# Patient Record
Sex: Male | Born: 1935 | Race: White | Hispanic: No | Marital: Married | Smoking: Former smoker
Health system: Southern US, Community
[De-identification: ages and names within clinical notes are randomized; demographics above are authoritative.]

## PROBLEM LIST (undated history)

## (undated) DIAGNOSIS — I499 Cardiac arrhythmia, unspecified: Secondary | ICD-10-CM

## (undated) DIAGNOSIS — N189 Chronic kidney disease, unspecified: Secondary | ICD-10-CM

## (undated) DIAGNOSIS — E119 Type 2 diabetes mellitus without complications: Secondary | ICD-10-CM

## (undated) DIAGNOSIS — Q2112 Patent foramen ovale: Secondary | ICD-10-CM

## (undated) DIAGNOSIS — J449 Chronic obstructive pulmonary disease, unspecified: Secondary | ICD-10-CM

## (undated) DIAGNOSIS — I517 Cardiomegaly: Secondary | ICD-10-CM

## (undated) DIAGNOSIS — E7800 Pure hypercholesterolemia, unspecified: Secondary | ICD-10-CM

## (undated) DIAGNOSIS — Q211 Atrial septal defect: Secondary | ICD-10-CM

## (undated) DIAGNOSIS — J439 Emphysema, unspecified: Secondary | ICD-10-CM

## (undated) DIAGNOSIS — I10 Essential (primary) hypertension: Secondary | ICD-10-CM

## (undated) DIAGNOSIS — C3490 Malignant neoplasm of unspecified part of unspecified bronchus or lung: Secondary | ICD-10-CM

## (undated) DIAGNOSIS — D649 Anemia, unspecified: Secondary | ICD-10-CM

## (undated) DIAGNOSIS — I2510 Atherosclerotic heart disease of native coronary artery without angina pectoris: Secondary | ICD-10-CM

## (undated) DIAGNOSIS — Z923 Personal history of irradiation: Secondary | ICD-10-CM

## (undated) DIAGNOSIS — K529 Noninfective gastroenteritis and colitis, unspecified: Secondary | ICD-10-CM

## (undated) DIAGNOSIS — N400 Enlarged prostate without lower urinary tract symptoms: Secondary | ICD-10-CM

## (undated) HISTORY — PX: CORONARY ANGIOPLASTY WITH STENT PLACEMENT: SHX49

## (undated) HISTORY — PX: CARDIAC CATHETERIZATION: SHX172

## (undated) HISTORY — DX: Pure hypercholesterolemia, unspecified: E78.00

## (undated) HISTORY — DX: Noninfective gastroenteritis and colitis, unspecified: K52.9

## (undated) HISTORY — DX: Personal history of irradiation: Z92.3

## (undated) HISTORY — PX: LUNG BIOPSY: SHX232

## (undated) HISTORY — DX: Chronic kidney disease, unspecified: N18.9

## (undated) HISTORY — DX: Emphysema, unspecified: J43.9

## (undated) HISTORY — DX: Chronic obstructive pulmonary disease, unspecified: J44.9

## (undated) HISTORY — PX: HEMORRHOID SURGERY: SHX153

## (undated) HISTORY — DX: Benign prostatic hyperplasia without lower urinary tract symptoms: N40.0

## (undated) HISTORY — DX: Essential (primary) hypertension: I10

---

## 1997-12-18 ENCOUNTER — Other Ambulatory Visit: Admission: RE | Admit: 1997-12-18 | Discharge: 1997-12-18 | Payer: Self-pay | Admitting: Family Medicine

## 2012-06-28 ENCOUNTER — Other Ambulatory Visit: Payer: Self-pay

## 2012-06-28 ENCOUNTER — Institutional Professional Consult (permissible substitution) (INDEPENDENT_AMBULATORY_CARE_PROVIDER_SITE_OTHER): Payer: PRIVATE HEALTH INSURANCE | Admitting: Cardiothoracic Surgery

## 2012-06-28 VITALS — BP 143/80 | HR 88 | Resp 20 | Ht 70.0 in | Wt 180.0 lb

## 2012-06-28 DIAGNOSIS — E1159 Type 2 diabetes mellitus with other circulatory complications: Secondary | ICD-10-CM | POA: Insufficient documentation

## 2012-06-28 DIAGNOSIS — C3490 Malignant neoplasm of unspecified part of unspecified bronchus or lung: Secondary | ICD-10-CM | POA: Insufficient documentation

## 2012-06-28 DIAGNOSIS — I119 Hypertensive heart disease without heart failure: Secondary | ICD-10-CM | POA: Insufficient documentation

## 2012-06-28 DIAGNOSIS — E7800 Pure hypercholesterolemia, unspecified: Secondary | ICD-10-CM | POA: Insufficient documentation

## 2012-06-28 DIAGNOSIS — J439 Emphysema, unspecified: Secondary | ICD-10-CM | POA: Insufficient documentation

## 2012-06-28 DIAGNOSIS — D381 Neoplasm of uncertain behavior of trachea, bronchus and lung: Secondary | ICD-10-CM

## 2012-06-28 DIAGNOSIS — N400 Enlarged prostate without lower urinary tract symptoms: Secondary | ICD-10-CM | POA: Insufficient documentation

## 2012-06-28 NOTE — Progress Notes (Signed)
[address]            [place] [address]          [phone]      [facility]andon [facility] Medical Record #[id number] Date of Birth: 1934-11-20  Referring: Weston Settle, MD Primary Care: [name], MD  Chief Complaint:    Chief Complaint  Patient presents with  . Lung Lesion    Referral from Dr Melvyn Neth for surgical eval on Left Upper Lobe lung nodule    History of Present Illness:    Patient is a 78 year old male referred by Dr. 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 On Christmas Day the patient while picking up sticks in his front yard had some onset of shortness of breath and fatigue and was taken to the [facility] emergency room. He was hospitalized for a week diagnosed with pneumonia, in addition a new left upper lobe lung mass was appreciated further workup was performed including a needle biopsy confirming squamous cell carcinoma. Ultimately the patient was discharged home on home oxygen and with respiratory treatments. He notes he is markedly improved at this point no longer using oxygen. He is referred for consideration of surgical resection.     Current Activity/ Functional Status: Patient is independent with mobility/ambulation, transfers, ADL's, IADL's. Zubron 0  Past Medical History  Diagnosis Date  . Hypertension   . Hypercholesterolemia   . Diabetes   . Benign prostatic hypertrophy   . Emphysema   . Lung nodule   . COPD (chronic obstructive pulmonary disease)   . CKD (chronic kidney disease)   . Colitis     Past Surgical History  Procedure Date  . Hemorrhoid surgery     Family History  Problem Relation Age of Onset  . Stroke Mother   . Heart disease Father     died of heart attack  . Lung cancer Brother   . Hypertension Brother   . Hypothyroidism Brother   . Hyperlipidemia Sister   . Hyperlipidemia Sister   . Breast  cancer Sister     History   Social History  . Marital Status: Married    Spouse Name: N/A    Number of Children: 1  . Years of Education: N/A   Occupational History  . retired [employer] for over 20 years  . retired     Personnel officer for 10 years   Social History Main Topics  . Smoking status: Former Smoker -- 0.5 packs/day for 50 years    Types: Cigarettes    Start date: 06/09/1958    Quit date: 06/10/2007  . Smokeless tobacco: Never Used  . Alcohol Use: No  . Drug Use: No  . Sexually Active: Not on file      History  Smoking status  . Former Smoker -- 0.5 packs/day for 50 years  . Types: Cigarettes  . Start date: 06/09/1958  . Quit date: 06/10/2007  Smokeless tobacco  . Never Used    History  Alcohol Use No     No Known Allergies  Current Outpatient Prescriptions  Medication Sig Dispense Refill  . carvedilol (COREG) 25 MG tablet Take 25 mg by mouth 2 (two) times daily with a meal.       . finasteride (PROSCAR) 5 MG tablet Take  5 mg by mouth daily.       Marland Kitchen lisinopril (PRINIVIL,ZESTRIL) 20 MG tablet Take 20 mg by mouth daily.       . metFORMIN (GLUCOPHAGE-XR) 500 MG 24 hr tablet Take 1,000 mg by mouth 2 (two) times daily.       . Tamsulosin HCl (FLOMAX) 0.4 MG CAPS 0.4 mg daily.            Review of Systems:     Cardiac Review of Systems: Y or N  Chest Pain [ vague not sure    ]  Resting SOB [ n  ] Exertional SOB  Cove.Etienne  ]  Orthopnea [ n ]   Pedal Edema [ y  ]    Palpitations Cove.Etienne  ] Syncope  [ n ]   Presyncope [ n  ]  General Review of Systems: [Y] = yes [  ]=no Constitional: recent weight change Milo.Brash  ]; anorexia [ n ]; fatigue Cove.Etienne  ]; nausea [n  ]; night sweats [n  ]; fever [ n ]; or chills [ n ];                                                                                                                                          Dental: poor dentition[ dentures ];   Eye : blurred vision [  ]; diplopia [   ]; vision changes [   ];  Amaurosis fugax[  ]; Resp: cough [ y ];  wheezing[y  ];  hemoptysis[n  ]; shortness of breath[y  ]; paroxysmal nocturnal dyspnea[  ]; dyspnea on exertion[ y ]; or orthopnea[  ];  GI:  gallstones[  ], vomiting[  ];  dysphagia[  ]; melena[  ];  hematochezia [  ]; heartburn[  ];   Hx of  Colonoscopy[y  ]; GU: kidney stones [  ]; hematuria[  ];   dysuria [n  ];  nocturia[  ];  history of     obstruction [  ];             Skin: rash, swelling[  ];, hair loss[  ];  peripheral edema[  ];  or itching[  ]; Musculosketetal: myalgias[  ];  joint swelling[  ];  joint erythema[  ];  joint pain[  ];  back pain[  ];  Heme/Lymph: bruising[  ];  bleeding[  ];  anemia[  ];  Neuro: TIA[  ];  headaches[  ];  stroke[  ];  vertigo[  ];  seizures[  ];   paresthesias[  ];  difficulty walking[  ];  Psych:depression[  ]; anxiety[  ];  Endocrine: diabetes[ y ];  thyroid dysfunction[  ];  Immunizations: Flu [ y ]; Pneumococcal[y  ];  Other:  Physical Exam: BP 143/80  Pulse 88  Resp 20  Ht 5\' 10"  (1.778 m)  Wt 180 lb (81.647 kg)  BMI  25.83 kg/m2  SpO2 91%  General appearance: alert, cooperative, appears stated age and no distress Neurologic: intact Heart: regular rate and rhythm, S1, S2 normal, no murmur, click, rub or gallop and normal apical impulse Lungs: diminished breath sounds bibasilar Abdomen: soft, non-tender; bowel sounds normal; no masses,  no organomegaly Extremities: edema 2+ pedal edema in both ankles, patient notes its worst and in the afternoons Do not appreciate any cervical or supraclavicular adenopathy he has no axillary adenopathy   Diagnostic Studies & Laboratory data:     Recent Radiology Findings:  Ct of chest , pet  , ct of head done at [facility] in [place] [facility] PET scan shows a 3.7 cm mass with an SUV of 9.7 in the left upper lobe abutting the pleura there is no evidence of hypermetabolic mediastinal nodes or other metastasis both on the PET scan and CT scan there is prominent  coronary calcifications also gallstones are present.  The scar area in the right upper lobe was not hypermetabolic .CT scan of the chest done 06/02/2012 was negative for pulmonary emboli a 4.1 cm mass in the anterior aspect of the left upper lobe with adjacent pneumonitis is noted there is a less well-defined spiculated mass in the right apex is enlarged bowel are hilar and mediastinal nodes as poorly defined asymmetric groundglass opacities throughout both lower lobes right greater than left there significant coronary artery calcifications.   A CT-guided needle biopsy of left upper lobe mass was done 06/07/2012 at [facility] number [id number] is interpreted by Dr. Ferne Reus has squamous cell carcinoma Recent Lab Findings: No results found for this basename: WBC, HGB, HCT, PLT, GLUCOSE, CHOL, TRIG, HDL, LDLDIRECT, LDLCALC, ALT, AST, NA, K, CL, CREATININE, BUN, CO2, TSH, INR, GLUF, HGBA1C      Assessment / Plan:      1 new diagnosis of stage clinical stage IB squamous cell carcinoma of the left upper lobe 2 Advanced emphysema full pulmonary function studies pending, has not been smoking for 4 years 3 History of diabetes 4 Recent admission for sudden onset of shortness of breath diagnosed as pneumonia and treated at [facility]  I have reviewed with the patient his wife and daughter the findings on CT scan and PET scan that are consistent with the tissue diagnosis of squamous cell carcinoma the lung involving the left. Ideally with stage IB carcinoma proceeding with lung resection will give the greatest chance of cure. Prior to proceeding with surgical resection will obtain a full set of pulmonary function studies, and because of the patient's high risk of coronary artery disease with positive family history long-standing diabetes and long-standing tobacco use we'll obtain cardiology consultation. With this information in hand I can give the patient a better perspective on surgical risk. History  this the above are completed I will see him back and we will likely plan to proceed with surgical resection.     Delight Ovens MD  Beeper [phone] Office [phone] 06/28/2012 3:26 PM

## 2012-06-28 NOTE — Patient Instructions (Signed)
Will need Pulmonary Lung Function Cardiology Clearance  Lung Cancer Lung cancer is a tumor which starts as a growth in your lungs. Cancer is a group of many related diseases that begin in cells, the building blocks of the body. Normally, cells grow and divide to produce more cells only when the body needs them. Sometimes cells keep dividing when new cells are not needed. These extra cells may form a mass of tissue called a growth or tumor. Tumors can be either benign (not cancerous) or malignant (cancerous). Cancer can begin in any organ or tissue of the body. The original tumor (where the tumor started out) is called the primary cancer and is usually named for where it begins.  Lung cancer is the most common cause of cancer death in men and women. There are several different types of lung cancers. Usually, lung cancer is described as either small-cell lung cancer or non-small-cell lung cancer. Other types of cancer occur in the lungs, including carcinoid and cancers spread from other organs. The types of cancer have different behavior and treatment. CAUSES  This cancer usually starts when the lungs are exposed to harmful chemicals. When you quit smoking, your risk of lung cancer falls each year (but is never the same as a person who has never smoked).  Other risks include:   Radon gas exposure.  Asbestos and other industrial substance exposure.  Second hand tobacco smoke.  Air pollution.  Family or personal history of lung cancer.  Age over 51. SYMPTOMS  Lung cancer can cause many symptoms. They depend on the type of cancer, its location and other factors. Symptoms of lung cancer can include:  Cough (either new, different or more severe).  Shortness of breath.  Coughing up blood (hemoptysis).  Chest pain.  Hoarseness.  Swelling of the face.  Drooping eyelid.  Changes in blood tests: low sodium (hyponatremia), high calcium (hypercalcemia) or low blood count (anemia).  Weight  loss. In its early stages, lung cancer may not have symptoms and can be discovered by accident. Many of the symptoms above can be caused by diseases other than lung cancer. DIAGNOSIS  In early lung cancer, the patient often does not notice problems. It usually has spread by the time problems are first noticed. Your caregiver may suspect lung cancer based on your symptoms, your exam or based on tests (such as x-rays) obtained for other reasons. Common tests that help your caregiver diagnose your condition include:  Chest x-ray.  CT scan of the lungs and chest.  Blood tests. If a tumor is found, a biopsy will be necessary to confirm that cancer is present and to determine the type of cancer. TREATMENT   Surgery offers a hope for a cure if the cancer has not spread and the cancer is not a small cell (oat cell) cancer of the lung. Surgery cannot cure the small cell type of cancer.  Radiation Therapy is a form of high energy X-ray that helps slow or kill the cancer. It is often used along with medications (chemotherapy) to help treat the cancer and control pain.  Chemotherapy is used in combination with surgery in advanced cancer. It is also used in all small cell cancers.  Many new treatments look promising.  Your caregiver can give you more information and discuss treatment options that are best for your type of cancer. HOME CARE INSTRUCTIONS   If you smoke, stop!  Take all medications as told.  Keep all appointments with your caregiver and other  specialists.  Ask your caregiver if you should see a cancer specialist, if that has not been arranged.  If you require oxygen or breathing equipment, be sure you know how to use it and who to call with questions.  Follow any special diet directions. If you have problems with appetite, ask your caregiver for help. SEEK MEDICAL CARE IF:   You have had a surgical procedure are you are having trouble recovering.  You have ongoing weight  loss.  You have decreased strength or energy past the point when your caregiver said you would feel better.  You develop nausea or lightheadedness.  You have pain that is not improving. SEEK IMMEDIATE MEDICAL CARE IF:   You cough up clotted blood or bright red blood.  Your pain is uncontrolled.  You develop new difficulty breathing or chest pain.  You develop swelling in one or both ankles or legs, or swelling in your face or neck.  You develop new headache or confusion. Document Released: 09/01/2000 Document Revised: 08/18/2011 Document Reviewed: 06/12/2008 [facility] Patient Information 2013 [employer], [place].  Lung Resection A lung resection is surgery to remove a lung. When an entire lung is removed, the procedure is called a pneumonectomy. When only part of a lung is removed, the procedure is called a lobectomy. A lung resection is typically done to get rid of a tumor or cancer. This surgery can help relieve some or all of your symptoms. The surgery can also help keep the problem from getting worse. It may provide the best chance for curing your disease. However, surgery may not necessarily cure lung cancer, if that is the problem. Most people need to stay in the hospital for several days after this procedure.  LET YOUR CAREGIVER KNOW ABOUT:  Allergies to food or medicine.  Medicines taken, including vitamins, herbs, eyedrops, over-the-counter medicines, and creams.  Use of steroids (by mouth or creams).  Previous problems with anesthetics or numbing medicines.  History of bleeding problems or blood clots.  Previous surgery.  Other health problems, including diabetes and kidney problems.  Possibility of pregnancy, if this applies. RISKS AND COMPLICATIONS  Lung resections have been done for many years with good results and few complications. However, all surgery is associated with possible risks. Some of these risks are:  Excessive bleeding.  Infection.  Inability to  breath without a ventilator.  Persistent shortness of breath.  Heart problems, including abnormal rhythms and a risk of heart attack or heart failure.  Blood clots.  Injury to a blood vessel.  Injury to a nerve.  Failure to heal properly.  Stroke.  Bronchopleural fistula. This is a small hole between one of the main breathing tubes and the lining of the lungs. BEFORE THE PROCEDURE  In order to prepare for surgery, your caregiver may ask for several tests to be done. These may include:  Blood tests.  Urine tests.  X-rays.  Imaging tests, such as CT scans, MRI scans, and PET scans. These tests are done to find the exact size and location of the tumor that will be removed.  Pulmonary function tests (PFTs). These are breathing tests to assess the function of your lungs before surgery and to decide how to best help your breathing after surgery.  Heart testing. This is done to make sure your heart is strong enough for the procedure.  Bronchoscopy. This is a technique that allows your caregiver to look at the inside of your airways. This is done using a soft, flexible tube (  bronchoscope). Along with imaging tests, this can help your caregiver know the exact location and size of the area that will be removed during surgery.  Lymph node sampling. This may need to be done to see if the tumor has spread. It may be done as a separate surgery or right before your lung resection procedure. PROCEDURE  An intravenous line (IV) will be placed in your arm. You will be given medicine that makes you sleep (general anesthetic).  Once you are asleep, a breathing tube is placed into your windpipe. You may also get pain medicine through a thin, flexible tube (catheter) in your back. The catheter is put through your skin and next to your spinal cord, where it releases anesthetic medicine.  Next, you will be turned onto your side. This makes it easier for your surgeon to reach the area of your ribcage  where the surgical cut (incision) will be made. This area is washed with a disinfectant solution and might also be shaved. A catheter will be put into your bladder to collect urine. Another tube will be carefully passed through your throat and into your stomach.  The surgeon will make an incision on your side, which will start between two of your ribs and go around to your back. Your ribs will be spread and held open. Part of one rib may be removed to make it easier for the surgeon to reach your lung.  Your surgeon will carefully cut the veins, arteries, and bronchus leading to the lung. After being cut, each of these pieces will be sewn or stapled closed. Then, the lung or part of the lung will be removed.  Your surgeon will check inside your chest to make sure there is no bleeding in or around the lungs. Lymph nodes near the lung may also be removed for later tests. This is done to check if your problems have spread to the lymph nodes.  Depending on your situation, your surgeon may put tubes into your chest to drain extra fluid and air from the chest cavity after surgery. After the tubes are in, your ribcage will be closed with stitches. The stitches help your ribcage heal and keep it from moving. After this, the layers of tissue under the skin are closed with more stitches, which will dissolve inside your body over time. Finally, your skin is closed with stitches or staples and covered with a bandage. AFTER THE PROCEDURE   After surgery, you will be taken to the recovery area where a nurse will monitor your progress. You may still have a breathing tube, spinal catheter, bladder catheter, stomach tube, and possibly chest tubes inside your body. These will be removed during your recovery. You may be put on a respirator following surgery if some assistance is needed to help your breathing. When you are awake, stable, and without complications, you will likely continue recovery in the intensive care unit  (ICU).  As you wake up, you might feel some aches and pains in your chest and throat. Sometimes during recovery, patients may shiver or feel nauseous. Both of these symptoms are temporary and may be caused by the anesthesia. Your caregivers can give you medicine to help these problems go away.  The breathing tube will be taken out as soon as your caregivers feel you can breathe on your own. For most people, this happens on the same day as the surgery.  If your surgery and time in the ICU go well, most of the tubes and  equipment will be taken out within the first 1 to 2 days after surgery. This is about how long most people stay in the ICU. You may need to stay longer, depending on how you are doing.  You should also start respiratory therapy in the ICU. This therapy uses breathing exercises to help your other lung stay healthy and get stronger.  As you improve, you will be moved to a regular hospital room for continued respiratory therapy, help with your bladder and bowels, and to continue medicines. Most people stay in the hospital for 5 to 7 days. However, your stay may be longer, depending on how your surgery went and how well you are doing.  After your lung or part of your lung is taken out, there will be a space inside your chest. This space will often fill up with fluid over time. The amount of time this takes is different for each person. Because your chest needs to fill with fluid, your surgeon may or may not put a drainage tube in your chest. If there is a chest tube, it will most likely be removed within 24 hours after the surgery.  You will receive care until you are doing well and your caregiver feels it is safe for you to go home or to transfer to an extended care facility. Document Released: 08/16/2002 Document Revised: 08/18/2011 Document Reviewed: 01/23/2011 [facility] Patient Information 2013 [place], [place]. Lung Resection Care After Refer to this sheet in the next few weeks. These  instructions provide you with information on caring for yourself after your procedure. Your caregiver may also give you more specific instructions. Your treatment has been planned according to current medical practices, but problems sometimes occur. Call your caregiver if you have any problems or questions after your procedure. HOME CARE INSTRUCTIONS  You may resume a normal diet and activities as directed.  Do not smoke or use tobacco products.  Change your bandages (dressings) as directed.  Only take over-the-counter or prescription medicines for pain, discomfort, or fever as directed by your caregiver.  Keep all follow-up appointments as directed.  Try to breathe deeply and cough as directed. Holding a pillow firmly over your ribs may help with discomfort.  If you were given an incentive spirometer in the hospital, continue to use it as directed.  Walk as directed by your caregiver.  You may take a shower and gently wash the area of your surgical cut (incision) with water and soap as directed. Do not use anything else to clean your incision except as directed by your caregiver. Do not take baths or sit in a hot tub. SEEK MEDICAL CARE IF:  You notice redness, swelling, or increasing pain in the incision.  You are bleeding from the incision.  You see pus coming from the incision.  You notice a bad smell coming from the incision or dressing.  Your incision breaks open.  You cough up blood or pus, or you develop a cough that produces bad smelling sputum.  You have pain or swelling in your legs.  You have increasing pain that is not controlled with medicine.  You have trouble managing any of the tubes that have been left in place after surgery. SEEK IMMEDIATE MEDICAL CARE IF:   You have a fever or chills.  You have any reaction or side effects to medicines given.  You have chest pain or an irregular or rapid heartbeat.  You have dizzy episodes or fainting.  You have  shortness of breath  or difficulty breathing.  You have persistent nausea or vomiting.  You have a rash. MAKE SURE YOU:  Understand these instructions.  Will watch your condition.  Will get help right away if you are not doing well or get worse. Document Released: 12/13/2004 Document Revised: 08/18/2011 Document Reviewed: 01/23/2011 [facility] Patient Information 2013 [place], [place].

## 2012-06-29 ENCOUNTER — Encounter: Payer: Self-pay | Admitting: Cardiovascular Disease

## 2012-06-29 ENCOUNTER — Ambulatory Visit (INDEPENDENT_AMBULATORY_CARE_PROVIDER_SITE_OTHER): Payer: PRIVATE HEALTH INSURANCE | Admitting: Cardiovascular Disease

## 2012-06-29 ENCOUNTER — Ambulatory Visit (HOSPITAL_COMMUNITY)
Admission: RE | Admit: 2012-06-29 | Discharge: 2012-06-29 | Disposition: A | Discharge status: Home / Self Care | Payer: Medicare Other | Source: Ambulatory Visit | Attending: Cardiothoracic Surgery | Admitting: Cardiothoracic Surgery

## 2012-06-29 VITALS — BP 124/80 | HR 82 | Ht 70.0 in | Wt 187.0 lb

## 2012-06-29 DIAGNOSIS — E7800 Pure hypercholesterolemia, unspecified: Secondary | ICD-10-CM

## 2012-06-29 DIAGNOSIS — J449 Chronic obstructive pulmonary disease, unspecified: Secondary | ICD-10-CM

## 2012-06-29 DIAGNOSIS — Z01810 Encounter for preprocedural cardiovascular examination: Secondary | ICD-10-CM

## 2012-06-29 DIAGNOSIS — I10 Essential (primary) hypertension: Secondary | ICD-10-CM

## 2012-06-29 DIAGNOSIS — Z01818 Encounter for other preprocedural examination: Secondary | ICD-10-CM

## 2012-06-29 DIAGNOSIS — D381 Neoplasm of uncertain behavior of trachea, bronchus and lung: Secondary | ICD-10-CM | POA: Insufficient documentation

## 2012-06-29 DIAGNOSIS — E119 Type 2 diabetes mellitus without complications: Secondary | ICD-10-CM

## 2012-06-29 LAB — PULMONARY FUNCTION TEST

## 2012-06-29 MED ORDER — ALBUTEROL SULFATE (5 MG/ML) 0.5% IN NEBU
2.5000 mg | INHALATION_SOLUTION | Freq: Once | RESPIRATORY_TRACT | Status: AC
  Administered 2012-06-29: 2.5 mg via RESPIRATORY_TRACT

## 2012-06-29 NOTE — Assessment & Plan Note (Signed)
Cholesterol is at goal.  Continue current dose of statin and diet Rx.  No myalgias or side effects.  F/U  LFT's in 6 months. No results found for this basename: LDLCALC             

## 2012-06-29 NOTE — Progress Notes (Signed)
Patient ID: Brandon Clay, male   DOB: 10/06/1934, 78 y.o.   MRN: [id number] 78 yo referred by Dr Gerhardt for clearance  Has multiple CRF;s and lung cancer needing lobectomy. Sees Dr Lewis in [place] Quit smoking in 2009 but has emphysema  Recent hospitalization in [place] with pneumonia and cancer found.  No documented CAD  HTN controlled with meds.  IDDM for over 10 years.  Mild exertional dyspnea No chest pain.  Activity level is reasonable  No bleeding diathesis or previous anesthetic issues  ROS: Denies fever, malais, weight loss, blurry vision, decreased visual acuity, cough, sputum, SOB, hemoptysis, pleuritic pain, palpitaitons, heartburn, abdominal pain, melena, lower extremity edema, claudication, or rash.  All other systems reviewed and negative   General: Affect appropriate Healthy:  appears stated age HEENT: normal Neck supple with no adenopathy JVP normal no bruits no thyromegaly Lungs clear with no wheezing and good diaphragmatic motion Heart:  S1/S2 no murmur,rub, gallop or click PMI normal Abdomen: benighn, BS positve, no tenderness, no AAA no bruit.  No HSM or HJR Distal pulses intact with no bruits No edema Neuro non-focal Skin warm and dry No muscular weakness  Medications Current Outpatient Prescriptions  Medication Sig Dispense Refill  . aspirin 81 MG tablet Take 81 mg by mouth every other day.      . carvedilol (COREG) 25 MG tablet Take 25 mg by mouth 2 (two) times daily with a meal.       . finasteride (PROSCAR) 5 MG tablet Take 5 mg by mouth daily.       . lisinopril (PRINIVIL,ZESTRIL) 20 MG tablet Take 20 mg by mouth daily.       . metFORMIN (GLUCOPHAGE-XR) 500 MG 24 hr tablet Take 1,000 mg by mouth 2 (two) times daily.       . Tamsulosin HCl (FLOMAX) 0.4 MG CAPS 0.4 mg daily.         Allergies Review of patient's allergies indicates no known allergies.  Family History: Family History  Problem Relation Age of Onset  . Stroke Mother   . Heart disease  Father     died of heart attack  . Lung cancer Brother   . Hypertension Brother   . Hypothyroidism Brother   . Hyperlipidemia Sister   . Hyperlipidemia Sister   . Breast cancer Sister     Social History: History   Social History  . Marital Status: Married    Spouse Name: N/A    Number of Children: 1  . Years of Education: N/A   Occupational History  . 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 retired     electrician for 10 years   Social History Main Topics  . Smoking status: Former Smoker -- 0.5 packs/day for 50 years    Types: Cigarettes    Start date: 06/09/1958    Quit date: 06/10/2007  . Smokeless tobacco: Never Used  . Alcohol Use: No  . Drug Use: No  . Sexually Active: Not on file   Other Topics Concern  . Not on file   Social History Narrative  . No narrative on file    Electrocardiogram:  SR rate 82  ? Old IMI lateral T wave changes  Assessment and Plan   

## 2012-06-29 NOTE — Assessment & Plan Note (Signed)
Discussed low carb diet.  Target hemoglobin A1c is 6.5 or less.  Continue current medications.  

## 2012-06-29 NOTE — Assessment & Plan Note (Signed)
No active wheezing  PFT;s latter today to see if lobectomy feasible

## 2012-06-29 NOTE — Patient Instructions (Signed)
Your physician recommends that you schedule a follow-up appointment in:  AS NEEDED  Your physician recommends that you continue on your current medications as directed. Please refer to the Current Medication list given to you today.  Your physician has requested that you have en exercise stress myoview. For further information please visit [email or web address]. Please follow instruction sheet, as given. DX PRE OP  

## 2012-06-29 NOTE — Assessment & Plan Note (Signed)
Well controlled.  Continue current medications and low sodium Dash type diet.    

## 2012-06-29 NOTE — Assessment & Plan Note (Signed)
IDDM with moderate risk surgery and abnormal ECG  F/U stress myovue

## 2012-06-30 ENCOUNTER — Ambulatory Visit (HOSPITAL_COMMUNITY): Payer: Medicare Other | Attending: Cardiovascular Disease | Admitting: Radiology

## 2012-06-30 VITALS — BP 105/85 | HR 68 | Ht 70.0 in | Wt 175.0 lb

## 2012-06-30 DIAGNOSIS — R0602 Shortness of breath: Secondary | ICD-10-CM

## 2012-06-30 DIAGNOSIS — R0989 Other specified symptoms and signs involving the circulatory and respiratory systems: Secondary | ICD-10-CM | POA: Insufficient documentation

## 2012-06-30 DIAGNOSIS — I10 Essential (primary) hypertension: Secondary | ICD-10-CM | POA: Insufficient documentation

## 2012-06-30 DIAGNOSIS — Z01818 Encounter for other preprocedural examination: Secondary | ICD-10-CM

## 2012-06-30 DIAGNOSIS — R0609 Other forms of dyspnea: Secondary | ICD-10-CM | POA: Insufficient documentation

## 2012-06-30 DIAGNOSIS — E119 Type 2 diabetes mellitus without complications: Secondary | ICD-10-CM | POA: Insufficient documentation

## 2012-06-30 DIAGNOSIS — R9431 Abnormal electrocardiogram [ECG] [EKG]: Secondary | ICD-10-CM

## 2012-06-30 MED ORDER — TECHNETIUM TC 99M SESTAMIBI GENERIC - CARDIOLITE
11.0000 | Freq: Once | INTRAVENOUS | Status: AC | PRN
  Administered 2012-06-30: 11 via INTRAVENOUS

## 2012-06-30 MED ORDER — TECHNETIUM TC 99M SESTAMIBI GENERIC - CARDIOLITE
33.0000 | Freq: Once | INTRAVENOUS | Status: AC | PRN
  Administered 2012-06-30: 33 via INTRAVENOUS

## 2012-06-30 MED ORDER — REGADENOSON 0.4 MG/5ML IV SOLN
0.4000 mg | Freq: Once | INTRAVENOUS | Status: AC
  Administered 2012-06-30: 0.4 mg via INTRAVENOUS

## 2012-06-30 NOTE — Progress Notes (Signed)
[facility] 3 NUCLEAR MED [address] [place], [place] [address] [phone]    Cardiology Nuclear Med Study  Brandon Clay is a 78 y.o. male     MRN : [id number]     DOB: Sep 18, 1934  Procedure Date: 06/30/2012  Nuclear Med Background Indication for Stress Test:  Evaluation for Ischemia, Abnormal EKG and Pending Surgical Clearance for Lobectomy with Dr. Fredda Hammed History:  ~15 GXT:OK per patient. Cardiac Risk Factors: Family History - CAD, History of Smoking, Hypertension, Lipids and NIDDM  Symptoms:  DOE and Fatigue   Nuclear Pre-Procedure Caffeine/Decaff Intake:  None NPO After: 7:00am   Lungs:  Clear. O2 Sat: 96% on room air. IV 0.9% NS with Angio Cath:  22g  IV Site: L Antecubital  IV Started by:  Milana Na, EMT-P  Chest Size (in):  40/42 Cup Size: n/a  Height: 5\' 10"  (1.778 m)  Weight:  175 lb (79.379 kg)  BMI:  Body mass index is 25.11 kg/(m^2). Tech Comments:  Took med's this am. CBG 98 mg/dl    Nuclear Med Study 1 or 2 day study: 1 day  Stress Test Type:  Lexiscan  Reading MD: Charlton Haws, MD  Order Authorizing Provider:  Charlton Haws, MD  Resting Radionuclide: Technetium 47m Sestamibi  Resting Radionuclide Dose: 11.0 mCi   Stress Radionuclide:  Technetium 80m Sestamibi  Stress Radionuclide Dose: 33.0 mCi           Stress Protocol Rest HR: 68 Stress HR: 93  Rest BP: 105/85 Stress BP: 106/89  Exercise Time (min): n/a METS: n/a   Predicted Max HR: 145 bpm % Max HR: 64.14 bpm Rate Pressure Product: 9858    Dose of Adenosine (mg):  n/a Dose of Lexiscan: 0.4 mg  Dose of Atropine (mg): n/a Dose of Dobutamine: n/a mcg/kg/min (at max HR)  Stress Test Technologist: Smiley Houseman, CMA-N  Nuclear Technologist:  Domenic Polite, CNMT     Rest Procedure:  Myocardial perfusion imaging was performed at rest 45 minutes following the intravenous administration of Technetium 22m Sestamibi.  Rest ECG: NSR old inferolateral wall  infarct  Stress Procedure:  The patient received IV Lexiscan 0.4 mg over 15-seconds.  Technetium 19m Sestamibi injected at 30-seconds.  Patient denied any chest pain with Lexiscan.  Quantitative spect images were obtained after a 45 minute delay.  Stress ECG: No significant change from baseline ECG  QPS Raw Data Images:  Normal; no motion artifact; normal heart/lung ratio. Stress Images:  Decreased lateral and inferolateral wall uptake Rest Images:  Decreased lateral and inferolateral wall uptake Subtraction (SDS):  SDS 6 abnormal in lateral wall Transient Ischemic Dilatation (Normal <1.22):  1.13 Lung/Heart Ratio (Normal <0.45):  0.38  Quantitative Gated Spect Images QGS EDV:  139 ml QGS ESV:  76 ml  Impression Exercise Capacity:  Lexiscan with no exercise. BP Response:  Normal blood pressure response. Clinical Symptoms:  No significant symptoms noted. ECG Impression:  No significant ST segment change suggestive of ischemia. Comparison with Prior Nuclear Study: No previous nuclear study performed  Overall Impression:  Intermediate stress nuclear study. Large lateral and inferolateral wall infarct with mild peri infarct ischemia   LV Ejection Fraction: 45%.  LV Wall Motion:  Inferior and lateral wall hypokinesis   Charlton Haws

## 2012-07-01 ENCOUNTER — Ambulatory Visit (INDEPENDENT_AMBULATORY_CARE_PROVIDER_SITE_OTHER): Payer: Medicare Other | Admitting: Cardiothoracic Surgery

## 2012-07-01 ENCOUNTER — Other Ambulatory Visit (INDEPENDENT_AMBULATORY_CARE_PROVIDER_SITE_OTHER): Payer: Medicare Other

## 2012-07-01 ENCOUNTER — Other Ambulatory Visit: Payer: Self-pay | Admitting: *Deleted

## 2012-07-01 ENCOUNTER — Encounter: Payer: Self-pay | Admitting: *Deleted

## 2012-07-01 ENCOUNTER — Encounter: Payer: Self-pay | Admitting: Cardiothoracic Surgery

## 2012-07-01 ENCOUNTER — Other Ambulatory Visit: Payer: Self-pay | Admitting: Cardiovascular Disease

## 2012-07-01 VITALS — BP 149/82 | HR 60 | Resp 18 | Ht 70.0 in | Wt 180.0 lb

## 2012-07-01 DIAGNOSIS — R9430 Abnormal result of cardiovascular function study, unspecified: Secondary | ICD-10-CM

## 2012-07-01 DIAGNOSIS — Z01810 Encounter for preprocedural cardiovascular examination: Secondary | ICD-10-CM

## 2012-07-01 DIAGNOSIS — I2510 Atherosclerotic heart disease of native coronary artery without angina pectoris: Secondary | ICD-10-CM

## 2012-07-01 DIAGNOSIS — J984 Other disorders of lung: Secondary | ICD-10-CM

## 2012-07-01 DIAGNOSIS — R911 Solitary pulmonary nodule: Secondary | ICD-10-CM

## 2012-07-01 DIAGNOSIS — C3490 Malignant neoplasm of unspecified part of unspecified bronchus or lung: Secondary | ICD-10-CM

## 2012-07-01 LAB — CBC WITH DIFFERENTIAL/PLATELET
Basophils Absolute: 0 10*3/uL (ref 0.0–0.1)
Basophils Relative: 0.3 % (ref 0.0–3.0)
Eosinophils Absolute: 0.1 10*3/uL (ref 0.0–0.7)
Lymphocytes Relative: 20.2 % (ref 12.0–46.0)
MCHC: 33 g/dL (ref 30.0–36.0)
MCV: 85.5 fl (ref 78.0–100.0)
Monocytes Absolute: 0.8 10*3/uL (ref 0.1–1.0)
Neutrophils Relative %: 60.6 % (ref 43.0–77.0)
Platelets: 162 10*3/uL (ref 150.0–400.0)
RDW: 16.4 % — ABNORMAL HIGH (ref 11.5–14.6)

## 2012-07-01 LAB — BASIC METABOLIC PANEL
BUN: 16 mg/dL (ref 6–23)
CO2: 25 mEq/L (ref 19–32)
Calcium: 8.8 mg/dL (ref 8.4–10.5)
Chloride: 104 mEq/L (ref 96–112)
Creatinine, Ser: 1.2 mg/dL (ref 0.4–1.5)
Glucose, Bld: 228 mg/dL — ABNORMAL HIGH (ref 70–99)

## 2012-07-01 LAB — PROTIME-INR: INR: 1.1 ratio — ABNORMAL HIGH (ref 0.8–1.0)

## 2012-07-02 ENCOUNTER — Inpatient Hospital Stay (HOSPITAL_BASED_OUTPATIENT_CLINIC_OR_DEPARTMENT_OTHER)
Admission: RE | Admit: 2012-07-02 | Discharge: 2012-07-02 | Disposition: A | Discharge status: Home / Self Care | Payer: Medicare Other | Source: Ambulatory Visit | Attending: Cardiovascular Disease | Admitting: Cardiovascular Disease

## 2012-07-02 ENCOUNTER — Encounter (HOSPITAL_COMMUNITY): Payer: Self-pay | Admitting: Pharmacy Technician

## 2012-07-02 ENCOUNTER — Encounter (HOSPITAL_BASED_OUTPATIENT_CLINIC_OR_DEPARTMENT_OTHER)
Admission: RE | Disposition: A | Discharge status: Home / Self Care | Payer: Self-pay | Source: Ambulatory Visit | Attending: Cardiovascular Disease

## 2012-07-02 DIAGNOSIS — I2510 Atherosclerotic heart disease of native coronary artery without angina pectoris: Secondary | ICD-10-CM

## 2012-07-02 DIAGNOSIS — Z79899 Other long term (current) drug therapy: Secondary | ICD-10-CM | POA: Insufficient documentation

## 2012-07-02 DIAGNOSIS — Z87891 Personal history of nicotine dependence: Secondary | ICD-10-CM | POA: Insufficient documentation

## 2012-07-02 DIAGNOSIS — I10 Essential (primary) hypertension: Secondary | ICD-10-CM | POA: Insufficient documentation

## 2012-07-02 DIAGNOSIS — E119 Type 2 diabetes mellitus without complications: Secondary | ICD-10-CM | POA: Insufficient documentation

## 2012-07-02 DIAGNOSIS — C3490 Malignant neoplasm of unspecified part of unspecified bronchus or lung: Secondary | ICD-10-CM | POA: Insufficient documentation

## 2012-07-02 DIAGNOSIS — Z7982 Long term (current) use of aspirin: Secondary | ICD-10-CM | POA: Insufficient documentation

## 2012-07-02 SURGERY — JV LEFT HEART CATHETERIZATION WITH CORONARY ANGIOGRAM

## 2012-07-02 MED ORDER — ONDANSETRON HCL 4 MG/2ML IJ SOLN: 4.0000 mg | Freq: Four times a day (QID) | INTRAMUSCULAR | Status: DC | PRN

## 2012-07-02 MED ORDER — ACETAMINOPHEN 325 MG PO TABS: 650.0000 mg | ORAL_TABLET | ORAL | Status: DC | PRN

## 2012-07-02 MED ORDER — ASPIRIN 81 MG PO CHEW
324.0000 mg | CHEWABLE_TABLET | ORAL | Status: AC
  Administered 2012-07-02: 324 mg via ORAL

## 2012-07-02 MED ORDER — SODIUM CHLORIDE 0.9 % IV SOLN
250.0000 mL | INTRAVENOUS | Status: DC | PRN
  Administered 2012-07-02: 250 mL via INTRAVENOUS

## 2012-07-02 MED ORDER — SODIUM CHLORIDE 0.9 % IJ SOLN: 3.0000 mL | Freq: Two times a day (BID) | INTRAMUSCULAR | Status: DC

## 2012-07-02 MED ORDER — SODIUM CHLORIDE 0.9 % IJ SOLN: 3.0000 mL | INTRAMUSCULAR | Status: DC | PRN

## 2012-07-02 MED ORDER — SODIUM CHLORIDE 0.9 % IV SOLN: INTRAVENOUS | Status: AC

## 2012-07-02 NOTE — Interval H&P Note (Signed)
History and Physical Interval Note:  07/02/2012 9:35 AM  Brandon Clay  has presented today for cardiac cath  with the diagnosis of abnormal myoview.  The various methods of treatment have been discussed with the patient and family. After consideration of risks, benefits and other options for treatment, the patient has consented to  Procedure(s) (LRB) with comments: JV LEFT HEART CATHETERIZATION WITH CORONARY ANGIOGRAM (N/A) as a surgical intervention .  The patient's history has been reviewed, patient examined, no change in status, stable for surgery.  I have reviewed the patient's chart and labs.  Questions were answered to the patient's satisfaction.     Deirdre Gryder

## 2012-07-02 NOTE — Progress Notes (Signed)
Allen's test performed on right hand with abnormal results.

## 2012-07-02 NOTE — Progress Notes (Signed)
Bedrest begins @ 1015, tegaderm and pressure dressing applied to right groin site by Venda Rodes, site level 0.

## 2012-07-02 NOTE — CV Procedure (Signed)
   Cardiac Catheterization Operative Report  Brandon Clay [id number] 1/24/201410:03 AM [name], MD  Procedure Performed:  1. Left Heart Catheterization 2. Selective Coronary Angiography 3. Left ventricular angiogram  Operator: Verne Carrow, MD  Indication:  78 yo male with history of tobacco abuse,  DM, HTN and recent diagnosis of lung cancer who is referred today after an abnormal myoview suggesting inferior and lateral scar with possible ischemia. There is planning in place for lobectomy per Dr. [name].                                    Procedure Details: The risks, benefits, complications, treatment options, and expected outcomes were discussed with the patient. The patient and/or family concurred with the proposed plan, giving informed consent. The patient was brought to the cath lab after IV hydration was begun and oral premedication was given. The patient was further sedated with Versed and Fentanyl. The right groin was prepped and draped in the usual manner. Using the modified Seldinger access technique, a 4 French sheath was placed in the right femoral artery. Standard diagnostic catheters were used to perform selective coronary angiography. The sheath was upsized to a 5 [place] system and a JL-4 catheter was used to engage the left main and perform selective angiogram of the left coronary system.  A pigtail catheter was used to perform a left ventricular angiogram.  There were no immediate complications. The patient was taken to the recovery area in stable condition.   Hemodynamic Findings: Central aortic pressure: 134/49 Left ventricular pressure: 134/15/23  Angiographic Findings:  Left main: Distal 20% stenosis.   Left Anterior Descending Artery: Large caliber vessel that courses to the apex. The proximal vessel has diffuse plaque disease. The mid vessel has a 60% stenosis just before the diagonal branch. The first diagonal branch is small in  caliber and has diffuse disease. The second diagonal is small in caliber and has 99% stenosis.   Circumflex Artery: Large caliber vessel with 99% proximal stenosis. The distal AV groove Circumflex has diffuse 40% stenosis.   Right Coronary Artery: Large dominant vessel with 80% diffuse stenosis proximal vessel. The mid vessel has a 100% stenosis. The distal vessel fills from left to right collaterals.   Left Ventricular Angiogram: LVEF=45%. Inferior wall hypokinesis.   Impression: 1. 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  2. Segmental LV systolic dysfunction  Recommendations: Complex situation in patient with lung cancer and new diagnosis of triple vessel CAD. His RCA is chronically occluded. The proximal Circumflex has a severe stenosis which could be treated percutaneously however this will need to be discussed with Dr. [name] and Dr. Eden Emms in regards to planning the timing with lobectomy. Will discuss possibility of CABG versus stenting of the Circumflex with a bare metal stent which would require one month of dual anti-platelet therapy. He will go home today and further planning will be done as an outpatient.        Complications:  None. The patient tolerated the procedure well.

## 2012-07-02 NOTE — H&P (View-Only) (Signed)
Patient ID: Brandon Clay, male   DOB: [birth year], 78 y.o.   MRN: [id number] 78 yo referred by Dr [name] for clearance  Has multiple CRF;s and lung cancer needing lobectomy. Sees Dr Melvyn Neth in [place] Quit smoking in 2009 but has emphysema  Recent hospitalization in [place] with pneumonia and cancer found.  No documented CAD  HTN controlled with meds.  IDDM for over 10 years.  Mild exertional dyspnea No chest pain.  Activity level is reasonable  No bleeding diathesis or previous anesthetic issues  ROS: Denies fever, malais, weight loss, blurry vision, decreased visual acuity, cough, sputum, SOB, hemoptysis, pleuritic pain, palpitaitons, heartburn, abdominal pain, melena, lower extremity edema, claudication, or rash.  All other systems reviewed and negative   General: Affect appropriate Healthy:  appears stated age HEENT: normal Neck supple with no adenopathy JVP normal no bruits no thyromegaly Lungs clear with no wheezing and good diaphragmatic motion Heart:  S1/S2 no murmur,rub, gallop or click PMI normal Abdomen: benighn, BS positve, no tenderness, no AAA no bruit.  No HSM or HJR Distal pulses intact with no bruits No edema Neuro non-focal Skin warm and dry No muscular weakness  Medications Current Outpatient Prescriptions  Medication Sig Dispense Refill  . aspirin 81 MG tablet Take 81 mg by mouth every other day.      . carvedilol (COREG) 25 MG tablet Take 25 mg by mouth 2 (two) times daily with a meal.       . finasteride (PROSCAR) 5 MG tablet Take 5 mg by mouth daily.       Marland Kitchen lisinopril (PRINIVIL,ZESTRIL) 20 MG tablet Take 20 mg by mouth daily.       . metFORMIN (GLUCOPHAGE-XR) 500 MG 24 hr tablet Take 1,000 mg by mouth 2 (two) times daily.       . Tamsulosin HCl (FLOMAX) 0.4 MG CAPS 0.4 mg daily.         Allergies Review of patient's allergies indicates no known allergies.  Family History: Family History  Problem Relation Age of Onset  . Stroke Mother   . Heart disease  Father     died of heart attack  . Lung cancer Brother   . Hypertension Brother   . Hypothyroidism Brother   . Hyperlipidemia Sister   . Hyperlipidemia Sister   . Breast cancer Sister     Social History: History   Social History  . Marital Status: Married    Spouse Name: N/A    Number of Children: 1  . Years of Education: N/A   Occupational History  . retired [employer] for over 20 years  . retired     Personnel officer for 10 years   Social History Main Topics  . Smoking status: Former Smoker -- 0.5 packs/day for 50 years    Types: Cigarettes    Start date: 06/09/1958    Quit date: 06/10/2007  . Smokeless tobacco: Never Used  . Alcohol Use: No  . Drug Use: No  . Sexually Active: Not on file   Other Topics Concern  . Not on file   Social History Narrative  . No narrative on file    Electrocardiogram:  SR rate 82  ? Old IMI lateral T wave changes  Assessment and Plan

## 2012-07-04 ENCOUNTER — Emergency Department (HOSPITAL_COMMUNITY): Payer: Medicare Other

## 2012-07-04 ENCOUNTER — Emergency Department (HOSPITAL_COMMUNITY)
Admission: EM | Admit: 2012-07-04 | Discharge: 2012-07-04 | Disposition: A | Discharge status: Home / Self Care | Payer: Medicare Other | Attending: Emergency Medicine | Admitting: Emergency Medicine

## 2012-07-04 ENCOUNTER — Encounter (HOSPITAL_COMMUNITY): Payer: Self-pay | Admitting: Emergency Medicine

## 2012-07-04 DIAGNOSIS — J4489 Other specified chronic obstructive pulmonary disease: Secondary | ICD-10-CM | POA: Insufficient documentation

## 2012-07-04 DIAGNOSIS — E119 Type 2 diabetes mellitus without complications: Secondary | ICD-10-CM | POA: Insufficient documentation

## 2012-07-04 DIAGNOSIS — Z87891 Personal history of nicotine dependence: Secondary | ICD-10-CM | POA: Insufficient documentation

## 2012-07-04 DIAGNOSIS — R197 Diarrhea, unspecified: Secondary | ICD-10-CM | POA: Insufficient documentation

## 2012-07-04 DIAGNOSIS — E7800 Pure hypercholesterolemia, unspecified: Secondary | ICD-10-CM | POA: Insufficient documentation

## 2012-07-04 DIAGNOSIS — Z79899 Other long term (current) drug therapy: Secondary | ICD-10-CM | POA: Insufficient documentation

## 2012-07-04 DIAGNOSIS — N189 Chronic kidney disease, unspecified: Secondary | ICD-10-CM | POA: Insufficient documentation

## 2012-07-04 DIAGNOSIS — I129 Hypertensive chronic kidney disease with stage 1 through stage 4 chronic kidney disease, or unspecified chronic kidney disease: Secondary | ICD-10-CM | POA: Insufficient documentation

## 2012-07-04 DIAGNOSIS — J438 Other emphysema: Secondary | ICD-10-CM | POA: Insufficient documentation

## 2012-07-04 DIAGNOSIS — J449 Chronic obstructive pulmonary disease, unspecified: Secondary | ICD-10-CM | POA: Insufficient documentation

## 2012-07-04 DIAGNOSIS — Z7982 Long term (current) use of aspirin: Secondary | ICD-10-CM | POA: Insufficient documentation

## 2012-07-04 DIAGNOSIS — Z792 Long term (current) use of antibiotics: Secondary | ICD-10-CM | POA: Insufficient documentation

## 2012-07-04 DIAGNOSIS — N400 Enlarged prostate without lower urinary tract symptoms: Secondary | ICD-10-CM | POA: Insufficient documentation

## 2012-07-04 DIAGNOSIS — K529 Noninfective gastroenteritis and colitis, unspecified: Secondary | ICD-10-CM

## 2012-07-04 DIAGNOSIS — K5289 Other specified noninfective gastroenteritis and colitis: Secondary | ICD-10-CM | POA: Insufficient documentation

## 2012-07-04 LAB — CBC WITH DIFFERENTIAL/PLATELET
Eosinophils Absolute: 0 10*3/uL (ref 0.0–0.7)
Eosinophils Relative: 0 % (ref 0–5)
HCT: 35.2 % — ABNORMAL LOW (ref 39.0–52.0)
Hemoglobin: 12.1 g/dL — ABNORMAL LOW (ref 13.0–17.0)
Lymphocytes Relative: 6 % — ABNORMAL LOW (ref 12–46)
Lymphs Abs: 0.9 10*3/uL (ref 0.7–4.0)
MCH: 28.3 pg (ref 26.0–34.0)
MCV: 82.4 fL (ref 78.0–100.0)
Monocytes Relative: 9 % (ref 3–12)
Platelets: 168 10*3/uL (ref 150–400)
RBC: 4.27 MIL/uL (ref 4.22–5.81)
WBC: 15 10*3/uL — ABNORMAL HIGH (ref 4.0–10.5)

## 2012-07-04 LAB — COMPREHENSIVE METABOLIC PANEL
ALT: 9 U/L (ref 0–53)
Alkaline Phosphatase: 75 U/L (ref 39–117)
BUN: 13 mg/dL (ref 6–23)
CO2: 21 mEq/L (ref 19–32)
Calcium: 9.1 mg/dL (ref 8.4–10.5)
GFR calc Af Amer: 74 mL/min — ABNORMAL LOW (ref >90)
GFR calc non Af Amer: 64 mL/min — ABNORMAL LOW (ref >90)
Glucose, Bld: 274 mg/dL — ABNORMAL HIGH (ref 70–99)
Sodium: 133 mEq/L — ABNORMAL LOW (ref 135–145)
Total Protein: 7.2 g/dL (ref 6.0–8.3)

## 2012-07-04 MED ORDER — CIPROFLOXACIN HCL 500 MG PO TABS: 500.0000 mg | ORAL_TABLET | Freq: Two times a day (BID) | ORAL | Status: DC

## 2012-07-04 MED ORDER — SODIUM CHLORIDE 0.9 % IV BOLUS (SEPSIS)
500.0000 mL | Freq: Once | INTRAVENOUS | Status: AC
  Administered 2012-07-04: 500 mL via INTRAVENOUS

## 2012-07-04 MED ORDER — ONDANSETRON HCL 4 MG/2ML IJ SOLN
4.0000 mg | Freq: Once | INTRAMUSCULAR | Status: AC
  Administered 2012-07-04: 4 mg via INTRAVENOUS
  Filled 2012-07-04: qty 2

## 2012-07-04 MED ORDER — METRONIDAZOLE 500 MG PO TABS: 500.0000 mg | ORAL_TABLET | Freq: Three times a day (TID) | ORAL | Status: DC

## 2012-07-04 NOTE — ED Notes (Signed)
BIB [facility].Patient had a Chemical Stress Test 4 days ago at [facility]. States that he has had N/V/D recurrent for the past 4 days since procedure. No blood noted. CBG 276. 2L home o2

## 2012-07-04 NOTE — ED Notes (Signed)
Patient transported to X-ray 

## 2012-07-04 NOTE — ED Provider Notes (Signed)
History     CSN: [id number]  Arrival date & time 07/04/12  1859   First MD Initiated Contact with Patient 07/04/12 1918      Chief Complaint  Patient presents with  . GI Problem    (Consider location/radiation/quality/duration/timing/severity/associated sxs/prior treatment) Patient is a 78 y.o. male presenting with GI illness. The history is provided by the patient (pt complains of diarhea and some vomiting for a week). No language interpreter was used.  GI Problem  This is a new problem. The current episode started more than 2 days ago. The problem occurs 2 to 4 times per day. The problem has not changed since onset.The stool consistency is described as mucous. There has been no fever. Pertinent negatives include no abdominal pain, no chills, no headaches and no cough. He has tried nothing for the symptoms. The treatment provided moderate relief. Risk factors: none. His past medical history does not include inflammatory bowel disease.    Past Medical History  Diagnosis Date  . Hypertension   . Hypercholesterolemia   . Diabetes   . Benign prostatic hypertrophy   . Emphysema   . Lung nodule   . COPD (chronic obstructive pulmonary disease)   . CKD (chronic kidney disease)   . Colitis     Past Surgical History  Procedure Date  . Hemorrhoid surgery     Family History  Problem Relation Age of Onset  . Stroke Mother   . Heart disease Father     died of heart attack  . Lung cancer Brother   . Hypertension Brother   . Hypothyroidism Brother   . Hyperlipidemia Sister   . Hyperlipidemia Sister   . Breast cancer Sister     History  Substance Use Topics  . Smoking status: Former Smoker -- 0.5 packs/day for 50 years    Types: Cigarettes    Start date: 06/09/1958    Quit date: 06/10/2007  . Smokeless tobacco: Never Used  . Alcohol Use: No      Review of Systems  Constitutional: Negative for chills and fatigue.  HENT: Negative for congestion, sinus pressure and ear  discharge.   Eyes: Negative for discharge.  Respiratory: Negative for cough.   Cardiovascular: Negative for chest pain.  Gastrointestinal: Positive for diarrhea. Negative for abdominal pain.  Genitourinary: Negative for frequency and hematuria.  Musculoskeletal: Negative for back pain.  Skin: Negative for rash.  Neurological: Negative for seizures and headaches.  Hematological: Negative.   Psychiatric/Behavioral: Negative for hallucinations.    Allergies  Review of patient's allergies indicates no known allergies.  Home Medications   Current Outpatient Rx  Name  Route  Sig  Dispense  Refill  . ASPIRIN EC 81 MG PO TBEC   Oral   Take 81 mg by mouth every other day.         . BUDESONIDE-FORMOTEROL FUMARATE 160-4.5 MCG/ACT IN AERO   Inhalation   Inhale 2 puffs into the lungs 2 (two) times daily.         Marland Kitchen CARVEDILOL 25 MG PO TABS   Oral   Take 25 mg by mouth 2 (two) times daily with a meal.          . FINASTERIDE 5 MG PO TABS   Oral   Take 5 mg by mouth daily.          Marland Kitchen LISINOPRIL 20 MG PO TABS   Oral   Take 20 mg by mouth daily.          Marland Kitchen  METFORMIN HCL ER 500 MG PO TB24   Oral   Take 1,000 mg by mouth 2 (two) times daily.         Marland Kitchen TAMSULOSIN HCL 0.4 MG PO CAPS   Oral   Take 0.4 mg by mouth daily.          Marland Kitchen CIPROFLOXACIN HCL 500 MG PO TABS   Oral   Take 1 tablet (500 mg total) by mouth 2 (two) times daily. One po bid x 7 days   20 tablet   0   . METRONIDAZOLE 500 MG PO TABS   Oral   Take 1 tablet (500 mg total) by mouth 3 (three) times daily.   30 tablet   0     BP 125/58  Pulse 95  Temp 98.5 F (36.9 C) (Oral)  Resp 18  SpO2 93%  Physical Exam  Constitutional: He is oriented to person, place, and time. He appears well-developed.  HENT:  Head: Normocephalic and atraumatic.  Eyes: Conjunctivae normal and EOM are normal. No scleral icterus.  Neck: Neck supple. No thyromegaly present.  Cardiovascular: Normal rate and regular  rhythm.  Exam reveals no gallop and no friction rub.   No murmur heard. Pulmonary/Chest: No stridor. He has no wheezes. He has no rales. He exhibits no tenderness.  Abdominal: He exhibits no distension. There is no tenderness. There is no rebound.  Musculoskeletal: Normal range of motion. He exhibits no edema.  Lymphadenopathy:    He has no cervical adenopathy.  Neurological: He is oriented to person, place, and time. Coordination normal.  Skin: No rash noted. No erythema.  Psychiatric: He has a normal mood and affect. His behavior is normal.    ED Course  Procedures (including critical care time)  Labs Reviewed  CBC WITH DIFFERENTIAL - Abnormal; Notable for the following:    WBC 15.0 (*)     Hemoglobin 12.1 (*)     HCT 35.2 (*)     Neutrophils Relative 85 (*)     Neutro Abs 12.7 (*)     Lymphocytes Relative 6 (*)     Monocytes Absolute 1.3 (*)     All other components within normal limits  COMPREHENSIVE METABOLIC PANEL - Abnormal; Notable for the following:    Sodium 133 (*)     Glucose, Bld 274 (*)     Albumin 3.3 (*)     GFR calc non Af Amer 64 (*)     GFR calc Af Amer 74 (*)     All other components within normal limits   Dg Abd Acute W/chest  07/04/2012  *RADIOLOGY REPORT*  Clinical Data: 78 year old male with vomiting and abdominal pain.  ACUTE ABDOMEN SERIES (ABDOMEN 2 VIEW & CHEST 1 VIEW)  Comparison: 06/22/2012 head CT.  06/07/2012 chest radiograph and CT  Findings: A left upper lobe mass is again identified. Mid and lower lung interstitial/possible airspace opacities are unchanged. There is no evidence of pleural effusion or pneumothorax.  Nondistended gas-filled small bowel loops are present.  No dilated bowel loops are noted. There is no evidence of pneumoperitoneum A calcified gallstone is again noted. No acute bony abnormalities are identified.  IMPRESSION: Nonspecific nonobstructive bowel gas pattern - no evidence of pneumoperitoneum.  Unchanged mid and lower lung  pulmonary opacities which could be related to chronic disease or multifocal infection.  Unchanged left upper lobe mass/neoplasm.  Cholelithiasis.   Original Report Authenticated By: Harmon Pier, M.D.      1. Colitis  MDM          Benny Lennert, MD 07/04/12 2255

## 2012-07-04 NOTE — Progress Notes (Signed)
[address]            [place] [address]          [phone]        SONYA PUCCI [facility] Medical Record #[id number] Date of Birth: 26-Jul-1934  Referring: Weston Settle, MD Primary Care: [name], MD  Chief Complaint:    Chief Complaint  Patient presents with  . Lung Lesion    f/u post cardiology consult for lung surgery    History of Present Illness:    Patient is a 78 year old male referred by Dr. 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 On Christmas Day the patient while picking up sticks in his front yard had some onset of shortness of breath and fatigue and was taken to the [facility] emergency room. He was hospitalized for a week diagnosed with pneumonia, in addition a new left upper lobe lung mass was appreciated further workup was performed including a needle biopsy confirming squamous cell carcinoma. Ultimately the patient was discharged home on home oxygen and with respiratory treatments. He notes he is markedly improved at this point no longer using oxygen. He was  referred for consideration of surgical resection. Since last  seen he has cardiac stress test, which was positive and he is to have cardiac cath 07/02/2012    Current Activity/ Functional Status: Patient is independent with mobility/ambulation, transfers, ADL's, IADL's. Zubron 0  Past Medical History  Diagnosis Date  . Hypertension   . Hypercholesterolemia   . Diabetes   . Benign prostatic hypertrophy   . Emphysema   . Lung nodule   . COPD (chronic obstructive pulmonary disease)   . CKD (chronic kidney disease)   . Colitis     Past Surgical History  Procedure Date  . Hemorrhoid surgery     Family History  Problem Relation Age of Onset  . Stroke Mother   . Heart disease Father     died of heart attack  . Lung cancer Brother   . Hypertension  Brother   . Hypothyroidism Brother   . Hyperlipidemia Sister   . Hyperlipidemia Sister   . Breast cancer Sister     History   Social History  . Marital Status: Married    Spouse Name: N/A    Number of Children: 1  . Years of Education: N/A   Occupational History  . retired [employer] for over 20 years  . retired     Personnel officer for 10 years   Social History Main Topics  . Smoking status: Former Smoker -- 0.5 packs/day for 50 years    Types: Cigarettes    Start date: 06/09/1958    Quit date: 06/10/2007  . Smokeless tobacco: Never Used  . Alcohol Use: No  . Drug Use: No  . Sexually Active: Not on file      History  Smoking status  . Former Smoker -- 0.5 packs/day for 50 years  . Types: Cigarettes  . Start date: 06/09/1958  . Quit date: 06/10/2007  Smokeless tobacco  . Never Used    History  Alcohol Use No     No Known Allergies  Current Outpatient Prescriptions  Medication Sig Dispense  Refill  . carvedilol (COREG) 25 MG tablet Take 25 mg by mouth 2 (two) times daily with a meal.       . finasteride (PROSCAR) 5 MG tablet Take 5 mg by mouth daily.       Marland Kitchen lisinopril (PRINIVIL,ZESTRIL) 20 MG tablet Take 20 mg by mouth daily.       . Tamsulosin HCl (FLOMAX) 0.4 MG CAPS Take 0.4 mg by mouth daily.       Marland Kitchen aspirin EC 81 MG tablet Take 81 mg by mouth every other day.      . budesonide-formoterol (SYMBICORT) 160-4.5 MCG/ACT inhaler Inhale 2 puffs into the lungs 2 (two) times daily.      Marland Kitchen ipratropium-albuterol (DUONEB) 0.5-2.5 (3) MG/3ML SOLN Take 3 mLs by nebulization every 6 (six) hours as needed. For shortness of breath      . metFORMIN (GLUCOPHAGE-XR) 500 MG 24 hr tablet Take 1,000 mg by mouth 2 (two) times daily.           Review of Systems:     Cardiac Review of Systems: Y or N  Chest Pain [ vague not sure    ]  Resting SOB [ n  ] Exertional SOB  Cove.Etienne  ]  Orthopnea [ n ]   Pedal Edema [ y  ]    Palpitations Cove.Etienne  ] Syncope  [  n ]   Presyncope [ n  ]  General Review of Systems: [Y] = yes [  ]=no Constitional: recent weight change Milo.Brash  ]; anorexia [ n ]; fatigue Cove.Etienne  ]; nausea [n  ]; night sweats [n  ]; fever [ n ]; or chills [ n ];                                                                                                                                          Dental: poor dentition[ dentures ];   Eye : blurred vision [  ]; diplopia [   ]; vision changes [  ];  Amaurosis fugax[  ]; Resp: cough [ y ];  wheezing[y  ];  hemoptysis[n  ]; shortness of breath[y  ]; paroxysmal nocturnal dyspnea[  ]; dyspnea on exertion[ y ]; or orthopnea[  ];  GI:  gallstones[  ], vomiting[  ];  dysphagia[  ]; melena[  ];  hematochezia [  ]; heartburn[  ];   Hx of  Colonoscopy[y  ]; GU: kidney stones [  ]; hematuria[  ];   dysuria [n  ];  nocturia[  ];  history of     obstruction [  ];             Skin: rash, swelling[  ];, hair loss[  ];  peripheral edema[  ];  or itching[  ]; Musculosketetal: myalgias[  ];  joint swelling[  ];  joint erythema[  ];  joint pain[  ];  back pain[  ];  Heme/Lymph: bruising[  ];  bleeding[  ];  anemia[  ];  Neuro: TIA[  ];  headaches[  ];  stroke[  ];  vertigo[  ];  seizures[  ];   paresthesias[  ];  difficulty walking[  ];  Psych:depression[  ]; anxiety[  ];  Endocrine: diabetes[ y ];  thyroid dysfunction[  ];  Immunizations: Flu [ y ]; Pneumococcal[y  ];  Other:  Physical Exam: BP 149/82  Pulse 60  Resp 18  Ht 5\' 10"  (1.778 m)  Wt 180 lb (81.647 kg)  BMI 25.83 kg/m2  SpO2 92%  General appearance: alert, cooperative, appears stated age and no distress Neurologic: intact Heart: regular rate and rhythm, S1, S2 normal, no murmur, click, rub or gallop and normal apical impulse Lungs: diminished breath sounds bibasilar Abdomen: soft, non-tender; bowel sounds normal; no masses,  no organomegaly Extremities: edema 2+ pedal edema in both ankles, patient notes its worst and in the afternoons Do not  appreciate any cervical or supraclavicular adenopathy he has no axillary adenopathy   Diagnostic Studies & Laboratory data:     Recent Radiology Findings:  Ct of chest , pet  , ct of head done at [facility] [place] system PET scan shows a 3.7 cm mass with an SUV of 9.7 in the left upper lobe abutting the pleura there is no evidence of hypermetabolic mediastinal nodes or other metastasis both on the PET scan and CT scan there is prominent coronary calcifications also gallstones are present.  The scar area in the right upper lobe was not hypermetabolic .CT scan of the chest done 06/02/2012 was negative for pulmonary emboli a 4.1 cm mass in the anterior aspect of the left upper lobe with adjacent pneumonitis is noted there is a less well-defined spiculated mass in the right apex is enlarged bowel are hilar and mediastinal nodes as poorly defined asymmetric groundglass opacities throughout both lower lobes right greater than left there significant coronary artery calcifications.   A CT-guided needle biopsy of left upper lobe mass was done 06/07/2012 at [facility] number [id number] is interpreted by Dr. Ferne Reus has squamous cell carcinoma Recent Lab Findings: Lab Results  Component Value Date   WBC 5.0 07/01/2012   Cardiac Stress Test: Impression  Exercise Capacity: Lexiscan with no exercise.  BP Response: Normal blood pressure response.  Clinical Symptoms: No significant symptoms noted.  ECG Impression: No significant ST segment change suggestive of ischemia.  Comparison with Prior Nuclear Study: No previous nuclear study performed  Overall Impression: Intermediate stress nuclear study. Large lateral and inferolateral wall infarct with mild peri infarct ischemia  LV Ejection Fraction: 45%. LV Wall Motion: Inferior and lateral wall hypokinesis  Charlton Haws   PFT's  FEV1 2.47 79 %, DLCO 9.69 29%    Assessment / Plan:      1 new diagnosis of stage clinical stage IB squamous cell carcinoma  of the left upper lobe 2 Advanced emphysema with severe diffusion defect, has not been smoking for 4 years 3 History of diabetes 4 Recent admission for sudden onset of shortness of breath diagnosed as pneumonia and treated at [facility]  Patient seen in the office 07/01/2012, need for cardiac cath before surgical resection explained to the patient wife and daughter   Delight Ovens MD  Beeper [phone] Office [phone] 07/04/2012 6:31 PM  Post visit note Cardiac cath done 07/02/2012. See report : Hemodynamic Findings:  Central aortic pressure: 134/49  Left ventricular pressure: 134/15/23  Angiographic Findings:  Left main: Distal 20% stenosis.  Left Anterior Descending Artery: Large caliber vessel that courses to the apex. The proximal vessel has diffuse plaque disease. The mid vessel has a 60% stenosis just before the diagonal branch. The first diagonal branch is small in caliber and has diffuse disease. The second diagonal is small in caliber and has 99% stenosis.  Circumflex Artery: Large caliber vessel with 99% proximal stenosis. The distal AV groove Circumflex has diffuse 40% stenosis.  Right Coronary Artery: Large dominant vessel with 80% diffuse stenosis proximal vessel. The mid vessel has a 100% stenosis. The distal vessel fills from left to right collaterals.  Left Ventricular Angiogram: LVEF=45%. Inferior wall hypokinesis.  Impression:  1. 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  2. Segmental LV systolic dysfunction  Recommendations: Complex situation in patient with lung cancer and new diagnosis of triple vessel CAD. His RCA is chronically occluded. The proximal Circumflex has a severe stenosis which could be treated percutaneously however this will need to be discussed with Dr. [name] and Dr. Eden Emms in regards to planning the timing with lobectomy. Will discuss possibility of CABG versus stenting of  the Circumflex with a bare metal stent which would require one month of dual anti-platelet therapy. He will go home today and further planning will be done as an outpatient.  Complications: None. The patient tolerated the procedure well.   Discussed with cardiology plan to precede with bare metal stent dilation of the high grade cx lesion . The consider lung resection in 30 days when can be off plavix.

## 2012-07-05 ENCOUNTER — Telehealth: Payer: Self-pay | Admitting: Cardiovascular Disease

## 2012-07-05 NOTE — Telephone Encounter (Signed)
SPOKE WITH PT'S DAUGHTER   PT CONTINUES TO HAVE DIARRHEA TODAY  IS VERY WEAK WAS  SEEN IN ER AND WAS DX WITH COLITIS  YESTERDAY . CATH CX FOR TOMORROW WITH DR [facility]  AND RESCHEDULED  FOR 07-12-12  PT NEEDS TO BE THERE AT 8:30 , DAUGHTER AWARE OF CHANGE .Zack Seal

## 2012-07-05 NOTE — Telephone Encounter (Signed)
New problem:   911 was called to home last night - transport to [place] er .  C/O uncontrolled diarrhea. No weak, not eating .   Please advise on procedure for tomorrow.

## 2012-07-12 ENCOUNTER — Encounter (HOSPITAL_COMMUNITY)
Admission: RE | Disposition: A | Discharge status: Home / Self Care | Payer: Self-pay | Source: Ambulatory Visit | Attending: Cardiovascular Disease

## 2012-07-12 ENCOUNTER — Ambulatory Visit (HOSPITAL_COMMUNITY)
Admission: RE | Admit: 2012-07-12 | Discharge: 2012-07-13 | Disposition: A | Discharge status: Home / Self Care | Payer: Medicare Other | Source: Ambulatory Visit | Attending: Cardiovascular Disease | Admitting: Cardiovascular Disease

## 2012-07-12 DIAGNOSIS — F17200 Nicotine dependence, unspecified, uncomplicated: Secondary | ICD-10-CM | POA: Insufficient documentation

## 2012-07-12 DIAGNOSIS — D649 Anemia, unspecified: Secondary | ICD-10-CM | POA: Insufficient documentation

## 2012-07-12 DIAGNOSIS — I119 Hypertensive heart disease without heart failure: Secondary | ICD-10-CM | POA: Diagnosis present

## 2012-07-12 DIAGNOSIS — R197 Diarrhea, unspecified: Secondary | ICD-10-CM | POA: Insufficient documentation

## 2012-07-12 DIAGNOSIS — J439 Emphysema, unspecified: Secondary | ICD-10-CM

## 2012-07-12 DIAGNOSIS — E7800 Pure hypercholesterolemia, unspecified: Secondary | ICD-10-CM

## 2012-07-12 DIAGNOSIS — E1159 Type 2 diabetes mellitus with other circulatory complications: Secondary | ICD-10-CM | POA: Diagnosis present

## 2012-07-12 DIAGNOSIS — I2510 Atherosclerotic heart disease of native coronary artery without angina pectoris: Secondary | ICD-10-CM

## 2012-07-12 DIAGNOSIS — I10 Essential (primary) hypertension: Secondary | ICD-10-CM

## 2012-07-12 DIAGNOSIS — N289 Disorder of kidney and ureter, unspecified: Secondary | ICD-10-CM | POA: Insufficient documentation

## 2012-07-12 DIAGNOSIS — Z955 Presence of coronary angioplasty implant and graft: Secondary | ICD-10-CM

## 2012-07-12 DIAGNOSIS — C3490 Malignant neoplasm of unspecified part of unspecified bronchus or lung: Secondary | ICD-10-CM

## 2012-07-12 DIAGNOSIS — I498 Other specified cardiac arrhythmias: Secondary | ICD-10-CM | POA: Insufficient documentation

## 2012-07-12 DIAGNOSIS — E119 Type 2 diabetes mellitus without complications: Secondary | ICD-10-CM

## 2012-07-12 HISTORY — DX: Anemia, unspecified: D64.9

## 2012-07-12 HISTORY — DX: Type 2 diabetes mellitus without complications: E11.9

## 2012-07-12 HISTORY — DX: Malignant neoplasm of unspecified part of unspecified bronchus or lung: C34.90

## 2012-07-12 HISTORY — PX: PERCUTANEOUS CORONARY STENT INTERVENTION (PCI-S): SHX5485

## 2012-07-12 HISTORY — DX: Atherosclerotic heart disease of native coronary artery without angina pectoris: I25.10

## 2012-07-12 LAB — BASIC METABOLIC PANEL
CO2: 17 mEq/L — ABNORMAL LOW (ref 19–32)
Calcium: 7.9 mg/dL — ABNORMAL LOW (ref 8.4–10.5)
Creatinine, Ser: 1.78 mg/dL — ABNORMAL HIGH (ref 0.50–1.35)
GFR calc non Af Amer: 36 mL/min — ABNORMAL LOW (ref >90)

## 2012-07-12 LAB — CBC
MCV: 81.3 fL (ref 78.0–100.0)
Platelets: 185 10*3/uL (ref 150–400)
RDW: 15.7 % — ABNORMAL HIGH (ref 11.5–15.5)
WBC: 10.1 10*3/uL (ref 4.0–10.5)

## 2012-07-12 LAB — POCT ACTIVATED CLOTTING TIME: Activated Clotting Time: 241 seconds

## 2012-07-12 LAB — GLUCOSE, CAPILLARY
Glucose-Capillary: 328 mg/dL — ABNORMAL HIGH (ref 70–99)
Glucose-Capillary: 254 mg/dL — ABNORMAL HIGH (ref 70–99)
Glucose-Capillary: 228 mg/dL — ABNORMAL HIGH (ref 70–99)

## 2012-07-12 SURGERY — PERCUTANEOUS CORONARY STENT INTERVENTION (PCI-S)
Anesthesia: LOCAL

## 2012-07-12 MED ORDER — SODIUM CHLORIDE 0.9 % IV SOLN
INTRAVENOUS | Status: AC
  Administered 2012-07-12: 12:00:00 via INTRAVENOUS

## 2012-07-12 MED ORDER — ASPIRIN 81 MG PO CHEW
324.0000 mg | CHEWABLE_TABLET | ORAL | Status: AC
  Administered 2012-07-12: 324 mg via ORAL
  Filled 2012-07-12: qty 4

## 2012-07-12 MED ORDER — TAMSULOSIN HCL 0.4 MG PO CAPS
0.4000 mg | ORAL_CAPSULE | Freq: Every day | ORAL | Status: DC
  Administered 2012-07-12 – 2012-07-13 (×2): 0.4 mg via ORAL
  Filled 2012-07-12 (×2): qty 1

## 2012-07-12 MED ORDER — ACETAMINOPHEN 325 MG PO TABS: 650.0000 mg | ORAL_TABLET | ORAL | Status: DC | PRN

## 2012-07-12 MED ORDER — SODIUM CHLORIDE 0.9 % IJ SOLN: 3.0000 mL | Freq: Two times a day (BID) | INTRAMUSCULAR | Status: DC

## 2012-07-12 MED ORDER — ASPIRIN EC 81 MG PO TBEC
81.0000 mg | DELAYED_RELEASE_TABLET | ORAL | Status: DC
  Filled 2012-07-12: qty 1

## 2012-07-12 MED ORDER — ONDANSETRON HCL 4 MG/2ML IJ SOLN: 4.0000 mg | Freq: Four times a day (QID) | INTRAMUSCULAR | Status: DC | PRN

## 2012-07-12 MED ORDER — CLOPIDOGREL BISULFATE 75 MG PO TABS: 75.0000 mg | ORAL_TABLET | Freq: Every day | ORAL | Status: DC

## 2012-07-12 MED ORDER — METRONIDAZOLE 500 MG PO TABS
500.0000 mg | ORAL_TABLET | Freq: Three times a day (TID) | ORAL | Status: DC
  Administered 2012-07-12 – 2012-07-13 (×2): 500 mg via ORAL
  Filled 2012-07-12 (×7): qty 1

## 2012-07-12 MED ORDER — FENTANYL CITRATE 0.05 MG/ML IJ SOLN
INTRAMUSCULAR | Status: AC
  Filled 2012-07-12: qty 2

## 2012-07-12 MED ORDER — SODIUM CHLORIDE 0.9 % IV SOLN: 250.0000 mL | INTRAVENOUS | Status: DC | PRN

## 2012-07-12 MED ORDER — CLOPIDOGREL BISULFATE 300 MG PO TABS
ORAL_TABLET | ORAL | Status: AC
  Filled 2012-07-12: qty 2

## 2012-07-12 MED ORDER — SODIUM CHLORIDE 0.9 % IV SOLN
INTRAVENOUS | Status: DC
  Administered 2012-07-12: 09:00:00 via INTRAVENOUS

## 2012-07-12 MED ORDER — BIVALIRUDIN 250 MG IV SOLR
INTRAVENOUS | Status: AC
  Filled 2012-07-12: qty 250

## 2012-07-12 MED ORDER — INSULIN ASPART 100 UNIT/ML ~~LOC~~ SOLN
0.0000 [IU] | Freq: Three times a day (TID) | SUBCUTANEOUS | Status: DC
  Administered 2012-07-12: 19:00:00 3 [IU] via SUBCUTANEOUS
  Administered 2012-07-12: 5 [IU] via SUBCUTANEOUS
  Administered 2012-07-13: 3 [IU] via SUBCUTANEOUS

## 2012-07-12 MED ORDER — INSULIN ASPART 100 UNIT/ML ~~LOC~~ SOLN
0.0000 [IU] | Freq: Every day | SUBCUTANEOUS | Status: DC
  Administered 2012-07-12: 22:00:00 2 [IU] via SUBCUTANEOUS

## 2012-07-12 MED ORDER — SODIUM CHLORIDE 0.9 % IJ SOLN: 3.0000 mL | INTRAMUSCULAR | Status: DC | PRN

## 2012-07-12 MED ORDER — BUDESONIDE-FORMOTEROL FUMARATE 160-4.5 MCG/ACT IN AERO
2.0000 | INHALATION_SPRAY | Freq: Two times a day (BID) | RESPIRATORY_TRACT | Status: DC
  Administered 2012-07-12 – 2012-07-13 (×2): 2 via RESPIRATORY_TRACT
  Filled 2012-07-12: qty 6

## 2012-07-12 MED ORDER — HEPARIN (PORCINE) IN NACL 2-0.9 UNIT/ML-% IJ SOLN
INTRAMUSCULAR | Status: AC
  Filled 2012-07-12: qty 1000

## 2012-07-12 MED ORDER — LIDOCAINE HCL (PF) 1 % IJ SOLN
INTRAMUSCULAR | Status: AC
  Filled 2012-07-12: qty 30

## 2012-07-12 MED ORDER — CARVEDILOL 25 MG PO TABS
25.0000 mg | ORAL_TABLET | Freq: Two times a day (BID) | ORAL | Status: DC
  Administered 2012-07-12 – 2012-07-13 (×2): 25 mg via ORAL
  Filled 2012-07-12 (×5): qty 1

## 2012-07-12 MED ORDER — MIDAZOLAM HCL 2 MG/2ML IJ SOLN
INTRAMUSCULAR | Status: AC
  Filled 2012-07-12: qty 2

## 2012-07-12 MED ORDER — FINASTERIDE 5 MG PO TABS
5.0000 mg | ORAL_TABLET | Freq: Every day | ORAL | Status: DC
  Administered 2012-07-12: 5 mg via ORAL
  Filled 2012-07-12 (×2): qty 1

## 2012-07-12 NOTE — Interval H&P Note (Signed)
History and Physical Interval Note:  07/12/2012 9:57 AM  Brandon Clay  has presented today for [facility] for severe stenosis Circumflex. He has lung cancer and needs lobectomy. Plan for PCI Circumflex with bare metal stent, one month ASA/Plavix and then lobectomy. Diagnostic cath two weeks ago occluded RCA and severe stenosis Circumflex.  The various methods of treatment have been discussed with the patient and family. After consideration of risks, benefits and other options for treatment, the patient has consented to  Procedure(s) (LRB) with comments: PERCUTANEOUS CORONARY STENT INTERVENTION (PCI-S) (N/A) as a surgical intervention .  The patient's history has been reviewed, patient examined, no change in status, stable for surgery.  I have reviewed the patient's chart and labs.  Questions were answered to the patient's satisfaction.     Brandon Clay

## 2012-07-12 NOTE — CV Procedure (Signed)
Cardiac Catheterization Operative Report  Brandon Clay [id number] 2/3/201411:10 AM [name], MD  Procedure Performed:  1. PTCA/bare metal stent x 1 proximal Circumflex artery    Operator: Verne Carrow, MD  Indication: 78 yo male with history of  tobacco abuse, DM, HTN, recent diagnosis of lung cancer and recent diagnosis of CAD by cath 07/02/12 who is here today for planned PCI of the Circumflex artery. He has a new diagnosis of lung cancer and in pre-op workup for lobectomy, found to have an abnormal stress myoview suggesting inferior and lateral scar with possible ischemia. There is planning in place for lobectomy per Dr. [name]. Diagnostic cath with totally occluded RCA, severe stenosis proximal Circumflex, occluded OM1, moderately severe disease mid LAD. There was a discussion regarding possible CABG but he is felt to be too high risk for combined lobectomy and CABG in the same setting. He is also felt to be too high risk for lobectomy without revascularization of the severe stenosis in the proximal Circumflex. His RCA fills from left to right collaterals. The plan will be for treatment of the Circumflex stenosis with a bare metal stent and one month of dual anti-platelet therapy with ASA and Plavix then lobectomy per Dr. [name].                                       Procedure Details: The risks, benefits, complications, treatment options, and expected outcomes were discussed with the patient. The patient and/or family concurred with the proposed plan, giving informed consent. The patient was brought to the cath lab after IV hydration was begun and oral premedication was given. The patient was further sedated with Versed and Fentanyl. The right groin was prepped and draped in the usual manner. Using the modified Seldinger access technique, a 6 French sheath was placed in the right femoral artery. He was given Plavix 600 mg po x 1. An Angiomax bolus was given and a  drip was started. I then engaged the left main with a XB 3.0 guiding catheter. When the ACT was greater than 200, I passed a Cougar IC wire down the Circumflex. The stenosis was severe. I then began dilating the stenosis with a 2.5 x 12 mm balloon. The stenosis did not dilate. I then tried to dilate with a 2.5 x 15 mm Tombstone balloon with residual waste. The stenosis was calcified and fibrotic. I then was able to nose a 2.5 x 10 mm cutting balloon into the stenosis and inflated this twice. This altered the stenosis. A 2.75 x 8 mm Jal balloon was then inflated 3 times with good dilation of the stenosis. There was no waste seen on this balloon inflation. I then deployed a 3.0 x 16 mm VeriFlex bare metal stent in the proximal Circumflex artery extending back to the ostium. This stent was post-dilated with a 3.25 x 12 mm Essex balloon. There was an excellent angiographic result. The stenosis was taken from 99% down to 0%. There was excellent flow into the distal vessel at the conclusion of the case. Right femoral angiogram with arteriotomy in a high location in the right femoral artery, not favorable for a closure device.   There were no immediate complications. The patient was taken to the recovery area in stable condition.   Hemodynamic Findings: Central aortic pressure: 108/49  Impression: 1. Triple vessel CAD with chronic occlusion of RCA (fills from left  collaterals), severe stenosis proximal Circumflex and moderately severe disease in the mid LAD.  2. Not a good candidate for combined CABG/lobectomy procedure. 3. Successful PTCA/bare metal stent x 1 proximal Circumflex  Recommendations: We will admit for aggressive hydration overnight. He is felt to be pre-renal from recent diarrhea which has mostly resolved. He will need one month of dual anti-platelet therapy with ASA and Plavix then lobectomy per Dr. [name]. Will send stool for C. Difficile toxin however he has been on Flagyl and diarrhea is mostly  resolved. No documented c. Diff colitis.         Complications:  None; patient tolerated the procedure well.

## 2012-07-12 NOTE — H&P (View-Only) (Signed)
Patient ID: Brandon Clay, male   DOB: 07/23/1934, 78 y.o.   MRN: [id number] 78 yo referred by Dr Gerhardt for clearance  Has multiple CRF;s and lung cancer needing lobectomy. Sees Dr Lewis in [place] Quit smoking in 2009 but has emphysema  Recent hospitalization in [place] with pneumonia and cancer found.  No documented CAD  HTN controlled with meds.  IDDM for over 10 years.  Mild exertional dyspnea No chest pain.  Activity level is reasonable  No bleeding diathesis or previous anesthetic issues  ROS: Denies fever, malais, weight loss, blurry vision, decreased visual acuity, cough, sputum, SOB, hemoptysis, pleuritic pain, palpitaitons, heartburn, abdominal pain, melena, lower extremity edema, claudication, or rash.  All other systems reviewed and negative   General: Affect appropriate Healthy:  appears stated age HEENT: normal Neck supple with no adenopathy JVP normal no bruits no thyromegaly Lungs clear with no wheezing and good diaphragmatic motion Heart:  S1/S2 no murmur,rub, gallop or click PMI normal Abdomen: benighn, BS positve, no tenderness, no AAA no bruit.  No HSM or HJR Distal pulses intact with no bruits No edema Neuro non-focal Skin warm and dry No muscular weakness  Medications Current Outpatient Prescriptions  Medication Sig Dispense Refill  . aspirin 81 MG tablet Take 81 mg by mouth every other day.      . carvedilol (COREG) 25 MG tablet Take 25 mg by mouth 2 (two) times daily with a meal.       . finasteride (PROSCAR) 5 MG tablet Take 5 mg by mouth daily.       . lisinopril (PRINIVIL,ZESTRIL) 20 MG tablet Take 20 mg by mouth daily.       . metFORMIN (GLUCOPHAGE-XR) 500 MG 24 hr tablet Take 1,000 mg by mouth 2 (two) times daily.       . Tamsulosin HCl (FLOMAX) 0.4 MG CAPS 0.4 mg daily.         Allergies Review of patient's allergies indicates no known allergies.  Family History: Family History  Problem Relation Age of Onset  . Stroke Mother   . Heart disease  Father     died of heart attack  . Lung cancer Brother   . Hypertension Brother   . Hypothyroidism Brother   . Hyperlipidemia Sister   . Hyperlipidemia Sister   . Breast cancer Sister     Social History: History   Social History  . Marital Status: Married    Spouse Name: N/A    Number of Children: 1  . Years of Education: N/A   Occupational History  . 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 retired     electrician for 10 years   Social History Main Topics  . Smoking status: Former Smoker -- 0.5 packs/day for 50 years    Types: Cigarettes    Start date: 06/09/1958    Quit date: 06/10/2007  . Smokeless tobacco: Never Used  . Alcohol Use: No  . Drug Use: No  . Sexually Active: Not on file   Other Topics Concern  . Not on file   Social History Narrative  . No narrative on file    Electrocardiogram:  SR rate 82  ? Old IMI lateral T wave changes  Assessment and Plan   

## 2012-07-12 NOTE — Progress Notes (Signed)
Site area: right groin  Site Prior to Removal:  Level 0  Pressure Applied For 20 MINUTES    Minutes Beginning at 1500  Manual:   yes  Patient Status During Pull:  Stable   Post Pull Groin Site:  Level 0  Post Pull Instructions Given:  yes  Post Pull Pulses Present:  yes  Dressing Applied:  yes  Comments:

## 2012-07-12 NOTE — Progress Notes (Signed)
Utilization Review Completed Sebrena Engh J. Peni Rupard, RN, BSN, NCM [phone]  

## 2012-07-13 ENCOUNTER — Encounter (HOSPITAL_COMMUNITY): Payer: Self-pay | Admitting: *Deleted

## 2012-07-13 DIAGNOSIS — J438 Other emphysema: Secondary | ICD-10-CM

## 2012-07-13 DIAGNOSIS — C3490 Malignant neoplasm of unspecified part of unspecified bronchus or lung: Secondary | ICD-10-CM

## 2012-07-13 DIAGNOSIS — I2510 Atherosclerotic heart disease of native coronary artery without angina pectoris: Secondary | ICD-10-CM | POA: Diagnosis present

## 2012-07-13 DIAGNOSIS — E119 Type 2 diabetes mellitus without complications: Secondary | ICD-10-CM

## 2012-07-13 LAB — CBC
HCT: 29.6 % — ABNORMAL LOW (ref 39.0–52.0)
MCH: 28.2 pg (ref 26.0–34.0)
MCHC: 34.8 g/dL (ref 30.0–36.0)
MCV: 81.1 fL (ref 78.0–100.0)
Platelets: 173 10*3/uL (ref 150–400)
RDW: 16 % — ABNORMAL HIGH (ref 11.5–15.5)
WBC: 8.2 10*3/uL (ref 4.0–10.5)

## 2012-07-13 LAB — PLATELET INHIBITION P2Y12: Platelet Function  P2Y12: 332 [PRU] (ref 194–418)

## 2012-07-13 LAB — BASIC METABOLIC PANEL
BUN: 35 mg/dL — ABNORMAL HIGH (ref 6–23)
Calcium: 7.7 mg/dL — ABNORMAL LOW (ref 8.4–10.5)
Chloride: 106 mEq/L (ref 96–112)
Creatinine, Ser: 1.59 mg/dL — ABNORMAL HIGH (ref 0.50–1.35)
GFR calc Af Amer: 47 mL/min — ABNORMAL LOW (ref >90)
GFR calc non Af Amer: 41 mL/min — ABNORMAL LOW (ref >90)

## 2012-07-13 LAB — CLOSTRIDIUM DIFFICILE BY PCR: Toxigenic C. Difficile by PCR: NEGATIVE

## 2012-07-13 MED ORDER — TICAGRELOR 90 MG PO TABS: 90.0000 mg | ORAL_TABLET | Freq: Two times a day (BID) | ORAL | Status: DC

## 2012-07-13 MED ORDER — METFORMIN HCL ER 500 MG PO TB24: 1000.0000 mg | ORAL_TABLET | Freq: Two times a day (BID) | ORAL | Status: DC

## 2012-07-13 MED ORDER — TICAGRELOR 90 MG PO TABS
180.0000 mg | ORAL_TABLET | Freq: Once | ORAL | Status: AC
  Administered 2012-07-13: 180 mg via ORAL
  Filled 2012-07-13: qty 2

## 2012-07-13 MED FILL — Dextrose Inj 5%: INTRAVENOUS | Qty: 50 | Status: AC

## 2012-07-13 NOTE — Discharge Summary (Signed)
Discharge Summary   Patient ID: Brandon Clay MRN: [id number], DOB/AGE: [birth year] 78 y.o.  Primary MD: [name], MD Primary Cardiologist: Dr. Eden Emms Admit date: 07/12/2012 D/C date:     07/13/2012      Primary Discharge Diagnoses:  1. Coronary Artery Disease  - 3v CAD w/ chronic occlusion of RCA (fills from left collaterals), severe stenosis prox LCx & moderately severe dz in mid LAD. Felt too high risk for combined lobectomy and CABG. S/p PTCA/BMS to prox LCx  - DAPT w/ ASA and Brilinta x1 month (Plavix nonresponder)  2. Lung CA  - Planned lobectomy with Dr. [name] after one month of DAPT as above  3. Hyperlipidemia  - Statin intolerant  - Outpatient f/u with PCP and/or Dr. Eden Emms to discuss statin alternatives and/or lower dose statin w/ CoQ10  4. Hypertension  - Lisinopril on hold due to elevated Crt; Follow BP and BMET as outpatient  5. Diabetes Mellitus, Type 2, Uncontrolled  - Metformin on hold for 48hrs post cath  - Follow up with PCP  6. Normocytic Anemia  - Hgb stable; Pt reported dark stools  - Will need f/u w/ PCP for stool guaiac and follow up CBC  7. Acute Renal Insufficiency  - In the setting of dehydration 2/2 diarrhea  - Crt 1.59 at dc; F/u BMET  8. NSVT  - One episode, asymptomatic; Coreg continued   Secondary Discharge Diagnoses:  . Benign prostatic hypertrophy  . Emphysema  . COPD (chronic obstructive pulmonary disease)  . Colitis    Allergies No Known Allergies  Diagnostic Studies/Procedures:   07/02/12 - Cardiac Cath  Hemodynamic Findings:  Central aortic pressure: 134/49  Left ventricular pressure: 134/15/23  Angiographic Findings:  Left main: Distal 20% stenosis.  Left Anterior Descending Artery: Large caliber vessel that courses to the apex. The proximal vessel has diffuse plaque disease. The mid vessel has a 60% stenosis just before the diagonal branch. The first diagonal branch is small in caliber and has diffuse  disease. The second diagonal is small in caliber and has 99% stenosis.  Circumflex Artery: Large caliber vessel with 99% proximal stenosis. The distal AV groove Circumflex has diffuse 40% stenosis.  Right Coronary Artery: Large dominant vessel with 80% diffuse stenosis proximal vessel. The mid vessel has a 100% stenosis. The distal vessel fills from left to right collaterals.  Left Ventricular Angiogram: LVEF=45%. Inferior wall hypokinesis.  Impression:  1. 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  2. Segmental LV systolic dysfunction  Recommendations: Complex situation in patient with lung cancer and new diagnosis of triple vessel CAD. His RCA is chronically occluded. The proximal Circumflex has a severe stenosis which could be treated percutaneously however this will need to be discussed with Dr. [name] and Dr. Eden Emms in regards to planning the timing with lobectomy. Will discuss possibility of CABG versus stenting of the Circumflex with a bare metal stent which would require one month of dual anti-platelet therapy. He will go home today and further planning will be done as an outpatient.   07/12/12 - Cardiac Cath  Hemodynamic Findings:  Central aortic pressure: 108/49  Impression:  1. Triple vessel CAD with chronic occlusion of RCA (fills from left collaterals), severe stenosis proximal Circumflex and moderately severe disease in the mid LAD.  2. Not a good candidate for combined CABG/lobectomy procedure.  3. Successful PTCA/bare metal stent x 1 proximal Circumflex  Recommendations: We will admit for aggressive hydration  overnight. He is felt to be pre-renal from recent diarrhea which has mostly resolved. He will need one month of dual anti-platelet therapy with ASA and Plavix then lobectomy per Dr. [name]. Will send stool for C. Difficile toxin however he has been on Flagyl and diarrhea is mostly resolved. No documented c.  Diff colitis.    History of Present Illness: 78 y.o. male w/ the above medical problems who presented to [facility] on 07/12/12 for planned PCI.  He was recently diagnosed with lung cancer and in pre-op workup for lobectomy, found to have an abnormal stress myoview suggesting inferior and lateral scar with possible ischemia. Diagnostic cath 07/02/12 with totally occluded RCA, severe stenosis proximal Circumflex, occluded OM1, moderately severe disease mid LAD. There was a discussion regarding possible CABG but he was felt to be too high risk for combined lobectomy and CABG in the same setting. He was also felt to be too high risk for lobectomy without revascularization of the severe stenosis in the proximal Circumflex. His RCA fills from left to right collaterals. The plan was for treatment of the Circumflex stenosis with a bare metal stent and one month of dual anti-platelet therapy then lobectomy per Dr. [name]. PCI was planned for 07/06/12, but due to diarrhea and colitis it was postponed until 07/12/12.  Hospital Course:  He presented to [facility] on 07/12/12 in stable condition. Cardiac cath was performed and he was treated with PTCA/BMS to the prox LCx. He tolerated the procedure well. Recommendations were for DAPT for one month. PRU was high on Plavix, consistent with nonresponder, so he was switched to Brilinta. He was observed overnight for IV hydration given elevated Crt which was improving on day of discharge. He will need follow up BMET in the next week. Lisinopril was held given acute renal insufficiency. Metformin was also held. He reported leg myalgias with statins so it is recommended he follow up with his PCP or Dr. Eden Emms to discuss statin alternatives and/or lower dose statin with CoQ10. Given DAPT and reports of dark stools it is recommended he follow up with his PCP for stool guaiac and follow up CBC. He was noted to have one episode of what appeared to be NSVT during which he  remained asymptomatic and hemodynamically stable. His Coreg was continued. Cath site remained stable. He was able to ambulate without chest pain. He was seen and evaluated by Dr. [place] who felt he was stable for discharge home with plans for follow up as scheduled below.  Discharge Vitals: Blood pressure 137/57, pulse 85, temperature 97.4 F (36.3 C), temperature source Oral, resp. rate 15, height 5\' 10"  (1.778 m), weight 179 lb 7.3 oz (81.4 kg), SpO2 95.00%.  Labs: Component Value Date   WBC 8.2 07/13/2012   HGB 10.3* 07/13/2012   HCT 29.6* 07/13/2012   MCV 81.1 07/13/2012   PLT 173 07/13/2012    Lab 07/13/12 0430  NA 136  K 3.7  CL 106  CO2 19  BUN 35*  CREATININE 1.59*  CALCIUM 7.7*  GLUCOSE 252*     Ref. Range 07/13/2012 04:30  Platelet Function  P2Y12 Latest Range: 194-418 PRU 332     07/13/2012 04:59  C difficile by pcr NEGATIVE    Discharge Medications     Medication List     As of 07/13/2012 11:26 AM    STOP taking these medications         ciprofloxacin 500 MG tablet   Commonly known as: CIPRO  lisinopril 20 MG tablet   Commonly known as: PRINIVIL,ZESTRIL      TAKE these medications         aspirin EC 81 MG tablet   Take 81 mg by mouth every other day.      budesonide-formoterol 160-4.5 MCG/ACT inhaler   Commonly known as: SYMBICORT   Inhale 2 puffs into the lungs 2 (two) times daily.      carvedilol 25 MG tablet   Commonly known as: COREG   Take 25 mg by mouth 2 (two) times daily with a meal.      finasteride 5 MG tablet   Commonly known as: PROSCAR   Take 5 mg by mouth daily.      metFORMIN 500 MG 24 hr tablet   Commonly known as: GLUCOPHAGE-XR   Take 2 tablets (1,000 mg total) by mouth 2 (two) times daily. Please hold for 48hrs after heart catheterization. Resume on 07/15/12      metroNIDAZOLE 500 MG tablet   Commonly known as: FLAGYL   Take 1 tablet (500 mg total) by mouth 3 (three) times daily.      Tamsulosin HCl 0.4 MG Caps   Commonly known  as: FLOMAX   Take 0.4 mg by mouth daily.      Ticagrelor 90 MG Tabs tablet   Commonly known as: BRILINTA   Take 1 tablet (90 mg total) by mouth 2 (two) times daily.         Disposition   Discharge Orders    Future Appointments: Provider: Department: Dept Phone: Center:   07/16/2012 10:25 AM [facility] Main Office [place]) [phone] [facility]   07/27/2012 2:40 PM Lorin Picket Moishe Spice, PA [facility] Main Office [place]) [phone] [facility]     Future Orders Please Complete By Expires   Amb Referral to Cardiac Rehabilitation      Comments:   Pt agrees to referral to  Outpt. CRP.   Diet - low sodium heart healthy      Increase activity slowly      Discharge instructions      Comments:   * Please take all medications as prescribed and bring them with you to your office visit * Please hold Metformin for 48hrs after heart catheterization. Resume on 07/15/12. * You blood pressure medication (Lisinopril) was stopped due to your kidney function. Please monitor your blood pressure at home. You will have blood work to check your kidney function on 07/16/12. * Please follow up with you primary care provider regarding anemia, diabetes, and cholesterol.   * KEEP GROIN SITE CLEAN AND DRY. Call the office for any signs of bleedings, pus, swelling, increased pain, or any other concerns. * NO HEAVY LIFTING (>10lbs) OR SEXUAL ACTIVITY X 7 DAYS. * NO DRIVING X 3-5 DAYS. * NO SOAKING BATHS, HOT TUBS, POOLS, ETC., X 7 DAYS.     Follow-up Information    Schedule an appointment as soon as possible for a visit with PERRY,LAWRENCE EDWARD, MD. (Please follow up regarding diabetes, cholesterol, and anemia)    Contact information:   [address] [place] [address] [phone]       Follow up with [facility]. On 07/16/2012. (Blood work anytime between 7:30-4:30)    Solicitor information:   [address] [place]  [address] [phone]       Follow up with Tereso Newcomer, PA. On 07/27/2012. (2:40)    Contact information:   [facility] [address]  [address] [place] [address] [phone]            Outstanding Labs/Studies:  BMET Guaiac stool CBC  Duration of Discharge Encounter: Greater than 30 minutes including physician and PA time.  Signed, Manha Amato PA-C 07/13/2012, 11:26 AM

## 2012-07-13 NOTE — Progress Notes (Addendum)
CARDIAC REHAB PHASE I   PRE:  Rate/Rhythm: 90 SR  BP:  Supine: 137/57  Sitting:   Standing:    SaO2: 93 RA  MODE:  Ambulation: 410 ft   POST:  Rate/Rhythem: 108 ST  BP:  Supine:   Sitting: 126/62  Standing:    SaO2: 88-89 RA during walk 88 RA after walk 0905-1025 Assisted X 1 and used walker to ambulate. Gait steady with walker. Pt able to walk 410 feet wthtout c/o of cp or SOB. Room air sat during walk 88-89%, after walk 88%. Pt has O2 at home and walker. Encouraged him to use O2 when walking and when sleeping. Completed stent education with pt. He has trouble recalling information with teach back. Reported to RN that we need to review when family arrives. Pt states that his memory is not good. He agrees to [employer]. CRP in [place], will send referral.  Beatrix Fetters

## 2012-07-13 NOTE — Progress Notes (Signed)
Cardiology Progress Note Patient Name: Brandon Clay Date of Encounter: 07/13/2012, 7:44 AM     Subjective  No chest pain or sob. Ambulated without difficulty. Reports dark stools over the last couple of weeks. Also noted not taking statin due to leg muscle aches.   Objective   Telemetry: sinus rhythm 80s, one episode of SVT vs PAF  Medications: . aspirin EC  81 mg Oral QODAY  . budesonide-formoterol  2 puff Inhalation BID  . carvedilol  25 mg Oral BID WC  . clopidogrel  75 mg Oral Q breakfast  . finasteride  5 mg Oral Daily  . insulin aspart  0-5 Units Subcutaneous QHS  . insulin aspart  0-9 Units Subcutaneous TID WC  . metroNIDAZOLE  500 mg Oral TID  . Tamsulosin HCl  0.4 mg Oral Daily      Physical Exam: Temp:  [97.2 F (36.2 C)-98.4 F (36.9 C)] 97.8 F (36.6 C) (02/04 0432) Pulse Rate:  [81-91] 81  (02/04 0434) Resp:  [13-19] 17  (02/04 0434) BP: (102-130)/(28-83) 130/55 mmHg (02/04 0434) SpO2:  [95 %-100 %] 98 % (02/04 0434) Weight:  [179 lb 7.3 oz (81.4 kg)-187 lb (84.823 kg)] 179 lb 7.3 oz (81.4 kg) (02/04 0432)  General: Elderly white male, in no acute distress. Head: Normocephalic, atraumatic, sclera non-icteric, nares are without discharge.  Neck: Supple. Negative for carotid bruits or JVD Lungs: Clear bilaterally to auscultation without wheezes, rales, or rhonchi. Breathing is unlabored. Heart: RRR S1 S2 without murmurs, rubs, or gallops.  Abdomen: Soft, non-tender, non-distended with normoactive bowel sounds. No rebound/guarding. No obvious abdominal masses. Msk:  Strength and tone appear normal for age. Extremities: Right groin site without bleeding or hematoma.Trace bilat ankle edema. No clubbing or cyanosis. Distal pedal pulses are intact and equal bilaterally. Neuro: Alert and oriented X 3. Moves all extremities spontaneously. Psych:  Responds to questions appropriately with a normal affect.   Intake/Output Summary (Last 24 hours) at 07/13/12  0744 Last data filed at 07/12/12 2000  Gross per 24 hour  Intake    150 ml  Output      0 ml  Net    150 ml    Labs:  [facility] 07/13/12 0430 07/12/12 0915  NA 136 133*  K 3.7 3.7  CL 106 103  CO2 19 17*  GLUCOSE 252* 345*  BUN 35* 42*  CREATININE 1.59* 1.78*  CALCIUM 7.7* 7.9*   Basename 07/13/12 0430 07/12/12 0915  WBC 8.2 10.1  HGB 10.3* 10.9*  HCT 29.6* 31.3*  MCV 81.1 81.3  PLT 173 185    Radiology/Studies:   07/02/12 - Cardiac Cath Hemodynamic Findings:  Central aortic pressure: 134/49  Left ventricular pressure: 134/15/23  Angiographic Findings:  Left main: Distal 20% stenosis.  Left Anterior Descending Artery: Large caliber vessel that courses to the apex. The proximal vessel has diffuse plaque disease. The mid vessel has a 60% stenosis just before the diagonal branch. The first diagonal branch is small in caliber and has diffuse disease. The second diagonal is small in caliber and has 99% stenosis.  Circumflex Artery: Large caliber vessel with 99% proximal stenosis. The distal AV groove Circumflex has diffuse 40% stenosis.  Right Coronary Artery: Large dominant vessel with 80% diffuse stenosis proximal vessel. The mid vessel has a 100% stenosis. The distal vessel fills from left to right collaterals.  Left Ventricular Angiogram: LVEF=45%. Inferior wall hypokinesis.  Impression:  1. Triple vessel CAD with chronic occlusion  RCA, severe stenosis proximal Circumflex, moderate stenosis mid LAD.  2. Segmental LV systolic dysfunction  Recommendations: Complex situation in patient with lung cancer and new diagnosis of triple vessel CAD. His RCA is chronically occluded. The proximal Circumflex has a severe stenosis which could be treated percutaneously however this will need to be discussed with Dr. [name] and Dr. Eden Emms in regards to planning the timing with lobectomy. Will discuss possibility of CABG versus stenting of the Circumflex with a bare metal stent which would  require one month of dual anti-platelet therapy. He will go home today and further planning will be done as an outpatient.   07/12/12 - Cardiac Cath Hemodynamic Findings:  Central aortic pressure: 108/49  Impression:  1. Triple vessel CAD with chronic occlusion of RCA (fills from left collaterals), severe stenosis proximal Circumflex and moderately severe disease in the mid LAD.  2. Not a good candidate for combined CABG/lobectomy procedure.  3. Successful PTCA/bare metal stent x 1 proximal Circumflex  Recommendations: We will admit for aggressive hydration overnight. He is felt to be pre-renal from recent diarrhea which has mostly resolved. He will need one month of dual anti-platelet therapy with ASA and Plavix then lobectomy per Dr. [name]. Will send stool for C. Difficile toxin however he has been on Flagyl and diarrhea is mostly resolved. No documented c. Diff colitis.      Assessment and Plan   1. Coronary Artery Disease: Triple vessel CAD with chronic occlusion of RCA (fills from left collaterals), severe stenosis proximal Circumflex and moderately severe disease in the mid LAD. Felt to be too high risk for combined lobectomy and CABG. S/p PTCA/BMS to prox LCX. DAPT w/ ASA and Brilinta x1 month. Cath site ok. Cont BB. Statin intolerant. PRU is high on Plavix consistent with nonresponder.  2. Lung CA: Planned lobectomy with Dr. [name] after one month of DAPT as above.  3. Hyperlipidemia: No lipid panel in EPIC. Not on statin. Patient reports leg myalgias with "all statins". Outpatient f/u with PCP and/or Dr. Eden Emms to discuss alternatives and/or lower dose with CoQ10.  4. Hypertension: BP stable on Coreg. Lisinopril on hold due to elevated Crt. MD advise on timing of resumption.   5. Diabetes Mellitus, Type 2, Uncontrolled: Metformin on hold for another 48hrs. No A1c in EPIC. CBGs high. Outpatient follow up with PCP.  6. Normocytic Anemia: Appears stable. Patient reports black stools  over the last couple of weeks. Guaiac stools.   7. Acute Renal Insufficiency: Crt improving with overnight hydration. Follow up BMET as an outpatient.  8. Rhythm abnormality: One episode of SVT vs PAF @ 0805 this am. Asymptomatic. MD please review.   Signed, HOPE, JESSICA PA-C Patient seen and examined and history reviewed. Agree with above findings and plan. Patient denies any blood in stool. Doubt GI bleed. PRU high consistent with Plavix nonresponder. Will switch to Brilinta 180 mg load then 90 mg bid. ASA 81 mg daily.OK for discharge today. Rhythm reviewed. Appears to me to be NSVT 3 then 12 beats. Asymptomatic. Continue Coreg. Would hold ACEi until renal function reassessed as outpatient.  Theron Arista [facility] 07/13/2012 9:59 AM

## 2012-07-13 NOTE — Discharge Summary (Signed)
Patient seen and examined and history reviewed. Agree with above findings and plan. See earlier rounding note.  Theron Arista [facility] 07/13/2012 1:15 PM

## 2012-07-15 ENCOUNTER — Telehealth: Payer: Self-pay | Admitting: *Deleted

## 2012-07-15 NOTE — Telephone Encounter (Signed)
TCM patient.  Called and has no complaints. Aware of lab appointment tomorrow and office with with Tereso Newcomer Pa on 2/18.

## 2012-07-16 ENCOUNTER — Ambulatory Visit (INDEPENDENT_AMBULATORY_CARE_PROVIDER_SITE_OTHER): Payer: Medicare Other | Admitting: *Deleted

## 2012-07-16 DIAGNOSIS — I10 Essential (primary) hypertension: Secondary | ICD-10-CM

## 2012-07-16 LAB — BASIC METABOLIC PANEL
BUN: 14 mg/dL (ref 6–23)
Chloride: 105 mEq/L (ref 96–112)
Glucose, Bld: 263 mg/dL — ABNORMAL HIGH (ref 70–99)
Potassium: 3.3 mEq/L — ABNORMAL LOW (ref 3.5–5.1)

## 2012-07-24 ENCOUNTER — Inpatient Hospital Stay (HOSPITAL_COMMUNITY)
Admission: EM | Admit: 2012-07-24 | Discharge: 2012-08-03 | DRG: 872 | Disposition: A | Discharge status: Home / Self Care | Payer: Medicare Other | Attending: Internal Medicine | Admitting: Internal Medicine

## 2012-07-24 ENCOUNTER — Inpatient Hospital Stay (HOSPITAL_COMMUNITY): Payer: Medicare Other

## 2012-07-24 ENCOUNTER — Other Ambulatory Visit: Payer: Self-pay

## 2012-07-24 DIAGNOSIS — D638 Anemia in other chronic diseases classified elsewhere: Secondary | ICD-10-CM | POA: Diagnosis present

## 2012-07-24 DIAGNOSIS — Z7982 Long term (current) use of aspirin: Secondary | ICD-10-CM

## 2012-07-24 DIAGNOSIS — N400 Enlarged prostate without lower urinary tract symptoms: Secondary | ICD-10-CM | POA: Diagnosis present

## 2012-07-24 DIAGNOSIS — I2510 Atherosclerotic heart disease of native coronary artery without angina pectoris: Secondary | ICD-10-CM | POA: Diagnosis present

## 2012-07-24 DIAGNOSIS — A419 Sepsis, unspecified organism: Principal | ICD-10-CM | POA: Diagnosis present

## 2012-07-24 DIAGNOSIS — I10 Essential (primary) hypertension: Secondary | ICD-10-CM | POA: Diagnosis present

## 2012-07-24 DIAGNOSIS — I119 Hypertensive heart disease without heart failure: Secondary | ICD-10-CM | POA: Diagnosis present

## 2012-07-24 DIAGNOSIS — E119 Type 2 diabetes mellitus without complications: Secondary | ICD-10-CM | POA: Diagnosis present

## 2012-07-24 DIAGNOSIS — E1159 Type 2 diabetes mellitus with other circulatory complications: Secondary | ICD-10-CM | POA: Diagnosis present

## 2012-07-24 DIAGNOSIS — J449 Chronic obstructive pulmonary disease, unspecified: Secondary | ICD-10-CM

## 2012-07-24 DIAGNOSIS — E7800 Pure hypercholesterolemia, unspecified: Secondary | ICD-10-CM | POA: Diagnosis present

## 2012-07-24 DIAGNOSIS — C3410 Malignant neoplasm of upper lobe, unspecified bronchus or lung: Secondary | ICD-10-CM | POA: Diagnosis present

## 2012-07-24 DIAGNOSIS — I4891 Unspecified atrial fibrillation: Secondary | ICD-10-CM | POA: Diagnosis present

## 2012-07-24 DIAGNOSIS — R531 Weakness: Secondary | ICD-10-CM

## 2012-07-24 DIAGNOSIS — J439 Emphysema, unspecified: Secondary | ICD-10-CM | POA: Diagnosis present

## 2012-07-24 DIAGNOSIS — Z87891 Personal history of nicotine dependence: Secondary | ICD-10-CM

## 2012-07-24 DIAGNOSIS — E785 Hyperlipidemia, unspecified: Secondary | ICD-10-CM | POA: Diagnosis present

## 2012-07-24 DIAGNOSIS — R5383 Other fatigue: Secondary | ICD-10-CM

## 2012-07-24 DIAGNOSIS — Z809 Family history of malignant neoplasm, unspecified: Secondary | ICD-10-CM

## 2012-07-24 DIAGNOSIS — R338 Other retention of urine: Secondary | ICD-10-CM | POA: Diagnosis present

## 2012-07-24 DIAGNOSIS — Z9861 Coronary angioplasty status: Secondary | ICD-10-CM

## 2012-07-24 DIAGNOSIS — A0472 Enterocolitis due to Clostridium difficile, not specified as recurrent: Secondary | ICD-10-CM | POA: Diagnosis present

## 2012-07-24 DIAGNOSIS — E860 Dehydration: Secondary | ICD-10-CM | POA: Diagnosis present

## 2012-07-24 DIAGNOSIS — C3490 Malignant neoplasm of unspecified part of unspecified bronchus or lung: Secondary | ICD-10-CM | POA: Insufficient documentation

## 2012-07-24 DIAGNOSIS — R197 Diarrhea, unspecified: Secondary | ICD-10-CM | POA: Diagnosis present

## 2012-07-24 DIAGNOSIS — J4489 Other specified chronic obstructive pulmonary disease: Secondary | ICD-10-CM | POA: Diagnosis present

## 2012-07-24 DIAGNOSIS — E876 Hypokalemia: Secondary | ICD-10-CM | POA: Diagnosis present

## 2012-07-24 DIAGNOSIS — D72829 Elevated white blood cell count, unspecified: Secondary | ICD-10-CM | POA: Diagnosis present

## 2012-07-24 HISTORY — DX: Cardiomegaly: I51.7

## 2012-07-24 LAB — CBC WITH DIFFERENTIAL/PLATELET
Basophils Absolute: 0 10*3/uL (ref 0.0–0.1)
Eosinophils Relative: 0 % (ref 0–5)
HCT: 32.1 % — ABNORMAL LOW (ref 39.0–52.0)
Hemoglobin: 10.6 g/dL — ABNORMAL LOW (ref 13.0–17.0)
Lymphocytes Relative: 7 % — ABNORMAL LOW (ref 12–46)
MCHC: 33 g/dL (ref 30.0–36.0)
MCV: 84.9 fL (ref 78.0–100.0)
Monocytes Absolute: 1.2 10*3/uL — ABNORMAL HIGH (ref 0.1–1.0)
Monocytes Relative: 7 % (ref 3–12)
RDW: 17.5 % — ABNORMAL HIGH (ref 11.5–15.5)
WBC: 16.5 10*3/uL — ABNORMAL HIGH (ref 4.0–10.5)

## 2012-07-24 LAB — COMPREHENSIVE METABOLIC PANEL
AST: 9 U/L (ref 0–37)
BUN: 16 mg/dL (ref 6–23)
CO2: 19 mEq/L (ref 19–32)
Calcium: 8 mg/dL — ABNORMAL LOW (ref 8.4–10.5)
Creatinine, Ser: 1.04 mg/dL (ref 0.50–1.35)
GFR calc Af Amer: 79 mL/min — ABNORMAL LOW (ref >90)
GFR calc non Af Amer: 68 mL/min — ABNORMAL LOW (ref >90)
Total Bilirubin: 0.4 mg/dL (ref 0.3–1.2)

## 2012-07-24 LAB — TROPONIN I
Troponin I: <0.3 ng/mL (ref <0.30)
Troponin I: <0.3 ng/mL (ref <0.30)

## 2012-07-24 LAB — PHOSPHORUS: Phosphorus: 2.8 mg/dL (ref 2.3–4.6)

## 2012-07-24 MED ORDER — FINASTERIDE 5 MG PO TABS
5.0000 mg | ORAL_TABLET | Freq: Every morning | ORAL | Status: DC
  Administered 2012-07-25 – 2012-08-03 (×9): 5 mg via ORAL
  Filled 2012-07-24 (×10): qty 1

## 2012-07-24 MED ORDER — TICAGRELOR 90 MG PO TABS
90.0000 mg | ORAL_TABLET | Freq: Two times a day (BID) | ORAL | Status: DC
  Administered 2012-07-24 – 2012-08-03 (×19): 90 mg via ORAL
  Filled 2012-07-24 (×22): qty 1

## 2012-07-24 MED ORDER — SODIUM CHLORIDE 0.9 % IV SOLN
INTRAVENOUS | Status: DC
  Administered 2012-07-24: 75 mL/h via INTRAVENOUS

## 2012-07-24 MED ORDER — ONDANSETRON HCL 4 MG/2ML IJ SOLN: 4.0000 mg | Freq: Once | INTRAMUSCULAR | Status: DC

## 2012-07-24 MED ORDER — IOHEXOL 300 MG/ML  SOLN
50.0000 mL | Freq: Once | INTRAMUSCULAR | Status: AC | PRN
  Administered 2012-07-24: 50 mL via ORAL

## 2012-07-24 MED ORDER — ASPIRIN EC 81 MG PO TBEC
81.0000 mg | DELAYED_RELEASE_TABLET | ORAL | Status: DC
  Administered 2012-07-25 – 2012-08-02 (×4): 81 mg via ORAL
  Filled 2012-07-24 (×5): qty 1

## 2012-07-24 MED ORDER — SODIUM CHLORIDE 0.9 % IJ SOLN
3.0000 mL | Freq: Two times a day (BID) | INTRAMUSCULAR | Status: DC
  Administered 2012-07-24 – 2012-08-03 (×14): 3 mL via INTRAVENOUS

## 2012-07-24 MED ORDER — BUDESONIDE-FORMOTEROL FUMARATE 160-4.5 MCG/ACT IN AERO
2.0000 | INHALATION_SPRAY | Freq: Two times a day (BID) | RESPIRATORY_TRACT | Status: DC
  Administered 2012-07-25 – 2012-08-03 (×17): 2 via RESPIRATORY_TRACT
  Filled 2012-07-24 (×5): qty 6

## 2012-07-24 MED ORDER — ONDANSETRON HCL 4 MG PO TABS: 4.0000 mg | ORAL_TABLET | Freq: Four times a day (QID) | ORAL | Status: DC | PRN

## 2012-07-24 MED ORDER — CARVEDILOL 25 MG PO TABS
25.0000 mg | ORAL_TABLET | Freq: Two times a day (BID) | ORAL | Status: DC
  Administered 2012-07-24 – 2012-07-25 (×2): 25 mg via ORAL
  Filled 2012-07-24 (×4): qty 1

## 2012-07-24 MED ORDER — ONDANSETRON HCL 4 MG/2ML IJ SOLN
4.0000 mg | Freq: Once | INTRAMUSCULAR | Status: AC
  Administered 2012-07-24: 4 mg via INTRAVENOUS
  Filled 2012-07-24: qty 2

## 2012-07-24 MED ORDER — METRONIDAZOLE IN NACL 5-0.79 MG/ML-% IV SOLN
500.0000 mg | Freq: Three times a day (TID) | INTRAVENOUS | Status: DC
  Filled 2012-07-24: qty 100

## 2012-07-24 MED ORDER — ONDANSETRON HCL 4 MG/2ML IJ SOLN: 4.0000 mg | Freq: Four times a day (QID) | INTRAMUSCULAR | Status: DC | PRN

## 2012-07-24 MED ORDER — CIPROFLOXACIN IN D5W 200 MG/100ML IV SOLN
200.0000 mg | Freq: Two times a day (BID) | INTRAVENOUS | Status: DC
  Administered 2012-07-24 – 2012-07-25 (×2): 200 mg via INTRAVENOUS
  Filled 2012-07-24 (×2): qty 100

## 2012-07-24 MED ORDER — SODIUM CHLORIDE 0.9 % IV BOLUS (SEPSIS)
250.0000 mL | Freq: Once | INTRAVENOUS | Status: AC
  Administered 2012-07-24: 250 mL via INTRAVENOUS

## 2012-07-24 MED ORDER — INSULIN ASPART 100 UNIT/ML ~~LOC~~ SOLN
0.0000 [IU] | Freq: Three times a day (TID) | SUBCUTANEOUS | Status: DC
Start: 2012-07-25 — End: 2012-08-03
  Administered 2012-07-25: 3 [IU] via SUBCUTANEOUS
  Administered 2012-07-25 – 2012-07-26 (×2): 2 [IU] via SUBCUTANEOUS
  Administered 2012-07-26 (×2): 3 [IU] via SUBCUTANEOUS
  Administered 2012-07-27: 2 [IU] via SUBCUTANEOUS
  Administered 2012-07-27 (×2): 3 [IU] via SUBCUTANEOUS
  Administered 2012-07-28: 2 [IU] via SUBCUTANEOUS
  Administered 2012-07-28: 3 [IU] via SUBCUTANEOUS
  Administered 2012-07-28: 2 [IU] via SUBCUTANEOUS
  Administered 2012-07-29: 3 [IU] via SUBCUTANEOUS
  Administered 2012-07-29: 2 [IU] via SUBCUTANEOUS
  Administered 2012-07-30 (×2): 3 [IU] via SUBCUTANEOUS
  Administered 2012-07-30: 2 [IU] via SUBCUTANEOUS
  Administered 2012-07-31: 3 [IU] via SUBCUTANEOUS
  Administered 2012-08-01: 1 [IU] via SUBCUTANEOUS
  Administered 2012-08-01 – 2012-08-02 (×3): 2 [IU] via SUBCUTANEOUS
  Administered 2012-08-02: 5 [IU] via SUBCUTANEOUS
  Administered 2012-08-03: 2 [IU] via SUBCUTANEOUS

## 2012-07-24 MED ORDER — SODIUM CHLORIDE 0.9 % IV BOLUS (SEPSIS): 1000.0000 mL | Freq: Once | INTRAVENOUS | Status: DC

## 2012-07-24 MED ORDER — DILTIAZEM HCL 100 MG IV SOLR
5.0000 mg/h | INTRAVENOUS | Status: DC
  Administered 2012-07-24: 5 mg/h via INTRAVENOUS
  Administered 2012-07-25: 10 mg/h via INTRAVENOUS

## 2012-07-24 MED ORDER — POTASSIUM CHLORIDE CRYS ER 20 MEQ PO TBCR
20.0000 meq | EXTENDED_RELEASE_TABLET | Freq: Once | ORAL | Status: AC
  Administered 2012-07-25: 20 meq via ORAL
  Filled 2012-07-24: qty 1

## 2012-07-24 MED ORDER — HYDROCODONE-ACETAMINOPHEN 5-325 MG PO TABS
1.0000 | ORAL_TABLET | ORAL | Status: DC | PRN
  Administered 2012-07-26: 2 via ORAL
  Filled 2012-07-24: qty 2

## 2012-07-24 MED ORDER — ENOXAPARIN SODIUM 40 MG/0.4ML ~~LOC~~ SOLN
40.0000 mg | SUBCUTANEOUS | Status: DC
  Administered 2012-07-24: 40 mg via SUBCUTANEOUS
  Filled 2012-07-24 (×2): qty 0.4

## 2012-07-24 MED ORDER — SODIUM CHLORIDE 0.9 % IV BOLUS (SEPSIS)
1000.0000 mL | Freq: Once | INTRAVENOUS | Status: DC
Start: 2012-07-24 — End: 2012-07-24

## 2012-07-24 MED ORDER — DILTIAZEM HCL 50 MG/10ML IV SOLN
5.0000 mg | Freq: Once | INTRAVENOUS | Status: AC
  Administered 2012-07-24: 5 mg via INTRAVENOUS

## 2012-07-24 MED ORDER — POTASSIUM CHLORIDE CRYS ER 20 MEQ PO TBCR: 20.0000 meq | EXTENDED_RELEASE_TABLET | Freq: Once | ORAL | Status: DC

## 2012-07-24 MED ORDER — METRONIDAZOLE IN NACL 5-0.79 MG/ML-% IV SOLN
500.0000 mg | Freq: Three times a day (TID) | INTRAVENOUS | Status: DC
  Administered 2012-07-25 (×2): 500 mg via INTRAVENOUS
  Filled 2012-07-24 (×3): qty 100

## 2012-07-24 MED ORDER — TAMSULOSIN HCL 0.4 MG PO CAPS
0.4000 mg | ORAL_CAPSULE | Freq: Every morning | ORAL | Status: DC
  Administered 2012-07-25 – 2012-08-03 (×9): 0.4 mg via ORAL
  Filled 2012-07-24 (×10): qty 1

## 2012-07-24 NOTE — ED Notes (Signed)
ZOX:[id number]<AV> Expected date:07/24/12<BR> Expected time: 4:42 PM<BR> Means of arrival:<BR> Comments:<BR> A fib

## 2012-07-24 NOTE — ED Provider Notes (Signed)
History     CSN: [id number]  Arrival date & time 07/24/12  1716   First MD Initiated Contact with Patient 07/24/12 1720      Chief Complaint  Patient presents with  . Atrial Fibrillation    weakness    (Consider location/radiation/quality/duration/timing/severity/associated sxs/prior treatment) Patient is a 78 y.o. male presenting with atrial fibrillation.  Atrial Fibrillation  .... weakness and diarrhea for the past 24 hours. Patient diagnosed with lung cancer in January 2014. Today he was too weak to stand. Nothing makes symptoms better or worse. It is also noted to be in rapid atrial fibrillation. Severity is moderate to severe. No chest pain, dyspnea, fever, chills, dysuria, meningeal signs .  Level V caveat for severe illness  Past Medical History  Diagnosis Date  . Hypertension   . Hypercholesterolemia     statin intolerant  . Diabetes mellitus, type 2   . Benign prostatic hypertrophy   . Emphysema   . Lung cancer     Followed by Dr. [name] (LUL squamous cell carcinoma)  . COPD (chronic obstructive pulmonary disease)   . CKD (chronic kidney disease)   . Colitis   . Coronary artery disease     Catj 07/02/12 3v CAD w/ chronic occlusion of RCA (fills from left collaterals), severe stenosis prox LCx & moderately severe dz in mid LAD; s/p PTCA/BMS to prox LCx 07/12/12   . Normocytic anemia     Past Surgical History  Procedure Laterality Date  . Hemorrhoid surgery      Family History  Problem Relation Age of Onset  . Stroke Mother   . Heart disease Father     died of heart attack  . Lung cancer Brother   . Hypertension Brother   . Hypothyroidism Brother   . Hyperlipidemia Sister   . Hyperlipidemia Sister   . Breast cancer Sister     History  Substance Use Topics  . Smoking status: Former Smoker -- 0.50 packs/day for 50 years    Types: Cigarettes    Start date: 06/09/1958    Quit date: 06/10/2007  . Smokeless tobacco: Never Used  . Alcohol Use: No       Review of Systems  Unable to perform ROS: Acuity of condition    Allergies  Review of patient's allergies indicates no known allergies.  Home Medications   Current Outpatient Rx  Name  Route  Sig  Dispense  Refill  . aspirin EC 81 MG tablet   Oral   Take 81 mg by mouth every other day.         . budesonide-formoterol (SYMBICORT) 160-4.5 MCG/ACT inhaler   Inhalation   Inhale 2 puffs into the lungs 2 (two) times daily.         . carvedilol (COREG) 25 MG tablet   Oral   Take 25 mg by mouth 2 (two) times daily with a meal.          . finasteride (PROSCAR) 5 MG tablet   Oral   Take 5 mg by mouth every morning.          . metFORMIN (GLUCOPHAGE-XR) 500 MG 24 hr tablet   Oral   Take 2 tablets (1,000 mg total) by mouth 2 (two) times daily. Please hold for 48hrs after heart catheterization. Resume on 07/15/12         . metroNIDAZOLE (FLAGYL) 500 MG tablet   Oral   Take 1 tablet (500 mg total) by mouth 3 (three) times daily.  30 tablet   0   . Tamsulosin HCl (FLOMAX) 0.4 MG CAPS   Oral   Take 0.4 mg by mouth every morning.          . Ticagrelor (BRILINTA) 90 MG TABS tablet   Oral   Take 1 tablet (90 mg total) by mouth 2 (two) times daily.   60 tablet   0     BP 99/74  Pulse 126  Temp(Src) 101.3 F (38.5 C) (Oral)  Resp 20  SpO2 99%  Physical Exam  Nursing note and vitals reviewed. Constitutional: He is oriented to person, place, and time. He appears well-developed and well-nourished.  Pale, weak, tachycardic  HENT:  Head: Normocephalic and atraumatic.  Eyes: Conjunctivae and EOM are normal. Pupils are equal, round, and reactive to light.  Neck: Normal range of motion. Neck supple.  Cardiovascular: Normal heart sounds.   Irregularly irregular  Pulmonary/Chest: Effort normal and breath sounds normal.  Abdominal: Soft. Bowel sounds are normal.  Musculoskeletal: Normal range of motion.  Neurological: He is alert and oriented to person, place,  and time.  Skin: Skin is warm and dry.  Psychiatric: He has a normal mood and affect.    ED Course  Procedures (including critical care time)  Labs Reviewed  CBC WITH DIFFERENTIAL - Abnormal; Notable for the following:    WBC 16.5 (*)    RBC 3.78 (*)    Hemoglobin 10.6 (*)    HCT 32.1 (*)    RDW 17.5 (*)    Neutrophils Relative 86 (*)    Neutro Abs 14.1 (*)    Lymphocytes Relative 7 (*)    Monocytes Absolute 1.2 (*)    All other components within normal limits  COMPREHENSIVE METABOLIC PANEL - Abnormal; Notable for the following:    Potassium 3.3 (*)    Glucose, Bld 187 (*)    Calcium 8.0 (*)    Total Protein 5.8 (*)    Albumin 2.4 (*)    GFR calc non Af Amer 68 (*)    GFR calc Af Amer 79 (*)    All other components within normal limits  TROPONIN I  URINALYSIS, ROUTINE W REFLEX MICROSCOPIC   No results found.   1. Weakness   2. Atrial fibrillation   3. Diarrhea   4. Lung cancer     CRITICAL CARE Performed by: Donnetta Hutching   Total critical care time: 30  Critical care time was exclusive of separately billable procedures and treating other patients.  Critical care was necessary to treat or prevent imminent or life-threatening deterioration.  Critical care was time spent personally by me on the following activities: development of treatment plan with patient and/or surrogate as well as nursing, discussions with consultants, evaluation of patient's response to treatment, examination of patient, obtaining history from patient or surrogate, ordering and performing treatments and interventions, ordering and review of laboratory studies, ordering and review of radiographic studies, pulse oximetry and re-evaluation of patient's condition.    MDM    Patient has a host of health problems including lung cancer, atrial fibrillation, diarrhea, weakness.  IV fluids and Cardizem initiated.  Discussed with daughter and wife.  Admit to hospitalist      Donnetta Hutching,  MD 07/24/12 2123

## 2012-07-24 NOTE — ED Notes (Signed)
Requested urine from pt

## 2012-07-24 NOTE — ED Notes (Signed)
Pt to go back Tuesday for follow up from stent

## 2012-07-24 NOTE — ED Notes (Signed)
Pt states he has a hX of AFIb

## 2012-07-24 NOTE — H&P (Addendum)
[facility] History and Physical  Brandon Clay GNF:[id number] DOB: June 07, 1935 DOA: 07/24/2012  Referring physician: ER physician PCP: [name], MD   Chief Complaint: Diarrhea  HPI:  Pt is 78 yo male with multiple and complex medical history including CAD, 3 vessels w/ chronic occlusion of RCA, severe stenosis prox LCx & moderately severe dz in mid LAD, S/P PTCA/BMS to prox LCx, DAPT with ASA and Brilinta x1 month (Plavix nonresponder), LUL squamous lung cancer and with planned lobectomy by Dr. [name] after one month of DAPT, now presenting to [facility] ED with main concern of persistent and ongoing non watery diarrhea of 2 days in durations, associated with crampy abdominal pain in the epigastric area, one episode of non bloody vomiting, subjective fevers, chills, poor oral intake. He denies any specific chest pain, no new cough, no urinary concerns, no other systemic symptoms.   In ED, pt found to be in atrial fibrillation and HR 100-180, started on diltiazem drip and [facility] asked to admit for further evaluation.  Principal Problem:   Diarrhea, vomiting, abdominal pain  unclear etiology at this time and possibly related to an infectious etiology  will check C. Diff by PCR, influenza panel, stool for O&P  will check CT abd/pelvis, rule out diverticulitis  start empiric Cipro and Flagyl IV and discontinue in AM if not indicated  supportive care with IVF, analgesia and antiemetics as needed  follow up on urine analysis and urine culture Active Problems:   Atrial fibrillation with RVR  pt apparently has history of a-fib  will admit to telemetry floor  obtain CXR, CE's x 3 sets, 12 lead EKG, TSH  continue Cardizem drip for now and titrate to keep HR 60 -100  plan on transitioning to PO Cardizem in AM  consider cardio consult in AM if further assistance needed with management   Leukocytosis  likely secondary to principal problem and ? Infectious  etiology  management as above  CBC in AM   Hypokalemia  secondary to diarrhea  will supplement via oral route  BMP in AM   Hypertension  pt was on Lisinopril in the past but has been recently diagnosed with acute renal failure which was thought to be secondary to dehydration  lisinopril was discontinued  pt currently on Coreg   Hypercholesterolemia  statin intolerant   Diabetes, type II and with complications  will check A1C and will place temporarily on SSI  hold metformin in the setting of diarrhea   Squamous cell lung cancer left upper lobe  scheduled for planned lobectomy by Dr. [name] in 1 month after aspirin and DAPT   COPD (chronic obstructive pulmonary disease)  this appears to be clinically stable  pt maintaining oxygen saturations at target range  oxygen will be provided if needed  continue Symbicort    CAD (coronary artery disease), native coronary artery  pt currently hemodynamically stable   continue aspirin and Brilinta as previously recommended by pt's cardiologist  will ask primary team to consult cardiology team in AM if needed   Anemia of chronic disease  stable Hgb and Hct and at pt's baseline   Benign prostatic hypertrophy  continue Finasteride and Flomax   Code Status: Full Family Communication: Pt at bedside Disposition Plan: Admit for further evaluation, telemetry bed  [name], MD  [facility] Pager [phone]  If 7PM-7AM, please contact night-coverage [email or web address] Password [email or web address] 07/24/2012, 9:16 PM   Review of Systems:  Constitutional: Positive for fever, chills and malaise/fatigue. Negative for diaphoresis.  HENT: Negative for hearing loss, ear pain, nosebleeds, congestion, sore throat, neck pain, tinnitus and ear discharge.   Eyes: Negative for blurred vision, double vision, photophobia, pain, discharge and redness.  Respiratory: Negative for cough, hemoptysis, sputum production, shortness of breath, wheezing and stridor.    Cardiovascular: Negative for chest pain, palpitations, orthopnea, claudication and leg swelling.  Gastrointestinal: Positive for nausea, vomiting and abdominal pain. Negative for heartburn, constipation, blood in stool. Genitourinary: Negative for dysuria, urgency, frequency, hematuria and flank pain.  Musculoskeletal: Negative for myalgias, back pain, joint pain and falls.  Skin: Negative for itching and rash.  Neurological: Negative for dizziness and weakness. Negative for tingling, tremors, sensory change, speech change, focal weakness, loss of consciousness and headaches.  Endo/Heme/Allergies: Negative for environmental allergies and polydipsia. Does not bruise/bleed easily.  Psychiatric/Behavioral: Negative for suicidal ideas. The patient is not nervous/anxious.      Past Medical History  Diagnosis Date  . Hypertension   . Hypercholesterolemia     statin intolerant  . Diabetes mellitus, type 2   . Benign prostatic hypertrophy   . Emphysema   . Lung cancer     Followed by Dr. [name] (LUL squamous cell carcinoma)  . COPD (chronic obstructive pulmonary disease)   . CKD (chronic kidney disease)   . Colitis   . Coronary artery disease     Catj 07/02/12 3v CAD w/ chronic occlusion of RCA (fills from left collaterals), severe stenosis prox LCx & moderately severe dz in mid LAD; s/p PTCA/BMS to prox LCx 07/12/12   . Normocytic anemia    Past Surgical History  Procedure Laterality Date  . Hemorrhoid surgery     Social History:  reports that he quit smoking about 5 years ago. His smoking use included Cigarettes. He started smoking about 54 years ago. He has a 25 pack-year smoking history. He has never used smokeless tobacco. He reports that he does not drink alcohol or use illicit drugs.  No Known Allergies  Family History:  Family History  Problem Relation Age of Onset  . Stroke Mother   . Heart disease Father     died of heart attack  . Lung cancer Brother   . Hypertension  Brother   . Hypothyroidism Brother   . Hyperlipidemia Sister   . Hyperlipidemia Sister   . Breast cancer Sister      Prior to Admission medications   Medication Sig Start Date End Date Taking? Authorizing Provider  aspirin EC 81 MG tablet Take 81 mg by mouth every other day.   Yes Historical Provider, MD  budesonide-formoterol (SYMBICORT) 160-4.5 MCG/ACT inhaler Inhale 2 puffs into the lungs 2 (two) times daily.   Yes Historical Provider, MD  carvedilol (COREG) 25 MG tablet Take 25 mg by mouth 2 (two) times daily with a meal.  06/13/12  Yes Historical Provider, MD  finasteride (PROSCAR) 5 MG tablet Take 5 mg by mouth every morning.  05/31/12  Yes Historical Provider, MD  metFORMIN (GLUCOPHAGE-XR) 500 MG 24 hr tablet Take 2 tablets (1,000 mg total) by mouth 2 (two) times daily. Please hold for 48hrs after heart catheterization. Resume on 07/15/12 07/13/12  Yes Jessica A Hope, PA-C  metroNIDAZOLE (FLAGYL) 500 MG tablet Take 1 tablet (500 mg total) by mouth 3 (three) times daily. 07/04/12  Yes Benny Lennert, MD  Tamsulosin HCl (FLOMAX) 0.4 MG CAPS Take 0.4 mg by mouth every morning.  05/31/12  Yes Historical Provider, MD  Ticagrelor (BRILINTA) 90 MG TABS tablet Take 1  tablet (90 mg total) by mouth 2 (two) times daily. 07/13/12  Yes Jory Sims, PA-C   Physical Exam: Filed Vitals:   07/24/12 2017 07/24/12 2023 07/24/12 2030 07/24/12 2045  BP: 123/70 119/91 118/80 99/74  Pulse: 132 134 131 126  Temp: 101.3 F (38.5 C)     TempSrc: Oral     Resp: 19 20 20    SpO2: 100% 100%  99%    Physical Exam  Constitutional: Appears well-developed and well-nourished. No distress.  HENT: Normocephalic. External right and left ear normal. Oropharynx is clear and moist.  Eyes: Conjunctivae and EOM are normal. PERRLA, no scleral icterus.  Neck: Normal ROM. Neck supple. No JVD. No tracheal deviation. No thyromegaly.  CVS: IRRR, S1/S2 +, no gallops, no carotid bruit.  Pulmonary: Effort and breath sounds  normal, mild bibasilar crackles and decreased breath sounds at bases  Abdominal: Soft. BS +,  no distension, tenderness in epigastric area with voluntary guarding.  Musculoskeletal: Normal range of motion. No edema and no tenderness.  Lymphadenopathy: No lymphadenopathy noted, cervical, inguinal. Neuro: Alert. Normal reflexes, muscle tone coordination. No cranial nerve deficit. Skin: Skin is warm and dry. No rash noted. Not diaphoretic. No erythema. No pallor.  Psychiatric: Normal mood and affect. Behavior, judgment, thought content normal.   Labs on Admission:  Basic Metabolic Panel:  Recent Labs Lab 07/24/12 1800  NA 135  K 3.3*  CL 101  CO2 19  GLUCOSE 187*  BUN 16  CREATININE 1.04  CALCIUM 8.0*   Liver Function Tests:  Recent Labs Lab 07/24/12 1800  AST 9  ALT 10  ALKPHOS 46  BILITOT 0.4  PROT 5.8*  ALBUMIN 2.4*   CBC:  Recent Labs Lab 07/24/12 1800  WBC 16.5*  NEUTROABS 14.1*  HGB 10.6*  HCT 32.1*  MCV 84.9  PLT 151   Cardiac Enzymes:  Recent Labs Lab 07/24/12 1800  TROPONINI <0.30   Radiological Exams on Admission: No results found.  Atrial fibrillation with RVR rate 135  Time spent: 75 minutes

## 2012-07-24 NOTE — ED Notes (Signed)
Pt called EMS for weakness and diarrhea starting today. Pt vomited 1 time earlier. Pt placwed on heart monitor and show AFIB 10 mg Cardizem given in route. Not change noted. Pt rhythm ranging from 100-180. CBG =180

## 2012-07-24 NOTE — ED Notes (Signed)
Pt wife states pt was dx with lung CA in December 2013. Pt had stent placed Jul 12 2012

## 2012-07-25 ENCOUNTER — Encounter (HOSPITAL_COMMUNITY): Payer: Self-pay | Admitting: *Deleted

## 2012-07-25 DIAGNOSIS — A0472 Enterocolitis due to Clostridium difficile, not specified as recurrent: Secondary | ICD-10-CM | POA: Diagnosis present

## 2012-07-25 DIAGNOSIS — C3490 Malignant neoplasm of unspecified part of unspecified bronchus or lung: Secondary | ICD-10-CM

## 2012-07-25 DIAGNOSIS — E876 Hypokalemia: Secondary | ICD-10-CM

## 2012-07-25 DIAGNOSIS — I2510 Atherosclerotic heart disease of native coronary artery without angina pectoris: Secondary | ICD-10-CM | POA: Diagnosis present

## 2012-07-25 DIAGNOSIS — I4891 Unspecified atrial fibrillation: Secondary | ICD-10-CM | POA: Diagnosis present

## 2012-07-25 DIAGNOSIS — I959 Hypotension, unspecified: Secondary | ICD-10-CM

## 2012-07-25 DIAGNOSIS — J449 Chronic obstructive pulmonary disease, unspecified: Secondary | ICD-10-CM

## 2012-07-25 DIAGNOSIS — A419 Sepsis, unspecified organism: Secondary | ICD-10-CM | POA: Diagnosis present

## 2012-07-25 LAB — TROPONIN I
Troponin I: <0.3 ng/mL (ref <0.30)
Troponin I: <0.3 ng/mL (ref <0.30)

## 2012-07-25 LAB — URINE MICROSCOPIC-ADD ON

## 2012-07-25 LAB — HEPARIN LEVEL (UNFRACTIONATED): Heparin Unfractionated: <0.1 IU/mL — ABNORMAL LOW (ref 0.30–0.70)

## 2012-07-25 LAB — URINALYSIS, ROUTINE W REFLEX MICROSCOPIC
Hgb urine dipstick: NEGATIVE
Leukocytes, UA: NEGATIVE
Nitrite: NEGATIVE
Protein, ur: 100 mg/dL — AB
Specific Gravity, Urine: >1.046 — ABNORMAL HIGH (ref 1.005–1.030)
Urobilinogen, UA: 0.2 mg/dL (ref 0.0–1.0)

## 2012-07-25 LAB — BASIC METABOLIC PANEL
CO2: 21 mEq/L (ref 19–32)
Calcium: 8.1 mg/dL — ABNORMAL LOW (ref 8.4–10.5)
Creatinine, Ser: 1.15 mg/dL (ref 0.50–1.35)

## 2012-07-25 LAB — HEMOGLOBIN A1C
Hgb A1c MFr Bld: 7.4 % — ABNORMAL HIGH (ref <5.7)
Mean Plasma Glucose: 166 mg/dL — ABNORMAL HIGH (ref <117)

## 2012-07-25 LAB — GLUCOSE, CAPILLARY: Glucose-Capillary: 185 mg/dL — ABNORMAL HIGH (ref 70–99)

## 2012-07-25 LAB — CBC
MCV: 84.6 fL (ref 78.0–100.0)
Platelets: 167 10*3/uL (ref 150–400)
RDW: 17.7 % — ABNORMAL HIGH (ref 11.5–15.5)
WBC: 16.6 10*3/uL — ABNORMAL HIGH (ref 4.0–10.5)

## 2012-07-25 LAB — APTT: aPTT: 34 seconds (ref 24–37)

## 2012-07-25 LAB — INFLUENZA PANEL BY PCR (TYPE A & B): Influenza B By PCR: NEGATIVE

## 2012-07-25 LAB — MRSA PCR SCREENING: MRSA by PCR: NEGATIVE

## 2012-07-25 MED ORDER — HEPARIN BOLUS VIA INFUSION
4000.0000 [IU] | Freq: Once | INTRAVENOUS | Status: AC
Start: 2012-07-25 — End: 2012-07-25
  Administered 2012-07-25: 4000 [IU] via INTRAVENOUS
  Filled 2012-07-25: qty 4000

## 2012-07-25 MED ORDER — SODIUM CHLORIDE 0.9 % IV BOLUS (SEPSIS)
1000.0000 mL | Freq: Once | INTRAVENOUS | Status: AC
  Administered 2012-07-25: 1000 mL via INTRAVENOUS

## 2012-07-25 MED ORDER — SODIUM CHLORIDE 0.9 % IV SOLN
INTRAVENOUS | Status: DC
  Administered 2012-07-25 (×2): via INTRAVENOUS
  Administered 2012-07-26: 150 mL via INTRAVENOUS

## 2012-07-25 MED ORDER — METRONIDAZOLE IN NACL 5-0.79 MG/ML-% IV SOLN
500.0000 mg | Freq: Three times a day (TID) | INTRAVENOUS | Status: DC
Start: 2012-07-25 — End: 2012-07-30
  Administered 2012-07-25 – 2012-07-30 (×15): 500 mg via INTRAVENOUS
  Filled 2012-07-25 (×18): qty 100

## 2012-07-25 MED ORDER — SODIUM CHLORIDE 0.9 % IV BOLUS (SEPSIS)
500.0000 mL | Freq: Once | INTRAVENOUS | Status: AC
  Administered 2012-07-25: 500 mL via INTRAVENOUS

## 2012-07-25 MED ORDER — DIGOXIN 0.25 MG/ML IJ SOLN
0.2500 mg | Freq: Four times a day (QID) | INTRAMUSCULAR | Status: AC
  Administered 2012-07-25 (×3): 0.25 mg via INTRAVENOUS
  Filled 2012-07-25 (×3): qty 1

## 2012-07-25 MED ORDER — HEPARIN (PORCINE) IN NACL 100-0.45 UNIT/ML-% IJ SOLN
1150.0000 [IU]/h | INTRAMUSCULAR | Status: DC
  Administered 2012-07-25: 1000 [IU]/h via INTRAVENOUS
  Filled 2012-07-25: qty 250

## 2012-07-25 MED ORDER — VANCOMYCIN 50 MG/ML ORAL SOLUTION
125.0000 mg | Freq: Four times a day (QID) | ORAL | Status: DC
  Administered 2012-07-25 – 2012-08-03 (×37): 125 mg via ORAL
  Filled 2012-07-25 (×45): qty 2.5

## 2012-07-25 MED ORDER — METOPROLOL TARTRATE 1 MG/ML IV SOLN: 5.0000 mg | Freq: Four times a day (QID) | INTRAVENOUS | Status: DC | PRN

## 2012-07-25 MED ORDER — HEPARIN (PORCINE) IN NACL 100-0.45 UNIT/ML-% IJ SOLN
1300.0000 [IU]/h | INTRAMUSCULAR | Status: DC
  Administered 2012-07-26: 1300 [IU]/h via INTRAVENOUS
  Filled 2012-07-25 (×2): qty 250

## 2012-07-25 MED ORDER — IOHEXOL 300 MG/ML  SOLN
100.0000 mL | Freq: Once | INTRAMUSCULAR | Status: AC | PRN
  Administered 2012-07-25: 100 mL via INTRAVENOUS

## 2012-07-25 MED ORDER — HEPARIN BOLUS VIA INFUSION
2500.0000 [IU] | Freq: Once | INTRAVENOUS | Status: DC
  Filled 2012-07-25: qty 2500

## 2012-07-25 MED ORDER — MAGNESIUM SULFATE 40 MG/ML IJ SOLN
2.0000 g | Freq: Once | INTRAMUSCULAR | Status: AC
  Administered 2012-07-25: 2 g via INTRAVENOUS
  Filled 2012-07-25: qty 50

## 2012-07-25 NOTE — Consult Note (Signed)
Cardiology Consult Note  Admit date: 07/24/2012 Name: Brandon Clay 78 y.o.  male DOB:  [birth year] MRN:  [id number]  Today's date:  07/25/2012  Referring Physician:    [facility]  Reason for Consultation:   Atrial fibrillation with rapid response  IMPRESSIONS: 1. Rapid atrial fibrillation 2. Coronary artery disease with recent bare-metal stent placement on 23/14 3. Significant diarrhea and colitis 4. Hypotension due to diltiazem intravenously 5. Type 2 diabetes mellitus 6. BPH 7. Hyperlipidemia with statin intolerance 8. History of squamous cell cancer with lobectomy upcoming  RECOMMENDATION: The patient currently has rapid atrial fibrillation that may have been due to malabsorption of his medications do to diarrhea. He had hypotension when on intravenous diltiazem. The duration of atrial fibrillation is uncertain but was not present when he left the hospital previously. Would suggest intravenous heparin since the duration of atrial fibrillation is unknown but would have a low tolerance for stopping he develops any GI bleeding due to the diarrhea. We will try some intravenous beta blockers to see if we can slow the rate with this his dosage and his digoxin as he had hypotension with diltiazem. Once he has had some fluid loading, may try intravenous diltiazem again. We will follow along with you.  HISTORY: This 78 year old male has a prior history of hypertension diabetes and hyperlipidemia. He was diagnosed with squamous cell lung cancer was due to have a lobectomy. As part of preoperative evaluation for this he had a nuclear perfusion scan that showed inferolateral ischemia and scar. Catheterization showed 3 vessel disease and he was felt to be too high risk for combined CABG/lobectomy. He was treated on 2/314 with a bare-metal stent to the circumflex by Dr. Sanjuana Kava. He was discharged on aspirin and brought in to.  He presented to the emergency room with several days of  diarrhea and was found to be in rapid atrial fibrillation. He has not had any dyspnea or anginal pains. He was started on treatment with intravenous diltiazem but developed hypotension and this was discontinued. He currently is lying in bed with no complaints of chest pain or shortness of breath but is in atrial fibrillation with a rate of around 125. He has also had significant peripheral edema.  Past Medical History  Diagnosis Date  . Hypertension   . Hypercholesterolemia     statin intolerant  . Diabetes mellitus, type 2   . Benign prostatic hypertrophy   . Emphysema   . Lung cancer     Followed by Dr. [name] (LUL squamous cell carcinoma)  . COPD (chronic obstructive pulmonary disease)   . CKD (chronic kidney disease)   . Colitis   . Coronary artery disease     Cath 07/02/12 3v CAD w/ chronic occlusion of RCA (fills from left collaterals), severe stenosis prox LCx & moderately severe dz in mid LAD; s/p PTCA/BMS to prox LCx 07/12/12   . Normocytic anemia       Past Surgical History  Procedure Laterality Date  . Hemorrhoid surgery       Allergies:  has No Known Allergies.   Medications: Prior to Admission medications   Medication Sig Start Date End Date Taking? Authorizing Provider  aspirin EC 81 MG tablet Take 81 mg by mouth every other day.   Yes Historical Provider, MD  budesonide-formoterol (SYMBICORT) 160-4.5 MCG/ACT inhaler Inhale 2 puffs into the lungs 2 (two) times daily.   Yes Historical Provider, MD  carvedilol (COREG) 25 MG tablet Take 25 mg by mouth 2 (  two) times daily with a meal.  06/13/12  Yes Historical Provider, MD  finasteride (PROSCAR) 5 MG tablet Take 5 mg by mouth every morning.  05/31/12  Yes Historical Provider, MD  metFORMIN (GLUCOPHAGE-XR) 500 MG 24 hr tablet Take 2 tablets (1,000 mg total) by mouth 2 (two) times daily. Please hold for 48hrs after heart catheterization. Resume on 07/15/12 07/13/12  Yes Jessica A Hope, PA-C  metroNIDAZOLE (FLAGYL) 500 MG tablet  Take 1 tablet (500 mg total) by mouth 3 (three) times daily. 07/04/12  Yes Benny Lennert, MD  Tamsulosin HCl (FLOMAX) 0.4 MG CAPS Take 0.4 mg by mouth every morning.  05/31/12  Yes Historical Provider, MD  Ticagrelor (BRILINTA) 90 MG TABS tablet Take 1 tablet (90 mg total) by mouth 2 (two) times daily. 07/13/12  Yes Jessica A Hope, PA-C    Family History: No family status information on file.    Social History:   reports that he quit smoking about 5 years ago. His smoking use included Cigarettes. He started smoking about 54 years ago. He has a 25 pack-year smoking history. He has never used smokeless tobacco. He reports that he does not drink alcohol or use illicit drugs.   History   Social History Narrative  . No narrative on file    Review of Systems: He has had significant urinary hesitancy as well as nocturia, he has had significant diarrhea without bloody diarrhea and has had multiple episodes of this. He also has some moderate leukocytosis. He denies PND orthopnea but has had significant edema. He has mild dyspnea with exertion.  Other than as noted above the remainder of the review of systems is unremarkable.  Physical Exam: BP 120/82  Pulse 107  Temp(Src) 98.3 F (36.8 C) (Oral)  Resp 19  Ht 5' 10.08" (1.78 m)  Wt 81.4 kg (179 lb 7.3 oz)  BMI 25.69 kg/m2  SpO2 99%  General appearance: alert, cooperative, appears stated age, no distress and mildly obese Neck: no adenopathy, no carotid bruit, no JVD and supple, symmetrical, trachea midline Lungs: clear to auscultation bilaterally Heart: Rapid regular rhythm, normal S1-S2, no S3 Abdomen: soft, non-tender; bowel sounds normal; no masses,  no organomegaly Rectal: deferred Extremities: 2+ edema noted Skin: Skin color, texture, turgor normal. No rashes or lesions Neurologic: Grossly normal  Labs: CBC  Recent Labs  07/24/12 1800 07/25/12 0308  WBC 16.5* 16.6*  RBC 3.78* 3.76*  HGB 10.6* 10.6*  HCT 32.1* 31.8*   PLT 151 167  MCV 84.9 84.6  MCH 28.0 28.2  MCHC 33.0 33.3  RDW 17.5* 17.7*  LYMPHSABS 1.1  --   MONOABS 1.2*  --   EOSABS 0.0  --   BASOSABS 0.0  --    CMP   Recent Labs  07/24/12 1800 07/25/12 0308  NA 135 132*  K 3.3* 3.4*  CL 101 97  CO2 19 21  GLUCOSE 187* 193*  BUN 16 16  CREATININE 1.04 1.15  CALCIUM 8.0* 8.1*  PROT 5.8*  --   ALBUMIN 2.4*  --   AST 9  --   ALT 10  --   ALKPHOS 46  --   BILITOT 0.4  --   GFRNONAA 68* 60*  GFRAA 79* 70*   BNP (last 3 results)  Recent Labs  07/24/12 2140  PROBNP 15551.0*   Cardiac Panel (last 3 results)  Recent Labs  07/24/12 1800 07/24/12 2140 07/25/12 0308  TROPONINI <0.30 <0.30 <0.30     Radiology: Left upper  lobe lung mass, no congestive heart failure, densities noted in bases.  EKG: Atrial fibrillation with rapid ventricular response.  Signed:  Darden Palmer MD [facility]   Cardiology Consultant  07/25/2012, 9:24 AM

## 2012-07-25 NOTE — Plan of Care (Signed)
Problem: Phase I Progression Outcomes Goal: Voiding-avoid urinary catheter unless indicated Outcome: Completed/Met Date Met:  07/25/12 Pt has a condom cath due to urinary incontinence

## 2012-07-25 NOTE — Progress Notes (Signed)
Stool for C. difficile positive. Ciprofloxacin discontinued and added oral vancomycin. Continue IV Flagyl given severity of illness. Patient hypotensive this morning and received almost 2 L normal saline bolus. He is again hypotensive with systolic BP at 79 and given another with normal saline bolus. He is lethargic as well. Heart rate between 92-100 at present. Discussed with [facility] and Dr. Cathie Hoops recommended continuing IV bolus and start Levophed from peripheral line if needed. As per nurse patient has not had urine output since this morning. Order for Foley catheter placement. Patient started on IV heparin by cardiology for his A. fib. Of note his recent stress test showed an EF of 45%.

## 2012-07-25 NOTE — Progress Notes (Addendum)
[facility] PROGRESS NOTE  Brandon Clay ZOX:[id number] DOB: 1934-11-03 DOA: 07/24/2012 PCP: [name], MD   Brief narrative 78 year old male with extensive cardiac history with three-vessel CAD status post recent cardiac cath, left upper lobe, some cancer with planned lobectomy in 1 month presenting withwatery diarrhea almost 2 weeks of diarrhea with crampy abdominal pain with fever and leukocytosis secondary to sepsis with C. difficile colitis  Assessment/Plan: Sepsis secondary to C. difficile colitis Patient presented with fever and significant leukocytosis and a fever with RVR. Overnight he was hypotensive as well requiring fluid bolus. Stool for C. difficile positive. CT scan of the abdomen and pelvis done on admission showing diffuse colitis. -H. and started on IV ciprofloxacin and Flagyl. Added oral vancomycin this morning and will discontinue ciprofloxacin and Flagyl. -Patient again hypotensive this morning and order one more unit of IV normal saline bolus. Continue with IV hydration. -Continue to step down monitoring. Replenish low potassium and magnesium.  A. fib with RVR. Likely triggered by sepsis. Patient placed on Cardizem drip on admission however became hypotensive. Started on IV heparin by cardiology. discontinued Cardizem drip and placed on IV Lopressor as needed and as blood pressure tolerated. 2-D echo ordered Appreciate cardiology recommendations.  Hypotension Hold blood pressure medications. When necessary Lopressor if tolerated for rapid A. fib  COPD The O2 via nasal cannula. Currently stable. Continue Symbicort  CAD Has three-vessel disease on recent cardiac cath and a high risk for CABG. Had a BMS placed in on recent cardiac cath. Being treated with aspirin and Ticagrelor for dual antiplatelet therapy. intolerant to statins.  LUL, cell cancer Plan for lobectomy in 1 month  Anemia of chronic disease Hemoglobin stable at this  time  Diabetes mellitus Holding metformin. Continue sliding scale insulin. Follow A1c  BPH Continue finasteride and Flomax   Code Status: Full code Family Communication: Wife at bedside  Disposition Plan: Continue step down on monitoring   Consultants:  [place] [facility]  Procedures:  None  Antibiotics:  Ciprofloxacin and Flagyl (2/15)  Oral vancomycin (2/16)  HPI/Subjective: Patient seen and examined this morning she had 3 episodes of loose bowel movement overnight. Denies any abdominal pain at this time. Denies nausea or vomiting.  Objective: Filed Vitals:   07/25/12 0630 07/25/12 0645 07/25/12 0800 07/25/12 1000  BP: 101/69 111/81 120/82 65/46  Pulse:  87 107 100  Temp:   98.3 F (36.8 C)   TempSrc:   Oral   Resp: 20 21 19 19   Height:      Weight:      SpO2:  89% 99% 97%    Intake/Output Summary (Last 24 hours) at 07/25/12 1047 Last data filed at 07/25/12 1034  Gross per 24 hour  Intake   2807 ml  Output      0 ml  Net   2807 ml   Filed Weights   07/24/12 2125  Weight: 81.4 kg (179 lb 7.3 oz)    Exam:   General:  Elderly male lying in bed in no acute distress  HEENT: No pallor, moist oral mucosa  Cardiovascular: S1 and S2 irregular, no murmurs rub or gallop  Respiratory: Clear to auscultation bilaterally, no added sounds  Abdomen: Soft, nontender, nondistended, bowel sounds present  Extremities: Warm, no edema  CNS: AAO x3  Data Reviewed: Basic Metabolic Panel:  Recent Labs Lab 07/24/12 1800 07/24/12 2140 07/25/12 0308  NA 135  --  132*  K 3.3*  --  3.4*  CL 101  --  97  CO2 19  --  21  GLUCOSE 187*  --  193*  BUN 16  --  16  CREATININE 1.04  --  1.15  CALCIUM 8.0*  --  8.1*  MG  --  1.2*  --   PHOS  --  2.8  --    Liver Function Tests:  Recent Labs Lab 07/24/12 1800  AST 9  ALT 10  ALKPHOS 46  BILITOT 0.4  PROT 5.8*  ALBUMIN 2.4*   No results found for this basename: LIPASE, AMYLASE,  in the last 168  hours No results found for this basename: AMMONIA,  in the last 168 hours CBC:  Recent Labs Lab 07/24/12 1800 07/25/12 0308  WBC 16.5* 16.6*  NEUTROABS 14.1*  --   HGB 10.6* 10.6*  HCT 32.1* 31.8*  MCV 84.9 84.6  PLT 151 167   Cardiac Enzymes:  Recent Labs Lab 07/24/12 1800 07/24/12 2140 07/25/12 0308 07/25/12 0930  TROPONINI <0.30 <0.30 <0.30 <0.30   BNP (last 3 results)  Recent Labs  07/24/12 2140  PROBNP 15551.0*   CBG:  Recent Labs Lab 07/24/12 2313  GLUCAP 203*    Recent Results (from the past 240 hour(s))  MRSA PCR SCREENING     Status: None   Collection Time    07/25/12  2:10 AM      Result Value Range Status   MRSA by PCR NEGATIVE  NEGATIVE Final   Comment:            The GeneXpert MRSA Assay (FDA     approved for NASAL specimens     only), is one component of a     comprehensive MRSA colonization     surveillance program. It is not     intended to diagnose MRSA     infection nor to guide or     monitor treatment for     MRSA infections.     Studies: Dg Chest 2 View  07/24/2012  *RADIOLOGY REPORT*  Clinical Data: Atrial fibrillation.  Fever.  CHEST - 2 VIEW  Comparison: 07/04/2012 next few  Findings: There is mild cardiac enlargement.  Again noted is the left upper lobe lung mass measuring approximately 4.2 cm.  Advanced interstitial lung disease is identified bilaterally.  Unchanged mid and lower lung pulmonary opacities which could be related to chronic lung disease or multifocal infection.  IMPRESSION:  1.  Unchanged left upper lobe lung mass. 2.  Similar appearance of bilateral mid and lower lung zone opacities   Original Report Authenticated By: Signa Kell, M.D.    Ct Abdomen Pelvis W Contrast  07/25/2012  *RADIOLOGY REPORT*  Clinical Data: Lower abdominal pain, diarrhea, fever and leukocytosis.  CT ABDOMEN AND PELVIS WITH CONTRAST  Technique:  Multidetector CT imaging of the abdomen and pelvis was performed following the standard  protocol during bolus administration of intravenous contrast.  Contrast: OMNIPAQUE IOHEXOL 300 MG/ML  SOLN  Comparison: Images from the PET / CT performed 06/22/2012, and abdominal ultrasound performed 08/01/2010  Findings: Small right and trace left pleural effusions are noted, with underlying bibasilar airspace opacities.  There is suggestion of mild associated honeycombing.  A 7 mm nodule is noted at the right middle lobe (image 3 of 26); a larger 9 mm nodule is suggested at the right lung base (image 8 of 26).  These are stable from 2012 and likely benign.  Diffuse coronary artery calcifications are seen.  There is a single small calcified granuloma near the  inferior tip of the liver.  The liver and spleen are otherwise unremarkable in appearance.  Prominent stones are noted layering dependently within the gallbladder; the gallbladder is otherwise unremarkable.  The pancreas and adrenal glands are within normal limits; mild focal fatty infiltration within the pancreatic head is nonspecific in appearance.  Nonspecific perinephric stranding is noted bilaterally.  A few vague areas of decreased parenchymal attenuation at the interpole region of the left kidney appear stable from 2012 and may reflect scarring.  Tiny scattered bilateral renal cysts are seen.  There is no evidence of hydronephrosis.  No renal or ureteral stones are seen.  The small bowel is unremarkable in appearance.  The stomach is within normal limits.  No acute vascular abnormalities are seen. Scattered calcification is noted along the abdominal aorta and its branches, including at the origins of both renal arteries.  Mural thrombus is noted along the distal abdominal aorta, with mild luminal narrowing.  The appendix is not definitely seen; there is no evidence of appendicitis.  There is relatively diffuse wall thickening along the entirety of the colon, with mild sparing along the proximal descending colon. This is compatible with diffuse  colitis.  Surrounding soft tissue inflammation is seen, most prominent at the sigmoid colon. Underlying diverticula are noted along the sigmoid colon.  There is no evidence of perforation or abscess formation.  Mild associated presacral stranding and trace free fluid are seen.  The bladder is mildly distended and grossly unremarkable in appearance.  The prostate remains normal in size, with minimal calcification.  No inguinal lymphadenopathy is seen.  No acute osseous abnormalities are identified.  A sclerotic focus at the right acetabulum is stable from 2012 and may reflect a large bone island.  IMPRESSION:  1.  Diffuse colitis noted involving the entirety of the colon, most prominent along the sigmoid colon, with surrounding soft tissue inflammation and trace associated free fluid.  No evidence of perforation or abscess formation. 2.  Small right and trace left pleural effusions, with underlying bibasilar airspace opacification.  Suggestion of mild associated honeycombing.  Small nodules at the right lung base are stable from 2012 and likely benign. 3.  Scattered calcification along the abdominal aorta and its branches, including at the origins of both renal arteries.  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 4.  Diffuse coronary artery calcifications seen. 5.  Cholelithiasis; gallbladder otherwise unremarkable in appearance. 6.  Left renal scarring and tiny bilateral renal cysts.   Original Report Authenticated By: Tonia Ghent, M.D.     Scheduled Meds: . aspirin EC  81 mg Oral QODAY  . budesonide-formoterol  2 puff Inhalation BID  . carvedilol  25 mg Oral BID WC  . ciprofloxacin  200 mg Intravenous Q12H  . digoxin  0.25 mg Intravenous Q6H  . enoxaparin (LOVENOX) injection  40 mg Subcutaneous Q24H  . finasteride  5 mg Oral q morning - 10a  . insulin aspart  0-9 Units Subcutaneous TID WC  . magnesium sulfate 1 - 4 g bolus IVPB  2 g Intravenous Once  .  metronidazole  500 mg Intravenous Q8H  . sodium chloride  1,000 mL Intravenous Once  . sodium chloride  3 mL Intravenous Q12H  . Tamsulosin HCl  0.4 mg Oral q morning - 10a  . Ticagrelor  90 mg Oral BID  . vancomycin  125 mg Oral Q6H   Continuous Infusions: . sodium chloride 75 mL/hr at 07/25/12 1034  . diltiazem (  CARDIZEM) infusion Stopped (07/25/12 0439)     Time spent:35 minutes    Luzelena Heeg  [facility] Pager [phone] If 8PM-8AM, please contact night-coverage at [email or web address], password [email or web address] 07/25/2012, 10:47 AM  LOS: 1 day

## 2012-07-25 NOTE — Progress Notes (Signed)
ANTICOAGULATION CONSULT NOTE - Follow Up Consult  Pharmacy Consult for IV heparin Indication: atrial fibrillation  No Known Allergies  Patient Measurements: Height: 5' 10.08" (178 cm) Weight: 179 lb 7.3 oz (81.4 kg) IBW/kg (Calculated) : 73.18 Heparin Dosing Weight: 81.4kg  Vital Signs: Temp: 99.6 F (37.6 C) (02/16 1600) Temp src: Oral (02/16 1600) BP: 87/49 mmHg (02/16 1800) Pulse Rate: 97 (02/16 1800)  Labs:  Recent Labs  07/24/12 1800 07/24/12 2140 07/25/12 0308 07/25/12 0930 07/25/12 1014 07/25/12 1753  HGB 10.6*  --  10.6*  --   --   --   HCT 32.1*  --  31.8*  --   --   --   PLT 151  --  167  --   --   --   APTT  --   --   --   --  34  --   LABPROT  --   --   --   --  14.3  --   INR  --   --   --   --  1.13  --   HEPARINUNFRC  --   --   --   --   --  <0.10*  CREATININE 1.04  --  1.15  --   --   --   TROPONINI <0.30 <0.30 <0.30 <0.30  --   --     Estimated Creatinine Clearance: 57.5 ml/min (by C-G formula based on Cr of 1.15).   Assessment: 9 yom with complex cardiac h/o (including afib) and recent dx lung cancer presented 2/15 with afib with RBR. IV heparin ordered.   Patient received IV heparin 4000 units/hr and infusion at 1150 units/hr and first heparin level returns undetectable (<0.1).  Contacted RN and confirmed IV heparin was running at 1000 units/hr (not 1150 units/hr as ordered)  Goal of Therapy:  Heparin level 0.3-0.7 units/ml Monitor platelets by anticoagulation protocol: Yes   Plan:   Re-bolus with 2500 units x1 then increase IV heparin to 1300 units/hr.   Recheck Heparin level at with AM labs   Daily CBC and heparin level   Geoffry Paradise, PharmD, BCPS Pager: [phone] 6:29 PM Pharmacy #: [id number]

## 2012-07-25 NOTE — Progress Notes (Signed)
ANTICOAGULATION CONSULT NOTE - Initial Consult  Pharmacy Consult for Heparin Indication: atrial fibrillation  No Known Allergies  Patient Measurements: Height: 5' 10.08" (178 cm) Weight: 179 lb 7.3 oz (81.4 kg) IBW/kg (Calculated) : 73.18 Heparin Dosing Weight: 81.4kg  Vital Signs: Temp: 98.3 F (36.8 C) (02/16 0800) Temp src: Oral (02/16 0800) BP: 65/46 mmHg (02/16 1000) Pulse Rate: 100 (02/16 1000)  Labs:  Recent Labs  07/24/12 1800 07/24/12 2140 07/25/12 0308 07/25/12 0930 07/25/12 1014  HGB 10.6*  --  10.6*  --   --   HCT 32.1*  --  31.8*  --   --   PLT 151  --  167  --   --   APTT  --   --   --   --  34  LABPROT  --   --   --   --  14.3  INR  --   --   --   --  1.13  CREATININE 1.04  --  1.15  --   --   TROPONINI <0.30 <0.30 <0.30 <0.30  --     Estimated Creatinine Clearance: 57.5 ml/min (by C-G formula based on Cr of 1.15).   Medical History: Past Medical History  Diagnosis Date  . Hypertension   . Hypercholesterolemia     statin intolerant  . Diabetes mellitus, type 2   . Benign prostatic hypertrophy   . Emphysema   . Lung cancer     Followed by Dr. [name] (LUL squamous cell carcinoma)  . COPD (chronic obstructive pulmonary disease)   . CKD (chronic kidney disease)   . Colitis   . Coronary artery disease     Cath 07/02/12 3v CAD w/ chronic occlusion of RCA (fills from left collaterals), severe stenosis prox LCx & moderately severe dz in mid LAD; s/p PTCA/BMS to prox LCx 07/12/12   . Normocytic anemia     Medications:  Scheduled:  . aspirin EC  81 mg Oral QODAY  . budesonide-formoterol  2 puff Inhalation BID  . carvedilol  25 mg Oral BID WC  . ciprofloxacin  200 mg Intravenous Q12H  . digoxin  0.25 mg Intravenous Q6H  . [COMPLETED] diltiazem  5 mg Intravenous Once  . enoxaparin (LOVENOX) injection  40 mg Subcutaneous Q24H  . finasteride  5 mg Oral q morning - 10a  . insulin aspart  0-9 Units Subcutaneous TID WC  . magnesium sulfate 1 - 4 g  bolus IVPB  2 g Intravenous Once  . metronidazole  500 mg Intravenous Q8H  . [COMPLETED] ondansetron (ZOFRAN) IV  4 mg Intravenous Once  . [COMPLETED] potassium chloride  20 mEq Oral Once  . sodium chloride  1,000 mL Intravenous Once  . [COMPLETED] sodium chloride  250 mL Intravenous Once  . [COMPLETED] sodium chloride  500 mL Intravenous Once  . sodium chloride  3 mL Intravenous Q12H  . Tamsulosin HCl  0.4 mg Oral q morning - 10a  . Ticagrelor  90 mg Oral BID  . vancomycin  125 mg Oral Q6H  . [DISCONTINUED] metronidazole  500 mg Intravenous Q8H  . [DISCONTINUED] ondansetron  4 mg Intravenous Once  . [DISCONTINUED] potassium chloride  20 mEq Oral Once  . [DISCONTINUED] sodium chloride  1,000 mL Intravenous Once  . [DISCONTINUED] sodium chloride  1,000 mL Intravenous Once   Infusions:  . sodium chloride 75 mL/hr at 07/25/12 1034  . diltiazem (CARDIZEM) infusion Stopped (07/25/12 0439)  . [DISCONTINUED] sodium chloride 75 mL/hr (07/24/12 2354)  PRN: HYDROcodone-acetaminophen, [COMPLETED] iohexol, [COMPLETED] iohexol, metoprolol, ondansetron (ZOFRAN) IV, ondansetron  Assessment: 78 yo M with atrial fibrillation w/ rapid response Goal of Therapy:  Heparin level 0.3-0.7 units/ml Monitor platelets by anticoagulation protocol: Yes   Plan:  Give 4000 units bolus x 1 Start heparin infusion at 1150 units/hr Continue to monitor H&H and platelets  Loletta Specter 07/25/2012,10:47 AM

## 2012-07-25 NOTE — Progress Notes (Signed)
Pt transferred to step down ICU.  Roosvelt Maser, RN

## 2012-07-25 NOTE — Progress Notes (Signed)
  Echocardiogram 2D Echocardiogram has been performed.  Brazen Domangue 07/25/2012, 1:33 PM

## 2012-07-25 NOTE — Progress Notes (Signed)
UR completed 

## 2012-07-25 NOTE — Progress Notes (Signed)
CRITICAL VALUE ALERT  Critical value received: positive C diff  Date of notification:  07/25/2012  Time of notification: 11:25  Critical value read back:yes  Nurse who received alert:  Lorrin Jackson RN  MD notified (1st page): Dr. Gonzella Lex  Time of first page:  11:26  MD notified (2nd page): Dr Gonzella Lex  Time of second page:1135  Responding MD: Dr. Gonzella Lex  Time MD responded:  1140

## 2012-07-26 DIAGNOSIS — J449 Chronic obstructive pulmonary disease, unspecified: Secondary | ICD-10-CM

## 2012-07-26 LAB — BASIC METABOLIC PANEL WITH GFR
BUN: 14 mg/dL (ref 6–23)
CO2: 17 meq/L — ABNORMAL LOW (ref 19–32)
Calcium: 7.2 mg/dL — ABNORMAL LOW (ref 8.4–10.5)
Chloride: 105 meq/L (ref 96–112)
Creatinine, Ser: 1.08 mg/dL (ref 0.50–1.35)
GFR calc Af Amer: 76 mL/min — ABNORMAL LOW
GFR calc non Af Amer: 65 mL/min — ABNORMAL LOW
Glucose, Bld: 161 mg/dL — ABNORMAL HIGH (ref 70–99)
Potassium: 3.1 meq/L — ABNORMAL LOW (ref 3.5–5.1)
Sodium: 132 meq/L — ABNORMAL LOW (ref 135–145)

## 2012-07-26 LAB — CBC
MCHC: 33.1 g/dL (ref 30.0–36.0)
Platelets: 134 10*3/uL — ABNORMAL LOW (ref 150–400)
RDW: 17.8 % — ABNORMAL HIGH (ref 11.5–15.5)
WBC: 12 10*3/uL — ABNORMAL HIGH (ref 4.0–10.5)

## 2012-07-26 LAB — URINE CULTURE

## 2012-07-26 LAB — MAGNESIUM: Magnesium: 1.5 mg/dL (ref 1.5–2.5)

## 2012-07-26 LAB — HEPARIN LEVEL (UNFRACTIONATED)
Heparin Unfractionated: <0.1 [IU]/mL — ABNORMAL LOW (ref 0.30–0.70)
Heparin Unfractionated: 0.1 IU/mL — ABNORMAL LOW (ref 0.30–0.70)

## 2012-07-26 LAB — GLUCOSE, CAPILLARY: Glucose-Capillary: 218 mg/dL — ABNORMAL HIGH (ref 70–99)

## 2012-07-26 MED ORDER — HEPARIN BOLUS VIA INFUSION
2000.0000 [IU] | Freq: Once | INTRAVENOUS | Status: AC
  Administered 2012-07-26: 2000 [IU] via INTRAVENOUS
  Filled 2012-07-26: qty 2000

## 2012-07-26 MED ORDER — MAGNESIUM SULFATE 40 MG/ML IJ SOLN
2.0000 g | Freq: Once | INTRAMUSCULAR | Status: AC
  Administered 2012-07-26: 2 g via INTRAVENOUS
  Filled 2012-07-26: qty 50

## 2012-07-26 MED ORDER — HEPARIN (PORCINE) IN NACL 100-0.45 UNIT/ML-% IJ SOLN
2350.0000 [IU]/h | INTRAMUSCULAR | Status: DC
  Administered 2012-07-26 (×2): 1800 [IU]/h via INTRAVENOUS
  Administered 2012-07-27 – 2012-07-29 (×6): 2100 [IU]/h via INTRAVENOUS
  Administered 2012-07-29 – 2012-07-30 (×3): 2350 [IU]/h via INTRAVENOUS
  Filled 2012-07-26 (×16): qty 250

## 2012-07-26 MED ORDER — METOPROLOL TARTRATE 12.5 MG HALF TABLET
12.5000 mg | ORAL_TABLET | Freq: Two times a day (BID) | ORAL | Status: DC
  Administered 2012-07-26 (×2): 12.5 mg via ORAL
  Filled 2012-07-26 (×4): qty 1

## 2012-07-26 MED ORDER — POTASSIUM CHLORIDE CRYS ER 20 MEQ PO TBCR
40.0000 meq | EXTENDED_RELEASE_TABLET | Freq: Once | ORAL | Status: AC
  Administered 2012-07-26: 40 meq via ORAL

## 2012-07-26 MED ORDER — POTASSIUM CHLORIDE CRYS ER 20 MEQ PO TBCR
40.0000 meq | EXTENDED_RELEASE_TABLET | Freq: Once | ORAL | Status: DC
  Filled 2012-07-26: qty 2

## 2012-07-26 NOTE — Progress Notes (Signed)
Hypokalemia and hypomagnesemia   K and Mg repalced

## 2012-07-26 NOTE — Progress Notes (Signed)
HYpokalemia   K replaced

## 2012-07-26 NOTE — Progress Notes (Signed)
[facility] PROGRESS NOTE  Brandon REDDY WUJ:[id number] DOB: March 21, 1935 DOA: 07/24/2012 PCP: [name], MD  rief narrative  78 year old male with extensive cardiac history with three-vessel CAD status post recent cardiac cath, left upper lobe, some cancer with planned lobectomy in 1 month presenting withwatery diarrhea almost 2 weeks of diarrhea with crampy abdominal pain with fever and leukocytosis secondary to sepsis with C. difficile colitis.  Assessment/Plan:  Sepsis secondary to C. difficile colitis  Patient presented with fever and significant leukocytosis and A. fib with RVR. Marland Kitchen Stool for C. difficile positive. CT scan of the abdomen and pelvis done on admission showing diffuse colitis.  -Patient hypotensive on 2/16 requiring almost 4 L of IV normal saline boluses followed by continues normal saline fusion. Blood pressure now stable. Patient initially placed on IV ciprofloxacin and Flagyl given diffuse C. difficile colitis switch to oral vancomycin and Flagyl continued. leukocytosis improving. Has a low-grade temperature today.  -Heart rate controlled between 90-110s.  -Holding blood pressure medications.   A. fib with RVR.  Likely triggered by sepsis. Patient placed on Cardizem drip on admission however became hypotensive. Started on IV heparin by cardiology. discontinued Cardizem drip and placed on IV Lopressor as needed and as blood pressure tolerated.  -Also started on digoxin 2-D echo shows improved EF of 60-65%. On recent Myoview, EF was 45% Appreciate cardiology recommendations.   Hypotension  Blood pressure now stable and improved with IV fluids. Will resume Coreg at low dose if continues to be stable during the day -Fluid positive by almost 8 L since admission. Has had good urine output after Foley placed in. Monitor I/O. closely  COPD  - O2 via nasal cannula. Currently stable. Continue Symbicort   CAD  Has extensive three-vessel disease on recent  cardiac cath and not a good candidate for CABG. Had a BMS placed in on recent cardiac cath. Being treated with aspirin and Ticagrelor for dual antiplatelet therapy.  intolerant to statins.   Hypokalemia/hypomagnesemia Replenished  LUL, squamous cell cancer  Plan for lobectomy in 1 month   Anemia of chronic disease  Hemoglobin stable at this time   Diabetes mellitus  Holding metformin. Continue sliding scale insulin. Follow A1c   BPH  Continue finasteride and Flomax   Code Status: Full code  Family Communication: Wife at bedside  Disposition Plan: Continue step down on monitoring   Consultants:  [facility] Procedures:  None Antibiotics:  Ciprofloxacin and Flagyl (2/15)  Oral vancomycin (2/16)   Code Status: Full code Family Communication: None at bedside. Wife and daughter involved in care Disposition Plan: Home once stable   Consultants:  PC CM  [facility]  Procedures:  None  Antibiotics:  Oral vancomycin (2/16)  IV Flagyl (2/15)  HPI/Subjective: Patient seen and examined this morning. Blood pressure has been much stable. Denies any specific complaints. Has had 3 loose bowel movement since yesterday.  Objective: Filed Vitals:   07/26/12 0300 07/26/12 0400 07/26/12 0500 07/26/12 0600  BP: 132/72 144/114 132/72 128/81  Pulse:  100 106 97  Temp:  99.8 F (37.7 C)    TempSrc:  Oral    Resp: 20 21 16 19   Height:      Weight:  88.8 kg (195 lb 12.3 oz)    SpO2:  96% 96% 97%    Intake/Output Summary (Last 24 hours) at 07/26/12 0824 Last data filed at 07/26/12 0700  Gross per 24 hour  Intake 7562.5 ml  Output   1141 ml  Net  6421.5 ml   Filed Weights   07/24/12 2125 07/26/12 0400  Weight: 81.4 kg (179 lb 7.3 oz) 88.8 kg (195 lb 12.3 oz)    Exam:   General: Elderly male lying in bed in no acute distress  HEENT: No pallor, moist oral mucosa  Cardiovascular: S1 And S2 irregular, no murmurs rub or gallop  Respiratory: Clear  To auscultation bilaterally no added sounds  Abdomen: Soft, nontender, nondistended, bowel sounds present  Extremities: Warm, no edema  CNS: AAO x3  Data Reviewed: Basic Metabolic Panel:  Recent Labs Lab 07/24/12 1800 07/24/12 2140 07/25/12 0308 07/26/12 0330  NA 135  --  132* 132*  K 3.3*  --  3.4* 3.1*  CL 101  --  97 105  CO2 19  --  21 17*  GLUCOSE 187*  --  193* 161*  BUN 16  --  16 14  CREATININE 1.04  --  1.15 1.08  CALCIUM 8.0*  --  8.1* 7.2*  MG  --  1.2*  --  1.5  PHOS  --  2.8  --   --    Liver Function Tests:  Recent Labs Lab 07/24/12 1800  AST 9  ALT 10  ALKPHOS 46  BILITOT 0.4  PROT 5.8*  ALBUMIN 2.4*   No results found for this basename: LIPASE, AMYLASE,  in the last 168 hours No results found for this basename: AMMONIA,  in the last 168 hours CBC:  Recent Labs Lab 07/24/12 1800 07/25/12 0308 07/26/12 0330  WBC 16.5* 16.6* 12.0*  NEUTROABS 14.1*  --   --   HGB 10.6* 10.6* 9.7*  HCT 32.1* 31.8* 29.3*  MCV 84.9 84.6 84.7  PLT 151 167 134*   Cardiac Enzymes:  Recent Labs Lab 07/24/12 1800 07/24/12 2140 07/25/12 0308 07/25/12 0930  TROPONINI <0.30 <0.30 <0.30 <0.30   BNP (last 3 results)  Recent Labs  07/24/12 2140  PROBNP 15551.0*   CBG:  Recent Labs Lab 07/24/12 2313 07/25/12 0735 07/25/12 1129 07/25/12 1602 07/26/12 0745  GLUCAP 203* 185* 250* 116* 218*    Recent Results (from the past 240 hour(s))  CLOSTRIDIUM DIFFICILE BY PCR     Status: Abnormal   Collection Time    07/24/12 11:53 PM      Result Value Range Status   C difficile by pcr POSITIVE (*) NEGATIVE Final   Comment: CRITICAL RESULT CALLED TO, READ BACK BY AND VERIFIED WITH:     ARNOLD,A RN 07/25/12 1124 WOOTEN,K  MRSA PCR SCREENING     Status: None   Collection Time    07/25/12  2:10 AM      Result Value Range Status   MRSA by PCR NEGATIVE  NEGATIVE Final   Comment:            The GeneXpert MRSA Assay (FDA     approved for NASAL specimens      only), is one component of a     comprehensive MRSA colonization     surveillance program. It is not     intended to diagnose MRSA     infection nor to guide or     monitor treatment for     MRSA infections.     Studies: Dg Chest 2 View  07/24/2012  *RADIOLOGY REPORT*  Clinical Data: Atrial fibrillation.  Fever.  CHEST - 2 VIEW  Comparison: 07/04/2012 next few  Findings: There is mild cardiac enlargement.  Again noted is the left upper lobe lung mass measuring approximately  4.2 cm.  Advanced interstitial lung disease is identified bilaterally.  Unchanged mid and lower lung pulmonary opacities which could be related to chronic lung disease or multifocal infection.  IMPRESSION:  1.  Unchanged left upper lobe lung mass. 2.  Similar appearance of bilateral mid and lower lung zone opacities   Original Report Authenticated By: Signa Kell, M.D.    Ct Abdomen Pelvis W Contrast  07/25/2012  *RADIOLOGY REPORT*  Clinical Data: Lower abdominal pain, diarrhea, fever and leukocytosis.  CT ABDOMEN AND PELVIS WITH CONTRAST  Technique:  Multidetector CT imaging of the abdomen and pelvis was performed following the standard protocol during bolus administration of intravenous contrast.  Contrast: OMNIPAQUE IOHEXOL 300 MG/ML  SOLN  Comparison: Images from the PET / CT performed 06/22/2012, and abdominal ultrasound performed 08/01/2010  Findings: Small right and trace left pleural effusions are noted, with underlying bibasilar airspace opacities.  There is suggestion of mild associated honeycombing.  A 7 mm nodule is noted at the right middle lobe (image 3 of 26); a larger 9 mm nodule is suggested at the right lung base (image 8 of 26).  These are stable from 2012 and likely benign.  Diffuse coronary artery calcifications are seen.  There is a single small calcified granuloma near the inferior tip of the liver.  The liver and spleen are otherwise unremarkable in appearance.  Prominent stones are noted layering  dependently within the gallbladder; the gallbladder is otherwise unremarkable.  The pancreas and adrenal glands are within normal limits; mild focal fatty infiltration within the pancreatic head is nonspecific in appearance.  Nonspecific perinephric stranding is noted bilaterally.  A few vague areas of decreased parenchymal attenuation at the interpole region of the left kidney appear stable from 2012 and may reflect scarring.  Tiny scattered bilateral renal cysts are seen.  There is no evidence of hydronephrosis.  No renal or ureteral stones are seen.  The small bowel is unremarkable in appearance.  The stomach is within normal limits.  No acute vascular abnormalities are seen. Scattered calcification is noted along the abdominal aorta and its branches, including at the origins of both renal arteries.  Mural thrombus is noted along the distal abdominal aorta, with mild luminal narrowing.  The appendix is not definitely seen; there is no evidence of appendicitis.  There is relatively diffuse wall thickening along the entirety of the colon, with mild sparing along the proximal descending colon. This is compatible with diffuse colitis.  Surrounding soft tissue inflammation is seen, most prominent at the sigmoid colon. Underlying diverticula are noted along the sigmoid colon.  There is no evidence of perforation or abscess formation.  Mild associated presacral stranding and trace free fluid are seen.  The bladder is mildly distended and grossly unremarkable in appearance.  The prostate remains normal in size, with minimal calcification.  No inguinal lymphadenopathy is seen.  No acute osseous abnormalities are identified.  A sclerotic focus at the right acetabulum is stable from 2012 and may reflect a large bone island.  IMPRESSION:  1.  Diffuse colitis noted involving the entirety of the colon, most prominent along the sigmoid colon, with surrounding soft tissue inflammation and trace associated free fluid.  No  evidence of perforation or abscess formation. 2.  Small right and trace left pleural effusions, with underlying bibasilar airspace opacification.  Suggestion of mild associated honeycombing.  Small nodules at the right lung base are stable from 2012 and likely benign. 3.  Scattered calcification along the abdominal aorta  and its branches, including at the origins of both renal arteries.  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 4.  Diffuse coronary artery calcifications seen. 5.  Cholelithiasis; gallbladder otherwise unremarkable in appearance. 6.  Left renal scarring and tiny bilateral renal cysts.   Original Report Authenticated By: Tonia Ghent, M.D.     Scheduled Meds: . aspirin EC  81 mg Oral QODAY  . budesonide-formoterol  2 puff Inhalation BID  . finasteride  5 mg Oral q morning - 10a  . heparin  2,500 Units Intravenous Once  . insulin aspart  0-9 Units Subcutaneous TID WC  . metronidazole  500 mg Intravenous Q8H  . sodium chloride  3 mL Intravenous Q12H  . Tamsulosin HCl  0.4 mg Oral q morning - 10a  . Ticagrelor  90 mg Oral BID  . vancomycin  125 mg Oral Q6H   Continuous Infusions: . sodium chloride 150 mL (07/26/12 0135)  . heparin 1,550 Units/hr (07/26/12 0500)      Time spent: 35 minutes    Amber Guthridge  [facility] Pager [phone]. If 8PM-8AM, please contact night-coverage at [email or web address], password [facility] 07/26/2012, 8:24 AM  LOS: 2 days

## 2012-07-26 NOTE — Consult Note (Signed)
Name: Brandon Clay MRN: [id number] DOB: 1935/05/05    LOS: 2  PULMONARY / CRITICAL CARE MEDICINE Brief 78 years old male with PMH relevant for DM2, HTN, COPD, CKD, CAD, LUL mass with planned surgical resection in the near future. Presents with 2 days history of severe diarrhea. Tested C. Diff positive. Since admission with borderline hypotension, A fib with RVR and lethargy. CC consult called to assist with management. At the time of my exam the patient is awake, alert, oriented X 3, with BP 130/75, sat 98%, HR: 90's. The patient has received 4 L of IVF's.  The patient denies abdominal pain or nausea. Does not have CP, SOB. Has mild abdominal distention. Last episode of diarrhea was 3 hours ago.   Microbiology Results for orders placed during the hospital encounter of 07/24/12  CLOSTRIDIUM DIFFICILE BY PCR     Status: Abnormal   Collection Time    07/24/12 11:53 PM      Result Value Range Status   C difficile by pcr POSITIVE (*) NEGATIVE Final   Comment: CRITICAL RESULT CALLED TO, READ BACK BY AND VERIFIED WITH:     ARNOLD,A RN 07/25/12 1124 WOOTEN,K  MRSA PCR SCREENING     Status: None   Collection Time    07/25/12  2:10 AM      Result Value Range Status   MRSA by PCR NEGATIVE  NEGATIVE Final   Comment:            The GeneXpert MRSA Assay (FDA     approved for NASAL specimens     only), is one component of a     comprehensive MRSA colonization     surveillance program. It is not     intended to diagnose MRSA     infection nor to guide or     monitor treatment for     MRSA infections.       Antibiotics Anti-infectives   Start     Dose/Rate Route Frequency Ordered Stop   07/25/12 1430  metroNIDAZOLE (FLAGYL) IVPB 500 mg     500 mg 100 mL/hr over 60 Minutes Intravenous Every 8 hours 07/25/12 1358     07/25/12 0830  vancomycin (VANCOCIN) 50 mg/mL oral solution 125 mg     125 mg Oral 4 times per day 07/25/12 0812     07/25/12 0000  metroNIDAZOLE (FLAGYL) IVPB 500 mg   Status:  Discontinued     500 mg 100 mL/hr over 60 Minutes Intravenous Every 8 hours 07/24/12 2350 07/25/12 1144   07/24/12 2200  ciprofloxacin (CIPRO) IVPB 200 mg  Status:  Discontinued     200 mg 100 mL/hr over 60 Minutes Intravenous Every 12 hours 07/24/12 2148 07/25/12 1144   07/24/12 2200  metroNIDAZOLE (FLAGYL) IVPB 500 mg  Status:  Discontinued     500 mg 100 mL/hr over 60 Minutes Intravenous Every 8 hours 07/24/12 2148 07/24/12 2349        EVENTS 07/24/2012 > admit    SUBJECTIVE/OVERNIGHT/INTERVAL HX   07/26/12: no diarrhea this am. Overnight only 2 x bm. Diarrhea slowing down. Not on pressors. Noted he has copd and sees someone in [place]; cannot identify who on records  Vital Signs: Temp:  [98.4 F (36.9 C)-99.9 F (37.7 C)] 98.4 F (36.9 C) (02/17 0800) Pulse Rate:  [84-106] 97 (02/17 0600) Resp:  [16-21] 19 (02/17 0600) BP: (65-144)/(46-114) 128/81 mmHg (02/17 0600) SpO2:  [94 %-100 %] 97 % (02/17 0600) Weight:  [88.8 kg (195  lb 12.3 oz)] 88.8 kg (195 lb 12.3 oz) (02/17 0400)  Physical Examination: General:  Awake, no acute distress Neuro:  Oriented x 3,  nonfocal HEENT:  PERRL, pink conjunctivae, dry mucous membranes Neck:  Supple, no JVD   Cardiovascular:  Irregular rhythm, no M/R/G Lungs:  CTA, no W/R/R Abdomen:  Soft, nontender, mildly distended, bowel sounds present Musculoskeletal:  Moves all extremities, 1+ pedal edema Skin:  No rash  I have reviewed the labs and personally reviewed the radiology imaging since admission.  Results for orders placed during the hospital encounter of 07/24/12 (from the past 24 hour(s))  APTT     Status: None   Collection Time    07/25/12 10:14 AM      Result Value Range   aPTT 34  24 - 37 seconds  PROTIME-INR     Status: None   Collection Time    07/25/12 10:14 AM      Result Value Range   Prothrombin Time 14.3  11.6 - 15.2 seconds   INR 1.13  0.00 - 1.49  GLUCOSE, CAPILLARY     Status: Abnormal   Collection  Time    07/25/12 11:29 AM      Result Value Range   Glucose-Capillary 250 (*) 70 - 99 mg/dL   Comment 1 Documented in Chart     Comment 2 Notify RN    URINALYSIS, ROUTINE W REFLEX MICROSCOPIC     Status: Abnormal   Collection Time    07/25/12  2:08 PM      Result Value Range   Color, Urine YELLOW  YELLOW   APPearance CLEAR  CLEAR   Specific Gravity, Urine >1.046 (*) 1.005 - 1.030   pH 5.0  5.0 - 8.0   Glucose, UA 500 (*) NEGATIVE mg/dL   Hgb urine dipstick NEGATIVE  NEGATIVE   Bilirubin Urine NEGATIVE  NEGATIVE   Ketones, ur 15 (*) NEGATIVE mg/dL   Protein, ur 161 (*) NEGATIVE mg/dL   Urobilinogen, UA 0.2  0.0 - 1.0 mg/dL   Nitrite NEGATIVE  NEGATIVE   Leukocytes, UA NEGATIVE  NEGATIVE  URINE MICROSCOPIC-ADD ON     Status: Abnormal   Collection Time    07/25/12  2:08 PM      Result Value Range   Squamous Epithelial / LPF RARE  RARE   WBC, UA 0-2  <3 WBC/hpf   RBC / HPF 0-2  <3 RBC/hpf   Bacteria, UA MANY (*) RARE   Urine-Other MUCOUS PRESENT    LACTIC ACID, PLASMA     Status: None   Collection Time    07/25/12  2:33 PM      Result Value Range   Lactic Acid, Venous 1.4  0.5 - 2.2 mmol/L  GLUCOSE, CAPILLARY     Status: Abnormal   Collection Time    07/25/12  4:02 PM      Result Value Range   Glucose-Capillary 116 (*) 70 - 99 mg/dL   Comment 1 Documented in Chart     Comment 2 Notify RN    HEPARIN LEVEL (UNFRACTIONATED)     Status: Abnormal   Collection Time    07/25/12  5:53 PM      Result Value Range   Heparin Unfractionated <0.10 (*) 0.30 - 0.70 IU/mL  HEPARIN LEVEL (UNFRACTIONATED)     Status: Abnormal   Collection Time    07/26/12  3:30 AM      Result Value Range   Heparin Unfractionated <0.10 (*) 0.30 -  0.70 IU/mL  BASIC METABOLIC PANEL     Status: Abnormal   Collection Time    07/26/12  3:30 AM      Result Value Range   Sodium 132 (*) 135 - 145 mEq/L   Potassium 3.1 (*) 3.5 - 5.1 mEq/L   Chloride 105  96 - 112 mEq/L   CO2 17 (*) 19 - 32 mEq/L    Glucose, Bld 161 (*) 70 - 99 mg/dL   BUN 14  6 - 23 mg/dL   Creatinine, Ser 1.30  0.50 - 1.35 mg/dL   Calcium 7.2 (*) 8.4 - 10.5 mg/dL   GFR calc non Af Amer 65 (*) >90 mL/min   GFR calc Af Amer 76 (*) >90 mL/min  MAGNESIUM     Status: None   Collection Time    07/26/12  3:30 AM      Result Value Range   Magnesium 1.5  1.5 - 2.5 mg/dL  CBC     Status: Abnormal   Collection Time    07/26/12  3:30 AM      Result Value Range   WBC 12.0 (*) 4.0 - 10.5 K/uL   RBC 3.46 (*) 4.22 - 5.81 MIL/uL   Hemoglobin 9.7 (*) 13.0 - 17.0 g/dL   HCT 86.5 (*) 78.4 - 69.6 %   MCV 84.7  78.0 - 100.0 fL   MCH 28.0  26.0 - 34.0 pg   MCHC 33.1  30.0 - 36.0 g/dL   RDW 29.5 (*) 28.4 - 13.2 %   Platelets 134 (*) 150 - 400 K/uL  GLUCOSE, CAPILLARY     Status: Abnormal   Collection Time    07/26/12  7:45 AM      Result Value Range   Glucose-Capillary 218 (*) 70 - 99 mg/dL   Comment 1 Documented in Chart     Comment 2 Notify RN       Principal Problem:   Diarrhea Active Problems:   Hypertensive heart disease   Hypercholesterolemia   Type 2 diabetes mellitus with vascular disease   Benign prostatic hypertrophy   Emphysema   Squamous cell lung cancer left upper lobe   COPD (chronic obstructive pulmonary disease)   CAD (coronary artery disease), native coronary artery   Atrial fibrillation   Anemia of chronic disease   Leukocytosis   Hypokalemia   Coronary artery disease   C. difficile colitis   Sepsis(995.91)   Rapid atrial fibrillation   Recent Labs Lab 07/25/12 1433  LATICACIDVEN 1.4    Recent Labs Lab 07/24/12 1800 07/25/12 0308 07/26/12 0330  WBC 16.5* 16.6* 12.0*      ASSESSMENT: 54) 78 year old male with Clostridium difficile colitis and hypotension with adequate response after IV fluid resuscitation. 2) COPD stable 3) Lung cancer 4) DM2 5) CAD stable 6) Atrial fibrillation with RVR, improved after IVF resuscitation   - 07/26/2012: Blood pressure continues to be  normal and diarrhea is improving. In addition his abdomen is soft and nontender. Lactate is normal and white count is trending down. All point with a favorable course  RECOMMENDATIONS: - Continue antibiotics - Will follow cultures - Adequate response to IVF resuscitation, MAP goal of 60 to 65. If becomes hypotensive will likely need central line and pressors. - Replenish electrolytes as necessary; recommend checking magnesium and phosphorus daily and keeping potassium greater than 4 - Rest of medical management per primary team.  - Critical care medicine will sign off.  - Please give him outpatient followup  with pulmonary medicine for his COPD   Dr. Kalman Shan, M.D., [facility].C.P Pulmonary and Critical Care Medicine Staff Physician [place] [facility]  Pulmonary and Critical Care Pager: [phone], If no answer or between  15:00h - 7:00h: call [phone]  07/26/2012 10:43 AM

## 2012-07-26 NOTE — Evaluation (Addendum)
Physical Therapy Evaluation Patient Details Name: Brandon Clay MRN: [id number] DOB: Apr 17, 1935 Today's Date: 07/26/2012 Time: 6644-0347 PT Time Calculation (min): 22 min  PT Assessment / Plan / Recommendation Clinical Impression  Pt, is 78 yo male admitted with diarrhea, Afib/RVR. Pt. tolerated OOB to recliner. HR 104-122 briefly. Pt. will benefit from PT while in acute care. Pt. may benefit from SNF. No family present to discuss caregiver availability.    PT Assessment  Patient needs continued PT services    Follow Up Recommendations  Home health PT;SNF    Does the patient have the potential to tolerate intense rehabilitation      Barriers to Discharge Decreased caregiver support      Equipment Recommendations  None recommended by PT    Recommendations for Other Services OT consult   Frequency Min 3X/week    Precautions / Restrictions Precautions Precautions: Fall Precaution Comments: monitor sats.HR  c diff  Pertinent Vitals/Pain HR up to 122 briefly. Generally low 100's. sats 96 % 2 l.      Mobility  Bed Mobility Bed Mobility: Supine to Sit;Sit to Supine Supine to Sit: 4: Min assist;HOB elevated;With rails Transfers Transfers: Sit to Stand;Stand to Sit;Stand Pivot Transfers Sit to Stand: 4: Min assist;With upper extremity assist;From elevated surface;From bed Stand to Sit: With upper extremity assist;4: Min assist;To chair/3-in-1;With armrests Stand Pivot Transfers: 4: Min assist;With armrests Details for Transfer Assistance: cues for safety and [place] of UE's Ambulation/Gait Ambulation/Gait Assistance: Not tested (comment)    Exercises     PT Diagnosis: Difficulty walking;Generalized weakness  PT Problem List: Decreased strength;Decreased activity tolerance;Decreased knowledge of precautions;Decreased safety awareness PT Treatment Interventions: DME instruction;Gait training;Functional mobility training;Therapeutic activities;Patient/family education   PT  Goals Acute Rehab PT Goals PT Goal Formulation: With patient Time For Goal Achievement: 08/09/12 Potential to Achieve Goals: Good Pt will go Supine/Side to Sit: Independently PT Goal: Supine/Side to Sit - Progress: Goal set today Pt will go Sit to Supine/Side: Independently PT Goal: Sit to Supine/Side - Progress: Goal set today Pt will go Sit to Stand: with supervision PT Goal: Sit to Stand - Progress: Goal set today Pt will go Stand to Sit: with supervision PT Goal: Stand to Sit - Progress: Goal set today Pt will Ambulate: 16 - 50 feet;with supervision;with rolling walker PT Goal: Ambulate - Progress: Goal set today  Visit Information  Last PT Received On: 07/26/12 Assistance Needed: +1    Subjective Data  Subjective: . I have to have surgery. Patient Stated Goal: to go home   Prior Functioning  Home Living Lives With: Spouse Available Help at Discharge: Family Type of Home: House Home Access: Ramped entrance Home Layout: One level Home Adaptive Equipment: Walker - rolling;Wheelchair - manual Prior Function Level of Independence: Independent with assistive device(s) Communication Communication: No difficulties    Cognition  Cognition Overall Cognitive Status: Appears within functional limits for tasks assessed/performed Arousal/Alertness: Awake/alert Orientation Level: Appears intact for tasks assessed Behavior During Session: [facility] for tasks performed    Extremity/Trunk Assessment Right Upper Extremity Assessment RUE ROM/Strength/Tone: [facility] for tasks assessed Left Upper Extremity Assessment LUE ROM/Strength/Tone: [facility] for tasks assessed Right Lower Extremity Assessment RLE ROM/Strength/Tone: [facility] for tasks assessed Left Lower Extremity Assessment LLE ROM/Strength/Tone: [facility] for tasks assessed   Balance Balance Balance Assessed: Yes Static Sitting Balance Static Sitting - Balance Support: Bilateral upper extremity supported Static Sitting - Level of Assistance: 5:  Stand by assistance  End of Session PT - End of Session Activity Tolerance:  Patient tolerated treatment well Patient left: in chair;with call bell/phone within reach Nurse Communication: Mobility status  GP     Rada Hay 07/26/2012, 3:47 PM [phone]

## 2012-07-26 NOTE — Progress Notes (Signed)
Inpatient Diabetes Program Recommendations  AACE/ADA: New Consensus Statement on Inpatient Glycemic Control (2013)  Target Ranges:  Prepandial:   less than 140 mg/dL      Peak postprandial:   less than 180 mg/dL (1-2 hours)      Critically ill patients:  140 - 180 mg/dL   Reason for Visit: Hyperglycemia  Results for BENJERMIN, KORBER (MRN [id number]) as of 07/26/2012 14:40  Ref. Range 07/24/2012 23:13 07/25/2012 07:35 07/25/2012 11:29 07/25/2012 16:02 07/26/2012 07:45 07/26/2012 11:23  Glucose-Capillary Latest Range: 70-99 mg/dL 409 (H) 811 (H) 914 (H) 116 (H) 218 (H) 204 (H)    Inpatient Diabetes Program Recommendations Insulin - Basal: Add Lantus 10 units QHS Diet: Change to CHO mod medium  Note: Will continue to follow.  Thank you. Ailene Ards, RD, LDN, CDE Inpatient Diabetes Coordinator [phone]

## 2012-07-26 NOTE — Progress Notes (Signed)
ANTICOAGULATION CONSULT NOTE - Follow Up Consult  Pharmacy Consult for IV heparin Indication: atrial fibrillation  No Known Allergies  Patient Measurements: Height: 5' 10.08" (178 cm) Weight: 179 lb 7.3 oz (81.4 kg) IBW/kg (Calculated) : 73.18 Heparin Dosing Weight: 81.4kg  Vital Signs: Temp: 99.9 F (37.7 C) (02/17 0000) Temp src: Oral (02/17 0000) BP: 135/69 mmHg (02/17 0000) Pulse Rate: 89 (02/17 0000)  Labs:  Recent Labs  07/24/12 1800 07/24/12 2140 07/25/12 0308 07/25/12 0930 07/25/12 1014 07/25/12 1753 07/26/12 0330  HGB 10.6*  --  10.6*  --   --   --  9.7*  HCT 32.1*  --  31.8*  --   --   --  29.3*  PLT 151  --  167  --   --   --  134*  APTT  --   --   --   --  34  --   --   LABPROT  --   --   --   --  14.3  --   --   INR  --   --   --   --  1.13  --   --   HEPARINUNFRC  --   --   --   --   --  <0.10* <0.10*  CREATININE 1.04  --  1.15  --   --   --  1.08  TROPONINI <0.30 <0.30 <0.30 <0.30  --   --   --     Estimated Creatinine Clearance: 61.2 ml/min (by C-G formula based on Cr of 1.08).   Assessment: 30 yom with complex cardiac h/o (including afib) and recent dx lung cancer presented 2/15 with afib with RBR. IV heparin ordered.   Heparin level remains < 0.1 with heparin running at 1300 units/hr.  Previously ordered rebolus of 2500 units not charted as given on MAR but spoke with current RN who stated previous shift RN stated she gave the bolus when increasing the infusion rate.   Verified with RN that heparin running at 1300 units/hr and no interruption in infusion  Goal of Therapy:  Heparin level 0.3-0.7 units/ml Monitor platelets by anticoagulation protocol: Yes   Plan:   Re-bolus with 2000 units x1 then increase IV heparin to 1550 units/hr.   Recheck Heparin level in 8 hrs  Daily CBC and heparin level   Terrilee Files, PharmD 07/26/12 4:47 AM

## 2012-07-26 NOTE — Consult Note (Signed)
Name: Brandon Clay MRN: [id number] DOB: 02/08/1935    LOS: 2  PULMONARY / CRITICAL CARE MEDICINE  HPI:   78 years old male with PMH relevant for DM2, HTN, COPD, CKD, CAD, LUL mass with planned surgical resection in the near future. Presents with 2 days history of severe diarrhea. Tested C. Diff positive. Since admission with borderline hypotension, A fib with RVR and lethargy. CC consult called to assist with management. At the time of my exam the patient is awake, alert, oriented X 3, with BP 130/75, sat 98%, HR: 90's. The patient has received 4 L of IVF's.  The patient denies abdominal pain or nausea. Does not have CP, SOB. Has mild abdominal distention. Last episode of diarrhea was 3 hours ago.   Past Medical History  Diagnosis Date  . Hypertension   . Hypercholesterolemia     statin intolerant  . Diabetes mellitus, type 2   . Benign prostatic hypertrophy   . Emphysema   . Lung cancer     Followed by Dr. [name] (LUL squamous cell carcinoma)  . COPD (chronic obstructive pulmonary disease)   . CKD (chronic kidney disease)   . Colitis   . Coronary artery disease     Cath 07/02/12 3v CAD w/ chronic occlusion of RCA (fills from left collaterals), severe stenosis prox LCx & moderately severe dz in mid LAD; s/p PTCA/BMS to prox LCx 07/12/12   . Normocytic anemia    Past Surgical History  Procedure Laterality Date  . Hemorrhoid surgery     Prior to Admission medications   Medication Sig Start Date End Date Taking? Authorizing Provider  aspirin EC 81 MG tablet Take 81 mg by mouth every other day.   Yes Historical Provider, MD  budesonide-formoterol (SYMBICORT) 160-4.5 MCG/ACT inhaler Inhale 2 puffs into the lungs 2 (two) times daily.   Yes Historical Provider, MD  carvedilol (COREG) 25 MG tablet Take 25 mg by mouth 2 (two) times daily with a meal.  06/13/12  Yes Historical Provider, MD  finasteride (PROSCAR) 5 MG tablet Take 5 mg by mouth every morning.  05/31/12  Yes Historical  Provider, MD  metFORMIN (GLUCOPHAGE-XR) 500 MG 24 hr tablet Take 2 tablets (1,000 mg total) by mouth 2 (two) times daily. Please hold for 48hrs after heart catheterization. Resume on 07/15/12 07/13/12  Yes Jessica A Hope, PA-C  metroNIDAZOLE (FLAGYL) 500 MG tablet Take 1 tablet (500 mg total) by mouth 3 (three) times daily. 07/04/12  Yes Benny Lennert, MD  Tamsulosin HCl (FLOMAX) 0.4 MG CAPS Take 0.4 mg by mouth every morning.  05/31/12  Yes Historical Provider, MD  Ticagrelor (BRILINTA) 90 MG TABS tablet Take 1 tablet (90 mg total) by mouth 2 (two) times daily. 07/13/12  Yes Jessica A Hope, PA-C   Allergies No Known Allergies  Family History Family History  Problem Relation Age of Onset  . Stroke Mother   . Heart disease Father     died of heart attack  . Lung cancer Brother   . Hypertension Brother   . Hypothyroidism Brother   . Hyperlipidemia Sister   . Hyperlipidemia Sister   . Breast cancer Sister    Social History  reports that he quit smoking about 5 years ago. His smoking use included Cigarettes. He started smoking about 54 years ago. He has a 25 pack-year smoking history. He has never used smokeless tobacco. He reports that he does not drink alcohol or use illicit drugs.  Review Of Systems:  All systems reviewed and negative except for what I mentioned in the HPI..   Current Status:  Vital Signs: Temp:  [98.3 F (36.8 C)-99.9 F (37.7 C)] 99.9 F (37.7 C) (02/17 0000) Pulse Rate:  [77-113] 89 (02/17 0000) Resp:  [17-22] 18 (02/17 0000) BP: (65-135)/(43-82) 135/69 mmHg (02/17 0000) SpO2:  [89 %-100 %] 96 % (02/17 0000)  Physical Examination: General:  Awake, no acute distress Neuro:  Oriented x 3,  nonfocal HEENT:  PERRL, pink conjunctivae, dry mucous membranes Neck:  Supple, no JVD   Cardiovascular:  Irregular rhythm, no M/R/G Lungs:  CTA, no W/R/R Abdomen:  Soft, nontender, mildly distended, bowel sounds present Musculoskeletal:  Moves all extremities, 1+ pedal  edema Skin:  No rash  I have reviewed the labs and personally reviewed the radiology imaging since admission.  Results for orders placed during the hospital encounter of 07/24/12 (from the past 24 hour(s))  MRSA PCR SCREENING     Status: None   Collection Time    07/25/12  2:10 AM      Result Value Range   MRSA by PCR NEGATIVE  NEGATIVE  TROPONIN I     Status: None   Collection Time    07/25/12  3:08 AM      Result Value Range   Troponin I <0.30  <0.30 ng/mL  BASIC METABOLIC PANEL     Status: Abnormal   Collection Time    07/25/12  3:08 AM      Result Value Range   Sodium 132 (*) 135 - 145 mEq/L   Potassium 3.4 (*) 3.5 - 5.1 mEq/L   Chloride 97  96 - 112 mEq/L   CO2 21  19 - 32 mEq/L   Glucose, Bld 193 (*) 70 - 99 mg/dL   BUN 16  6 - 23 mg/dL   Creatinine, Ser 1.47  0.50 - 1.35 mg/dL   Calcium 8.1 (*) 8.4 - 10.5 mg/dL   GFR calc non Af Amer 60 (*) >90 mL/min   GFR calc Af Amer 70 (*) >90 mL/min  CBC     Status: Abnormal   Collection Time    07/25/12  3:08 AM      Result Value Range   WBC 16.6 (*) 4.0 - 10.5 K/uL   RBC 3.76 (*) 4.22 - 5.81 MIL/uL   Hemoglobin 10.6 (*) 13.0 - 17.0 g/dL   HCT 82.9 (*) 56.2 - 13.0 %   MCV 84.6  78.0 - 100.0 fL   MCH 28.2  26.0 - 34.0 pg   MCHC 33.3  30.0 - 36.0 g/dL   RDW 86.5 (*) 78.4 - 69.6 %   Platelets 167  150 - 400 K/uL  GLUCOSE, CAPILLARY     Status: Abnormal   Collection Time    07/25/12  7:35 AM      Result Value Range   Glucose-Capillary 185 (*) 70 - 99 mg/dL   Comment 1 Documented in Chart     Comment 2 Notify RN    TROPONIN I     Status: None   Collection Time    07/25/12  9:30 AM      Result Value Range   Troponin I <0.30  <0.30 ng/mL  APTT     Status: None   Collection Time    07/25/12 10:14 AM      Result Value Range   aPTT 34  24 - 37 seconds  PROTIME-INR     Status: None   Collection Time  07/25/12 10:14 AM      Result Value Range   Prothrombin Time 14.3  11.6 - 15.2 seconds   INR 1.13  0.00 - 1.49   GLUCOSE, CAPILLARY     Status: Abnormal   Collection Time    07/25/12 11:29 AM      Result Value Range   Glucose-Capillary 250 (*) 70 - 99 mg/dL   Comment 1 Documented in Chart     Comment 2 Notify RN    URINALYSIS, ROUTINE W REFLEX MICROSCOPIC     Status: Abnormal   Collection Time    07/25/12  2:08 PM      Result Value Range   Color, Urine YELLOW  YELLOW   APPearance CLEAR  CLEAR   Specific Gravity, Urine >1.046 (*) 1.005 - 1.030   pH 5.0  5.0 - 8.0   Glucose, UA 500 (*) NEGATIVE mg/dL   Hgb urine dipstick NEGATIVE  NEGATIVE   Bilirubin Urine NEGATIVE  NEGATIVE   Ketones, ur 15 (*) NEGATIVE mg/dL   Protein, ur 045 (*) NEGATIVE mg/dL   Urobilinogen, UA 0.2  0.0 - 1.0 mg/dL   Nitrite NEGATIVE  NEGATIVE   Leukocytes, UA NEGATIVE  NEGATIVE  URINE MICROSCOPIC-ADD ON     Status: Abnormal   Collection Time    07/25/12  2:08 PM      Result Value Range   Squamous Epithelial / LPF RARE  RARE   WBC, UA 0-2  <3 WBC/hpf   RBC / HPF 0-2  <3 RBC/hpf   Bacteria, UA MANY (*) RARE   Urine-Other MUCOUS PRESENT    LACTIC ACID, PLASMA     Status: None   Collection Time    07/25/12  2:33 PM      Result Value Range   Lactic Acid, Venous 1.4  0.5 - 2.2 mmol/L  GLUCOSE, CAPILLARY     Status: Abnormal   Collection Time    07/25/12  4:02 PM      Result Value Range   Glucose-Capillary 116 (*) 70 - 99 mg/dL   Comment 1 Documented in Chart     Comment 2 Notify RN    HEPARIN LEVEL (UNFRACTIONATED)     Status: Abnormal   Collection Time    07/25/12  5:53 PM      Result Value Range   Heparin Unfractionated <0.10 (*) 0.30 - 0.70 IU/mL    Laboratory data reviewed.   CXR:  Known LUL mass, cardiomegaly.  ECG:  A. Fib with RVR Lines: Peripheral lines Cultures: Pending  Antibiotics: - PO vancomycin - IV flagyl   Principal Problem:   Diarrhea Active Problems:   Hypertensive heart disease   Hypercholesterolemia   Type 2 diabetes mellitus with vascular disease   Benign prostatic  hypertrophy   Emphysema   Squamous cell lung cancer left upper lobe   COPD (chronic obstructive pulmonary disease)   CAD (coronary artery disease), native coronary artery   Atrial fibrillation   Anemia of chronic disease   Leukocytosis   Hypokalemia   Coronary artery disease   C. difficile colitis   Sepsis(995.91)   Rapid atrial fibrillation   ASSESSMENT: 47) 78 year old male with Clostridium difficile colitis and hypotension with adequate response after IV fluid resuscitation. 2) COPD stable 3) Lung cancer 4) DM2 5) CAD stable 6) Atrial fibrillation with RVR, improved after IVF resuscitation   RECOMMENDATIONS: - Continue antibiotics - Will follow cultures - Adequate response to IVF resuscitation, MAP goal of 60 to 65.  If becomes hypotensive will likely need central line and pressors. We will monitor closely. - Replenish electrolytes as necessary - Rest of medical management per primary team.    Critical Care Time devoted to patient care services described in this note is: 43 Minutes  Overton Mam, M.D. Pulmonary and Critical Care Medicine [facility] Pager: [phone]  07/26/2012, 12:52 AM

## 2012-07-26 NOTE — Progress Notes (Signed)
    SUBJECTIVE: Feeling better after hydration. No SOB or chest pain.   BP 128/81  Pulse 97  Temp(Src) 98.4 F (36.9 C) (Oral)  Resp 19  Ht 5' 10.08" (1.78 m)  Wt 195 lb 12.3 oz (88.8 kg)  BMI 28.03 kg/m2  SpO2 97%  Intake/Output Summary (Last 24 hours) at 07/26/12 1004 Last data filed at 07/26/12 0700  Gross per 24 hour  Intake 7387.5 ml  Output   1141 ml  Net 6246.5 ml    PHYSICAL EXAM General: Well developed, well nourished, in no acute distress. Alert and oriented x 3.  Psych:  Good affect, responds appropriately Neck: No JVD. No masses noted.  Lungs: Clear bilaterally with no wheezes or rhonci noted.  Heart: RRR with no murmurs noted. Abdomen: Bowel sounds are present. Soft, non-tender.  Extremities: No lower extremity edema.   LABS: Basic Metabolic Panel:  Recent Labs  81/19/14 1800 07/24/12 2140 07/25/12 0308 07/26/12 0330  NA 135  --  132* 132*  K 3.3*  --  3.4* 3.1*  CL 101  --  97 105  CO2 19  --  21 17*  GLUCOSE 187*  --  193* 161*  BUN 16  --  16 14  CREATININE 1.04  --  1.15 1.08  CALCIUM 8.0*  --  8.1* 7.2*  MG  --  1.2*  --  1.5  PHOS  --  2.8  --   --    CBC:  Recent Labs  07/24/12 1800 07/25/12 0308 07/26/12 0330  WBC 16.5* 16.6* 12.0*  NEUTROABS 14.1*  --   --   HGB 10.6* 10.6* 9.7*  HCT 32.1* 31.8* 29.3*  MCV 84.9 84.6 84.7  PLT 151 167 134*   Cardiac Enzymes:  Recent Labs  07/24/12 2140 07/25/12 0308 07/25/12 0930  TROPONINI <0.30 <0.30 <0.30   Current Meds: . aspirin EC  81 mg Oral QODAY  . budesonide-formoterol  2 puff Inhalation BID  . finasteride  5 mg Oral q morning - 10a  . heparin  2,500 Units Intravenous Once  . insulin aspart  0-9 Units Subcutaneous TID WC  . metronidazole  500 mg Intravenous Q8H  . sodium chloride  3 mL Intravenous Q12H  . Tamsulosin HCl  0.4 mg Oral q morning - 10a  . Ticagrelor  90 mg Oral BID  . vancomycin  125 mg Oral Q6H     ASSESSMENT AND PLAN:  1. New onset atrial  fibrillation: in setting of dehydration/sepsis with C. Diff colitis. Rate better controlled after hydration. Hopefully, he can tolerate beta blockade now that his volume status is corrected. Will start Lopressor 12.5 mg po BID.  Continue heparin drip for now. Long term anti-coagulation with be tricky since he needs to have lobectomy for lung cancer. Will forward this to Dr. [name] who is planning his lobectomy. May need coumadin until the week that surgery is planned. The surgery will be delayed with his ongoing C. Diff colitis.   2. Coronary artery disease: recent bare-metal stent placement Circumflex artery 07/12/12. Continue ASA and Brilinta.   3. C. Diff colitis with significant diarrhea: Treatment per primary team.   4. Hyperlipidemia with statin intolerance   5. History of squamous cell lung cancer: Will need lobectomy in March when colitis cleared.    Brandon Clay,Brandon Clay  2/17/201410:04 AM

## 2012-07-26 NOTE — Progress Notes (Addendum)
ANTICOAGULATION CONSULT NOTE - Follow Up Consult  Pharmacy Consult for IV Heparin Indication: atrial fibrillation  No Known Allergies  Patient Measurements: Height: 5' 10.08" (178 cm) Weight: 195 lb 12.3 oz (88.8 kg) IBW/kg (Calculated) : 73.18 Heparin Dosing Weight: 81.4 kg  Vital Signs: Temp: 98.3 F (36.8 C) (02/17 1200) Temp src: Oral (02/17 1200) BP: 164/72 mmHg (02/17 1200) Pulse Rate: 103 (02/17 1200)  Labs:  Recent Labs  07/24/12 1800 07/24/12 2140 07/25/12 0308 07/25/12 0930 07/25/12 1014 07/25/12 1753 07/26/12 0330 07/26/12 1310  HGB 10.6*  --  10.6*  --   --   --  9.7*  --   HCT 32.1*  --  31.8*  --   --   --  29.3*  --   PLT 151  --  167  --   --   --  134*  --   APTT  --   --   --   --  34  --   --   --   LABPROT  --   --   --   --  14.3  --   --   --   INR  --   --   --   --  1.13  --   --   --   HEPARINUNFRC  --   --   --   --   --  <0.10* <0.10* 0.10*  CREATININE 1.04  --  1.15  --   --   --  1.08  --   TROPONINI <0.30 <0.30 <0.30 <0.30  --   --   --   --     Estimated Creatinine Clearance: 66.4 ml/min (by C-G formula based on Cr of 1.08).   Medications:  Infusions:  . sodium chloride 150 mL (07/26/12 0135)  . heparin 1,550 Units/hr (07/26/12 0500)  . [DISCONTINUED] diltiazem (CARDIZEM) infusion Stopped (07/25/12 0439)  . [DISCONTINUED] heparin 1,000 Units/hr (07/25/12 1135)  . [DISCONTINUED] heparin 1,300 Units/hr (07/26/12 1610)    Assessment:  Brandon Clay with complex cardiac h/o (including afib) and recent dx lung cancer presented 2/15 with afib with RBR. IV heparin ordered.   Heparin infusing at 1550 units/hr; no interruptions or infusion problems noted per RN  CBC: Hgb decreased to 9.7 (from 10.6) and Plt decreased to 134.  RN reports no complications or bleeding.  Heparin level remains low (0.1) despite bolus and rate increase this morning.   Goal of Therapy:  Heparin level 0.3-0.7 units/ml Monitor platelets by anticoagulation  protocol: Yes   Plan:   Repeat heparin 2000 units bolus IV x 1  Increase to heparin IV infusion at 1800 units/hr  Heparin level 8 hours after starting  Daily heparin level and CBC  Continue to monitor H&H and platelets  Follow up long-term plan for anticoagulation.     Lynann Beaver PharmD, BCPS Pager [phone] 07/26/2012 2:22 PM

## 2012-07-27 ENCOUNTER — Encounter: Payer: Medicare Other | Admitting: Physician Assistant

## 2012-07-27 DIAGNOSIS — I2510 Atherosclerotic heart disease of native coronary artery without angina pectoris: Secondary | ICD-10-CM

## 2012-07-27 LAB — BASIC METABOLIC PANEL
BUN: 11 mg/dL (ref 6–23)
CO2: 18 mEq/L — ABNORMAL LOW (ref 19–32)
Calcium: 7.1 mg/dL — ABNORMAL LOW (ref 8.4–10.5)
Chloride: 102 mEq/L (ref 96–112)
Creatinine, Ser: 0.98 mg/dL (ref 0.50–1.35)
Glucose, Bld: 172 mg/dL — ABNORMAL HIGH (ref 70–99)

## 2012-07-27 LAB — HEPARIN LEVEL (UNFRACTIONATED)
Heparin Unfractionated: 0.32 IU/mL (ref 0.30–0.70)
Heparin Unfractionated: 0.35 IU/mL (ref 0.30–0.70)

## 2012-07-27 LAB — GLUCOSE, CAPILLARY
Glucose-Capillary: 178 mg/dL — ABNORMAL HIGH (ref 70–99)
Glucose-Capillary: 220 mg/dL — ABNORMAL HIGH (ref 70–99)

## 2012-07-27 LAB — CBC
HCT: 29.6 % — ABNORMAL LOW (ref 39.0–52.0)
Hemoglobin: 10.1 g/dL — ABNORMAL LOW (ref 13.0–17.0)
MCH: 28.6 pg (ref 26.0–34.0)
MCV: 83.9 fL (ref 78.0–100.0)
RBC: 3.53 MIL/uL — ABNORMAL LOW (ref 4.22–5.81)
WBC: 9.5 10*3/uL (ref 4.0–10.5)

## 2012-07-27 MED ORDER — POTASSIUM PHOSPHATE DIBASIC 3 MMOLE/ML IV SOLN
30.0000 mmol | Freq: Once | INTRAVENOUS | Status: AC
  Administered 2012-07-27: 30 mmol via INTRAVENOUS
  Filled 2012-07-27: qty 10

## 2012-07-27 MED ORDER — METOPROLOL TARTRATE 50 MG PO TABS
50.0000 mg | ORAL_TABLET | Freq: Two times a day (BID) | ORAL | Status: DC
  Administered 2012-07-27 – 2012-07-29 (×5): 50 mg via ORAL
  Filled 2012-07-27 (×7): qty 1

## 2012-07-27 MED ORDER — WARFARIN - PHARMACIST DOSING INPATIENT
Freq: Every day | Status: DC
  Administered 2012-07-29 – 2012-07-30 (×2)

## 2012-07-27 MED ORDER — METOPROLOL TARTRATE 25 MG PO TABS
25.0000 mg | ORAL_TABLET | Freq: Two times a day (BID) | ORAL | Status: DC
  Filled 2012-07-27 (×2): qty 1

## 2012-07-27 MED ORDER — WARFARIN SODIUM 5 MG PO TABS
5.0000 mg | ORAL_TABLET | Freq: Once | ORAL | Status: AC
  Administered 2012-07-27: 5 mg via ORAL
  Filled 2012-07-27: qty 1

## 2012-07-27 NOTE — Progress Notes (Signed)
Pt c/o" the feeling to void but nothing to come out". Bladder scan completed and yielded . Notified Dr. Gonzella Lex. I&O cath orders given, if pt continues to have complications may require re-insertion of cath. Pt aware of the situation and verbalized understanding. Otherwise, pt had an uneventful day. Tolerated sitting up in the chair and ambulating in the room with walker. VSS. With HR managed. Will continue to monitor and carry out specified orders.

## 2012-07-27 NOTE — Progress Notes (Signed)
ANTICOAGULATION CONSULT NOTE - Follow Up Consult  Pharmacy Consult for IV Heparin Indication: atrial fibrillation  No Known Allergies  Patient Measurements: Height: 5' 10.08" (178 cm) Weight: 195 lb 12.3 oz (88.8 kg) IBW/kg (Calculated) : 73.18 Heparin Dosing Weight: 81.4 kg  Vital Signs: Temp: 98.6 F (37 C) (02/18 0000) Temp src: Oral (02/18 0000) BP: 120/64 mmHg (02/18 0000) Pulse Rate: 92 (02/17 2200)  Labs:  Recent Labs  07/24/12 1800 07/24/12 2140 07/25/12 0308 07/25/12 0930 07/25/12 1014  07/26/12 0330 07/26/12 1310 07/26/12 2305  HGB 10.6*  --  10.6*  --   --   --  9.7*  --   --   HCT 32.1*  --  31.8*  --   --   --  29.3*  --   --   PLT 151  --  167  --   --   --  134*  --   --   APTT  --   --   --   --  34  --   --   --   --   LABPROT  --   --   --   --  14.3  --   --   --   --   INR  --   --   --   --  1.13  --   --   --   --   HEPARINUNFRC  --   --   --   --   --   < > <0.10* 0.10* 0.23*  CREATININE 1.04  --  1.15  --   --   --  1.08  --   --   TROPONINI <0.30 <0.30 <0.30 <0.30  --   --   --   --   --   < > = values in this interval not displayed.  Estimated Creatinine Clearance: 66.4 ml/min (by C-G formula based on Cr of 1.08).   Medications:  Infusions:  . sodium chloride 150 mL (07/26/12 0135)  . heparin 1,800 Units/hr (07/26/12 2200)  . [DISCONTINUED] heparin 1,300 Units/hr (07/26/12 1610)    Assessment:  Brandon Clay with complex cardiac h/o (including afib) and recent dx lung cancer presented 2/15 with afib with RBR. IV heparin ordered.   Heparin infusing at 1550 units/hr; no interruptions or infusion problems noted per RN  CBC: Hgb decreased to 9.7 (from 10.6) and Plt decreased to 134. Heparin level remains low (0.23) after 2000 unit and drip @ 1800 units/hr.  No IV interuptions or bleeding noted per RN.   Goal of Therapy:  Heparin level 0.3-0.7 units/ml Monitor platelets by anticoagulation protocol: Yes   Plan:   Increase to heparin  IV infusion at 2100 units/hr  Heparin level 8 hours after increasing  Daily heparin level and CBC  Continue to monitor H&H and platelets  Follow up long-term plan for anticoagulation.     Brandon Clay 07/27/2012 12:38 AM  .  07/27/2012 12:35 AM

## 2012-07-27 NOTE — Progress Notes (Signed)
ANTICOAGULATION CONSULT NOTE - Follow Up Consult  Pharmacy Consult for IV Heparin, Warfarin Indication: atrial fibrillation  No Known Allergies  Patient Measurements: Height: 5' 10.08" (178 cm) Weight: 197 lb 12 oz (89.7 kg) IBW/kg (Calculated) : 73.18 Heparin Dosing Weight: 81.4 kg  Vital Signs: Temp: 99.3 F (37.4 C) (02/18 0400) Temp src: Oral (02/18 0400) BP: 154/82 mmHg (02/18 0600) Pulse Rate: 111 (02/18 0600)  Labs:  Recent Labs  07/24/12 2140 07/25/12 0308 07/25/12 0930 07/25/12 1014  07/26/12 0330 07/26/12 1310 07/26/12 2305 07/27/12 0342  HGB  --  10.6*  --   --   --  9.7*  --   --  10.1*  HCT  --  31.8*  --   --   --  29.3*  --   --  29.6*  PLT  --  167  --   --   --  134*  --   --  131*  APTT  --   --   --  34  --   --   --   --   --   LABPROT  --   --   --  14.3  --   --   --   --   --   INR  --   --   --  1.13  --   --   --   --   --   HEPARINUNFRC  --   --   --   --   < > <0.10* 0.10* 0.23*  --   CREATININE  --  1.15  --   --   --  1.08  --   --  0.98  TROPONINI <0.30 <0.30 <0.30  --   --   --   --   --   --   < > = values in this interval not displayed.  Estimated Creatinine Clearance: 73.5 ml/min (by C-G formula based on Cr of 0.98).   Medications:  Infusions:  . sodium chloride 150 mL (07/26/12 0135)  . heparin 2,100 Units/hr (07/27/12 1610)    Assessment:  77 yom with complex cardiac h/o (including afib) and recent dx lung cancer presented 2/15 with afib with RBR. IV heparin started 2/16, Warfarin started 2/18.   Heparin infusing at 2100 units/hr; no interruptions or infusion problems noted per RN  CBC: Hgb and Plt are low/stable.  RN reports no complications or bleeding.  Heparin level (0.32) therapeutic. Day #1 Warfarin/Heparin overlap for minimum of 5 days and until INR > 2 for 24 hours.    Goal of Therapy:  Heparin level 0.3-0.7 units/ml Monitor platelets by anticoagulation protocol: Yes   Plan:   Warfarin 5mg  PO tonight x1  dose  Continue heparin IV infusion at 2100 units/hr  Heparin level 8 hours to confirm therapeutic level  Daily heparin level, INR, and CBC  Continue to monitor H&H and platelets     Lynann Beaver PharmD, BCPS Pager [phone] 07/27/2012 10:33 AM

## 2012-07-27 NOTE — Progress Notes (Signed)
ANTICOAGULATION CONSULT NOTE - Follow Up Consult  Pharmacy Consult for IV Heparin, Warfarin Indication: atrial fibrillation  No Known Allergies  Patient Measurements: Height: 5' 10.08" (178 cm) Weight: 197 lb 12 oz (89.7 kg) IBW/kg (Calculated) : 73.18 Heparin Dosing Weight: 81.4 kg  Vital Signs: Temp: 98.4 F (36.9 C) (02/18 1600) Temp src: Oral (02/18 1600) BP: 137/81 mmHg (02/18 1800) Pulse Rate: 95 (02/18 1800)  Labs:  Recent Labs  07/24/12 2140  07/25/12 0308 07/25/12 0930 07/25/12 1014  07/26/12 0330  07/26/12 2305 07/27/12 0342 07/27/12 0930 07/27/12 1825  HGB  --   < > 10.6*  --   --   --  9.7*  --   --  10.1*  --   --   HCT  --   --  31.8*  --   --   --  29.3*  --   --  29.6*  --   --   PLT  --   --  167  --   --   --  134*  --   --  131*  --   --   APTT  --   --   --   --  34  --   --   --   --   --   --   --   LABPROT  --   --   --   --  14.3  --   --   --   --   --   --   --   INR  --   --   --   --  1.13  --   --   --   --   --   --   --   HEPARINUNFRC  --   --   --   --   --   < > <0.10*  < > 0.23*  --  0.32 0.35  CREATININE  --   --  1.15  --   --   --  1.08  --   --  0.98  --   --   TROPONINI <0.30  --  <0.30 <0.30  --   --   --   --   --   --   --   --   < > = values in this interval not displayed.  Estimated Creatinine Clearance: 73.5 ml/min (by C-G formula based on Cr of 0.98).   Medications:  Infusions:  . heparin 2,100 Units/hr (07/27/12 1726)  . [DISCONTINUED] sodium chloride 20 mL/hr at 07/27/12 1610    Assessment:  77 yom with complex cardiac h/o (including afib) and recent dx lung cancer presented 2/15 with afib with RBR. IV heparin started 2/16, Warfarin started 2/18.   Heparin infusing at 2100 units/hr  No complications or bleeding noted  Heparin level (0.35) therapeutic. (Heparin level therapeutic x 2 now) Day #1 Warfarin/Heparin overlap for minimum of 5 days and until INR > 2 for 24 hours.    Goal of Therapy:  Heparin  level 0.3-0.7 units/ml Monitor platelets by anticoagulation protocol: Yes   Plan:   Continue heparin IV infusion at 2100 units/hr  Daily heparin level, INR, and CBC  Continue to monitor H&H and platelets    Terrilee Files, PharmD 07/27/2012 7:46 PM

## 2012-07-27 NOTE — Progress Notes (Signed)
SUBJECTIVE: Feeling better. 2 episodes of diarrhea yesterday and one this am which is improved. No chest pain or SOB.   Tele: a. Fib, rates 100-110.   BP 154/82  Pulse 111  Temp(Src) 99.3 F (37.4 C) (Oral)  Resp 21  Ht 5' 10.08" (1.78 m)  Wt 197 lb 12 oz (89.7 kg)  BMI 28.31 kg/m2  SpO2 96%  Intake/Output Summary (Last 24 hours) at 07/27/12 0730 Last data filed at 07/27/12 9604  Gross per 24 hour  Intake 3139.65 ml  Output   1095 ml  Net 2044.65 ml    PHYSICAL EXAM General: Well developed, well nourished, in no acute distress. Alert and oriented x 3.  Psych:  Good affect, responds appropriately Neck: No JVD. No masses noted.  Lungs: Clear bilaterally with no wheezes or rhonci noted.  Heart: Irreg irreg with no murmurs noted. Abdomen: Bowel sounds are present. Soft, non-tender.  Extremities: Trace to 1+ bilateral lower extremity edema.   LABS: Basic Metabolic Panel:  Recent Labs  54/09/81 2140  07/26/12 0330 07/27/12 0342  NA  --   < > 132* 133*  K  --   < > 3.1* 3.1*  CL  --   < > 105 102  CO2  --   < > 17* 18*  GLUCOSE  --   < > 161* 172*  BUN  --   < > 14 11  CREATININE  --   < > 1.08 0.98  CALCIUM  --   < > 7.2* 7.1*  MG 1.2*  --  1.5 1.8  PHOS 2.8  --   --  1.9*  < > = values in this interval not displayed. CBC:  Recent Labs  07/24/12 1800  07/26/12 0330 07/27/12 0342  WBC 16.5*  < > 12.0* 9.5  NEUTROABS 14.1*  --   --   --   HGB 10.6*  < > 9.7* 10.1*  HCT 32.1*  < > 29.3* 29.6*  MCV 84.9  < > 84.7 83.9  PLT 151  < > 134* 131*  < > = values in this interval not displayed.  Current Meds: . aspirin EC  81 mg Oral QODAY  . budesonide-formoterol  2 puff Inhalation BID  . finasteride  5 mg Oral q morning - 10a  . insulin aspart  0-9 Units Subcutaneous TID WC  . metoprolol tartrate  25 mg Oral BID  . metronidazole  500 mg Intravenous Q8H  . potassium phosphate IVPB (mmol)  30 mmol Intravenous Once  . sodium chloride  3 mL Intravenous Q12H    . Tamsulosin HCl  0.4 mg Oral q morning - 10a  . Ticagrelor  90 mg Oral BID  . vancomycin  125 mg Oral Q6H     ASSESSMENT AND PLAN: 78 yo male with history of CAD with bare metal stent placement Circumflex artery 07/12/12 on ASA and Brilinta (plan for one month ending 08/09/12), newly diagnosed squamous cell lung cancer (awaiting lobectomy in March 2014 per Dr. [name]) admitted with C. Diff colitis, dehydration, atrial fibrillation with RVR.   1. New onset atrial fibrillation: In setting of dehydration/sepsis with C. Diff colitis. Rate better controlled after hydration and addition of metoprolol but still 100-110. Will increase Lopressor to 25 mg po BID. Continue heparin drip for now. I am anticipating he will need TEE guided cardioversion later this week if he remains in atrial fibrillation. Long term anti-coagulation with be tricky since he needs to have lobectomy for  lung cancer. The best plan will likely be a short course of coumadin following cardioversion which can be held the week of lobectomy (presumably the lobectomy can wait for another four weeks). Will ask pharmacy to start coumadin today. (Will not use newer anticoagulant such as Xarelto or Pradaxa since he is on Brilinta for anti-platelet therapy with recent coronary stent). Will forward this to Dr. [name] who is planning his lobectomy and his primary cardiologist Dr. Eden Emms. The surgery will be delayed with his ongoing C. Diff colitis.   2. Coronary artery disease: recent bare-metal stent placement Circumflex artery 07/12/12. Continue ASA and Brilinta.   3. C. Diff colitis with significant diarrhea: Improving. Treatment per primary team.   4. Hyperlipidemia with statin intolerance   5. History of squamous cell lung cancer: Will need lobectomy in March when colitis cleared. He has seen Dr. [name].     Kaelie Henigan  2/18/20147:30 AM

## 2012-07-27 NOTE — Progress Notes (Signed)
Physical Therapy Treatment Patient Details Name: Brandon Clay MRN: [id number] DOB: [birth year] Today's Date: 07/27/2012 Time: 6295-2841 PT Time Calculation (min): 28 min  PT Assessment / Plan / Recommendation Comments on Treatment Session  Pt. is very participatory. RN stated MD wanted pt. to mobilize in spite of Afib/Heparin. Pt only alowed to move from bed to recliner near bed with HR up to 142. continue PT. Pt. may benefit from SNF. Recommend OT.    Follow Up Recommendations  Home health PT;SNF     Does the patient have the potential to tolerate intense rehabilitation     Barriers to Discharge        Equipment Recommendations  None recommended by PT    Recommendations for Other Services OT consult  Frequency Min 3X/week   Plan Discharge plan remains appropriate;Frequency remains appropriate    Precautions / Restrictions Precautions Precautions: Fall Precaution Comments: d diff, monitor sats/HR due to afib., on home O2 PRN   Pertinent Vitals/Pain HR up to 142 while mobilizing but not sustained.  sats >96 % 2 l.   Mobility  Bed Mobility Supine to Sit: 4: Min assist;With rails;HOB elevated Transfers Sit to Stand: 4: Min assist;From bed;From chair/3-in-1;With upper extremity assist;With armrests Stand to Sit: To chair/3-in-1;With upper extremity assist;With armrests;4: Min assist Details for Transfer Assistance: cues for UE use to push from surface. Ambulation/Gait Ambulation/Gait Assistance: 4: Min assist Ambulation Distance (Feet): 5 Feet Assistive device: Rolling walker Ambulation/Gait Assistance Details: pt stepped sideways from [facility] to recliner. Gait Pattern: Step-to pattern    Exercises     PT Diagnosis:    PT Problem List:   PT Treatment Interventions:     PT Goals Acute Rehab PT Goals Pt will go Supine/Side to Sit: Independently PT Goal: Supine/Side to Sit - Progress: Progressing toward goal Pt will go Sit to Stand: with supervision PT Goal: Sit to  Stand - Progress: Progressing toward goal Pt will go Stand to Sit: with supervision PT Goal: Stand to Sit - Progress: Progressing toward goal Pt will Ambulate: 16 - 50 feet;with supervision;with rolling walker PT Goal: Ambulate - Progress: Progressing toward goal Pt will Perform Home Exercise Program: with supervision, verbal cues required/provided PT Goal: Perform Home Exercise Program - Progress: Goal set today  Visit Information  Last PT Received On: 07/27/12 Assistance Needed: +1 (+2 t walk when HR stable915-)    Subjective Data  Subjective: I will be glad when this is over(diarrhea)   Cognition  Cognition Overall Cognitive Status: Appears within functional limits for tasks assessed/performed    Balance  Static Sitting Balance Static Sitting - Balance Support: Bilateral upper extremity supported Static Sitting - Level of Assistance: 5: Stand by assistance  End of Session PT - End of Session Equipment Utilized During Treatment: Oxygen Activity Tolerance: Patient tolerated treatment well;Treatment limited secondary to medical complications (Comment) (HR up to 142 and limiting.) Patient left: in chair;with call bell/phone within reach Nurse Communication: Mobility status, HR incr.   GP     Rada Hay 07/27/2012, 10:33 [phone]

## 2012-07-27 NOTE — Progress Notes (Addendum)
[facility] PROGRESS NOTE  Brandon Clay ZOX:[id number] DOB: Oct 05, 1934 DOA: 07/24/2012 PCP: [name], MD  Brief narrative  78 year old male with extensive cardiac history with three-vessel CAD status post recent cardiac cath, left upper lobe, some cancer with planned lobectomy in 1 month presenting withwatery diarrhea almost 2 weeks of diarrhea with crampy abdominal pain with fever and leukocytosis secondary to sepsis with C. difficile colitis.    Assessment/Plan:  Sepsis secondary to C. difficile colitis  Patient presented with fever and significant leukocytosis and A. fib with RVR. Marland Kitchen Stool for C. difficile positive. CT scan of the abdomen and pelvis done on admission showing diffuse colitis.  -Patient hypotensive on 2/16 requiring almost 4 L of IV normal saline boluses followed by continues normal saline fusion. Blood pressure now stable. Patient initially placed on IV ciprofloxacin and Flagyl.  given diffuse and severe C. difficile colitis switched to oral vancomycin and Flagyl continued. leukocytosis now resolved. -Heart rate now elevated to 120s. -Blood pressure well maintained . discontinue IV fluids today.   A. fib with RVR.  -Likely triggered by sepsis. Patient placed on Cardizem drip on admission however became hypotensive. Started on IV heparin by cardiology and given [place] S2 of 4.  -2-D echo shows improved EF of 60-65%. On recent Myoview, EF was 45%  -Appreciate cardiology recommendations. Have initiated metoprolol and increased dose today to 50 mg twice a day. Cardiology plan to start patient on Coumadin. Since patient will be needing lobectomy for his -lung cancer any of future, cardiology recommended to place patient on a short course of Coumadin cardioversion and hold it the week of lobectomy. -Once he is maintained on Coumadin, we may discontinue his aspirin and continue Brilinta only. -Replenish low K and magnesium  Hypotension  Blood pressure now stable  and improved with IV fluids. Metoprolol resumed and does increased  COPD  - O2 via nasal cannula. Currently stable. Continue Symbicort  -PCCM and recommend pulmonary follow up as out patient. They will schedule an appointment  CAD  Has extensive three-vessel disease on recent cardiac cath and not a good candidate for CABG. Had a BMS placed in on recent cardiac cath. Being treated with aspirin and Ticagrelor for dual antiplatelet therapy.  intolerant to statins.   Hypokalemia/hypomagnesemia  Replenished   LUL, squamous cell cancer  Plan for lobectomy in 1 month   Anemia of chronic disease  Hemoglobin stable at this time   Diabetes mellitus  Holding metformin. Continue sliding scale insulin. Follow A1c   BPH  Continue finasteride and Flomax   Diet: Cardiac DVT prophylaxis: On IV heparin. Starting Coumadin from today.  Code Status: Full code  Family Communication: Wife and daughter involved in care Disposition Plan: Transfer to telemetry heart rate stable this afternoon  Consultants:  [facility]   Procedures:  Plan for cardioversion sometime later  this week   Antibiotics:   Flagyl (2/15)  Oral vancomycin (2/16)        HPI/Subjective: Had 5 bowel movements yesterday. Blood pressure has remained stable. Denies any symptoms otherwise. Heart rate elevated  Objective: Filed Vitals:   07/27/12 0400 07/27/12 0500 07/27/12 0600 07/27/12 0700  BP: 142/81 145/96 154/82 139/98  Pulse: 111 111 111 110  Temp: 99.3 F (37.4 C)     TempSrc: Oral     Resp: 21 22 21 22   Height:      Weight:      SpO2: 97% 97% 96% 97%    Intake/Output Summary (Last 24 hours)  at 07/27/12 0830 Last data filed at 07/27/12 0700  Gross per 24 hour  Intake 3384.65 ml  Output   1095 ml  Net 2289.65 ml   Filed Weights   07/24/12 2125 07/26/12 0400 07/27/12 0100  Weight: 81.4 kg (179 lb 7.3 oz) 88.8 kg (195 lb 12.3 oz) 89.7 kg (197 lb 12 oz)    Exam: General: Elderly male  lying in bed in no acute distress  HEENT: No pallor, moist oral mucosa  Cardiovascular: S1 And S2 irregular, no murmurs rub or gallop  Respiratory: Clear To auscultation bilaterally no added sounds  Abdomen: Soft, nontender, nondistended, bowel sounds present  Extremities: Warm, no edema  CNS: AAO x3   Data Reviewed: Basic Metabolic Panel:  Recent Labs Lab 07/24/12 1800 07/24/12 2140 07/25/12 0308 07/26/12 0330 07/27/12 0342  NA 135  --  132* 132* 133*  K 3.3*  --  3.4* 3.1* 3.1*  CL 101  --  97 105 102  CO2 19  --  21 17* 18*  GLUCOSE 187*  --  193* 161* 172*  BUN 16  --  16 14 11   CREATININE 1.04  --  1.15 1.08 0.98  CALCIUM 8.0*  --  8.1* 7.2* 7.1*  MG  --  1.2*  --  1.5 1.8  PHOS  --  2.8  --   --  1.9*   Liver Function Tests:  Recent Labs Lab 07/24/12 1800  AST 9  ALT 10  ALKPHOS 46  BILITOT 0.4  PROT 5.8*  ALBUMIN 2.4*   No results found for this basename: LIPASE, AMYLASE,  in the last 168 hours No results found for this basename: AMMONIA,  in the last 168 hours CBC:  Recent Labs Lab 07/24/12 1800 07/25/12 0308 07/26/12 0330 07/27/12 0342  WBC 16.5* 16.6* 12.0* 9.5  NEUTROABS 14.1*  --   --   --   HGB 10.6* 10.6* 9.7* 10.1*  HCT 32.1* 31.8* 29.3* 29.6*  MCV 84.9 84.6 84.7 83.9  PLT 151 167 134* 131*   Cardiac Enzymes:  Recent Labs Lab 07/24/12 1800 07/24/12 2140 07/25/12 0308 07/25/12 0930  TROPONINI <0.30 <0.30 <0.30 <0.30   BNP (last 3 results)  Recent Labs  07/24/12 2140  PROBNP 15551.0*   CBG:  Recent Labs Lab 07/25/12 1602 07/26/12 0745 07/26/12 1123 07/26/12 1722 07/27/12 0751  GLUCAP 116* 218* 204* 184* 178*    Recent Results (from the past 240 hour(s))  CLOSTRIDIUM DIFFICILE BY PCR     Status: Abnormal   Collection Time    07/24/12 11:53 PM      Result Value Range Status   C difficile by pcr POSITIVE (*) NEGATIVE Final   Comment: CRITICAL RESULT CALLED TO, READ BACK BY AND VERIFIED WITH:     ARNOLD,A RN  07/25/12 1124 WOOTEN,K  MRSA PCR SCREENING     Status: None   Collection Time    07/25/12  2:10 AM      Result Value Range Status   MRSA by PCR NEGATIVE  NEGATIVE Final   Comment:            The GeneXpert MRSA Assay (FDA     approved for NASAL specimens     only), is one component of a     comprehensive MRSA colonization     surveillance program. It is not     intended to diagnose MRSA     infection nor to guide or     monitor treatment for  MRSA infections.  URINE CULTURE     Status: None   Collection Time    07/25/12  2:08 PM      Result Value Range Status   Specimen Description URINE, CATHETERIZED   Final   Special Requests NONE   Final   Culture  Setup Time 07/25/2012 20:19   Final   Colony Count NO GROWTH   Final   Culture NO GROWTH   Final   Report Status 07/26/2012 FINAL   Final     Studies: No results found.  Scheduled Meds: . aspirin EC  81 mg Oral QODAY  . budesonide-formoterol  2 puff Inhalation BID  . finasteride  5 mg Oral q morning - 10a  . insulin aspart  0-9 Units Subcutaneous TID WC  . metoprolol tartrate  50 mg Oral BID  . metronidazole  500 mg Intravenous Q8H  . potassium phosphate IVPB (mmol)  30 mmol Intravenous Once  . sodium chloride  3 mL Intravenous Q12H  . Tamsulosin HCl  0.4 mg Oral q morning - 10a  . Ticagrelor  90 mg Oral BID  . vancomycin  125 mg Oral Q6H   Continuous Infusions: . heparin 2,100 Units/hr (07/27/12 1610)       Time spent: 35 minutes    Markeria Goetsch  [facility] Pager [phone]. If 8PM-8AM, please contact night-coverage at [email or web address], password [facility] 07/27/2012, 8:30 AM  LOS: 3 days

## 2012-07-27 NOTE — Progress Notes (Signed)
eLink Physician-Brief Progress Note Patient Name: Brandon Clay DOB: [birth year] MRN: [id number]  Date of Service  07/27/2012   HPI/Events of Note  Hypokalemia and hypophosphatemia   eICU Interventions  Potassium and phos replaced   Intervention Category Intermediate Interventions: Electrolyte abnormality - evaluation and management  DETERDING,ELIZABETH 07/27/2012, 5:12 AM

## 2012-07-28 LAB — BASIC METABOLIC PANEL
BUN: 9 mg/dL (ref 6–23)
CO2: 19 mEq/L (ref 19–32)
Calcium: 7.4 mg/dL — ABNORMAL LOW (ref 8.4–10.5)
Creatinine, Ser: 0.93 mg/dL (ref 0.50–1.35)

## 2012-07-28 LAB — GLUCOSE, CAPILLARY: Glucose-Capillary: 204 mg/dL — ABNORMAL HIGH (ref 70–99)

## 2012-07-28 LAB — CBC
MCHC: 33.8 g/dL (ref 30.0–36.0)
MCV: 82.2 fL (ref 78.0–100.0)
Platelets: 129 10*3/uL — ABNORMAL LOW (ref 150–400)
RDW: 17.1 % — ABNORMAL HIGH (ref 11.5–15.5)
WBC: 7.5 10*3/uL (ref 4.0–10.5)

## 2012-07-28 LAB — HEPARIN LEVEL (UNFRACTIONATED): Heparin Unfractionated: 0.35 IU/mL (ref 0.30–0.70)

## 2012-07-28 LAB — PROTIME-INR: Prothrombin Time: 16.1 seconds — ABNORMAL HIGH (ref 11.6–15.2)

## 2012-07-28 LAB — OVA AND PARASITE EXAMINATION: Ova and parasites: NONE SEEN

## 2012-07-28 LAB — PHOSPHORUS: Phosphorus: 2.1 mg/dL — ABNORMAL LOW (ref 2.3–4.6)

## 2012-07-28 MED ORDER — POTASSIUM CHLORIDE 20 MEQ/15ML (10%) PO LIQD
40.0000 meq | Freq: Every day | ORAL | Status: DC
  Administered 2012-07-30: 40 meq via ORAL
  Filled 2012-07-28 (×4): qty 30

## 2012-07-28 MED ORDER — WARFARIN SODIUM 5 MG PO TABS
5.0000 mg | ORAL_TABLET | Freq: Once | ORAL | Status: AC
  Administered 2012-07-28: 5 mg via ORAL
  Filled 2012-07-28: qty 1

## 2012-07-28 MED ORDER — DILTIAZEM HCL 100 MG IV SOLR
5.0000 mg/h | INTRAVENOUS | Status: DC
  Administered 2012-07-28 (×2): 5 mg/h via INTRAVENOUS
  Filled 2012-07-28: qty 100

## 2012-07-28 MED ORDER — PATIENT'S GUIDE TO USING COUMADIN BOOK
Freq: Once | Status: AC
  Administered 2012-07-28: 15:00:00
  Filled 2012-07-28: qty 1

## 2012-07-28 MED ORDER — WARFARIN VIDEO
Freq: Once | Status: AC
  Administered 2012-07-28: 15:00:00

## 2012-07-28 MED ORDER — POTASSIUM CHLORIDE CRYS ER 20 MEQ PO TBCR
30.0000 meq | EXTENDED_RELEASE_TABLET | Freq: Once | ORAL | Status: AC
  Administered 2012-07-28: 30 meq via ORAL
  Filled 2012-07-28 (×2): qty 1

## 2012-07-28 NOTE — Progress Notes (Signed)
[id number] Brandon Plater, RN, BSN, CCM:  CHART REVIEWED AND UPDATED.  Next chart review due on [id number]. NO DISCHARGE NEEDS PRESENT AT THIS TIME. CASE MANAGEMENT [phone]

## 2012-07-28 NOTE — Progress Notes (Signed)
SUBJECTIVE: Feeling better this am. No chest pain or SOB.   BP 146/94  Pulse 107  Temp(Src) 97.5 F (36.4 C) (Oral)  Resp 17  Ht 5' 10.08" (1.78 m)  Wt 206 lb 9.1 oz (93.7 kg)  BMI 29.57 kg/m2  SpO2 95%  Intake/Output Summary (Last 24 hours) at 07/28/12 1478 Last data filed at 07/28/12 0700  Gross per 24 hour  Intake   1880 ml  Output   1650 ml  Net    230 ml    PHYSICAL EXAM General: Well developed, well nourished, in no acute distress. Alert and oriented x 3.  Psych:  Good affect, responds appropriately Neck: No JVD. No masses noted.  Lungs: Clear bilaterally with no wheezes or rhonci noted.  Heart: irreg irreg   Abdomen: Bowel sounds are present. Soft, non-tender.  Extremities: Trace bilateral lower extremity edema.   LABS: Basic Metabolic Panel:  Recent Labs  29/56/21 0342 07/28/12 0255  NA 133* 134*  K 3.1* 3.0*  CL 102 103  CO2 18* 19  GLUCOSE 172* 175*  BUN 11 9  CREATININE 0.98 0.93  CALCIUM 7.1* 7.4*  MG 1.8 1.7  PHOS 1.9* 2.1*   CBC:  Recent Labs  07/27/12 0342 07/28/12 0255  WBC 9.5 7.5  HGB 10.1* 9.7*  HCT 29.6* 28.7*  MCV 83.9 82.2  PLT 131* 129*   Cardiac Enzymes:  Recent Labs  07/25/12 0930  TROPONINI <0.30    Current Meds: . aspirin EC  81 mg Oral QODAY  . budesonide-formoterol  2 puff Inhalation BID  . finasteride  5 mg Oral q morning - 10a  . insulin aspart  0-9 Units Subcutaneous TID WC  . metoprolol tartrate  50 mg Oral BID  . metronidazole  500 mg Intravenous Q8H  . potassium chloride  30 mEq Oral Once  . potassium chloride  40 mEq Oral Daily  . sodium chloride  3 mL Intravenous Q12H  . Tamsulosin HCl  0.4 mg Oral q morning - 10a  . Ticagrelor  90 mg Oral BID  . vancomycin  125 mg Oral Q6H  . warfarin  5 mg Oral ONCE-1800  . Warfarin - Pharmacist Dosing Inpatient   Does not apply q1800     ASSESSMENT AND PLAN: 78 yo male with history of CAD with bare metal stent placement Circumflex artery 07/12/12 on ASA  and Brilinta (plan for one month ending 08/09/12), newly diagnosed squamous cell lung cancer (awaiting lobectomy in March 2014 per Dr. [name]) admitted with C. Diff colitis, dehydration, atrial fibrillation with RVR.   1. New onset atrial fibrillation: In setting of dehydration/sepsis with C. Diff colitis. Rates not well controlled this am. Will start Cardizem drip.  Continue metoprolol 50 mg po BID today. Continue heparin drip for now. Will arrange TEE guided cardioversion for tomorrow at [facility]. Long term anti-coagulation with be tricky since he needs to have lobectomy for lung cancer. The best plan will likely be a one month course of coumadin following cardioversion which can be held the week of lobectomy (presumably the lobectomy can wait for another four weeks). Coumadin started yesterday.  (Will not use newer anticoagulant such as Xarelto or Pradaxa since he is on Brilinta for anti-platelet therapy with recent coronary stent). Will d/c ASA when INR is therapeutic and he will be treated with Brilinta and coumadin. NPO at midnight tonight.   2. Coronary artery disease: recent bare-metal stent placement Circumflex artery 07/12/12. Continue ASA and Brilinta.  3. C. Diff colitis with significant diarrhea: Improving. Treatment per primary team.   4. Hyperlipidemia with statin intolerance   5. History of squamous cell lung cancer: Will need lobectomy in March when colitis cleared. He has seen Dr. [name].   6. Hypokalemia: Replace this am.     MCALHANY,CHRISTOPHER  2/19/20147:28 AM

## 2012-07-28 NOTE — Progress Notes (Signed)
ANTICOAGULATION CONSULT NOTE - Follow Up Consult  Pharmacy Consult for IV Heparin, Warfarin Indication: atrial fibrillation  No Known Allergies  Patient Measurements: Height: 5' 10.08" (178 cm) Weight: 206 lb 9.1 oz (93.7 kg) IBW/kg (Calculated) : 73.18 Heparin Dosing Weight: 81.4 kg  Vital Signs: Temp: 97.5 F (36.4 C) (02/19 0400) Temp src: Oral (02/19 0400) BP: 146/94 mmHg (02/19 0700) Pulse Rate: 107 (02/19 0700)  Labs:  Recent Labs  07/25/12 0930 07/25/12 1014  07/26/12 0330  07/27/12 0342 07/27/12 0930 07/27/12 1825 07/28/12 0255  HGB  --   --   < > 9.7*  --  10.1*  --   --  9.7*  HCT  --   --   --  29.3*  --  29.6*  --   --  28.7*  PLT  --   --   --  134*  --  131*  --   --  129*  APTT  --  34  --   --   --   --   --   --   --   LABPROT  --  14.3  --   --   --   --   --   --  16.1*  INR  --  1.13  --   --   --   --   --   --  1.32  HEPARINUNFRC  --   --   < > <0.10*  < >  --  0.32 0.35 0.35  CREATININE  --   --   --  1.08  --  0.98  --   --  0.93  TROPONINI <0.30  --   --   --   --   --   --   --   --   < > = values in this interval not displayed.  Estimated Creatinine Clearance: 79 ml/min (by C-G formula based on Cr of 0.93).  Medications:  Infusions:  . heparin 2,100 Units/hr (07/28/12 1610)  . [DISCONTINUED] sodium chloride 20 mL/hr at 07/27/12 9604   Assessment:  77 yom with complex cardiac h/o (including afib) and recent dx lung cancer presented 2/15 with afib with RVR. IV heparin started 2/16, Warfarin started 2/18.   Heparin level in therapeutic range again today.  INR up slightly after 5mg  x 1. Continuing Brilinta and Aspirin as PTA for recent bare metal stent.  CBC ok. No bleeding reported/documented.  Tolerating diet.  Goal of Therapy:  INR 2-3 Heparin level 0.3-0.7 units/ml Monitor platelets by anticoagulation protocol: Yes   Plan:   Continue heparin IV infusion at 2100 units/hr.  Repeat warfarin 5mg  today.  Daily heparin  level, INR, and CBC.  Continue to monitor H&H and platelets.  Charolotte Eke, PharmD, pager [phone]. 07/28/2012,7:14 AM.

## 2012-07-28 NOTE — Progress Notes (Addendum)
[facility] PROGRESS NOTE  Brandon Clay:[id number] DOB: 26-Dec-1934 DOA: 07/24/2012 PCP: [name], MD  Brief narrative: Pt is 78 yo male with multiple and complex medical history including CAD, 3 vessels w/ chronic occlusion of RCA, severe stenosis prox LCx & moderately severe dz in mid LAD, S/P PTCA/BMS to prox LCx, DAPT with ASA and Brilinta x1 month (Plavix nonresponder), LUL squamous lung cancer and with planned lobectomy by Dr. [name] after one month of DAPT, presented to to [facility] ED 07/24/2012 with main concern of persistent and ongoing  diarrhea of 2 days in durations, associated with crampy abdominal pain in the epigastric area, one episode of non bloody vomiting, subjective fevers, chills, poor oral intake. Patient was subsequently found to have C. difficile colitis. In ED, pt found to be in atrial fibrillation. Per cardiology patient still requires Cardizem drip and the plan is for TEE cardioversion tomorrow 07/29/2012.  Assessment and Plan:   Principal Problem:  Diarrhea, vomiting, abdominal pain  Secondary to sepsis secondary to C. difficile colitis Continue Flagyl and vancomycin Active Problems:  Atrial fibrillation with RVR  On Cardizem drip per cardiology Continue metoprolol 50 mg twice a day Coumadin per pharmacy; heparin infusion per pharmacy Replete electrolytes as needed, potassium 40 mEq by mouth daily Continue Brilinta Appreciate cardiology following Plan for TEE cardioversion 07/29/2012 Leukocytosis  Likely secondary to C. difficile colitis White blood cell count now within normal limits Hypokalemia  Secondary to GI losses, diarrhea Potassium chloride 40 mEq daily Hypertension  Continue Cardizem drip and metoprolol 50 mg by mouth twice a day Hypercholesterolemia  statin intolerant  Diabetes, type II and with complications  Hemoglobin A1c is 7.4 on this admission Metformin held secondary to C. difficile colitis Continue sliding scale  insulin Squamous cell lung cancer left upper lobe  scheduled for planned lobectomy by Dr. [name] in 1 month after aspirin and DAPT COPD (chronic obstructive pulmonary disease)  this appears to be clinically stable  continue Symbicort  CAD (coronary artery disease), native coronary artery  pt currently hemodynamically stable  continue aspirin and Brilinta  Anemia of chronic disease  stable Hgb and Hct and at pt's baseline Hemoglobin of 9.7 today Thrombocytopenia  Likely secondary to heparin  Platelet count 129 today. Continue to monitor. Benign prostatic hypertrophy  continue Finasteride and Flomax   Code Status: Full code Family Communication: No family at bedside Disposition Plan: Pending  Consultants:   cardiology Procedures:  Plan for cardioversion 07/29/2012 Antibiotics:  Flagyl (2/15) --> Oral vancomycin (2/16) -->   [name], MD  [facility] Pager [phone]  If 7PM-7AM, please contact night-coverage [email or web address] Password [facility] 07/28/2012, 12:16 PM   LOS: 4 days   HPI/Subjective: No acute overnight events.  Objective: Filed Vitals:   07/28/12 1000 07/28/12 1020 07/28/12 1100 07/28/12 1200  BP:  131/71    Pulse: 124 113 84   Temp:    97.7 F (36.5 C)  TempSrc:    Oral  Resp: 21 22 19    Height:      Weight:      SpO2: 93% 95% 96%     Intake/Output Summary (Last 24 hours) at 07/28/12 1216 Last data filed at 07/28/12 1200  Gross per 24 hour  Intake   1274 ml  Output   1325 ml  Net    -51 ml    Exam:   General:  Pt is alert, follows commands appropriately, not in acute distress  Cardiovascular: Irregular rhythm, tachycardic, S1, S2 appreciated  Respiratory: Clear  to auscultation bilaterally, no wheezing, no crackles, no rhonchi  Abdomen: Soft, non tender, non distended, bowel sounds present, no guarding  Extremities: Trace lower extremity pitting edema, pulses DP and PT palpable bilaterally  Neuro: Grossly nonfocal  Data  Reviewed: Basic Metabolic Panel:  Recent Labs Lab 07/24/12 1800 07/24/12 2140 07/25/12 0308 07/26/12 0330 07/27/12 0342 07/28/12 0255  NA 135  --  132* 132* 133* 134*  K 3.3*  --  3.4* 3.1* 3.1* 3.0*  CL 101  --  97 105 102 103  CO2 19  --  21 17* 18* 19  GLUCOSE 187*  --  193* 161* 172* 175*  BUN 16  --  16 14 11 9   CREATININE 1.04  --  1.15 1.08 0.98 0.93  CALCIUM 8.0*  --  8.1* 7.2* 7.1* 7.4*  MG  --  1.2*  --  1.5 1.8 1.7  PHOS  --  2.8  --   --  1.9* 2.1*   Liver Function Tests:  Recent Labs Lab 07/24/12 1800  AST 9  ALT 10  ALKPHOS 46  BILITOT 0.4  PROT 5.8*  ALBUMIN 2.4*   CBC:  Recent Labs Lab 07/24/12 1800 07/25/12 0308 07/26/12 0330 07/27/12 0342 07/28/12 0255  WBC 16.5* 16.6* 12.0* 9.5 7.5  NEUTROABS 14.1*  --   --   --   --   HGB 10.6* 10.6* 9.7* 10.1* 9.7*  HCT 32.1* 31.8* 29.3* 29.6* 28.7*  MCV 84.9 84.6 84.7 83.9 82.2  PLT 151 167 134* 131* 129*   Cardiac Enzymes:  Recent Labs Lab 07/24/12 1800 07/24/12 2140 07/25/12 0308 07/25/12 0930  TROPONINI <0.30 <0.30 <0.30 <0.30   CBG:  Recent Labs Lab 07/27/12 0751 07/27/12 1159 07/27/12 1644 07/28/12 0735 07/28/12 1151  GLUCAP 178* 217* 220* 167* 204*    CLOSTRIDIUM DIFFICILE BY PCR     Status: Abnormal   Collection Time    07/24/12 11:53 PM      Result Value Range Status   C difficile by pcr POSITIVE (*) NEGATIVE Final   Comment: CRITICAL RESULT CALLED TO, READ BACK BY AND VERIFIED WITH:     ARNOLD,A RN 07/25/12 1124 WOOTEN,K  MRSA PCR SCREENING     Status: None   Collection Time    07/25/12  2:10 AM      Result Value Range Status   MRSA by PCR NEGATIVE  NEGATIVE Final  URINE CULTURE     Status: None   Collection Time    07/25/12  2:08 PM      Result Value Range Status   Specimen Description URINE, CATHETERIZED   Final   Special Requests NONE   Final   Culture  Setup Time 07/25/2012 20:19   Final   Colony Count NO GROWTH   Final   Culture NO GROWTH   Final   Report  Status 07/26/2012 FINAL   Final     Studies: No results found.  Scheduled Meds: . aspirin EC  81 mg Oral QODAY  . budesonide-formoterol  2 puff Inhalation BID  . finasteride  5 mg Oral q morning - 10a  . insulin aspart  0-9 Units Subcutaneous TID WC  . metoprolol tartrate  50 mg Oral BID  . metronidazole  500 mg Intravenous Q8H  . potassium chloride  40 mEq Oral Daily  . sodium chloride  3 mL Intravenous Q12H  . Tamsulosin HCl  0.4 mg Oral q morning - 10a  . Ticagrelor  90 mg Oral BID  .  vancomycin  125 mg Oral Q6H  . warfarin  5 mg Oral ONCE-1800   Continuous Infusions: . diltiazem (CARDIZEM) infusion 5 mg/hr (07/28/12 0834)  . heparin 2,100 Units/hr (07/28/12 1610)

## 2012-07-28 NOTE — Progress Notes (Signed)
PT Cancellation Note  Patient Details Name: Brandon Clay MRN: [id number] DOB: 1934/07/07   Cancelled Treatment:    Reason Eval/Treat Not Completed: Medical issues which prohibited therapy ( in afib,rate not controlled, for possible cardioversion 2/20)   Rada Hay 07/28/2012, 7:52 AM [phone]

## 2012-07-29 ENCOUNTER — Encounter (HOSPITAL_COMMUNITY): Payer: Self-pay | Admitting: Anesthesiology

## 2012-07-29 ENCOUNTER — Encounter (HOSPITAL_COMMUNITY): Payer: Self-pay

## 2012-07-29 ENCOUNTER — Encounter (HOSPITAL_COMMUNITY)
Admission: EM | Disposition: A | Discharge status: Home / Self Care | Payer: Self-pay | Source: Home / Self Care | Attending: Internal Medicine

## 2012-07-29 ENCOUNTER — Inpatient Hospital Stay (HOSPITAL_COMMUNITY): Payer: Medicare Other | Admitting: Anesthesiology

## 2012-07-29 DIAGNOSIS — I4891 Unspecified atrial fibrillation: Secondary | ICD-10-CM

## 2012-07-29 HISTORY — PX: TEE WITHOUT CARDIOVERSION: SHX5443

## 2012-07-29 HISTORY — PX: CARDIOVERSION: SHX1299

## 2012-07-29 LAB — GLUCOSE, CAPILLARY
Glucose-Capillary: 176 mg/dL — ABNORMAL HIGH (ref 70–99)
Glucose-Capillary: 212 mg/dL — ABNORMAL HIGH (ref 70–99)

## 2012-07-29 LAB — CBC
HCT: 27.1 % — ABNORMAL LOW (ref 39.0–52.0)
Hemoglobin: 9.2 g/dL — ABNORMAL LOW (ref 13.0–17.0)
MCH: 28.3 pg (ref 26.0–34.0)
MCHC: 33.9 g/dL (ref 30.0–36.0)

## 2012-07-29 LAB — PROTIME-INR: INR: 1.44 (ref 0.00–1.49)

## 2012-07-29 LAB — HEPARIN LEVEL (UNFRACTIONATED): Heparin Unfractionated: 0.49 IU/mL (ref 0.30–0.70)

## 2012-07-29 LAB — BASIC METABOLIC PANEL
CO2: 18 mEq/L — ABNORMAL LOW (ref 19–32)
Chloride: 107 mEq/L (ref 96–112)
Sodium: 136 mEq/L (ref 135–145)

## 2012-07-29 SURGERY — ECHOCARDIOGRAM, TRANSESOPHAGEAL
Anesthesia: Moderate Sedation

## 2012-07-29 MED ORDER — MIDAZOLAM HCL 10 MG/2ML IJ SOLN
INTRAMUSCULAR | Status: DC | PRN
  Administered 2012-07-29: 1 mg via INTRAVENOUS

## 2012-07-29 MED ORDER — SODIUM CHLORIDE 0.9 % IV SOLN
INTRAVENOUS | Status: DC | PRN
  Administered 2012-07-29: 10:00:00 via INTRAVENOUS

## 2012-07-29 MED ORDER — WARFARIN SODIUM 5 MG PO TABS
5.0000 mg | ORAL_TABLET | Freq: Once | ORAL | Status: AC
  Administered 2012-07-29: 5 mg via ORAL
  Filled 2012-07-29 (×2): qty 1

## 2012-07-29 MED ORDER — DILTIAZEM HCL 100 MG IV SOLR
5.0000 mg/h | INTRAVENOUS | Status: DC
  Administered 2012-07-29: 10 mg/h via INTRAVENOUS
  Administered 2012-07-30: 15 mg/h via INTRAVENOUS
  Administered 2012-07-30 (×2): 10 mg/h via INTRAVENOUS
  Filled 2012-07-29 (×6): qty 100

## 2012-07-29 MED ORDER — METOPROLOL TARTRATE 50 MG PO TABS
50.0000 mg | ORAL_TABLET | Freq: Once | ORAL | Status: AC
  Administered 2012-07-29: 50 mg via ORAL
  Filled 2012-07-29: qty 1

## 2012-07-29 MED ORDER — METOPROLOL TARTRATE 100 MG PO TABS
100.0000 mg | ORAL_TABLET | Freq: Two times a day (BID) | ORAL | Status: DC
  Administered 2012-07-30 – 2012-08-03 (×9): 100 mg via ORAL
  Filled 2012-07-29 (×10): qty 1

## 2012-07-29 MED ORDER — FENTANYL CITRATE 0.05 MG/ML IJ SOLN
INTRAMUSCULAR | Status: DC | PRN
  Administered 2012-07-29: 25 ug via INTRAVENOUS

## 2012-07-29 MED ORDER — DIPHENHYDRAMINE HCL 50 MG/ML IJ SOLN
INTRAMUSCULAR | Status: AC
  Filled 2012-07-29: qty 1

## 2012-07-29 MED ORDER — FENTANYL CITRATE 0.05 MG/ML IJ SOLN
INTRAMUSCULAR | Status: AC
  Filled 2012-07-29: qty 4

## 2012-07-29 MED ORDER — DILTIAZEM HCL ER COATED BEADS 180 MG PO CP24: 180.0000 mg | ORAL_CAPSULE | Freq: Every day | ORAL | Status: DC

## 2012-07-29 MED ORDER — BUTAMBEN-TETRACAINE-BENZOCAINE 2-2-14 % EX AERO
INHALATION_SPRAY | CUTANEOUS | Status: DC | PRN
  Administered 2012-07-29: 2 via TOPICAL

## 2012-07-29 MED ORDER — MIDAZOLAM HCL 5 MG/ML IJ SOLN
INTRAMUSCULAR | Status: AC
  Filled 2012-07-29: qty 2

## 2012-07-29 MED ORDER — LIDOCAINE HCL (CARDIAC) 20 MG/ML IV SOLN
INTRAVENOUS | Status: DC | PRN
  Administered 2012-07-29: 30 mg via INTRAVENOUS

## 2012-07-29 MED ORDER — PROPOFOL 10 MG/ML IV BOLUS
INTRAVENOUS | Status: DC | PRN
  Administered 2012-07-29: 30 mg via INTRAVENOUS

## 2012-07-29 MED ORDER — SODIUM CHLORIDE 0.9 % IV SOLN
INTRAVENOUS | Status: DC
  Administered 2012-07-29: 11:00:00 via INTRAVENOUS

## 2012-07-29 MED ORDER — SODIUM CHLORIDE 0.9 % IV SOLN: INTRAVENOUS | Status: DC | PRN

## 2012-07-29 NOTE — Progress Notes (Signed)
Called to see pt re: afib. Pt had TEE/DCCV today and was in SR for a short time but went back into atrial fib, RVR most of the time. He was restarted on Cardizem IV at 5mg /hr but his heart rate is still > 100 most of the time. He currently denies chest pain, increased SOB, dizziness, weakness or presyncope. He is maintaining a normal/high BP.  Called Dr Clifton James, he feels the best option is to have EP see him and advise further rhythm management. With his lung CA and CAD, options are limited. With the need for LUL lobectomy after completing DAPT, he will benefit from maintaining SR, if possible.   Contacted Dr Verlon Setting, she will transfer to stepdown at [facility] and coordinate with [facility] team to manage his care. Will restart IV Cardizem for rate control and continue heparin/coumadin. Will contact EP and have them see him.  Otherwise, per primary MD.  Theodore Demark, PA 07/29/2012 12:07 PM

## 2012-07-29 NOTE — Anesthesia Postprocedure Evaluation (Signed)
  Anesthesia Post-op Note  Patient: Brandon Clay  Procedure(s) Performed: Procedure(s) with comments: TRANSESOPHAGEAL ECHOCARDIOGRAM (TEE) (N/A) - patient coming from [place] room 1236 talk to jennifer at [place]  talk to [facility] from care link will pick up pat. at 8:15 from [place] CARDIOVERSION (N/A)  Patient Location: PACU and Endoscopy Unit  Anesthesia Type:General  Level of Consciousness: awake, oriented, sedated and patient cooperative  Airway and Oxygen Therapy: Patient Spontanous Breathing  Post-op Pain: none  Post-op Assessment: Post-op Vital signs reviewed, Patient's Cardiovascular Status Stable, Respiratory Function Stable, Patent Airway, No signs of Nausea or vomiting and Pain level controlled  Post-op Vital Signs: stable  Complications: No apparent anesthesia complications

## 2012-07-29 NOTE — Anesthesia Postprocedure Evaluation (Signed)
  Anesthesia Post-op Note  Patient: Brandon Clay  Procedure(s) Performed: Procedure(s) with comments: TRANSESOPHAGEAL ECHOCARDIOGRAM (TEE) (N/A) - patient coming from [place] room 1236 talk to jennifer at [place]  talk to [facility] from care link will pick up pat. at 8:15 from [place] CARDIOVERSION (N/A)  Patient Location: PACU and Endoscopy Unit  Anesthesia Type:General  Level of Consciousness: awake, oriented and patient cooperative  Airway and Oxygen Therapy: Patient Spontanous Breathing  Post-op Pain: none  Post-op Assessment: Post-op Vital signs reviewed, Patient's Cardiovascular Status Stable, Respiratory Function Stable, Patent Airway, No signs of Nausea or vomiting and Pain level controlled  Post-op Vital Signs: stable  Complications: No apparent anesthesia complications

## 2012-07-29 NOTE — Preoperative (Signed)
Beta Blockers   Reason not to administer Beta Blockers:Coreg 2136 07/28/2012

## 2012-07-29 NOTE — Progress Notes (Signed)
  Echocardiogram 2D Echocardiogram has been performed.  Brandon Clay 07/29/2012, 11:00 AM

## 2012-07-29 NOTE — OR Nursing (Addendum)
1120 Patient reverted back to atrial fib.  Dr. Shirlee Latch notified.  Patient is to return to [facility] since PO liquids were taken by patient.  Patient is to remain on IV cardizam. Patient's nurse Nicholes Calamity was notified of the Atrial fib and IV diltiazem infusion.

## 2012-07-29 NOTE — Transfer of Care (Signed)
Immediate Anesthesia Transfer of Care Note  Patient: Brandon Clay  Procedure(s) Performed: Procedure(s) with comments: TRANSESOPHAGEAL ECHOCARDIOGRAM (TEE) (N/A) - patient coming from [place] long room 1236 talk to jennifer at [place]  talk to [facility] from care link will pick up pat. at 8:15 from  CARDIOVERSION (N/A)  Patient Location: PACU and Endoscopy Unit  Anesthesia Type:MAC  Level of Consciousness: awake, oriented and patient cooperative  Airway & Oxygen Therapy: Patient Spontanous Breathing and Patient connected to nasal cannula oxygen  Post-op Assessment: Report given to PACU RN, Post -op Vital signs reviewed and stable and Patient moving all extremities  Post vital signs: Reviewed and stable  Complications: No apparent anesthesia complications

## 2012-07-29 NOTE — Anesthesia Preprocedure Evaluation (Addendum)
Anesthesia Evaluation  Patient identified by MRN, date of birth, ID band Patient awake    Reviewed: Allergy & Precautions, H&P , NPO status , Patient's Chart, lab work & pertinent test results, reviewed documented beta blocker date and time   Airway Mallampati: I TM Distance: >3 FB Neck ROM: Full    Dental  (+) Edentulous Upper and Edentulous Lower   Pulmonary          Cardiovascular hypertension, Pt. on medications and Pt. on home beta blockers + CAD and + Peripheral Vascular Disease + dysrhythmias Atrial Fibrillation Rhythm:irregular Rate:Tachycardia     Neuro/Psych    GI/Hepatic   Endo/Other  diabetes, Well Controlled, Type 2, Oral Hypoglycemic Agents  Renal/GU      Musculoskeletal   Abdominal   Peds  Hematology   Anesthesia Other Findings   Reproductive/Obstetrics                         Anesthesia Physical Anesthesia Plan  ASA: III  Anesthesia Plan: General   Post-op Pain Management:    Induction: Intravenous  Airway Management Planned: Mask  Additional Equipment:   Intra-op Plan:   Post-operative Plan:   Informed Consent:   Plan Discussed with: CRNA, Anesthesiologist and Surgeon  Anesthesia Plan Comments:         Anesthesia Quick Evaluation

## 2012-07-29 NOTE — CV Procedure (Signed)
Procedure: TEE  Indication: atrial fibrillation with RVR  Sedation: Versed 1 mg IV, Fentanyl 25 mcg IV  Findings: Please see report in echo section for full details.  Normal LV size and systolic function, EF 55-60%.  No LA appendage thrombus.  Normal RV size and systolic function.  Trivial MR.  There is a PFO with bidirectional shunting.    Impression: May proceed to DCCV.   Marca Ancona 07/29/2012 10:33 AM

## 2012-07-29 NOTE — Progress Notes (Signed)
ANTICOAGULATION CONSULT NOTE - Follow Up Consult  Pharmacy Consult for IV Heparin, Warfarin Indication: atrial fibrillation  No Known Allergies  Patient Measurements: Height: 5' 10.08" (178 cm) Weight: 199 lb 4.7 oz (90.4 kg) IBW/kg (Calculated) : 73.18 Heparin Dosing Weight: 81.4 kg  Vital Signs: Temp: 98.1 F (36.7 C) (02/20 0400) Temp src: Oral (02/20 0400) BP: 130/86 mmHg (02/20 0600) Pulse Rate: 94 (02/20 0600)  Labs:  Recent Labs  07/27/12 0342  07/27/12 1825 07/28/12 0255 07/29/12 0340  HGB 10.1*  --   --  9.7* 9.2*  HCT 29.6*  --   --  28.7* 27.1*  PLT 131*  --   --  129* 134*  LABPROT  --   --   --  16.1* 17.2*  INR  --   --   --  1.32 1.44  HEPARINUNFRC  --   < > 0.35 0.35 0.27*  CREATININE 0.98  --   --  0.93 0.92  < > = values in this interval not displayed.  Estimated Creatinine Clearance: 78.6 ml/min (by C-G formula based on Cr of 0.92).  Medications:  Infusions:  . diltiazem (CARDIZEM) infusion 5 mg/hr (07/28/12 1728)  . heparin 2,100 Units/hr (07/29/12 1610)   Assessment:  77 yom with complex cardiac h/o (including afib) and recent dx lung cancer presented 2/15 with afib with RVR. IV heparin started 2/16, Warfarin started 2/18.   Heparin level slightly below therapeutic range this am.  INR rising after 5mg  x 2. Continuing Brilinta and aspirin as PTA for recent bare metal stent with plans to stop aspirin once INR is 2 or greater. Planning cardioversion today at [facility].  CBC ok. No bleeding reported/documented.  Tolerating diet.  Goal of Therapy:  INR 2-3 Heparin level 0.3-0.7 units/ml Monitor platelets by anticoagulation protocol: Yes   Plan:   Increase heparin IV infusion to 2350 units/hr.  Recheck heparin level in 6hrs.  Repeat warfarin 5mg  today.  Daily heparin level, INR, and CBC.  Continue to monitor H&H and platelets.  Charolotte Eke, PharmD, pager [phone]. 07/29/2012,7:35 AM.

## 2012-07-29 NOTE — Progress Notes (Signed)
ANTICOAGULATION CONSULT NOTE - Follow Up Consult  Pharmacy Consult for IV Heparin, Warfarin Indication: atrial fibrillation  No Known Allergies  Patient Measurements: Height: 5\' 10"  (177.8 cm) Weight: 200 lb 9.9 oz (91 kg) IBW/kg (Calculated) : 73 Heparin Dosing Weight: 81.4 kg  Vital Signs: Temp: 97.9 F (36.6 C) (02/20 1301) Temp src: Oral (02/20 1301) BP: 144/110 mmHg (02/20 1301) Pulse Rate: 105 (02/20 1301)  Labs:  Recent Labs  07/27/12 0342  07/28/12 0255 07/29/12 0340 07/29/12 1426  HGB 10.1*  --  9.7* 9.2*  --   HCT 29.6*  --  28.7* 27.1*  --   PLT 131*  --  129* 134*  --   LABPROT  --   --  16.1* 17.2*  --   INR  --   --  1.32 1.44  --   HEPARINUNFRC  --   < > 0.35 0.27* 0.49  CREATININE 0.98  --  0.93 0.92  --   < > = values in this interval not displayed.  Estimated Creatinine Clearance: 78.7 ml/min (by C-G formula based on Cr of 0.92).  Medications:  Infusions:  . diltiazem (CARDIZEM) infusion 10 mg/hr (07/29/12 1425)  . heparin 2,350 Units/hr (07/29/12 1300)  . [DISCONTINUED] sodium chloride 10 mL/hr at 07/29/12 1041  . [DISCONTINUED] diltiazem (CARDIZEM) infusion 5 mg/hr (07/28/12 1728)   Assessment:  77 yom with complex cardiac h/o (including afib) and recent dx lung cancer presented 2/15 with afib with RVR. IV heparin started 2/16, Warfarin started 2/18.   Heparin level therapeutic at 0.49  Goal of Therapy:  INR 2-3 Heparin level 0.3-0.7 units/ml Monitor platelets by anticoagulation protocol: Yes   Plan:   Continue heparin IV infusion to 2350 units/hr.  Recheck heparin level in AM  Repeat warfarin 5mg  today.  Daily heparin level, INR, and CBC.  Continue to monitor H&H and platelets.  Celedonio Miyamoto, PharmD, BCPS Clinical Pharmacist Pager [phone]  07/29/2012,4:07 PM.

## 2012-07-29 NOTE — Progress Notes (Signed)
SUBJECTIVE: Feeling much better this am. NO chest pain or SOB. Only 2 loose stools yesterday.   BP 130/86  Pulse 94  Temp(Src) 98.1 F (36.7 C) (Oral)  Resp 22  Ht 5' 10.08" (1.78 m)  Wt 199 lb 4.7 oz (90.4 kg)  BMI 28.53 kg/m2  SpO2 96%  Intake/Output Summary (Last 24 hours) at 07/29/12 9562 Last data filed at 07/29/12 1308  Gross per 24 hour  Intake    898 ml  Output   1026 ml  Net   -128 ml    PHYSICAL EXAM General: Well developed, well nourished, in no acute distress. Alert and oriented x 3.  Psych:  Good affect, responds appropriately Neck: No JVD. No masses noted.  Lungs: Clear bilaterally with no wheezes or rhonci noted.  Heart: Irreg irreg Abdomen: Bowel sounds are present. Soft, non-tender.  Extremities: No lower extremity edema.   LABS: Basic Metabolic Panel:  Recent Labs  65/78/46 0255 07/29/12 0340  NA 134* 136  K 3.0* 3.6  CL 103 107  CO2 19 18*  GLUCOSE 175* 208*  BUN 9 8  CREATININE 0.93 0.92  CALCIUM 7.4* 7.6*  MG 1.7 1.6  PHOS 2.1* 1.9*   CBC:  Recent Labs  07/28/12 0255 07/29/12 0340  WBC 7.5 5.2  HGB 9.7* 9.2*  HCT 28.7* 27.1*  MCV 82.2 83.4  PLT 129* 134*   Current Meds: . aspirin EC  81 mg Oral QODAY  . budesonide-formoterol  2 puff Inhalation BID  . finasteride  5 mg Oral q morning - 10a  . insulin aspart  0-9 Units Subcutaneous TID WC  . metoprolol tartrate  50 mg Oral BID  . metronidazole  500 mg Intravenous Q8H  . potassium chloride  40 mEq Oral Daily  . sodium chloride  3 mL Intravenous Q12H  . Tamsulosin HCl  0.4 mg Oral q morning - 10a  . Ticagrelor  90 mg Oral BID  . vancomycin  125 mg Oral Q6H  . Warfarin - Pharmacist Dosing Inpatient   Does not apply q1800     ASSESSMENT AND PLAN: 78 yo male with history of CAD with bare metal stent placement Circumflex artery 07/12/12 on ASA and Brilinta (plan for one month ending 08/09/12), newly diagnosed squamous cell lung cancer (awaiting lobectomy in March 2014 per Dr.  [name]) admitted with C. Diff colitis, dehydration, atrial fibrillation with RVR.   1. New onset atrial fibrillation: In setting of dehydration/sepsis with C. Diff colitis. Rates better controlled this am on Cardizem drip. Continue metoprolol 50 mg po BID today. Continue heparin drip for now. Will arrange TEE guided cardioversion this am at [facility]. Long term anti-coagulation with be tricky since he needs to have lobectomy for lung cancer. The best plan will likely be a one month course of coumadin following cardioversion which can be held the week of lobectomy (presumably the lobectomy can wait for another four weeks). Coumadin started 07/27/12. (Will not use newer anticoagulant such as Xarelto or Pradaxa since he is on Brilinta for anti-platelet therapy with recent coronary stent). Will d/c ASA when INR is therapeutic and he will be treated with Brilinta and coumadin. NPO this am. Anticipate he will be ready for d/c home in 2 days from heart standpoint. Will need therapeutic INR before d/c home.   2. Coronary artery disease: recent bare-metal stent placement Circumflex artery 07/12/12. Continue ASA and Brilinta.   3. C. Diff colitis with significant diarrhea: Improving. Treatment per primary team.  4. Hyperlipidemia with statin intolerance   5. History of squamous cell lung cancer: Will need lobectomy in March when colitis cleared. He has seen Dr. [name].   6. Hypokalemia: Replace this am.   7. Urinary retention: He has had issues last two days with retention and has required in/out cath and now foley. May need to get urology consult to address.       Yohannes Waibel  2/20/20146:42 AM

## 2012-07-29 NOTE — Consult Note (Signed)
ELECTROPHYSIOLOGY CONSULT NOTE     Primary Care Physician: [name], MD Referring Physician:  Dr Clifton Detrell Umscheid  Admit Date: 07/24/2012  Reason for consultation:  afib  Brandon Clay is a 78 y.o. male with a h/o persistent atrial fibrillation for which EP is consulted.  The patient has had a prolonged difficulty with medical illness over the past two months.  He has recently been diagnosed with lung CA and requires lobectomy soon.  He has recently been diagnosed with CAD and is s/p PCI.  He most recently has developed CDiff.  His recent hospital coarse has been complicated by afib with RVR. Presently, he is comfortable.  Today, he denies symptoms of palpitations, chest pain, shortness of breath, orthopnea, PND, dizziness, presyncope, syncope, or neurologic sequela. The patient is tolerating medications without difficulties and is otherwise without complaint today.   Past Medical History  Diagnosis Date  . Hypertension   . Hypercholesterolemia     statin intolerant  . Diabetes mellitus, type 2   . Benign prostatic hypertrophy   . Emphysema   . Lung cancer     Followed by Dr. [name] (LUL squamous cell carcinoma)  . COPD (chronic obstructive pulmonary disease)   . CKD (chronic kidney disease)   . Colitis   . Coronary artery disease     Cath 07/02/12 3v CAD w/ chronic occlusion of RCA (fills from left collaterals), severe stenosis prox LCx & moderately severe dz in mid LAD; s/p PTCA/BMS to prox LCx 07/12/12   . Normocytic anemia   . Persistent atrial fibrillation   . Atrial enlargement, left    Past Surgical History  Procedure Laterality Date  . Hemorrhoid surgery      . aspirin EC  81 mg Oral QODAY  . budesonide-formoterol  2 puff Inhalation BID  . finasteride  5 mg Oral q morning - 10a  . insulin aspart  0-9 Units Subcutaneous TID WC  . metoprolol tartrate  50 mg Oral BID  . metronidazole  500 mg Intravenous Q8H  . potassium chloride  40 mEq Oral Daily  . sodium  chloride  3 mL Intravenous Q12H  . Tamsulosin HCl  0.4 mg Oral q morning - 10a  . Ticagrelor  90 mg Oral BID  . vancomycin  125 mg Oral Q6H  . Warfarin - Pharmacist Dosing Inpatient   Does not apply q1800   . diltiazem (CARDIZEM) infusion 10 mg/hr (07/29/12 1900)  . heparin 2,350 Units/hr (07/29/12 1800)    No Known Allergies  History   Social History  . Marital Status: Married    Spouse Name: N/A    Number of Children: 1  . Years of Education: N/A   Occupational History  . retired [employer] for over 20 years  . retired     Personnel officer for 10 years   Social History Main Topics  . Smoking status: Former Smoker -- 0.50 packs/day for 50 years    Types: Cigarettes    Start date: 06/09/1958    Quit date: 06/10/2007  . Smokeless tobacco: Never Used  . Alcohol Use: No  . Drug Use: No  . Sexually Active: Not on file   Other Topics Concern  . Not on file   Social History Narrative  . No narrative on file    Family History  Problem Relation Age of Onset  . Stroke Mother   . Heart disease Father     died of heart attack  .  Lung cancer Brother   . Hypertension Brother   . Hypothyroidism Brother   . Hyperlipidemia Sister   . Hyperlipidemia Sister   . Breast cancer Sister     ROS- All systems are reviewed and negative except as per the HPI above  Physical Exam: Telemetry: Filed Vitals:   07/29/12 1230 07/29/12 1301 07/29/12 1654 07/29/12 2000  BP: 148/96 144/110 120/85 128/91  Pulse:  105 100 101  Temp:  97.9 F (36.6 C) 98 F (36.7 C) 97.6 F (36.4 C)  TempSrc:  Oral Oral Oral  Resp: 28 20 22 23   Height:  5\' 10"  (1.778 m)    Weight:  200 lb 9.9 oz (91 kg)    SpO2: 94% 95% 94% 93%    GEN- The patient is ill appearing, alert and oriented x 3 today.   Head- normocephalic, atraumatic Eyes-  Sclera clear, conjunctiva pink Ears- hearing intact Oropharynx- clear Neck- supple  Lungs- Clear to ausculation bilaterally, normal  work of breathing Heart- irregular rate and rhythm,  GI- soft, NT, ND, + BS Extremities- no clubbing, cyanosis, + dependant edema MS- no significant deformity or atrophy Skin- no rash or lesion  EKG- afib with V rate 138 bpm Echo- EF 60-65%, LA size 49 mm  Labs:   Lab Results  Component Value Date   WBC 5.2 07/29/2012   HGB 9.2* 07/29/2012   HCT 27.1* 07/29/2012   MCV 83.4 07/29/2012   PLT 134* 07/29/2012    Recent Labs Lab 07/24/12 1800  07/29/12 0340  NA 135  < > 136  K 3.3*  < > 3.6  CL 101  < > 107  CO2 19  < > 18*  BUN 16  < > 8  CREATININE 1.04  < > 0.92  CALCIUM 8.0*  < > 7.6*  PROT 5.8*  --   --   BILITOT 0.4  --   --   ALKPHOS 46  --   --   ALT 10  --   --   AST 9  --   --   GLUCOSE 187*  < > 208*  < > = values in this interval not displayed. Lab Results  Component Value Date   TROPONINI <0.30 07/25/2012    No results found for this basename: CHOL   No results found for this basename: HDL   No results found for this basename: LDLCALC   No results found for this basename: TRIG   No results found for this basename: CHOLHDL   No results found for this basename: LDLDIRECT      ASSESSMENT AND PLAN:   1. afib The patient has afib, likely as a consequence of medical illness.  His LA size of 49mm however suggests that he may have had prior atrial fibrillation undiagnosed leading to atropathy.  He has failed cardioversion.  He has lung CA for which resection is being considered.  I have spoken with Dr [name] who would like to consider surgical resection in 2-3 wks if clinically improved at that time.  I think that the patients risks of perioperative afib is very high.  I therefore would anticipate that the best option would be rate control going forward with coumadin for anticoagulation as outlined by Dr Clifton Puneet Masoner.  (If we were to try to pursue sinus rhythm at this time, then we would have to defer lung surgery for 4 weeks and then he would likely return to afib  post operatively.) After lung surgery, then we could again  consider our options.  His antiarrhythmic options at that point would be tikosyn (once taking POs post op) vs amiodarone. At this time, titrate PO metoprolol and wean IV diltiazem.  He will continue IV heparin until INR is therapeutic.  I will see him as needed while here.    Hillis Range, MD 07/29/2012  10:31 PM

## 2012-07-29 NOTE — Progress Notes (Signed)
[facility] PROGRESS NOTE  Brandon Clay AVW:[id number] DOB: Jul 05, 1934 DOA: 07/24/2012 PCP: Brandon Miyamoto, MD  Brief narrative: Pt is 78 yo male with multiple and complex medical history including CAD, 3 vessels w/ chronic occlusion of RCA, severe stenosis prox LCx & moderately severe dz in mid LAD, S/P PTCA/BMS to prox LCx, DAPT with ASA and Brilinta x1 month (Plavix nonresponder), LUL squamous lung cancer and with planned lobectomy by Dr. Tyrone Clay after one month of DAPT, presented to to [facility] ED 07/24/2012 with main concern of persistent and ongoing diarrhea of 2 days in durations, associated with crampy abdominal pain in the epigastric area, one episode of non bloody vomiting, subjective fevers, chills, poor oral intake. Patient was subsequently found to have C. difficile colitis.  In ED, pt found to be in atrial fibrillation. Per cardiology patient still requires Cardizem drip and the plan is for TEE this morning.  Assessment and Plan:   Principal Problem:  Diarrhea, vomiting, abdominal pain  Secondary to sepsis secondary to C. difficile colitis  Continue Flagyl and Vancomycin Active Problems:  Atrial fibrillation with RVR  On Cardizem drip per cardiology  Continue metoprolol 50 mg twice a day  Coumadin per pharmacy; heparin infusion per pharmacy  Replete electrolytes as needed, potassium 40 mEq by mouth daily  Continue Brilinta  Appreciate cardiology following  Plan for TEE cardioversion  Today 07/29/2012 Leukocytosis  Likely secondary to C. difficile colitis  White blood cell count now within normal limits Hypokalemia  Secondary to GI losses, diarrhea  Potassium chloride 40 mEq daily Hypertension  Continue Cardizem drip and metoprolol 50 mg by mouth twice a day Hypercholesterolemia  statin intolerant  Diabetes, type II and with complications  Hemoglobin A1c is 7.4 on this admission  Metformin held secondary to C. difficile colitis  Continue sliding scale  insulin Squamous cell lung cancer left upper lobe  scheduled for planned lobectomy by Dr. Tyrone Clay in 1 month after aspirin and DAPT COPD (chronic obstructive pulmonary disease)  This appears to be clinically stable  continue Symbicort  CAD (coronary artery disease), native coronary artery  Pt currently hemodynamically stable  continue aspirin and Brilinta  Anemia of chronic disease  stable Hgb and Hct and at pt's baseline  Hemoglobin of 9.2 today Thrombocytopenia  Likely secondary to heparin  Platelet count 134 today. Continue to monitor. Benign prostatic hypertrophy  continue Finasteride and Flomax   Code Status: Full code  Family Communication: No family at bedside  Disposition Plan: Pending   Consultants:  [place] cardiology Procedures:  Plan for cardioversion 07/29/2012 Antibiotics:  Flagyl (2/15) -->  Oral vancomycin (2/16) -->  Brandon Passey, MD  [facility]  Pager [phone]   If 7PM-7AM, please contact night-coverage [email or web address] Password [facility] 07/29/2012, 7:16 AM   LOS: 5 days    HPI/Subjective: No acute overnight events.  Objective: Filed Vitals:   07/29/12 0200 07/29/12 0400 07/29/12 0500 07/29/12 0600  BP: 147/80 150/70  130/86  Pulse: 90 93  94  Temp:  98.1 F (36.7 C)    TempSrc:  Oral    Resp: 19 21  22   Height:      Weight:   90.4 kg (199 lb 4.7 oz)   SpO2: 96% 95%  96%    Intake/Output Summary (Last 24 hours) at 07/29/12 0716 Last data filed at 07/29/12 0612  Gross per 24 hour  Intake    877 ml  Output   1026 ml  Net   -149 ml    Exam:  General:  Pt is alert, follows commands appropriately, not in acute distress  Cardiovascular: Regular rate and rhythm, S1/S2, no murmurs, no rubs, no gallops  Respiratory: Clear to auscultation bilaterally, no wheezing, no crackles, no rhonchi  Abdomen: Soft, non tender, non distended, bowel sounds present, no guarding  Extremities: No edema, pulses DP and PT palpable bilaterally  Neuro: Grossly  nonfocal  Data Reviewed: Basic Metabolic Panel:  Recent Labs Lab 07/24/12 1800 07/24/12 2140 07/25/12 0308 07/26/12 0330 07/27/12 0342 07/28/12 0255 07/29/12 0340  NA 135  --  132* 132* 133* 134* 136  K 3.3*  --  3.4* 3.1* 3.1* 3.0* 3.6  CL 101  --  97 105 102 103 107  CO2 19  --  21 17* 18* 19 18*  GLUCOSE 187*  --  193* 161* 172* 175* 208*  BUN 16  --  16 14 11 9 8   CREATININE 1.04  --  1.15 1.08 0.98 0.93 0.92  CALCIUM 8.0*  --  8.1* 7.2* 7.1* 7.4* 7.6*  MG  --  1.2*  --  1.5 1.8 1.7 1.6  PHOS  --  2.8  --   --  1.9* 2.1* 1.9*   Liver Function Tests:  Recent Labs Lab 07/24/12 1800  AST 9  ALT 10  ALKPHOS 46  BILITOT 0.4  PROT 5.8*  ALBUMIN 2.4*   No results found for this basename: LIPASE, AMYLASE,  in the last 168 hours No results found for this basename: AMMONIA,  in the last 168 hours CBC:  Recent Labs Lab 07/24/12 1800 07/25/12 0308 07/26/12 0330 07/27/12 0342 07/28/12 0255 07/29/12 0340  WBC 16.5* 16.6* 12.0* 9.5 7.5 5.2  NEUTROABS 14.1*  --   --   --   --   --   HGB 10.6* 10.6* 9.7* 10.1* 9.7* 9.2*  HCT 32.1* 31.8* 29.3* 29.6* 28.7* 27.1*  MCV 84.9 84.6 84.7 83.9 82.2 83.4  PLT 151 167 134* 131* 129* 134*   Cardiac Enzymes:  Recent Labs Lab 07/24/12 1800 07/24/12 2140 07/25/12 0308 07/25/12 0930  TROPONINI <0.30 <0.30 <0.30 <0.30   BNP: No components found with this basename: POCBNP,  CBG:  Recent Labs Lab 07/27/12 1159 07/27/12 1644 07/28/12 0735 07/28/12 1151 07/28/12 1612  GLUCAP 217* 220* 167* 204* 156*    Recent Results (from the past 240 hour(s))  CLOSTRIDIUM DIFFICILE BY PCR     Status: Abnormal   Collection Time    07/24/12 11:53 PM      Result Value Range Status   C difficile by pcr POSITIVE (*) NEGATIVE Final   Comment: CRITICAL RESULT CALLED TO, READ BACK BY AND VERIFIED WITH:     Brandon Clay,A RN 07/25/12 1124 Brandon Clay,K  MRSA PCR SCREENING     Status: None   Collection Time    07/25/12  2:10 AM      Result Value  Range Status   MRSA by PCR NEGATIVE  NEGATIVE Final   Comment:            The GeneXpert MRSA Assay (FDA     approved for NASAL specimens     only), is one component of a     comprehensive MRSA colonization     surveillance program. It is not     intended to diagnose MRSA     infection nor to guide or     monitor treatment for     MRSA infections.  OVA AND PARASITE EXAMINATION     Status: None  Collection Time    07/25/12  6:52 AM      Result Value Range Status   Specimen Description PERIRECTAL   Final   Special Requests NONE   Final   Ova and parasites NO OVA OR PARASITES SEEN   Final   Report Status 07/28/2012 FINAL   Final  URINE CULTURE     Status: None   Collection Time    07/25/12  2:08 PM      Result Value Range Status   Specimen Description URINE, CATHETERIZED   Final   Special Requests NONE   Final   Culture  Setup Time 07/25/2012 20:19   Final   Colony Count NO GROWTH   Final   Culture NO GROWTH   Final   Report Status 07/26/2012 FINAL   Final     Studies: No results found.  Scheduled Meds: . aspirin EC  81 mg Oral QODAY  . budesonide-formoterol  2 puff Inhalation BID  . finasteride  5 mg Oral q morning - 10a  . insulin aspart  0-9 Units Subcutaneous TID WC  . metoprolol tartrate  50 mg Oral BID  . metronidazole  500 mg Intravenous Q8H  . potassium chloride  40 mEq Oral Daily  . sodium chloride  3 mL Intravenous Q12H  . Tamsulosin HCl  0.4 mg Oral q morning - 10a  . Ticagrelor  90 mg Oral BID  . vancomycin  125 mg Oral Q6H  . Warfarin - Pharmacist Dosing Inpatient   Does not apply q1800   Continuous Infusions: . diltiazem (CARDIZEM) infusion 5 mg/hr (07/28/12 1728)  . heparin 2,100 Units/hr (07/29/12 0272)

## 2012-07-29 NOTE — Interval H&P Note (Signed)
History and Physical Interval Note:  07/29/2012 10:13 AM  Brandon Clay  has presented today for surgery, with the diagnosis of a-fib  The various methods of treatment have been discussed with the patient and family. After consideration of risks, benefits and other options for treatment, the patient has consented to  Procedure(s) with comments: TRANSESOPHAGEAL ECHOCARDIOGRAM (TEE) (N/A) - patient coming from [facility] room 1236 talk to jennifer at [facility]  talk to [facility] from care link will pick up pat. at 8:15 from  CARDIOVERSION (N/A) as a surgical intervention .  The patient's history has been reviewed, patient examined, no change in status, stable for surgery.  I have reviewed the patient's chart and labs.  Questions were answered to the patient's satisfaction.     Reniyah Gootee [employer]

## 2012-07-29 NOTE — Progress Notes (Signed)
PT Cancellation Note  Patient Details Name: Brandon Clay MRN: [id number] DOB: October 05, 1934   Cancelled Treatment:    Reason Eval/Treat Not Completed: Medical issues which prohibited therapy--for cardioversion.   Rada Hay 07/29/2012, 7:03 AM

## 2012-07-29 NOTE — H&P (View-Only) (Signed)
  SUBJECTIVE: Feeling much better this am. NO chest pain or SOB. Only 2 loose stools yesterday.   BP 130/86  Pulse 94  Temp(Src) 98.1 F (36.7 C) (Oral)  Resp 22  Ht 5' 10.08" (1.78 m)  Wt 199 lb 4.7 oz (90.4 kg)  BMI 28.53 kg/m2  SpO2 96%  Intake/Output Summary (Last 24 hours) at 07/29/12 0642 Last data filed at 07/29/12 0612  Gross per 24 hour  Intake    898 ml  Output   1026 ml  Net   -128 ml    PHYSICAL EXAM General: Well developed, well nourished, in no acute distress. Alert and oriented x 3.  Psych:  Good affect, responds appropriately Neck: No JVD. No masses noted.  Lungs: Clear bilaterally with no wheezes or rhonci noted.  Heart: Irreg irreg Abdomen: Bowel sounds are present. Soft, non-tender.  Extremities: No lower extremity edema.   LABS: Basic Metabolic Panel:  Recent Labs  07/28/12 0255 07/29/12 0340  NA 134* 136  K 3.0* 3.6  CL 103 107  CO2 19 18*  GLUCOSE 175* 208*  BUN 9 8  CREATININE 0.93 0.92  CALCIUM 7.4* 7.6*  MG 1.7 1.6  PHOS 2.1* 1.9*   CBC:  Recent Labs  07/28/12 0255 07/29/12 0340  WBC 7.5 5.2  HGB 9.7* 9.2*  HCT 28.7* 27.1*  MCV 82.2 83.4  PLT 129* 134*   Current Meds: . aspirin EC  81 mg Oral QODAY  . budesonide-formoterol  2 puff Inhalation BID  . finasteride  5 mg Oral q morning - 10a  . insulin aspart  0-9 Units Subcutaneous TID WC  . metoprolol tartrate  50 mg Oral BID  . metronidazole  500 mg Intravenous Q8H  . potassium chloride  40 mEq Oral Daily  . sodium chloride  3 mL Intravenous Q12H  . Tamsulosin HCl  0.4 mg Oral q morning - 10a  . Ticagrelor  90 mg Oral BID  . vancomycin  125 mg Oral Q6H  . Warfarin - Pharmacist Dosing Inpatient   Does not apply q1800     ASSESSMENT AND PLAN: 77 yo male with history of CAD with bare metal stent placement Circumflex artery 07/12/12 on ASA and Brilinta (plan for one month ending 08/09/12), newly diagnosed squamous cell lung cancer (awaiting lobectomy in March 2014 per Dr.  Gerhardt) admitted with C. Diff colitis, dehydration, atrial fibrillation with RVR.   1. New onset atrial fibrillation: In setting of dehydration/sepsis with C. Diff colitis. Rates better controlled this am on Cardizem drip. Continue metoprolol 50 mg po BID today. Continue heparin drip for now. Will arrange TEE guided cardioversion this am at . Long term anti-coagulation with be tricky since he needs to have lobectomy for lung cancer. The best plan will likely be a one month course of coumadin following cardioversion which can be held the week of lobectomy (presumably the lobectomy can wait for another four weeks). Coumadin started 07/27/12. (Will not use newer anticoagulant such as Xarelto or Pradaxa since he is on Brilinta for anti-platelet therapy with recent coronary stent). Will d/c ASA when INR is therapeutic and he will be treated with Brilinta and coumadin. NPO this am. Anticipate he will be ready for d/c home in 2 days from heart standpoint. Will need therapeutic INR before d/c home.   2. Coronary artery disease: recent bare-metal stent placement Circumflex artery 07/12/12. Continue ASA and Brilinta.   3. C. Diff colitis with significant diarrhea: Improving. Treatment per primary team.     4. Hyperlipidemia with statin intolerance   5. History of squamous cell lung cancer: Will need lobectomy in March when colitis cleared. He has seen Dr. Gerhardt.   6. Hypokalemia: Replace this am.   7. Urinary retention: He has had issues last two days with retention and has required in/out cath and now foley. May need to get urology consult to address.       Janashia Parco  2/20/20146:42 AM  

## 2012-07-29 NOTE — Procedures (Signed)
Electrical Cardioversion Procedure Note Brandon Clay [id number] 1934/11/30  Procedure: Electrical Cardioversion Indications:  Atrial Fibrillation  Procedure Details Consent: Risks of procedure as well as the alternatives and risks of each were explained to the (patient/caregiver).  Consent for procedure obtained. Time Out: Verified patient identification, verified procedure, site/side was marked, verified correct patient position, special equipment/implants available, medications/allergies/relevent history reviewed, required imaging and test results available.  Performed  Patient placed on cardiac monitor, pulse oximetry, supplemental oxygen as necessary.  Sedation given: Propofol IV (anesthesiology) Pacer pads placed anterior and posterior chest.  Cardioverted 1 time(s).  Cardioverted at 200J.  Evaluation Findings: Post procedure EKG shows: NSR Complications: None Patient did tolerate procedure well.   Brandon Clay 07/29/2012, 10:38 AM

## 2012-07-30 ENCOUNTER — Encounter (HOSPITAL_COMMUNITY): Payer: Self-pay | Admitting: Cardiology

## 2012-07-30 LAB — BASIC METABOLIC PANEL
BUN: 9 mg/dL (ref 6–23)
CO2: 18 mEq/L — ABNORMAL LOW (ref 19–32)
Calcium: 8.2 mg/dL — ABNORMAL LOW (ref 8.4–10.5)
Chloride: 104 mEq/L (ref 96–112)
Creatinine, Ser: 0.86 mg/dL (ref 0.50–1.35)
GFR calc Af Amer: >90 mL/min (ref >90)

## 2012-07-30 LAB — CBC
Platelets: 152 10*3/uL (ref 150–400)
RBC: 3.43 MIL/uL — ABNORMAL LOW (ref 4.22–5.81)
RDW: 17.3 % — ABNORMAL HIGH (ref 11.5–15.5)
WBC: 6.3 10*3/uL (ref 4.0–10.5)

## 2012-07-30 LAB — GLUCOSE, CAPILLARY
Glucose-Capillary: 199 mg/dL — ABNORMAL HIGH (ref 70–99)
Glucose-Capillary: 238 mg/dL — ABNORMAL HIGH (ref 70–99)
Glucose-Capillary: 229 mg/dL — ABNORMAL HIGH (ref 70–99)

## 2012-07-30 LAB — PROTIME-INR: Prothrombin Time: 23.4 seconds — ABNORMAL HIGH (ref 11.6–15.2)

## 2012-07-30 LAB — HEPARIN LEVEL (UNFRACTIONATED): Heparin Unfractionated: 0.47 IU/mL (ref 0.30–0.70)

## 2012-07-30 LAB — MAGNESIUM: Magnesium: 1.5 mg/dL (ref 1.5–2.5)

## 2012-07-30 MED ORDER — WARFARIN SODIUM 3 MG PO TABS
3.0000 mg | ORAL_TABLET | Freq: Once | ORAL | Status: AC
  Administered 2012-07-30: 3 mg via ORAL
  Filled 2012-07-30: qty 1

## 2012-07-30 MED ORDER — INSULIN DETEMIR 100 UNIT/ML ~~LOC~~ SOLN
14.0000 [IU] | Freq: Every day | SUBCUTANEOUS | Status: DC
  Administered 2012-07-30: 14 [IU] via SUBCUTANEOUS
  Filled 2012-07-30 (×2): qty 10

## 2012-07-30 MED ORDER — METRONIDAZOLE 500 MG PO TABS
500.0000 mg | ORAL_TABLET | Freq: Three times a day (TID) | ORAL | Status: DC
  Administered 2012-07-30: 500 mg via ORAL
  Filled 2012-07-30 (×3): qty 1

## 2012-07-30 NOTE — Progress Notes (Signed)
Physical Therapy Treatment Patient Details Name: Brandon Clay MRN: [id number] DOB: 1935-05-20 Today's Date: 07/30/2012 Time: 7829-5621 PT Time Calculation (min): 34 min  PT Assessment / Plan / Recommendation Comments on Treatment Session  Pt admitted with Cdiff and Afib s/p cardioversion with period of NSR then returned to Afib. Pt very pleasant and eager to mobilize. Initially walked 20' in room with HR to 144 at times and RN notified and adjusted meds while seated. Attempted ambulation second time with HR 136 or under for 40 ft then spike to 151 with standing rest decreased to 140 and maintained 144-135 with return to room. At rest HR back to 115 and sats 95% on 1.5L With ambulation on RA pt with sats 88-93% with cueing for breathing technique. Pt encouraged to continue mobility with staff and HEP.    Follow Up Recommendations        Does the patient have the potential to tolerate intense rehabilitation     Barriers to Discharge        Equipment Recommendations       Recommendations for Other Services    Frequency     Plan Discharge plan remains appropriate;Frequency remains appropriate    Precautions / Restrictions Precautions Precautions: Fall Precaution Comments: watch HR and O2   Pertinent Vitals/Pain No pain    Mobility  Bed Mobility Supine to Sit: 6: Modified independent (Device/Increase time);With rails;HOB flat Sit to Supine: 6: Modified independent (Device/Increase time) Transfers Sit to Stand: 5: Supervision;From bed;From chair/3-in-1 Stand to Sit: 5: Supervision;To chair/3-in-1;With armrests Details for Transfer Assistance: cueing for hand placement and safety  Ambulation/Gait Ambulation/Gait Assistance: 4: Min guard Ambulation Distance (Feet): 80 Feet (20' then 42') Assistive device: Rolling walker Ambulation/Gait Assistance Details: cueing to step into RW pt maintains self posterior to RW Gait Pattern: Step-through pattern;Decreased stride length Gait  velocity: decreased    Exercises General Exercises - Lower Extremity Long Arc Quad: AROM;Both;20 reps;Seated Hip Flexion/Marching: AROM;Both;20 reps;Seated Toe Raises: AROM;Both;20 reps;Seated Heel Raises: AROM;Both;20 reps;Seated   PT Diagnosis:    PT Problem List:   PT Treatment Interventions:     PT Goals Acute Rehab PT Goals PT Goal: Supine/Side to Sit - Progress: Progressing toward goal Pt will go Sit to Stand: with modified independence PT Goal: Sit to Stand - Progress: Updated due to goal met Pt will go Stand to Sit: with modified independence PT Goal: Stand to Sit - Progress: Updated due to goals met Pt will Ambulate: >150 feet;with least restrictive assistive device;with modified independence PT Goal: Ambulate - Progress: Updated due to goal met PT Goal: Perform Home Exercise Program - Progress: Progressing toward goal  Visit Information  Last PT Received On: 07/30/12 Assistance Needed: +1    Subjective Data  Subjective: I'm supposed to have surgery sometime   Cognition  Cognition Overall Cognitive Status: Appears within functional limits for tasks assessed/performed Arousal/Alertness: Awake/alert Orientation Level: Appears intact for tasks assessed Behavior During Session: [facility] for tasks performed    Balance     End of Session PT - End of Session Equipment Utilized During Treatment: Gait belt Activity Tolerance: Patient tolerated treatment well Patient left: in chair;with call bell/phone within reach Nurse Communication: Mobility status   GP     Delorse Lek 07/30/2012, 9:03 AM Delaney Meigs, PT [phone]

## 2012-07-30 NOTE — Progress Notes (Signed)
ANTICOAGULATION CONSULT NOTE - Follow Up Consult  Pharmacy Consult for IV Heparin, Warfarin Indication: atrial fibrillation  No Known Allergies  Patient Measurements: Height: 5\' 10"  (177.8 cm) Weight: 202 lb 13.2 oz (92 kg) IBW/kg (Calculated) : 73 Heparin Dosing Weight: 81.4 kg  Vital Signs: Temp: 97.4 F (36.3 C) (02/21 0436) Temp src: Oral (02/21 0436) BP: 126/79 mmHg (02/21 0436) Pulse Rate: 80 (02/21 0436)  Labs:  Recent Labs  07/28/12 0255 07/29/12 0340 07/29/12 1426 07/30/12 0513  HGB 9.7* 9.2*  --  9.5*  HCT 28.7* 27.1*  --  27.8*  PLT 129* 134*  --  152  LABPROT 16.1* 17.2*  --  23.4*  INR 1.32 1.44  --  2.19*  HEPARINUNFRC 0.35 0.27* 0.49 0.47  CREATININE 0.93 0.92  --  0.86    Estimated Creatinine Clearance: 84.6 ml/min (by C-G formula based on Cr of 0.86).  Medications:  Infusions:  . diltiazem (CARDIZEM) infusion 10 mg/hr (07/30/12 0313)  . heparin 2,350 Units/hr (07/30/12 0821)  . [DISCONTINUED] sodium chloride 10 mL/hr at 07/29/12 1041  . [DISCONTINUED] diltiazem (CARDIZEM) infusion 5 mg/hr (07/28/12 1728)   Assessment:  77 yom with complex cardiac h/o (including afib) and recent dx lung cancer presented 2/15 with afib with RVR. IV heparin started 2/16, Warfarin started 2/18.   Heparin level therapeutic. INR 2.19 (big jump from yesterday)  Goal of Therapy:  INR 2-3 Heparin level 0.3-0.7 units/ml Monitor platelets by anticoagulation protocol: Yes   Plan:   Continue heparin IV infusion to 2350 units/hr.  Recheck heparin level in AM  Coumadin 3mg  PO x1  Change flagyl to PO, will leave note to see if we can dc flagyl since on coumadin.

## 2012-07-30 NOTE — Progress Notes (Signed)
Patient ID: Brandon Clay, male   DOB: [birth year], 78 y.o.   MRN: [id number]    SUBJECTIVE: Feeling much better this am. NO chest pain or SOB. Ambulating in hall  BP 126/79  Pulse 80  Temp(Src) 97.4 F (36.3 C) (Oral)  Resp 21  Ht 5\' 10"  (1.778 m)  Wt 202 lb 13.2 oz (92 kg)  BMI 29.1 kg/m2  SpO2 94%  Intake/Output Summary (Last 24 hours) at 07/30/12 0849 Last data filed at 07/30/12 0600  Gross per 24 hour  Intake 778.21 ml  Output   1325 ml  Net -546.79 ml    PHYSICAL EXAM General: Well developed, well nourished, in no acute distress. Alert and oriented x 3.  Psych:  Good affect, responds appropriately Neck: No JVD. No masses noted.  Lungs: Clear bilaterally with no wheezes or rhonci noted.  Heart: Irreg irreg Abdomen: Bowel sounds are present. Soft, non-tender.  Extremities: No lower extremity edema.   LABS: Basic Metabolic Panel:  Recent Labs  08/65/78 0340 07/30/12 0513  NA 136 137  K 3.6 3.6  CL 107 104  CO2 18* 18*  GLUCOSE 208* 200*  BUN 8 9  CREATININE 0.92 0.86  CALCIUM 7.6* 8.2*  MG 1.6 1.5  PHOS 1.9* 1.9*   CBC:  Recent Labs  07/29/12 0340 07/30/12 0513  WBC 5.2 6.3  HGB 9.2* 9.5*  HCT 27.1* 27.8*  MCV 83.4 81.0  PLT 134* 152   Current Meds: . aspirin EC  81 mg Oral QODAY  . budesonide-formoterol  2 puff Inhalation BID  . finasteride  5 mg Oral q morning - 10a  . insulin aspart  0-9 Units Subcutaneous TID WC  . metoprolol tartrate  100 mg Oral BID  . metroNIDAZOLE  500 mg Oral Q8H  . potassium chloride  40 mEq Oral Daily  . sodium chloride  3 mL Intravenous Q12H  . Tamsulosin HCl  0.4 mg Oral q morning - 10a  . Ticagrelor  90 mg Oral BID  . vancomycin  125 mg Oral Q6H  . warfarin  3 mg Oral ONCE-1800  . Warfarin - Pharmacist Dosing Inpatient   Does not apply q1800     ASSESSMENT AND PLAN: 78 yo male with history of CAD with bare metal stent placement Circumflex artery 07/12/12 on ASA and Brilinta (plan for one month ending  08/09/12), newly diagnosed squamous cell lung cancer (awaiting lobectomy in March 2014 per Dr. [name]) admitted with C. Diff colitis, dehydration, atrial fibrillation with RVR.   1. New onset atrial fibrillation: In setting of dehydration/sepsis with C. Diff colitis. Rates better controlled this am on Cardizem drip. Continue metoprolol 100 mg po BID today. Continue heparin drip until INR Rx  Will arrange Failed TEE/DCC  Transition to PO cardizem in am  Can stop coumadin and Brilinta 5 days before lobectomy  2. Coronary artery disease: recent bare-metal stent placement Circumflex artery 07/12/12. Continue ASA and Brilinta.   3. C. Diff colitis with significant diarrhea: Improving. Treatment per primary team.   4. Hyperlipidemia with statin intolerance   5. History of squamous cell lung cancer: Will need lobectomy in March when colitis cleared. He has seen Dr. [name].   6. Hypokalemia: Replace this am.   7. Urinary retention: He has had issues last two days with retention and has required in/out cath and now foley. May need to get urology consult to address.       Charlton Haws  2/21/20148:49 AM

## 2012-07-30 NOTE — Progress Notes (Addendum)
[facility] Progress Note [place] TEAM 1 - Stepdown/ICU TEAM   KLEIN WILLCOX BMW:[id number] DOB: [birth year] DOA: 07/24/2012 PCP: [name], MD  Brief narrative: 78 yo male with multiple and complex medical history including CAD, 3 vessels w/ chronic occlusion of RCA, severe stenosis prox LCx & moderately severe dz in mid LAD, S/P PTCA/BMS to prox LCx, DAPT with ASA and Brilinta x1 month (Plavix nonresponder), LUL squamous lung cancer with planned lobectomy by Dr. [name] after one month of DAPT, presented to to [facility] ED 07/24/2012 with main concern of persistent and ongoing diarrhea of 2 days in durations, associated with crampy abdominal pain in the epigastric area, one episode of non bloody vomiting, subjective fevers, chills, poor oral intake. Patient was subsequently found to have C. difficile colitis.  In ED, pt found to be in atrial fibrillation.   Assessment/Plan:  Diarrhea, vomiting, abdominal pain - C diff colitis Continue Flagyl and Vancomycin  Atrial fibrillation with RVR  Management as per cardiology  Hypokalemia  Secondary to GI losses, diarrhea  Potassium chloride 40 mEq daily  Hypertension  Well-controlled at the present time  Hypercholesterolemia  statin intolerant   Diabetes, type II and with complications  Hemoglobin A1c is 7.4 on this admission  Metformin held secondary to C. difficile colitis  Continue sliding scale insulin and follow without change today  Squamous cell lung cancer left upper lobe  scheduled for planned lobectomy by Dr. [name] in 1 month after aspirin and DAPT  COPD (chronic obstructive pulmonary disease)  clinically stable  continue Symbicort   CAD (coronary artery disease), native coronary artery  Care as per cardiology  Anemia of chronic disease  stable Hgb and Hct and at pt's baseline   Thrombocytopenia  Likely secondary to heparin  Stable at this time  Benign prostatic hypertrophy  continue Finasteride  and Flomax   Code Status: FULL Family Communication: No family present Disposition Plan: Step down unit  Consultants: Cardiology Electrophysiology Thoracic surgery  Procedures: 07/29/2012 - TEE with [facility]  Antibiotics: Vancomycin 2/16>> Flagyl 2/16>>2/21  DVT prophylaxis: Fully anticoagulated  HPI/Subjective: The patient is awake alert and conversant.  He reports he's only had one loose stool today.  He denies chest pain fevers chills nausea or vomiting.   Objective: Blood pressure 125/63, pulse 77, temperature 97.4 F (36.3 C), temperature source Oral, resp. rate 20, height 5\' 10"  (1.778 m), weight 92 kg (202 lb 13.2 oz), SpO2 98.00%.  Intake/Output Summary (Last 24 hours) at 07/30/12 1800 Last data filed at 07/30/12 1700  Gross per 24 hour  Intake 1786.71 ml  Output    625 ml  Net 1161.71 ml    Exam: General: No acute respiratory distress Lungs: Clear to auscultation bilaterally without wheezes or crackles Cardiovascular: Rate is approximately 100 beats per minute without appreciable gallop or rub Abdomen: Nontender, nondistended, soft, bowel sounds positive, no rebound, no ascites, no appreciable mass Extremities: No significant cyanosis, clubbing, or edema bilateral lower extremities  Data Reviewed: Basic Metabolic Panel:  Recent Labs Lab 07/24/12 2140  07/26/12 0330 07/27/12 0342 07/28/12 0255 07/29/12 0340 07/30/12 0513  NA  --   < > 132* 133* 134* 136 137  K  --   < > 3.1* 3.1* 3.0* 3.6 3.6  CL  --   < > 105 102 103 107 104  CO2  --   < > 17* 18* 19 18* 18*  GLUCOSE  --   < > 161* 172* 175* 208* 200*  BUN  --   < >  14 11 9 8 9   CREATININE  --   < > 1.08 0.98 0.93 0.92 0.86  CALCIUM  --   < > 7.2* 7.1* 7.4* 7.6* 8.2*  MG 1.2*  --  1.5 1.8 1.7 1.6 1.5  PHOS 2.8  --   --  1.9* 2.1* 1.9* 1.9*  < > = values in this interval not displayed. Liver Function Tests:  Recent Labs Lab 07/24/12 1800  AST 9  ALT 10  ALKPHOS 46  BILITOT 0.4  PROT 5.8*   ALBUMIN 2.4*   CBC:  Recent Labs Lab 07/24/12 1800  07/26/12 0330 07/27/12 0342 07/28/12 0255 07/29/12 0340 07/30/12 0513  WBC 16.5*  < > 12.0* 9.5 7.5 5.2 6.3  NEUTROABS 14.1*  --   --   --   --   --   --   HGB 10.6*  < > 9.7* 10.1* 9.7* 9.2* 9.5*  HCT 32.1*  < > 29.3* 29.6* 28.7* 27.1* 27.8*  MCV 84.9  < > 84.7 83.9 82.2 83.4 81.0  PLT 151  < > 134* 131* 129* 134* 152  < > = values in this interval not displayed. Cardiac Enzymes:  Recent Labs Lab 07/24/12 1800 07/24/12 2140 07/25/12 0308 07/25/12 0930  TROPONINI <0.30 <0.30 <0.30 <0.30   BNP (last 3 results)  Recent Labs  07/24/12 2140  PROBNP 15551.0*   CBG:  Recent Labs Lab 07/29/12 1305 07/29/12 1706 07/30/12 0758 07/30/12 1203 07/30/12 1703  GLUCAP 196* 212* 199* 238* 229*    Recent Results (from the past 240 hour(s))  CLOSTRIDIUM DIFFICILE BY PCR     Status: Abnormal   Collection Time    07/24/12 11:53 PM      Result Value Range Status   C difficile by pcr POSITIVE (*) NEGATIVE Final   Comment: CRITICAL RESULT CALLED TO, READ BACK BY AND VERIFIED WITH:     ARNOLD,A RN 07/25/12 1124 WOOTEN,K  MRSA PCR SCREENING     Status: None   Collection Time    07/25/12  2:10 AM      Result Value Range Status   MRSA by PCR NEGATIVE  NEGATIVE Final   Comment:            The GeneXpert MRSA Assay (FDA     approved for NASAL specimens     only), is one component of a     comprehensive MRSA colonization     surveillance program. It is not     intended to diagnose MRSA     infection nor to guide or     monitor treatment for     MRSA infections.  OVA AND PARASITE EXAMINATION     Status: None   Collection Time    07/25/12  6:52 AM      Result Value Range Status   Specimen Description PERIRECTAL   Final   Special Requests NONE   Final   Ova and parasites NO OVA OR PARASITES SEEN   Final   Report Status 07/28/2012 FINAL   Final  URINE CULTURE     Status: None   Collection Time    07/25/12  2:08 PM       Result Value Range Status   Specimen Description URINE, CATHETERIZED   Final   Special Requests NONE   Final   Culture  Setup Time 07/25/2012 20:19   Final   Colony Count NO GROWTH   Final   Culture NO GROWTH   Final   Report Status  07/26/2012 FINAL   Final     Studies:  Recent x-ray studies have been reviewed in detail by the Attending Physician  Scheduled Meds:  Reviewed in detail by the Attending Physician   [facility] T  [facility] Office  [phone] Pager - Text Page per Amion as per below:  On-Call/Text Page:      [email or web address]      password [email or web address]  If 7PM-7AM, please contact night-coverage [email or web address] Password [email or web address] 07/30/2012, 6:00 PM   LOS: 6 days

## 2012-07-30 NOTE — Progress Notes (Signed)
                   [address]            [place] [address]          [phone]      1 Day Post-Op Procedure(s) (LRB): TRANSESOPHAGEAL ECHOCARDIOGRAM (TEE) (N/A) CARDIOVERSION (N/A)  Subjective: He is feeling better this morning. He has already walked with PT. He denies shortness of breath or chest pain. Has not had a loose stool this am yet  Objective: Vital signs in last 24 hours: Temp:  [97.4 F (36.3 C)-98 F (36.7 C)] 97.4 F (36.3 C) (02/21 0436) Pulse Rate:  [76-135] 135 (02/21 0800) Cardiac Rhythm:  [-] Normal sinus rhythm (02/21 0436) Resp:  [10-54] 21 (02/21 0436) BP: (120-209)/(50-111) 126/79 mmHg (02/21 0436) SpO2:  [88 %-100 %] 94 % (02/21 0847) Weight:  [90.266 kg (199 lb)-92 kg (202 lb 13.2 oz)] 92 kg (202 lb 13.2 oz) (02/20 2346)   Current Weight  07/29/12 92 kg (202 lb 13.2 oz)      Intake/Output from previous day: 02/20 0701 - 02/21 0700 In: 778.2 [I.V.:778.2] Out: 1325 [Urine:1325]   Physical Exam:  Cardiovascular: IRRR IRRR, no murmurs, gallops, or rubs. Pulmonary: Clear to auscultation bilaterally; no rales, wheezes, or rhonchi. Abdomen: Soft, non tender, bowel sounds present. Extremities: Trace bilateral lower extremity edema.   Lab Results: CBC: Recent Labs  07/29/12 0340 07/30/12 0513  WBC 5.2 6.3  HGB 9.2* 9.5*  HCT 27.1* 27.8*  PLT 134* 152   BMET:  Recent Labs  07/29/12 0340 07/30/12 0513  NA 136 137  K 3.6 3.6  CL 107 104  CO2 18* 18*  GLUCOSE 208* 200*  BUN 8 9  CREATININE 0.92 0.86  CALCIUM 7.6* 8.2*    PT/INR:  Lab Results  Component Value Date   INR 2.19* 07/30/2012   INR 1.44 07/29/2012   INR 1.32 07/28/2012   ABG:  INR: Will add last result for INR, ABG once components are confirmed Will add last 4 CBG results once components are confirmed  Assessment/Plan:  1. CV - New onset atrial fibrillation wit RVR. On Cardizem drip, Lopressor 100 bid, Coumadin, and Heparin drip. Per  cardiology, change to po Cardizem in the am. History of CAD (s/p PTCA with BMS to Circumflex 2/14.) 2.  Pulmonary - Recently diagnosed with SCC. Will need eventual lobectomy once recovers 3.C Dif-symptoms improving. On Flagyl and Vancomycin 4.Urinary retention-has foley. On Proscar and Flomax.  ZIMMERMAN,DONIELLE MPA-C 07/30/2012,9:02 AM

## 2012-07-30 NOTE — Progress Notes (Signed)
Inpatient Diabetes Program Recommendations  AACE/ADA: New Consensus Statement on Inpatient Glycemic Control (2013)  Target Ranges:  Prepandial:   less than 140 mg/dL      Peak postprandial:   less than 180 mg/dL (1-2 hours)      Critically ill patients:  140 - 180 mg/dL  Results for KESTON, SEEVER (MRN [id number]) as of 07/30/2012 08:14  Ref. Range 07/29/2012 08:05 07/29/2012 13:05 07/29/2012 17:06 07/30/2012 07:58  Glucose-Capillary Latest Range: 70-99 mg/dL 782 (H) 956 (H) 213 (H) 199 (H)   Inpatient Diabetes Program Recommendations Insulin - Basal: add Lantus 10 units at HS Diet: . Thank you  Piedad Climes BSN, RN,CDE Inpatient Diabetes Coordinator [phone] (team pager)

## 2012-07-31 DIAGNOSIS — R197 Diarrhea, unspecified: Secondary | ICD-10-CM

## 2012-07-31 DIAGNOSIS — I2510 Atherosclerotic heart disease of native coronary artery without angina pectoris: Secondary | ICD-10-CM

## 2012-07-31 DIAGNOSIS — C3490 Malignant neoplasm of unspecified part of unspecified bronchus or lung: Secondary | ICD-10-CM

## 2012-07-31 LAB — CBC
HCT: 26 % — ABNORMAL LOW (ref 39.0–52.0)
MCH: 28.1 pg (ref 26.0–34.0)
MCV: 81.3 fL (ref 78.0–100.0)
Platelets: 177 10*3/uL (ref 150–400)
RDW: 17.4 % — ABNORMAL HIGH (ref 11.5–15.5)
WBC: 7.5 10*3/uL (ref 4.0–10.5)

## 2012-07-31 LAB — BASIC METABOLIC PANEL
BUN: 11 mg/dL (ref 6–23)
Chloride: 105 mEq/L (ref 96–112)
GFR calc Af Amer: >90 mL/min (ref >90)
GFR calc non Af Amer: 83 mL/min — ABNORMAL LOW (ref >90)
Potassium: 3.1 mEq/L — ABNORMAL LOW (ref 3.5–5.1)
Sodium: 137 mEq/L (ref 135–145)

## 2012-07-31 LAB — HEPARIN LEVEL (UNFRACTIONATED): Heparin Unfractionated: 0.86 IU/mL — ABNORMAL HIGH (ref 0.30–0.70)

## 2012-07-31 LAB — GLUCOSE, CAPILLARY
Glucose-Capillary: 155 mg/dL — ABNORMAL HIGH (ref 70–99)
Glucose-Capillary: 247 mg/dL — ABNORMAL HIGH (ref 70–99)
Glucose-Capillary: 249 mg/dL — ABNORMAL HIGH (ref 70–99)

## 2012-07-31 MED ORDER — INSULIN DETEMIR 100 UNIT/ML ~~LOC~~ SOLN
18.0000 [IU] | Freq: Every day | SUBCUTANEOUS | Status: DC
  Administered 2012-07-31 – 2012-08-02 (×3): 18 [IU] via SUBCUTANEOUS
  Filled 2012-07-31 (×2): qty 10

## 2012-07-31 MED ORDER — DILTIAZEM HCL 60 MG PO TABS
60.0000 mg | ORAL_TABLET | Freq: Three times a day (TID) | ORAL | Status: DC
  Administered 2012-07-31 – 2012-08-03 (×9): 60 mg via ORAL
  Filled 2012-07-31 (×12): qty 1

## 2012-07-31 MED ORDER — POTASSIUM CHLORIDE CRYS ER 20 MEQ PO TBCR
40.0000 meq | EXTENDED_RELEASE_TABLET | Freq: Every day | ORAL | Status: DC
  Administered 2012-07-31: 40 meq via ORAL
  Filled 2012-07-31: qty 2

## 2012-07-31 MED ORDER — K PHOS MONO-SOD PHOS DI & MONO 155-852-130 MG PO TABS
250.0000 mg | ORAL_TABLET | Freq: Two times a day (BID) | ORAL | Status: DC
  Administered 2012-07-31 – 2012-08-01 (×3): 250 mg via ORAL
  Filled 2012-07-31 (×4): qty 1

## 2012-07-31 MED ORDER — POTASSIUM CHLORIDE CRYS ER 20 MEQ PO TBCR
40.0000 meq | EXTENDED_RELEASE_TABLET | Freq: Two times a day (BID) | ORAL | Status: AC
  Administered 2012-07-31 – 2012-08-02 (×5): 40 meq via ORAL
  Filled 2012-07-31 (×6): qty 2

## 2012-07-31 NOTE — Progress Notes (Signed)
Patient ID: Brandon Clay, male   DOB: March 24, 1935, 78 y.o.   MRN: [id number]    SUBJECTIVE: Feeling much better this am. NO chest pain or SOB. Ambulating in hall no diarhea Eating a banana   BP 138/80  Pulse 103  Temp(Src) 96.6 F (35.9 C) (Oral)  Resp 20  Ht 5\' 10"  (1.778 m)  Wt 198 lb 3.1 oz (89.9 kg)  BMI 28.44 kg/m2  SpO2 97%  Intake/Output Summary (Last 24 hours) at 07/31/12 1145 Last data filed at 07/31/12 0900  Gross per 24 hour  Intake 1295.33 ml  Output    525 ml  Net 770.33 ml    PHYSICAL EXAM General: Well developed, well nourished, in no acute distress. Alert and oriented x 3.  Psych:  Good affect, responds appropriately Neck: No JVD. No masses noted.  Lungs: Clear bilaterally with no wheezes or rhonci noted.  Heart: Irreg irreg Abdomen: Bowel sounds are present. Soft, non-tender.  Extremities: No lower extremity edema.   LABS: Basic Metabolic Panel:  Recent Labs  21/30/86 0513 07/31/12 0540  NA 137 137  K 3.6 3.1*  CL 104 105  CO2 18* 21  GLUCOSE 200* 174*  BUN 9 11  CREATININE 0.86 0.86  CALCIUM 8.2* 8.3*  MG 1.5 1.4*  PHOS 1.9* 1.4*   CBC:  Recent Labs  07/30/12 0513 07/31/12 0540  WBC 6.3 7.5  HGB 9.5* 9.0*  HCT 27.8* 26.0*  MCV 81.0 81.3  PLT 152 177   Current Meds: . aspirin EC  81 mg Oral QODAY  . budesonide-formoterol  2 puff Inhalation BID  . finasteride  5 mg Oral q morning - 10a  . insulin aspart  0-9 Units Subcutaneous TID WC  . insulin detemir  14 Units Subcutaneous QHS  . metoprolol tartrate  100 mg Oral BID  . potassium chloride  40 mEq Oral Daily  . sodium chloride  3 mL Intravenous Q12H  . Tamsulosin HCl  0.4 mg Oral q morning - 10a  . Ticagrelor  90 mg Oral BID  . vancomycin  125 mg Oral Q6H  . Warfarin - Pharmacist Dosing Inpatient   Does not apply q1800     ASSESSMENT AND PLAN: 78 yo male with history of CAD with bare metal stent placement Circumflex artery 07/12/12 on ASA and Brilinta (plan for one month  ending 08/09/12), newly diagnosed squamous cell lung cancer (awaiting lobectomy in March 2014 per Dr. [name]) admitted with C. Diff colitis, dehydration, atrial fibrillation with RVR.   1. New onset atrial fibrillation: In setting of dehydration/sepsis with C. Diff colitis.  Continue metoprolol 100 mg po BID today. INR supraRx none today D/C iv cardizem and change to PO 60 q 8 hours   2. Coronary artery disease: recent bare-metal stent placement Circumflex artery 07/12/12. Continue ASA and Brilinta.   3. C. Diff colitis with significant diarrhea: Improving. Treatment per primary team.   4. Hyperlipidemia with statin intolerance   5. History of squamous cell lung cancer: Will need lobectomy in March when colitis cleared. He has seen Dr. [name].   6. Hypokalemia: Replace this am.   7. Urinary retention: He has had issues last two days with retention and has required in/out cath and now foley. May need to get urology consult to address.       Charlton Haws  2/22/201411:45 AM

## 2012-07-31 NOTE — Progress Notes (Addendum)
                   [address]            [place] [address]          [phone]      2 Days Post-Op Procedure(s) (LRB): TRANSESOPHAGEAL ECHOCARDIOGRAM (TEE) (N/A) CARDIOVERSION (N/A)  Subjective: He is sleeping, not awakened  Objective: Vital signs in last 24 hours: Temp:  [96.6 F (35.9 C)-98.3 F (36.8 C)] 96.6 F (35.9 C) (02/22 0829) Pulse Rate:  [67-116] 103 (02/22 0900) Cardiac Rhythm:  [-] Atrial fibrillation (02/22 0800) Resp:  [16-22] 20 (02/22 0900) BP: (108-138)/(50-112) 138/80 mmHg (02/22 0829) SpO2:  [92 %-100 %] 97 % (02/22 0905) Weight:  [89.9 kg (198 lb 3.1 oz)-90 kg (198 lb 6.6 oz)] 89.9 kg (198 lb 3.1 oz) (02/22 0500)   Current Weight  07/31/12 89.9 kg (198 lb 3.1 oz)      Intake/Output from previous day: 02/21 0701 - 02/22 0700 In: 1909.3 [P.O.:1080; I.V.:829.3] Out: 400 [Urine:400]   Physical Exam:  Cardiovascular: IRRR IRRR, no murmurs, gallops, or rubs. Pulmonary: Clear to auscultation bilaterally; no rales, wheezes, or rhonchi. Abdomen: Soft, non tender, bowel sounds present. Extremities: Trace bilateral lower extremity edema.   Lab Results: CBC:  Recent Labs  07/30/12 0513 07/31/12 0540  WBC 6.3 7.5  HGB 9.5* 9.0*  HCT 27.8* 26.0*  PLT 152 177   BMET:   Recent Labs  07/30/12 0513 07/31/12 0540  NA 137 137  K 3.6 3.1*  CL 104 105  CO2 18* 21  GLUCOSE 200* 174*  BUN 9 11  CREATININE 0.86 0.86  CALCIUM 8.2* 8.3*    PT/INR:  Lab Results  Component Value Date   INR 3.48* 07/31/2012   INR 2.19* 07/30/2012   INR 1.44 07/29/2012   ABG:  INR: Will add last result for INR, ABG once components are confirmed Will add last 4 CBG results once components are confirmed  Assessment/Plan:  1. CV - New onset atrial fibrillation wit RVR. On Cardizem drip, Lopressor 100 bid, Coumadin, and  Brilinta. Per cardiology, change to po Cardizem in the am. History of CAD (s/p PTCA with BMS to Circumflex 2/14.) 2.   Pulmonary - Recently diagnosed with SCC. Will need eventual lobectomy once recovers 3.C Dif-symptoms improving. On Flagyl and Vancomycin 4.Urinary retention-has foley. On Proscar and Flomax. 5.Supplement potassium per medicine  ZIMMERMAN,DONIELLE MPA-C 07/31/2012,10:22 AM  patient examined and medical record reviewed,agree with above note. VAN TRIGT III,PETER 07/31/2012

## 2012-07-31 NOTE — Progress Notes (Signed)
ANTICOAGULATION CONSULT NOTE - Follow Up Consult  Pharmacy Consult for IV Heparin, Warfarin Indication: atrial fibrillation  No Known Allergies  Patient Measurements: Height: 5\' 10"  (177.8 cm) Weight: 198 lb 3.1 oz (89.9 kg) IBW/kg (Calculated) : 73 Heparin Dosing Weight: 81.4 kg  Vital Signs: Temp: 98.3 F (36.8 C) (02/22 0545) Temp src: Oral (02/22 0545) BP: 118/66 mmHg (02/22 0545) Pulse Rate: 94 (02/22 0545)  Labs:  Recent Labs  07/29/12 0340 07/29/12 1426 07/30/12 0513 07/31/12 0540  HGB 9.2*  --  9.5* 9.0*  HCT 27.1*  --  27.8* 26.0*  PLT 134*  --  152 177  LABPROT 17.2*  --  23.4* 33.0*  INR 1.44  --  2.19* 3.48*  HEPARINUNFRC 0.27* 0.49 0.47 0.86*  CREATININE 0.92  --  0.86 0.86    Estimated Creatinine Clearance: 83.8 ml/min (by C-G formula based on Cr of 0.86).  Medications:  Infusions:  . diltiazem (CARDIZEM) infusion 10 mg/hr (07/30/12 2106)  . heparin 2,350 Units/hr (07/30/12 2105)   Assessment: 77 yom with complex cardiac h/o (including afib) and recent dx lung cancer presented 2/15 with afib with RVR. IV heparin started 2/16, Warfarin started 2/18.   INR (3.48) and heparin level (0.86) are both above-goal.   Goal of Therapy:  INR 2-3 Heparin level 0.3-0.7 units/ml Monitor platelets by anticoagulation protocol: Yes   Plan: 1. Discontinue IV heparin as INR > 3.  2. No Coumadin for today.  3. Daily PT / INR.    Lorre Munroe, PharmD 07/31/12, 06:45 AM

## 2012-07-31 NOTE — Progress Notes (Signed)
[facility] Progress Note [place] TEAM 1 - Stepdown/ICU TEAM   EH SESAY ZOX:[id number] DOB: [birth year] DOA: 07/24/2012 PCP: [name], MD  Brief narrative: 78 yo male with multiple and complex medical history including CAD 3 vessels w/ chronic occlusion of RCA severe stenosis prox LCx & moderately severe dz in mid LAD, S/P PTCA/BMS to prox LCx, DAPT with ASA and Brilinta x1 month (Plavix nonresponder), LUL squamous lung cancer with planned lobectomy by Dr. [name] after one month of DAPT, presented to to [facility] ED 07/24/2012 with main concern of persistent and ongoing diarrhea of 2 days in durations, associated with crampy abdominal pain in the epigastric area, one episode of non bloody vomiting, subjective fevers, chills, poor oral intake. Patient was subsequently found to have C. difficile colitis.   In ED, pt found to be in atrial fibrillation.   Cardiology evaluated the patient [facility] and ultimately he was transferred to [facility] on 07/29/2012 to undergo TEE [facility].  Unfortunately this procedure failed as the patient converted back to atrial fibrillation within a few hours of the procedure.  He was therefore kept at [facility] and transferred to the step down unit for ongoing titration of Cardizem drip.  At this time the patient has been liberated from his Cardizem drip.  Treatment for his C. difficile colitis continues.  Clinically he appears to be stabilizing.  The plan at this time is to transfer him to a telemetry bed and to observe him over the next 48 hours with potential discharge on Monday or Tuesday as clinical stability allows.    Assessment/Plan:  Diarrhea, vomiting, abdominal pain - C diff colitis oral Vancomycin continues  - Flagyl was discontinued due to its tendency to interfere with Coumadin dosing   Atrial fibrillation with RVR  Management as per cardiology - failed TEE [facility] 07/29/2012 - rate now well controlled with oral  medications   Hypokalemia  Secondary to GI losses, diarrhea - increase replacement and follow trend   Hypertension  Well-controlled at the present time  Hypercholesterolemia  statin intolerant   Diabetes, type II and with complications  Hemoglobin A1c 7.4 this admission - Metformin held secondary to C. difficile colitis - continue sliding scale insulin - adjust treatment today as CBG is drifting upward   Squamous cell lung cancer left upper lobe  scheduled for planned lobectomy by Dr. [name] in 1 month after aspirin and DAPT  COPD (chronic obstructive pulmonary disease)  clinically stable - continue Symbicort   CAD (coronary artery disease), native coronary artery  Care as per Cardiology  Anemia of chronic disease  stable Hgb and Hct and at pt's baseline   Thrombocytopenia  Stable at this time  Benign prostatic hypertrophy  continue Finasteride and Flomax -  begin voiding trials and again - may need to consider Urecholine if retention recurs   Code Status: FULL Family Communication: discussed plan with the patient and wife at bedside  Disposition Plan: transfer to telemetry unit   Consultants: Cardiology Electrophysiology Thoracic surgery  Procedures: 07/29/2012 - TEE with [facility]  Antibiotics: Vancomycin 2/16>> Flagyl 2/16>>2/21  DVT prophylaxis: Fully anticoagulated  HPI/Subjective: The patient is awake alert and conversant.  He reports he has not had a stool today.  He denies chest pain fevers chills nausea or vomiting.  Objective: Blood pressure 122/67, pulse 91, temperature 97.6 F (36.4 C), temperature source Oral, resp. rate 20, height 5\' 10"  (1.778 m), weight 89.9 kg (198 lb 3.1 oz), SpO2 95.00%.  Intake/Output Summary (Last 24 hours) at 07/31/12 1553 Last data filed at 07/31/12 1200  Gross per 24 hour  Intake 736.33 ml  Output    525 ml  Net 211.33 ml    Exam: General: No acute respiratory distress Lungs: Clear to auscultation bilaterally  without wheezes or crackles Cardiovascular: Rate is approximately 90 beats per minute without appreciable gallop or rub Abdomen: Nontender, nondistended, soft, bowel sounds positive, no rebound, no ascites, no appreciable mass Extremities: No significant cyanosis, clubbing, or edema bilateral lower extremities  Data Reviewed: Basic Metabolic Panel:  Recent Labs Lab 07/27/12 0342 07/28/12 0255 07/29/12 0340 07/30/12 0513 07/31/12 0540  NA 133* 134* 136 137 137  K 3.1* 3.0* 3.6 3.6 3.1*  CL 102 103 107 104 105  CO2 18* 19 18* 18* 21  GLUCOSE 172* 175* 208* 200* 174*  BUN 11 9 8 9 11   CREATININE 0.98 0.93 0.92 0.86 0.86  CALCIUM 7.1* 7.4* 7.6* 8.2* 8.3*  MG 1.8 1.7 1.6 1.5 1.4*  PHOS 1.9* 2.1* 1.9* 1.9* 1.4*   Liver Function Tests:  Recent Labs Lab 07/24/12 1800  AST 9  ALT 10  ALKPHOS 46  BILITOT 0.4  PROT 5.8*  ALBUMIN 2.4*   CBC:  Recent Labs Lab 07/24/12 1800  07/27/12 0342 07/28/12 0255 07/29/12 0340 07/30/12 0513 07/31/12 0540  WBC 16.5*  < > 9.5 7.5 5.2 6.3 7.5  NEUTROABS 14.1*  --   --   --   --   --   --   HGB 10.6*  < > 10.1* 9.7* 9.2* 9.5* 9.0*  HCT 32.1*  < > 29.6* 28.7* 27.1* 27.8* 26.0*  MCV 84.9  < > 83.9 82.2 83.4 81.0 81.3  PLT 151  < > 131* 129* 134* 152 177  < > = values in this interval not displayed. Cardiac Enzymes:  Recent Labs Lab 07/24/12 1800 07/24/12 2140 07/25/12 0308 07/25/12 0930  TROPONINI <0.30 <0.30 <0.30 <0.30   BNP (last 3 results)  Recent Labs  07/24/12 2140  PROBNP 15551.0*   CBG:  Recent Labs Lab 07/30/12 1203 07/30/12 1703 07/30/12 2103 07/31/12 0830 07/31/12 1216  GLUCAP 238* 229* 216* 155* 247*    Recent Results (from the past 240 hour(s))  CLOSTRIDIUM DIFFICILE BY PCR     Status: Abnormal   Collection Time    07/24/12 11:53 PM      Result Value Range Status   C difficile by pcr POSITIVE (*) NEGATIVE Final   Comment: CRITICAL RESULT CALLED TO, READ BACK BY AND VERIFIED WITH:     ARNOLD,A  RN 07/25/12 1124 WOOTEN,K  MRSA PCR SCREENING     Status: None   Collection Time    07/25/12  2:10 AM      Result Value Range Status   MRSA by PCR NEGATIVE  NEGATIVE Final   Comment:            The GeneXpert MRSA Assay (FDA     approved for NASAL specimens     only), is one component of a     comprehensive MRSA colonization     surveillance program. It is not     intended to diagnose MRSA     infection nor to guide or     monitor treatment for     MRSA infections.  OVA AND PARASITE EXAMINATION     Status: None   Collection Time    07/25/12  6:52 AM      Result Value Range Status  Specimen Description PERIRECTAL   Final   Special Requests NONE   Final   Ova and parasites NO OVA OR PARASITES SEEN   Final   Report Status 07/28/2012 FINAL   Final  URINE CULTURE     Status: None   Collection Time    07/25/12  2:08 PM      Result Value Range Status   Specimen Description URINE, CATHETERIZED   Final   Special Requests NONE   Final   Culture  Setup Time 07/25/2012 20:19   Final   Colony Count NO GROWTH   Final   Culture NO GROWTH   Final   Report Status 07/26/2012 FINAL   Final     Studies:  Recent x-ray studies have been reviewed in detail by the Attending Physician  Scheduled Meds:  Reviewed in detail by the Attending Physician   [facility]  [facility] Office  [phone] Pager - Text Page per Amion as per below:  On-Call/Text Page:      [email or web address]      password [email or web address]  If 7PM-7AM, please contact night-coverage [email or web address] Password [facility] 07/31/2012, 3:53 PM   LOS: 7 days

## 2012-08-01 DIAGNOSIS — N400 Enlarged prostate without lower urinary tract symptoms: Secondary | ICD-10-CM

## 2012-08-01 LAB — BASIC METABOLIC PANEL
BUN: 12 mg/dL (ref 6–23)
CO2: 25 mEq/L (ref 19–32)
Calcium: 8.3 mg/dL — ABNORMAL LOW (ref 8.4–10.5)
Creatinine, Ser: 0.91 mg/dL (ref 0.50–1.35)
GFR calc non Af Amer: 81 mL/min — ABNORMAL LOW (ref >90)
Glucose, Bld: 155 mg/dL — ABNORMAL HIGH (ref 70–99)

## 2012-08-01 LAB — GLUCOSE, CAPILLARY
Glucose-Capillary: 192 mg/dL — ABNORMAL HIGH (ref 70–99)
Glucose-Capillary: 212 mg/dL — ABNORMAL HIGH (ref 70–99)

## 2012-08-01 LAB — PROTIME-INR: Prothrombin Time: 34.5 seconds — ABNORMAL HIGH (ref 11.6–15.2)

## 2012-08-01 MED ORDER — MAGNESIUM SULFATE 40 MG/ML IJ SOLN
2.0000 g | Freq: Once | INTRAMUSCULAR | Status: AC
  Administered 2012-08-01: 2 g via INTRAVENOUS
  Filled 2012-08-01: qty 50

## 2012-08-01 MED ORDER — BETHANECHOL CHLORIDE 5 MG PO TABS
5.0000 mg | ORAL_TABLET | Freq: Three times a day (TID) | ORAL | Status: DC
  Administered 2012-08-01 – 2012-08-03 (×6): 5 mg via ORAL
  Filled 2012-08-01 (×8): qty 1

## 2012-08-01 NOTE — Progress Notes (Signed)
ANTICOAGULATION CONSULT NOTE - Follow Up Consult  Pharmacy Consult for Coumadin Indication: atrial fibrillation  No Known Allergies  Patient Measurements: Height: 5\' 10"  (177.8 cm) Weight: 196 lb 6.9 oz (89.1 kg) IBW/kg (Calculated) : 73 Heparin Dosing Weight:   Vital Signs: Temp: 97.6 F (36.4 C) (02/23 0500) Temp src: Oral (02/23 0500) BP: 123/63 mmHg (02/23 0500) Pulse Rate: 97 (02/23 0833)  Labs:  Recent Labs  07/29/12 1426 07/30/12 0513 07/31/12 0540 08/01/12 0610  HGB  --  9.5* 9.0*  --   HCT  --  27.8* 26.0*  --   PLT  --  152 177  --   LABPROT  --  23.4* 33.0* 34.5*  INR  --  2.19* 3.48* 3.70*  HEPARINUNFRC 0.49 0.47 0.86*  --   CREATININE  --  0.86 0.86 0.91    Estimated Creatinine Clearance: 78.8 ml/min (by C-G formula based on Cr of 0.91).  Assessment: 77yom on Coumadin for new onset Afib. Heparin was discontinued on 2/22 due to elevated INR (>3). INR (3.7) remains supratherapeutic and continues to trend up despite holding Coumadin.  - No CBC this AM - No significant bleeding reported per RN  Goal of Therapy:  INR 2-3   Plan:  1. No Coumadin tonight 2. Follow-up AM INR 3. Monitor for s/sx of bleeding (patient also on ASA and Brilinta for stent)  Brandon Clay [phone] 08/01/2012,9:10 AM

## 2012-08-01 NOTE — Progress Notes (Signed)
Patient ID: Brandon Clay, male   DOB: 06-30-1934, 78 y.o.   MRN: [id number]    SUBJECTIVE: Improving a bit every day    BP 123/63  Pulse 97  Temp(Src) 97.6 F (36.4 C) (Oral)  Resp 18  Ht 5\' 10"  (1.778 m)  Wt 196 lb 6.9 oz (89.1 kg)  BMI 28.18 kg/m2  SpO2 91%  Intake/Output Summary (Last 24 hours) at 08/01/12 1111 Last data filed at 08/01/12 0700  Gross per 24 hour  Intake    250 ml  Output    925 ml  Net   -675 ml    PHYSICAL EXAM General: Well developed, well nourished, in no acute distress. Alert and oriented x 3.  Psych:  Good affect, responds appropriately Neck: No JVD. No masses noted.  Lungs: Clear bilaterally with no wheezes or rhonci noted.  Heart: Irreg irreg Abdomen: Bowel sounds are present. Soft, non-tender.  Extremities: No lower extremity edema.   LABS: Basic Metabolic Panel:  Recent Labs  38/75/64 0513 07/31/12 0540 07/31/12 1738 08/01/12 0610  NA 137 137  --  134*  K 3.6 3.1*  --  3.7  CL 104 105  --  102  CO2 18* 21  --  25  GLUCOSE 200* 174*  --  155*  BUN 9 11  --  12  CREATININE 0.86 0.86  --  0.91  CALCIUM 8.2* 8.3*  --  8.3*  MG 1.5 1.4*  --   --   PHOS 1.9* 1.4* 1.5*  --    CBC:  Recent Labs  07/30/12 0513 07/31/12 0540  WBC 6.3 7.5  HGB 9.5* 9.0*  HCT 27.8* 26.0*  MCV 81.0 81.3  PLT 152 177   Current Meds: . aspirin EC  81 mg Oral QODAY  . budesonide-formoterol  2 puff Inhalation BID  . diltiazem  60 mg Oral Q8H  . finasteride  5 mg Oral q morning - 10a  . insulin aspart  0-9 Units Subcutaneous TID WC  . insulin detemir  18 Units Subcutaneous QHS  . metoprolol tartrate  100 mg Oral BID  . phosphorus  250 mg Oral BID  . potassium chloride  40 mEq Oral BID  . sodium chloride  3 mL Intravenous Q12H  . Tamsulosin HCl  0.4 mg Oral q morning - 10a  . Ticagrelor  90 mg Oral BID  . vancomycin  125 mg Oral Q6H  . Warfarin - Pharmacist Dosing Inpatient   Does not apply q1800     ASSESSMENT AND PLAN: 78 yo male with  history of CAD with bare metal stent placement Circumflex artery 07/12/12 on ASA and Brilinta (plan for one month ending 08/09/12), newly diagnosed squamous cell lung cancer (awaiting lobectomy in March 2014 per Dr. [name]) admitted with C. Diff colitis, dehydration, atrial fibrillation with RVR.   1. New onset atrial fibrillation: In setting of dehydration/sepsis with C. Diff colitis.  Continue metoprolol 100 mg po BID today. INR supraRx none today On PO cardizem now  2. Coronary artery disease: recent bare-metal stent placement Circumflex artery 07/12/12. Continue ASA and Brilinta.   3. C. Diff colitis with significant diarrhea: Improving. Treatment per primary team.   4. Hyperlipidemia with statin intolerance   5. History of squamous cell lung cancer: Will need lobectomy in March when colitis cleared. He has seen Dr. [name].   6. Hypokalemia: Replace this am.   7. Urinary retention: He has had issues last two days with retention and has required in/out  cath and now foley. May need to get urology consult to address.   Will sign off       Charlton Haws  2/23/201411:11 AM

## 2012-08-01 NOTE — Progress Notes (Signed)
[facility] Progress Note    Brandon Clay UJW:[id number] DOB: 07-Nov-1934 DOA: 07/24/2012 PCP: [name], MD  Brief narrative: 78 yo male with multiple and complex medical history including CAD 3 vessels w/ chronic occlusion of RCA severe stenosis prox LCx & moderately severe dz in mid LAD, S/P PTCA/BMS to prox LCx, DAPT with ASA and Brilinta x1 month (Plavix nonresponder), LUL squamous lung cancer with planned lobectomy by Dr. [name] after one month of DAPT, presented to to [facility] ED 07/24/2012 with main concern of persistent and ongoing diarrhea of 2 days in durations, associated with crampy abdominal pain in the epigastric area, one episode of non bloody vomiting, subjective fevers, chills, poor oral intake. Patient was subsequently found to have C. difficile colitis.   In ED, pt found to be in atrial fibrillation.   Cardiology evaluated the patient [facility] and ultimately he was transferred to [facility] on 07/29/2012 to undergo TEE [facility].  Unfortunately this procedure failed as the patient converted back to atrial fibrillation within a few hours of the procedure.  He was therefore kept at [facility] and transferred to the step down unit for ongoing titration of Cardizem drip.  At this time the patient has been liberated from his Cardizem drip.  Treatment for his C. difficile colitis continues.  Clinically he appears to be stabilizing.  The plan at this time is to transfer him to a telemetry bed and to observe him over the next 48 hours with potential discharge on Monday or Tuesday as clinical stability allows.    Assessment/Plan:  Diarrhea, vomiting, abdominal pain - C diff colitis oral Vancomycin continues  - Flagyl was discontinued due to its tendency to interfere with Coumadin dosing   Atrial fibrillation with RVR  Management as per cardiology - failed TEE [facility] 07/29/2012 - rate now well controlled with oral medications   Hypokalemia /  hypomagnesemia/ hypophosphatemia: Secondary to GI losses, diarrhea - increase replacement and follow trend   Hypertension  Well-controlled at the present time  Hypercholesterolemia  statin intolerant   Diabetes, type II and with complications  Hemoglobin A1c 7.4 this admission - Metformin held secondary to C. difficile colitis - continue sliding scale insulin - adjust treatment today as CBG is drifting upward . CBG (last 3)   Recent Labs  07/31/12 2123 08/01/12 0741 08/01/12 1132  GLUCAP 249* 131* 173*      Squamous cell lung cancer left upper lobe  scheduled for planned lobectomy by Dr. [name] in 1 month after aspirin and DAPT  COPD (chronic obstructive pulmonary disease)  clinically stable - continue Symbicort   CAD (coronary artery disease), native coronary artery  Care as per Cardiology  Anemia of chronic disease  stable Hgb and Hct and at pt's baseline   Thrombocytopenia  Stable at this time  Benign prostatic hypertrophy  continue Finasteride and Flomax -  begin voiding trials and again - WILL START URECHOLINE today.    Code Status: FULL Family Communication: discussed plan with the patient .none at bedside.  Disposition Plan:possibly home when diarrhea improves.  Consultants: Cardiology Electrophysiology Thoracic surgery  Procedures: 07/29/2012 - TEE with [facility]  Antibiotics: Vancomycin 2/16>> Flagyl 2/16>>2/21  DVT prophylaxis: Fully anticoagulated  HPI/Subjective: The patient is awake alert and conversant.  He denies chest pain fevers chills nausea or vomiting.  Objective: Blood pressure 123/63, pulse 97, temperature 97.6 F (36.4 C), temperature source Oral, resp. rate 18, height 5\' 10"  (1.778 m), weight 89.1 kg (196 lb  6.9 oz), SpO2 91.00%.  Intake/Output Summary (Last 24 hours) at 08/01/12 0849 Last data filed at 08/01/12 0700  Gross per 24 hour  Intake    280 ml  Output    925 ml  Net   -645 ml    Exam: General: No acute  respiratory distress Lungs: Clear to auscultation bilaterally without wheezes or crackles Cardiovascular: Rate is approximately 90 beats per minute without appreciable gallop or rub Abdomen: Nontender, nondistended, soft, bowel sounds positive, no rebound, no ascites, no appreciable mass Extremities: No significant cyanosis, clubbing, or edema bilateral lower extremities  Data Reviewed: Basic Metabolic Panel:  Recent Labs Lab 07/27/12 0342 07/28/12 0255 07/29/12 0340 07/30/12 0513 07/31/12 0540 07/31/12 1738 08/01/12 0610  NA 133* 134* 136 137 137  --  134*  K 3.1* 3.0* 3.6 3.6 3.1*  --  3.7  CL 102 103 107 104 105  --  102  CO2 18* 19 18* 18* 21  --  25  GLUCOSE 172* 175* 208* 200* 174*  --  155*  BUN 11 9 8 9 11   --  12  CREATININE 0.98 0.93 0.92 0.86 0.86  --  0.91  CALCIUM 7.1* 7.4* 7.6* 8.2* 8.3*  --  8.3*  MG 1.8 1.7 1.6 1.5 1.4*  --   --   PHOS 1.9* 2.1* 1.9* 1.9* 1.4* 1.5*  --    Liver Function Tests: No results found for this basename: AST, ALT, ALKPHOS, BILITOT, PROT, ALBUMIN,  in the last 168 hours CBC:  Recent Labs Lab 07/27/12 0342 07/28/12 0255 07/29/12 0340 07/30/12 0513 07/31/12 0540  WBC 9.5 7.5 5.2 6.3 7.5  HGB 10.1* 9.7* 9.2* 9.5* 9.0*  HCT 29.6* 28.7* 27.1* 27.8* 26.0*  MCV 83.9 82.2 83.4 81.0 81.3  PLT 131* 129* 134* 152 177   Cardiac Enzymes:  Recent Labs Lab 07/25/12 0930  TROPONINI <0.30   BNP (last 3 results)  Recent Labs  07/24/12 2140  PROBNP 15551.0*   CBG:  Recent Labs Lab 07/31/12 0830 07/31/12 1216 07/31/12 1759 07/31/12 2123 08/01/12 0741  GLUCAP 155* 247* 224* 249* 131*    Recent Results (from the past 240 hour(s))  CLOSTRIDIUM DIFFICILE BY PCR     Status: Abnormal   Collection Time    07/24/12 11:53 PM      Result Value Range Status   C difficile by pcr POSITIVE (*) NEGATIVE Final   Comment: CRITICAL RESULT CALLED TO, READ BACK BY AND VERIFIED WITH:     ARNOLD,A RN 07/25/12 1124 WOOTEN,K  MRSA PCR  SCREENING     Status: None   Collection Time    07/25/12  2:10 AM      Result Value Range Status   MRSA by PCR NEGATIVE  NEGATIVE Final   Comment:            The GeneXpert MRSA Assay (FDA     approved for NASAL specimens     only), is one component of a     comprehensive MRSA colonization     surveillance program. It is not     intended to diagnose MRSA     infection nor to guide or     monitor treatment for     MRSA infections.  OVA AND PARASITE EXAMINATION     Status: None   Collection Time    07/25/12  6:52 AM      Result Value Range Status   Specimen Description PERIRECTAL   Final   Special Requests NONE  Final   Ova and parasites NO OVA OR PARASITES SEEN   Final   Report Status 07/28/2012 FINAL   Final  URINE CULTURE     Status: None   Collection Time    07/25/12  2:08 PM      Result Value Range Status   Specimen Description URINE, CATHETERIZED   Final   Special Requests NONE   Final   Culture  Setup Time 07/25/2012 20:19   Final   Colony Count NO GROWTH   Final   Culture NO GROWTH   Final   Report Status 07/26/2012 FINAL   Final       [facility]  [facility] Office  [phone] Pager - Text Page per Loretha Stapler as per below:  On-Call/Text Page:      [email or web address]      password [email or web address]  If 7PM-7AM, please contact night-coverage [email or web address] Password [facility] 08/01/2012, 8:49 AM   LOS: 8 days

## 2012-08-01 NOTE — Progress Notes (Signed)
Pt voiding small amounts (100cc per occurrence), bladder scan done at 1855, 200 cc remaining post void, will continue to monitor

## 2012-08-01 NOTE — Progress Notes (Signed)
Pt c/o of inability to void since 2300. Pt had foley d/ced yesterday and voided 300cc at 2300 and none since. Pt bladder scanned for >200cc in bladder. Benedetto Coons NP made aware and orders received. Pt I and O cathed for of dark yellow urine. Pt tolerated well. Will cont to monitor and let day shift know to monitor.

## 2012-08-02 ENCOUNTER — Encounter: Payer: Self-pay | Admitting: Radiation Oncology

## 2012-08-02 ENCOUNTER — Ambulatory Visit
Admit: 2012-08-02 | Discharge: 2012-08-02 | Disposition: A | Discharge status: Home / Self Care | Payer: Medicare Other | Attending: Radiation Oncology | Admitting: Radiation Oncology

## 2012-08-02 DIAGNOSIS — C3490 Malignant neoplasm of unspecified part of unspecified bronchus or lung: Secondary | ICD-10-CM

## 2012-08-02 DIAGNOSIS — I2510 Atherosclerotic heart disease of native coronary artery without angina pectoris: Secondary | ICD-10-CM

## 2012-08-02 DIAGNOSIS — C3492 Malignant neoplasm of unspecified part of left bronchus or lung: Secondary | ICD-10-CM

## 2012-08-02 LAB — BASIC METABOLIC PANEL
BUN: 10 mg/dL (ref 6–23)
Calcium: 8.6 mg/dL (ref 8.4–10.5)
Creatinine, Ser: 0.97 mg/dL (ref 0.50–1.35)
GFR calc non Af Amer: 79 mL/min — ABNORMAL LOW (ref >90)
Glucose, Bld: 96 mg/dL (ref 70–99)

## 2012-08-02 LAB — GLUCOSE, CAPILLARY
Glucose-Capillary: 104 mg/dL — ABNORMAL HIGH (ref 70–99)
Glucose-Capillary: 233 mg/dL — ABNORMAL HIGH (ref 70–99)

## 2012-08-02 LAB — CBC
HCT: 25 % — ABNORMAL LOW (ref 39.0–52.0)
Hemoglobin: 8.4 g/dL — ABNORMAL LOW (ref 13.0–17.0)
RBC: 3.06 MIL/uL — ABNORMAL LOW (ref 4.22–5.81)
WBC: 9.1 10*3/uL (ref 4.0–10.5)

## 2012-08-02 LAB — MAGNESIUM: Magnesium: 1.7 mg/dL (ref 1.5–2.5)

## 2012-08-02 LAB — PROTIME-INR
INR: 2.63 — ABNORMAL HIGH (ref 0.00–1.49)
Prothrombin Time: 26.8 seconds — ABNORMAL HIGH (ref 11.6–15.2)

## 2012-08-02 MED ORDER — SODIUM GLYCEROPHOSPHATE 1 MMOLE/ML IV SOLN
20.0000 mmol | Freq: Once | INTRAVENOUS | Status: AC
  Administered 2012-08-02: 20 mmol via INTRAVENOUS
  Filled 2012-08-02 (×4): qty 20

## 2012-08-02 MED ORDER — DEXTROSE 5 % IV SOLN: 30.0000 mmol | Freq: Once | INTRAVENOUS | Status: DC

## 2012-08-02 MED ORDER — SODIUM GLYCEROPHOSPHATE 1 MMOLE/ML IV SOLN
10.0000 mmol | Freq: Once | INTRAVENOUS | Status: DC
  Filled 2012-08-02: qty 10

## 2012-08-02 MED ORDER — WARFARIN SODIUM 3 MG PO TABS
3.0000 mg | ORAL_TABLET | Freq: Once | ORAL | Status: AC
  Administered 2012-08-02: 3 mg via ORAL
  Filled 2012-08-02: qty 1

## 2012-08-02 NOTE — Care Management Note (Signed)
    Page 1 of 2   08/03/2012     11:36:41 AM   CARE MANAGEMENT NOTE 08/03/2012  Patient:  MARTHA, SOLTYS   Account Number:  [id number]  Date Initiated:  07/25/2012  Documentation initiated by:  [facility]  Subjective/Objective Assessment:   78 year old male admitted with diarrhea and Afib w/ RVR.     Action/Plan:   Lived at home iwth spouse PTA.   Anticipated DC Date:  07/31/2012   Anticipated DC Plan:  HOME/SELF CARE      DC Planning Services  CM consult      PAC Choice  Resumption Of Svcs/PTA Provider   Choice offered to / List presented to:  C-3 Spouse        HH arranged  HH-1 RN  HH-10 DISEASE MANAGEMENT  HH-2 PT  HH-3 OT      HH agency  [facility]   Status of service:  Completed, signed off Medicare Important Message given?  NA - LOS <3 / Initial given by admissions (If response is "NO", the following Medicare IM given date fields will be blank) Date Medicare IM given:   Date Additional Medicare IM given:    Discharge Disposition:  HOME W HOME HEALTH SERVICES  Per UR Regulation:  Reviewed for med. necessity/level of care/duration of stay  If discussed at Long Length of Stay Meetings, dates discussed:   08/03/2012    Comments:  08-03-12 1135 Tomi Bamberger, [place] [phone] CM did fax information to [facility] services. SOC to begin within 24-48 hours post d/c.   08-02-12 1413 Tomi Bamberger, RN,BSN [phone] CM did speak to pt's Lyda Perone and pt is active with [facility] services. Pt/wife agreeable to [facility] services. Will need resumption order for services and CM will make referral. Will f/u once referral made.  [id number] Earlene Plater, RN, BSN, CCM:  CHART REVIEWED AND UPDATED.  Next chart review due on [id number]. NO DISCHARGE NEEDS PRESENT AT THIS TIME. CASE MANAGEMENT [phone]

## 2012-08-02 NOTE — Progress Notes (Signed)
Physical Therapy Treatment Patient Details Name: Brandon Clay MRN: [id number] DOB: 05/04/1935 Today's Date: 08/02/2012 Time: 7846-9629 PT Time Calculation (min): 16 min  PT Assessment / Plan / Recommendation Comments on Treatment Session  Pt on caseload already, and received PT orders for d/c planning.  Pt would like to go home (if he does go home will need HHPT), but PT feels pt may benefit from CIR to work towards improving safety with RW and for continued monitoring of vitals with increased mobility.   Per chart, pt scheduled for lobectomy in [facility] and CIR would assist in getting pt as strong as possible prior to surgery.  HR was 115 prior to gait, and increased into 130s during gait.  It decreased upon rest down into 80s and then jumped back into 120s.  Nursing informed.      Follow Up Recommendations  Home health PT;CIR     Does the patient have the potential to tolerate intense rehabilitation     Barriers to Discharge        Equipment Recommendations  None recommended by PT    Recommendations for Other Services    Frequency Min 3X/week   Plan Frequency remains appropriate;Discharge plan needs to be updated    Precautions / Restrictions Precautions Precautions: Fall Precaution Comments: watch HR and O2   Pertinent Vitals/Pain No c/o pain.    Mobility  Bed Mobility Supine to Sit: 6: Modified independent (Device/Increase time) Sit to Supine: 6: Modified independent (Device/Increase time) Transfers Sit to Stand: 5: Supervision Stand to Sit: 5: Supervision Ambulation/Gait Ambulation/Gait Assistance: 4: Min guard;4: Min assist Ambulation Distance (Feet): 135 Feet Assistive device: Rolling walker Ambulation/Gait Assistance Details: Keeps RW too far away and poor safety with it during turning. Gait Pattern: Step-through pattern    Exercises     PT Diagnosis:    PT Problem List:   PT Treatment Interventions:     PT Goals Acute Rehab PT Goals PT Goal: Supine/Side  to Sit - Progress: Progressing toward goal PT Goal: Sit to Supine/Side - Progress: Progressing toward goal PT Goal: Sit to Stand - Progress: Progressing toward goal PT Goal: Stand to Sit - Progress: Progressing toward goal PT Goal: Ambulate - Progress: Progressing toward goal  Visit Information  Last PT Received On: 08/02/12 Assistance Needed: +1    Subjective Data  Subjective: I've been here a while.   Cognition  Cognition Overall Cognitive Status: Appears within functional limits for tasks assessed/performed Arousal/Alertness: Awake/alert Orientation Level: Appears intact for tasks assessed Behavior During Session: [facility] for tasks performed Cognition - Other Comments: Pt appeared to have some confusion re: day and night today.    Balance     End of Session PT - End of Session Equipment Utilized During Treatment: Gait belt Activity Tolerance: Patient tolerated treatment well Patient left: in chair;with nursing in room Nurse Communication: Mobility status;Other (comment) (vitals)   GP     Brandon Clay 08/02/2012, 12:04 PM

## 2012-08-02 NOTE — Progress Notes (Signed)
[facility] Progress Note    Brandon Clay:[id number] DOB: 12/14/1934 DOA: 07/24/2012 PCP: [name], MD  Brief narrative: 78 yo male with multiple and complex medical history including CAD 3 vessels w/ chronic occlusion of RCA severe stenosis prox LCx & moderately severe dz in mid LAD, S/P PTCA/BMS to prox LCx, DAPT with ASA and Brilinta x1 month (Plavix nonresponder), LUL squamous lung cancer with planned lobectomy by Dr. [name] after one month of DAPT, presented to to [facility] ED 07/24/2012 with main concern of persistent and ongoing diarrhea of 2 days in durations, associated with crampy abdominal pain in the epigastric area, one episode of non bloody vomiting, subjective fevers, chills, poor oral intake. Patient was subsequently found to have C. difficile colitis.   In ED, pt found to be in atrial fibrillation.   Cardiology evaluated the patient [facility] and ultimately he was transferred to [facility] on 07/29/2012 to undergo TEE [facility].  Unfortunately this procedure failed as the patient converted back to atrial fibrillation within a few hours of the procedure.  He was therefore kept at [facility] and transferred to the step down unit for ongoing titration of Cardizem drip.  At this time the patient has been liberated from his Cardizem drip.  Treatment for his C. difficile colitis continues.  Clinically he appears to be stabilizing.  The plan at this time is to transfer him to a telemetry bed and to observe him over the next 48 hours with potential discharge on Monday or Tuesday as clinical stability allows.    Assessment/Plan:  Diarrhea, vomiting, abdominal pain - C diff colitis: oral Vancomycin continues  - Flagyl was discontinued due to its tendency to interfere with Coumadin dosing   Atrial fibrillation with RVR  Management as per cardiology - failed TEE [facility] 07/29/2012 - rate now well controlled with oral medications   Hypokalemia /  hypomagnesemia/ hypophosphatemia: Secondary to GI losses, diarrhea - increase replacement and follow trend   Hypertension  Well-controlled at the present time  Hypercholesterolemia  statin intolerant   Diabetes, type II and with complications  Hemoglobin A1c 7.4 this admission - Metformin held secondary to C. difficile colitis - continue sliding scale insulin - adjust treatment today as CBG is drifting upward . CBG (last 3)   Recent Labs  08/01/12 2055 08/02/12 0730 08/02/12 1133  GLUCAP 212* 104* 286*    We will add small dose of pre meal coverage if his cbg's remain greater than 250 in the next 24 hours.   Squamous cell lung cancer left upper lobe  scheduled for planned lobectomy by Dr. [name] in 1 month after aspirin and DAPT. Noted Dr Dennie Maizes note today, regarding his multiple medical issues preventing his goal of surgical resection. Evaluation for alternative treatment in progress.   COPD (chronic obstructive pulmonary disease)  clinically stable - continue Symbicort   CAD (coronary artery disease), native coronary artery  Care as per Cardiology  Anemia of chronic disease  stable Hgb and Hct and at pt's baseline   Thrombocytopenia  Stable at this time  Benign prostatic hypertrophy  continue Finasteride and Flomax -  begin voiding trials and again - started him on urecholine on 2/23.   Code Status: FULL Family Communication: discussed plan with the patient .none at bedside.  Disposition Plan:possibly home when diarrhea improves.  Consultants: Cardiology Electrophysiology Thoracic surgery  Procedures: 07/29/2012 - TEE with [facility]  Antibiotics: Vancomycin 2/16>> Flagyl 2/16>>2/21  DVT prophylaxis: Fully anticoagulated  HPI/Subjective:  The patient is awake alert and conversant.  He denies chest pain fevers chills nausea or vomiting.  Objective: Blood pressure 126/78, pulse 85, temperature 97.9 F (36.6 C), temperature source Oral, resp. rate 18,  height 5\' 10"  (1.778 m), weight 87.7 kg (193 lb 5.5 oz), SpO2 100.00%.  Intake/Output Summary (Last 24 hours) at 08/02/12 1651 Last data filed at 08/02/12 1300  Gross per 24 hour  Intake    720 ml  Output   1275 ml  Net   -555 ml    Exam: General: No acute respiratory distress Lungs: Clear to auscultation bilaterally without wheezes or crackles Cardiovascular: Regular rate and rhythm,  without appreciable gallop or rub Abdomen: Nontender, nondistended, soft, bowel sounds positive, no rebound, no ascites, no appreciable mass Extremities: No significant cyanosis, clubbing, or edema bilateral lower extremities  Data Reviewed: Basic Metabolic Panel:  Recent Labs Lab 07/29/12 0340 07/30/12 0513 07/31/12 0540 07/31/12 1738 08/01/12 0500 08/01/12 0610 08/02/12 0700  NA 136 137 137  --   --  134* 137  K 3.6 3.6 3.1*  --   --  3.7 4.2  CL 107 104 105  --   --  102 103  CO2 18* 18* 21  --   --  25 23  GLUCOSE 208* 200* 174*  --   --  155* 96  BUN 8 9 11   --   --  12 10  CREATININE 0.92 0.86 0.86  --   --  0.91 0.97  CALCIUM 7.6* 8.2* 8.3*  --   --  8.3* 8.6  MG 1.6 1.5 1.4*  --  1.3*  --  1.7  PHOS 1.9* 1.9* 1.4* 1.5* 1.3*  --  2.3   Liver Function Tests: No results found for this basename: AST, ALT, ALKPHOS, BILITOT, PROT, ALBUMIN,  in the last 168 hours CBC:  Recent Labs Lab 07/28/12 0255 07/29/12 0340 07/30/12 0513 07/31/12 0540 08/02/12 0700  WBC 7.5 5.2 6.3 7.5 9.1  HGB 9.7* 9.2* 9.5* 9.0* 8.4*  HCT 28.7* 27.1* 27.8* 26.0* 25.0*  MCV 82.2 83.4 81.0 81.3 81.7  PLT 129* 134* 152 177 186   Cardiac Enzymes: No results found for this basename: CKTOTAL, CKMB, CKMBINDEX, TROPONINI,  in the last 168 hours BNP (last 3 results)  Recent Labs  07/24/12 2140  PROBNP 15551.0*   CBG:  Recent Labs Lab 08/01/12 1132 08/01/12 1645 08/01/12 2055 08/02/12 0730 08/02/12 1133  GLUCAP 173* 192* 212* 104* 286*    Recent Results (from the past 240 hour(s))   CLOSTRIDIUM DIFFICILE BY PCR     Status: Abnormal   Collection Time    07/24/12 11:53 PM      Result Value Range Status   C difficile by pcr POSITIVE (*) NEGATIVE Final   Comment: CRITICAL RESULT CALLED TO, READ BACK BY AND VERIFIED WITH:     ARNOLD,A RN 07/25/12 1124 WOOTEN,K  MRSA PCR SCREENING     Status: None   Collection Time    07/25/12  2:10 AM      Result Value Range Status   MRSA by PCR NEGATIVE  NEGATIVE Final   Comment:            The GeneXpert MRSA Assay (FDA     approved for NASAL specimens     only), is one component of a     comprehensive MRSA colonization     surveillance program. It is not     intended to diagnose MRSA  infection nor to guide or     monitor treatment for     MRSA infections.  OVA AND PARASITE EXAMINATION     Status: None   Collection Time    07/25/12  6:52 AM      Result Value Range Status   Specimen Description PERIRECTAL   Final   Special Requests NONE   Final   Ova and parasites NO OVA OR PARASITES SEEN   Final   Report Status 07/28/2012 FINAL   Final  URINE CULTURE     Status: None   Collection Time    07/25/12  2:08 PM      Result Value Range Status   Specimen Description URINE, CATHETERIZED   Final   Special Requests NONE   Final   Culture  Setup Time 07/25/2012 20:19   Final   Colony Count NO GROWTH   Final   Culture NO GROWTH   Final   Report Status 07/26/2012 FINAL   Final       Brandon Clay [phone] [facility] Office  [phone] Pager - Text Page per Loretha Stapler as per below:  On-Call/Text Page:      [email or web address]      password [email or web address]  If 7PM-7AM, please contact night-coverage [email or web address] Password [email or web address] 08/02/2012, 4:51 PM   LOS: 9 days

## 2012-08-02 NOTE — Progress Notes (Signed)
Rehab Admissions Coordinator Note:  Patient was screened by Clois Dupes for appropriateness for an Inpatient Acute Rehab Consult.  At this time, we are recommending HH with assist of family or SNF rehab. Not at a level to need intense inpt rehab at this time nor will insurance approve inpt rehab for this diagnosis.   Clois Dupes 08/02/2012, 12:16 PM  I can be reached at [phone].

## 2012-08-02 NOTE — Progress Notes (Signed)
Inpatient Diabetes Program Recommendations  AACE/ADA: New Consensus Statement on Inpatient Glycemic Control (2013)  Target Ranges:  Prepandial:   less than 140 mg/dL      Peak postprandial:   less than 180 mg/dL (1-2 hours)      Critically ill patients:  140 - 180 mg/dL   Reason for Visit: Post-prandial hyperglycemia Fasting glucose controlled with addition of Lantus. Inpatient Diabetes Program Recommendations Insulin - Basal: xxx Insulin - Meal Coverage: Please consider addiiton of meal coverage of 3 units tidwc when pt eats 50% or more. Diet: . Note: Thank you, Lenor Coffin, RN, CNS, Diabetes Coordinator [phone])

## 2012-08-02 NOTE — Progress Notes (Signed)
..                      [address]            [place] [address]          [phone]     4 Days Post-Op Procedure(s) (LRB): TRANSESOPHAGEAL ECHOCARDIOGRAM (TEE) (N/A) CARDIOVERSION (N/A)  LOS: 9 days   Subjective: Patient has been back in hospital here and and wl since 1/21 with general decline in functional status with episodes of Afib and loose stools ( c diff). Prior to this we had considered surgical resection. Patient was improving from hospital at [facility] in [facility]. His FEV1 is adequate  But diffusion capacity was reduced at 30%. Since he has had cardiac stent placed, his overall strength has decreased, he is now on o2 constantly   Objective: Vital signs in last 24 hours: Patient Vitals for the past 24 hrs:  BP Temp Temp src Pulse Resp SpO2 Weight  08/02/12 1155 - - - 115 - 100 % -  08/02/12 0746 - - - - - 98 % -  08/02/12 0615 125/60 mmHg 97.8 F (36.6 C) Oral 101 18 99 % 193 lb 5.5 oz (87.7 kg)  08/01/12 2100 125/73 mmHg 97.8 F (36.6 C) Oral 104 18 100 % -  08/01/12 2042 - - - - - 96 % -  08/01/12 1358 129/56 mmHg 97.9 F (36.6 C) Oral 60 18 98 % -    Filed Weights   07/31/12 0500 08/01/12 0500 08/02/12 0615  Weight: 198 lb 3.1 oz (89.9 kg) 196 lb 6.9 oz (89.1 kg) 193 lb 5.5 oz (87.7 kg)    Hemodynamic parameters for last 24 hours:    Intake/Output from previous day: 02/23 0701 - 02/24 0700 In: 600 [P.O.:600] Out: 1525 [Urine:1525] Intake/Output this shift: Total I/O In: 120 [P.O.:120] Out: -   Scheduled Meds: . aspirin EC  81 mg Oral QODAY  . bethanechol  5 mg Oral TID  . budesonide-formoterol  2 puff Inhalation BID  . diltiazem  60 mg Oral Q8H  . finasteride  5 mg Oral q morning - 10a  . insulin aspart  0-9 Units Subcutaneous TID WC  . insulin detemir  18 Units Subcutaneous QHS  . metoprolol tartrate  100 mg Oral BID  . potassium chloride  40 mEq Oral BID  . sodium chloride  3 mL Intravenous Q12H  . sodium glycerophosphate  0.9% NaCl IVPB  20 mmol Intravenous Once   Followed by  . sodium glycerophosphate 0.9% NaCl IVPB  10 mmol Intravenous Once  . Tamsulosin HCl  0.4 mg Oral q morning - 10a  . Ticagrelor  90 mg Oral BID  . vancomycin  125 mg Oral Q6H  . warfarin  3 mg Oral ONCE-1800  . Warfarin - Pharmacist Dosing Inpatient   Does not apply q1800   Continuous Infusions:  PRN Meds:.HYDROcodone-acetaminophen, metoprolol, ondansetron (ZOFRAN) IV, ondansetron  General appearance: alert, cooperative and appears older than stated age Neurologic: intact Heart: regular rate and rhythm, S1, S2 normal, no murmur, click, rub or gallop and normal apical impulse Lungs: diminished breath sounds anterior - bilateral Abdomen: soft, non-tender; bowel sounds normal; no masses,  no organomegaly Extremities: extremities normal, atraumatic, no cyanosis or edema and Homans sign is negative, no sign of DVT  Lab Results: CBC: Recent Labs  07/31/12 0540 08/02/12 0700  WBC 7.5 9.1  HGB 9.0* 8.4*  HCT 26.0* 25.0*  PLT 177  186   BMET:  Recent Labs  07/31/12 0540 08/01/12 0610  NA 137 134*  K 3.1* 3.7  CL 105 102  CO2 21 25  GLUCOSE 174* 155*  BUN 11 12  CREATININE 0.86 0.91  CALCIUM 8.3* 8.3*    PT/INR:  Recent Labs  08/02/12 0700  LABPROT 26.8*  INR 2.63*     Radiology No results found.   Assessment/Plan: S/P Procedure(s) (LRB): TRANSESOPHAGEAL ECHOCARDIOGRAM (TEE) (N/A) CARDIOVERSION (N/A) Consider alternative treatment for left lung ca, as patient has had multiple health issues that are preventing goal of surgical resection. I am concerend he will never be strong for  resection, I have discussed  With Radiation Oncology to evaluate for alternative treatment.   Delight Ovens MD 08/02/2012 12:17 PM

## 2012-08-02 NOTE — Progress Notes (Signed)
Pt had 25 beats of Vtach/SVT. Pt was asymptomatic. Pt voiding at the time. Lenny Pastel NP notified and orders received. Will give pt mag and continue to monitor.

## 2012-08-02 NOTE — Evaluation (Signed)
Occupational Therapy Evaluation Patient Details Name: Brandon Clay MRN: [id number] DOB: May 16, 1935 Today's Date: 08/02/2012 Time: 4132-4401 OT Time Calculation (min): 23 min  OT Assessment / Plan / Recommendation Clinical Impression  Pt is a 21 male w/ C-Diff, A-fib, lung Ca. He presents with fluctuations in HR w/ activity (80's-130's throughout session, RN made aware). He has deficits w/ ADL's and self care and plans to return home with PRN family assist. Recommend acute OT followed by [facility] to assist with maximizing independence in ADL's and self care tasks    OT Assessment  Patient needs continued OT Services    Follow Up Recommendations  Home health OT    Barriers to Discharge      Equipment Recommendations  Other (comment) (Cont to assess for possible needs)    Recommendations for Other Services    Frequency  Min 3X/week    Precautions / Restrictions Precautions Precautions: Fall Precaution Comments: watch HR and O2 Restrictions Weight Bearing Restrictions: No   Pertinent Vitals/Pain "No" c/o pain per pt report    ADL  Eating/Feeding: Simulated;Set up Where Assessed - Eating/Feeding: Bed level Grooming: Performed;Wash/dry hands;Wash/dry face;Set up Where Assessed - Grooming: Supported sitting Upper Body Bathing: Simulated;Minimal assistance;Supervision/safety (Pt w/ changes HR w/ Min activity) Where Assessed - Upper Body Bathing: Supported sitting Lower Body Bathing: Simulated;Minimal assistance Where Assessed - Lower Body Bathing: Supported sitting;Supported sit to stand Upper Body Dressing: Performed;Set up;Supervision/safety Where Assessed - Upper Body Dressing: Unsupported sitting Lower Body Dressing: Performed;Minimal assistance Where Assessed - Lower Body Dressing: Supported sit to stand Toilet Transfer: Simulated;Minimal assistance Toilet Transfer Method: Sit to Barista: Comfort height toilet;Grab bars Toileting - Clothing  Manipulation and Hygiene: Simulated;Min guard Where Assessed - Engineer, mining and Hygiene: Sit to stand from 3-in-1 or toilet;Standing Tub/Shower Transfer Method: Not assessed Equipment Used: Gait belt;Rolling walker;Other (comment) (2L O2) Transfers/Ambulation Related to ADLs: 2LO2 during functional mobility w/ Min assist and VC's for safety, sequencing w/ RW. Pt has fluctuation with HR w/ minimal activity noted. Prior to amb 115, then increased to 130's followed by 80's-120's. RN made aware ADL Comments: Pt is a 67 male w/ C-Diff, A-fib, lung Ca. He presents with fluctuations in HR w/ activity (80's-130's throughout session, RN made aware). He has deficits w/ ADL's and self care and plans to return home with PRN family assist. Recommend acute OT followed by [facility] to assist with maximizing independence in ADL's and self care tasks.    OT Diagnosis: Generalized weakness  OT Problem List: Decreased activity tolerance;Decreased knowledge of use of DME or AE;Cardiopulmonary status limiting activity OT Treatment Interventions: Self-care/ADL training;Energy conservation;DME and/or AE instruction;Therapeutic activities;Patient/family education;Balance training   OT Goals Acute Rehab OT Goals Potential to Achieve Goals: Good  Visit Information  Last OT Received On: 08/02/12 Assistance Needed: +1    Subjective Data  Subjective: Pt asleep in bed upon OT arrival for assessment and required VC's and TC's to arouse   Prior Functioning     Home Living Lives With: Spouse Available Help at Discharge: Family Type of Home: House Home Access: Ramped entrance Home Layout: One level Bathroom Shower/Tub:  (Pt reports "handicapped tub") Bathroom Accessibility: Yes How Accessible: Accessible via walker;Accessible via wheelchair Home Adaptive Equipment: Walker - rolling;Wheelchair - manual Prior Function Level of Independence: Independent with assistive  device(s) Communication Communication: No difficulties         Vision/Perception Vision - History Baseline Vision: Wears glasses only for reading Patient Visual Report: No change  from baseline   Cognition  Cognition Overall Cognitive Status: Appears within functional limits for tasks assessed/performed Arousal/Alertness: Awake/alert Orientation Level: Appears intact for tasks assessed Behavior During Session: [facility] for tasks performed Cognition - Other Comments: Pt appeared to have some confusion re: day and night this am.    Extremity/Trunk Assessment Right Upper Extremity Assessment RUE ROM/Strength/Tone: Within functional levels;WFL for tasks assessed RUE Sensation: WFL - Light Touch RUE Coordination: WFL - gross/fine motor Left Upper Extremity Assessment LUE ROM/Strength/Tone: Within functional levels;WFL for tasks assessed LUE Sensation: WFL - Light Touch LUE Coordination: WFL - gross/fine motor     Mobility Bed Mobility Bed Mobility: Supine to Sit;Sitting - Scoot to Edge of Bed Supine to Sit: 6: Modified independent (Device/Increase time) Sitting - Scoot to Edge of Bed: 6: Modified independent (Device/Increase time) Sit to Supine: 6: Modified independent (Device/Increase time) Transfers Transfers: Sit to Stand;Stand to Sit Sit to Stand: 5: Supervision Stand to Sit: 5: Supervision Details for Transfer Assistance: Pt required cuing for hand placement and safety w/ RW during transfers        Balance Balance Balance Assessed: Yes Static Sitting Balance Static Sitting - Balance Support: Feet supported;Bilateral upper extremity supported Static Sitting - Level of Assistance: 6: Modified independent (Device/Increase time)   End of Session OT - End of Session Equipment Utilized During Treatment: Gait belt;Other (comment) (RW, portable O2) Activity Tolerance: Patient tolerated treatment well Patient left: in chair;with call bell/phone within reach;with nursing in  room;Other (comment) (RN to apply chair alarm per her report) Nurse Communication: Mobility status;Other (comment) (Need for chair alarm)  GO     Alm Bustard 08/02/2012, 1:04 PM

## 2012-08-02 NOTE — Progress Notes (Signed)
ANTICOAGULATION CONSULT NOTE - Follow Up Consult  Pharmacy Consult for Coumadin Indication: atrial fibrillation  No Known Allergies  Labs:  Recent Labs  07/31/12 0540 08/01/12 0610 08/02/12 0700  HGB 9.0*  --  8.4*  HCT 26.0*  --  25.0*  PLT 177  --  186  LABPROT 33.0* 34.5* 26.8*  INR 3.48* 3.70* 2.63*  HEPARINUNFRC 0.86*  --   --   CREATININE 0.86 0.91  --     Estimated Creatinine Clearance: 78.3 ml/min (by C-G formula based on Cr of 0.91).  Assessment: 77yom on Coumadin for new onset Afib. Heparin was discontinued on 2/22 due to elevated INR (>3). INR (2.63) now therapeutic - CBC trending down - No significant bleeding reported per RN  Goal of Therapy:  INR 2-3   Plan:  1. Coumadin 3 mg po x 1 tonight 2. Follow-up AM INR 3. Monitor for s/sx of bleeding (patient also on ASA and Brilinta for stent)  Thank you. Okey Regal, PharmD [phone]  08/02/2012,9:56 AM

## 2012-08-03 LAB — GLUCOSE, CAPILLARY
Glucose-Capillary: 106 mg/dL — ABNORMAL HIGH (ref 70–99)
Glucose-Capillary: 158 mg/dL — ABNORMAL HIGH (ref 70–99)

## 2012-08-03 LAB — PROTIME-INR
INR: 2.75 — ABNORMAL HIGH (ref 0.00–1.49)
Prothrombin Time: 27.7 seconds — ABNORMAL HIGH (ref 11.6–15.2)

## 2012-08-03 MED ORDER — INSULIN DETEMIR 100 UNIT/ML ~~LOC~~ SOLN: 18.0000 [IU] | Freq: Every day | SUBCUTANEOUS | Status: DC

## 2012-08-03 MED ORDER — WARFARIN SODIUM 3 MG PO TABS: 3.0000 mg | ORAL_TABLET | Freq: Every day | ORAL | Status: DC

## 2012-08-03 MED ORDER — DILTIAZEM HCL ER COATED BEADS 180 MG PO CP24
180.0000 mg | ORAL_CAPSULE | Freq: Every day | ORAL | Status: DC
  Administered 2012-08-03: 180 mg via ORAL
  Filled 2012-08-03: qty 1

## 2012-08-03 MED ORDER — DILTIAZEM HCL ER COATED BEADS 180 MG PO CP24: 180.0000 mg | ORAL_CAPSULE | Freq: Every day | ORAL | Status: DC

## 2012-08-03 MED ORDER — HYDROCODONE-ACETAMINOPHEN 5-325 MG PO TABS: 1.0000 | ORAL_TABLET | ORAL | Status: DC | PRN

## 2012-08-03 MED ORDER — BETHANECHOL CHLORIDE 5 MG PO TABS: 5.0000 mg | ORAL_TABLET | Freq: Three times a day (TID) | ORAL | Status: DC

## 2012-08-03 MED ORDER — LORAZEPAM 1 MG PO TABS
1.0000 mg | ORAL_TABLET | Freq: Once | ORAL | Status: DC
  Filled 2012-08-03: qty 1

## 2012-08-03 MED ORDER — VANCOMYCIN 50 MG/ML ORAL SOLUTION: 125.0000 mg | Freq: Four times a day (QID) | ORAL | Status: AC

## 2012-08-03 MED ORDER — MAGNESIUM OXIDE 400 MG PO TABS: 400.0000 mg | ORAL_TABLET | Freq: Every day | ORAL | Status: DC

## 2012-08-03 MED ORDER — METOPROLOL TARTRATE 100 MG PO TABS: 100.0000 mg | ORAL_TABLET | Freq: Two times a day (BID) | ORAL | Status: DC

## 2012-08-03 MED ORDER — WARFARIN SODIUM 3 MG PO TABS
3.0000 mg | ORAL_TABLET | Freq: Every day | ORAL | Status: DC
  Filled 2012-08-03: qty 1

## 2012-08-03 NOTE — Progress Notes (Addendum)
Time: 0100. Pt stated he was resting in bed and was voiding (150cc) and  suddenly stopped. Pt requested a bladder scan be done that showed 50 cc of urine. Pt denied any pressure, pain or the need to void at the time. However, pt stated he was concerned that he suddenly stopped urinating.  Time: 0300.  Pt was resting in bed and had a 10 beat run of v tach. Pt's underlying rhythm Afib.  Pt denied any chest pain or dizziness. V/S stable.   Md on call made aware of the above findings. New order received for PO Ativan. However, pt is currently sleeping at this time.  Will cont to monitor pt.

## 2012-08-03 NOTE — Progress Notes (Signed)
ANTICOAGULATION CONSULT NOTE - Follow Up Consult  Pharmacy Consult for Coumadin Indication: atrial fibrillation  No Known Allergies  Labs:  Recent Labs  08/01/12 0610 08/02/12 0700 08/03/12 0512  HGB  --  8.4*  --   HCT  --  25.0*  --   PLT  --  186  --   LABPROT 34.5* 26.8* 27.7*  INR 3.70* 2.63* 2.75*  CREATININE 0.91 0.97  --     Estimated Creatinine Clearance: 73.9 ml/min (by C-G formula based on Cr of 0.97).  Assessment: 77yom on Coumadin for new onset Afib. Heparin was discontinued on 2/22 due to elevated INR (>3). INR (2.75) now therapeutic - CBC trending down - No significant bleeding reported per RN  Goal of Therapy:  INR 2-3   Plan:  1. Coumadin 3 mg po daily 2. Follow-up AM INR 3. Monitor for s/sx of bleeding (patient also on ASA and Brilinta for stent)  Thank you. Okey Regal, PharmD [phone]  08/03/2012,10:09 AM

## 2012-08-04 NOTE — Discharge Summary (Signed)
Physician Discharge Summary  Brandon Clay ZOX:[id number] DOB: August 15, 1934 DOA: 07/24/2012  PCP: [name], MD  Admit date: 07/24/2012 Discharge date: 08/02/2012    Recommendations for Outpatient Follow-up:  1. Follow up with cardiology, cardio thoracic and radiation oncology as recommended.   Discharge Diagnoses:  Principal Problem:   C. difficile colitis Active Problems:   Hypertensive heart disease   Hypercholesterolemia   Type 2 diabetes mellitus with vascular disease   Benign prostatic hypertrophy   Squamous cell lung cancer left upper lobe   COPD (chronic obstructive pulmonary disease)   CAD (coronary artery disease), native coronary artery   Anemia of chronic disease   Leukocytosis   Hypokalemia   Sepsis(995.91)   Rapid atrial fibrillation   Discharge Condition: stable  Diet recommendation: low sodium diet  Filed Weights   08/01/12 0500 08/02/12 0615 08/03/12 0532  Weight: 89.1 kg (196 lb 6.9 oz) 87.7 kg (193 lb 5.5 oz) 89 kg (196 lb 3.4 oz)    History of present illness:  Pt is 78 yo male with multiple and complex medical history including CAD, 3 vessels w/ chronic occlusion of RCA, severe stenosis prox LCx & moderately severe dz in mid LAD, S/P PTCA/BMS to prox LCx, DAPT with ASA and Brilinta x1 month (Plavix nonresponder), LUL squamous lung cancer and with planned lobectomy by Dr. [name] after one month of DAPT, now presenting to [facility] ED with main concern of persistent and ongoing non watery diarrhea of 2 days in durations, associated with crampy abdominal pain in the epigastric area, one episode of non bloody vomiting, subjective fevers, chills, poor oral intake. He denies any specific chest pain, no new cough, no urinary concerns, no other systemic symptoms.  In ED, pt found to be in atrial fibrillation and HR 100-180, started on diltiazem drip and TRH asked to admit for further evaluation   Hospital Course:   Diarrhea, vomiting, abdominal pain - C  diff colitis:  oral Vancomycin continues for another 5 days to complete the course.- Flagyl was discontinued due to its tendency to interfere with Coumadin dosing . Atrial fibrillation with RVR  Management as per cardiology - failed TEE [facility] 07/29/2012 - rate now well controlled with oral medications  Hypokalemia / hypomagnesemia/ hypophosphatemia: resolved.  Secondary to GI losses, diarrhea - increased replacement and follow trend  Hypertension  Well-controlled at the present time  Hypercholesterolemia  statin intolerant  Diabetes, type II and with complications  Hemoglobin A1c 7.4 this admission - Metformin held secondary to C. difficile colitis - -CBG (last 3)   Recent Labs   08/01/12 2055  08/02/12 0730  08/02/12 1133   GLUCAP  212*  104*  286*    s.  Squamous cell lung cancer left upper lobe  scheduled for planned lobectomy by Dr. [name] in 1 month after aspirin and DAPT. Noted Dr Dennie Maizes note today, regarding his multiple medical issues preventing his goal of surgical resection. Evaluation for alternative treatment in progress. Radiation oncologist contacted and will follow in one week with scheduling radiation treatment.  COPD (chronic obstructive pulmonary disease)  clinically stable - continue Symbicort  CAD (coronary artery disease), native coronary artery  Care as per Cardiology  Anemia of chronic disease  stable Hgb and Hct and at pt's baseline  Thrombocytopenia  Stable at this time  Benign prostatic hypertrophy  continue Finasteride and Flomax -  started him on urecholine on 2/23.    Procedures: 07/29/2012 - TEE with [facility]  Antibiotics:  Vancomycin 2/16>>  Flagyl  2/16>>2/21   Consultations:  Cardiology  Radiation oncology from Dr Roselind Messier  Electrophysiology  Thoracic surgery  Discharge Exam: Ceasar Mons Vitals:   08/02/12 2143 08/03/12 0532 08/03/12 0912 08/03/12 1021  BP: 120/52 105/71  125/58  Pulse: 86 111  100  Temp: 98.1 F (36.7 C) 97.8 F (36.6  C)    TempSrc: Oral Oral    Resp: 18 18    Height:      Weight:  89 kg (196 lb 3.4 oz)    SpO2: 100% 100% 98%    General: No acute respiratory distress  Lungs: Clear to auscultation bilaterally without wheezes or crackles  Cardiovascular: Regular rate and rhythm, without appreciable gallop or rub  Abdomen: Nontender, nondistended, soft, bowel sounds positive, no rebound, no ascites, no appreciable mass  Extremities: No significant cyanosis, clubbing, or edema bilateral lower extremities    Discharge Instructions  Discharge Orders   Future Appointments Provider Department Dept Phone   08/11/2012 9:30 AM [facility] Nurse [facility] [phone]   08/11/2012 10:00 AM [name], MD [facility] [phone]   08/11/2012 12:10 PM Beatrice Lecher, PA [facility] Main Office [place]) [phone]   08/11/2012 3:30 PM Delight Ovens, MD [facility] [phone]   09/01/2012 1:30 PM Kalman Shan, MD [facility] [phone]   Future Orders Complete By Expires     Call MD for:  difficulty breathing, headache or visual disturbances  As directed     Call MD for:  persistant dizziness or light-headedness  As directed     Diet - low sodium heart healthy  As directed     Discharge instructions  As directed     Comments:      Follow up with Dr Roselind Messier in one week Follow up with Dr Eden Emms / [facility] cardiology in one week Follow up with Dr [name] as recommended.        Medication List    STOP taking these medications       carvedilol 25 MG tablet  Commonly known as:  COREG     metroNIDAZOLE 500 MG tablet  Commonly known as:  FLAGYL      TAKE these medications       aspirin EC 81 MG tablet  Take 81 mg by mouth every other day.     bethanechol 5 MG tablet  Commonly known as:  URECHOLINE  Take 1 tablet (5 mg total) by mouth 3 (three) times daily.      budesonide-formoterol 160-4.5 MCG/ACT inhaler  Commonly known as:  SYMBICORT  Inhale 2 puffs into the lungs 2 (two) times daily.     diltiazem 180 MG 24 hr capsule  Commonly known as:  CARDIZEM CD  Take 1 capsule (180 mg total) by mouth daily.     finasteride 5 MG tablet  Commonly known as:  PROSCAR  Take 5 mg by mouth every morning.     HYDROcodone-acetaminophen 5-325 MG per tablet  Commonly known as:  NORCO/VICODIN  Take 1-2 tablets by mouth every 4 (four) hours as needed.     insulin detemir 100 UNIT/ML injection  Commonly known as:  LEVEMIR  Inject 18 Units into the skin at bedtime.     magnesium oxide 400 MG tablet  Commonly known as:  MAG-OX 400  Take 1 tablet (400 mg total) by mouth daily.     metFORMIN 500 MG 24 hr tablet  Commonly known as:  GLUCOPHAGE-XR  Take 2 tablets (1,000 mg total) by mouth 2 (two) times daily. Please hold for 48hrs after heart catheterization. Resume on 07/15/12     metoprolol 100 MG tablet  Commonly known as:  LOPRESSOR  Take 1 tablet (100 mg total) by mouth 2 (two) times daily.     tamsulosin 0.4 MG Caps  Commonly known as:  FLOMAX  Take 0.4 mg by mouth every morning.     Ticagrelor 90 MG Tabs tablet  Commonly known as:  BRILINTA  Take 1 tablet (90 mg total) by mouth 2 (two) times daily.     vancomycin 50 mg/mL oral solution  Commonly known as:  VANCOCIN  Take 2.5 mLs (125 mg total) by mouth every 6 (six) hours.     warfarin 3 MG tablet  Commonly known as:  COUMADIN  Take 1 tablet (3 mg total) by mouth daily at 6 PM.           Follow-up Information   Follow up with [facility], MD On 09/01/2012. (at 115pm)    Contact information:   [address] [place] [address] [phone]       Follow up with Charlton Haws, MD. Schedule an appointment as soon as possible for a visit in 1 week.   Contact information:   [address] [place] [address] [phone]       Follow up with  [name], MD. Schedule an appointment as soon as possible for a visit in 2 weeks.   Contact information:   [address] [place] [address] [phone]       Follow up with GERHARDT,EDWARD B, MD. Schedule an appointment as soon as possible for a visit in 1 week.   Contact information:   [address] [employer] [address] [place] [address] [phone]       Follow up with [name], MD. Schedule an appointment as soon as possible for a visit in 1 week.   Contact information:   [address] [place] [address] [phone]        The results of significant diagnostics from this hospitalization (including imaging, microbiology, ancillary and laboratory) are listed below for reference.    Significant Diagnostic Studies: Dg Chest 2 View  07/24/2012  *RADIOLOGY REPORT*  Clinical Data: Atrial fibrillation.  Fever.  CHEST - 2 VIEW  Comparison: 07/04/2012 next few  Findings: There is mild cardiac enlargement.  Again noted is the left upper lobe lung mass measuring approximately 4.2 cm.  Advanced interstitial lung disease is identified bilaterally.  Unchanged mid and lower lung pulmonary opacities which could be related to chronic lung disease or multifocal infection.  IMPRESSION:  1.  Unchanged left upper lobe lung mass. 2.  Similar appearance of bilateral mid and lower lung zone opacities   Original Report Authenticated By: Signa Kell, M.D.    Ct Abdomen Pelvis W Contrast  07/25/2012  *RADIOLOGY REPORT*  Clinical Data: Lower abdominal pain, diarrhea, fever and leukocytosis.  CT ABDOMEN AND PELVIS WITH CONTRAST  Technique:  Multidetector CT imaging of the abdomen and pelvis was performed following the standard protocol during bolus administration of intravenous contrast.  Contrast: OMNIPAQUE IOHEXOL 300 MG/ML  SOLN  Comparison: Images from the PET / CT performed 06/22/2012, and abdominal ultrasound performed 08/01/2010  Findings: Small right and trace  left pleural effusions are noted, with underlying bibasilar airspace opacities.  There is suggestion of mild associated honeycombing.  A 7 mm nodule is noted at the right middle lobe (image 3 of 26); a larger 9 mm nodule is suggested at the right lung base (image 8 of 26).  These are stable from 2012 and likely benign.  Diffuse coronary artery calcifications are seen.  There is a single small calcified granuloma near the inferior tip of the liver.  The liver and spleen are otherwise unremarkable in appearance.  Prominent stones are noted layering dependently within the gallbladder; the gallbladder is otherwise unremarkable.  The pancreas and adrenal glands are within normal limits; mild focal fatty infiltration within the pancreatic head is nonspecific in appearance.  Nonspecific perinephric stranding is noted bilaterally.  A few vague areas of decreased parenchymal attenuation at the interpole region of the left kidney appear stable from 2012 and may reflect scarring.  Tiny scattered bilateral renal cysts are seen.  There is no evidence of hydronephrosis.  No renal or ureteral stones are seen.  The small bowel is unremarkable in appearance.  The stomach is within normal limits.  No acute vascular abnormalities are seen. Scattered calcification is noted along the abdominal aorta and its branches, including at the origins of both renal arteries.  Mural thrombus is noted along the distal abdominal aorta, with mild luminal narrowing.  The appendix is not definitely seen; there is no evidence of appendicitis.  There is relatively diffuse wall thickening along the entirety of the colon, with mild sparing along the proximal descending colon. This is compatible with diffuse colitis.  Surrounding soft tissue inflammation is seen, most prominent at the sigmoid colon. Underlying diverticula are noted along the sigmoid colon.  There is no evidence of perforation or abscess formation.  Mild associated presacral stranding and  trace free fluid are seen.  The bladder is mildly distended and grossly unremarkable in appearance.  The prostate remains normal in size, with minimal calcification.  No inguinal lymphadenopathy is seen.  No acute osseous abnormalities are identified.  A sclerotic focus at the right acetabulum is stable from 2012 and may reflect a large bone island.  IMPRESSION:  1.  Diffuse colitis noted involving the entirety of the colon, most prominent along the sigmoid colon, with surrounding soft tissue inflammation and trace associated free fluid.  No evidence of perforation or abscess formation. 2.  Small right and trace left pleural effusions, with underlying bibasilar airspace opacification.  Suggestion of mild associated honeycombing.  Small nodules at the right lung base are stable from 2012 and likely benign. 3.  Scattered calcification along the abdominal aorta and its branches, including at the origins of both renal arteries.  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 4.  Diffuse coronary artery calcifications seen. 5.  Cholelithiasis; gallbladder otherwise unremarkable in appearance. 6.  Left renal scarring and tiny bilateral renal cysts.   Original Report Authenticated By: Tonia Ghent, M.D.     Microbiology: No results found for this or any previous visit (from the past 240 hour(s)).   Labs: Basic Metabolic Panel:  Recent Labs Lab 07/29/12 0340 07/30/12 0513 07/31/12 0540 07/31/12 1738 08/01/12 0500 08/01/12 0610 08/02/12 0700  NA 136 137 137  --   --  134* 137  K 3.6 3.6 3.1*  --   --  3.7 4.2  CL 107 104 105  --   --  102 103  CO2 18* 18* 21  --   --  25 23  GLUCOSE 208* 200* 174*  --   --  155* 96  BUN 8 9 11   --   --  12 10  CREATININE 0.92 0.86 0.86  --   --  0.91 0.97  CALCIUM 7.6* 8.2* 8.3*  --   --  8.3* 8.6  MG 1.6 1.5 1.4*  --  1.3*  --  1.7  PHOS 1.9* 1.9* 1.4* 1.5* 1.3*  --  2.3   Liver Function Tests: No results found for  this basename: AST, ALT, ALKPHOS, BILITOT, PROT, ALBUMIN,  in the last 168 hours No results found for this basename: LIPASE, AMYLASE,  in the last 168 hours No results found for this basename: AMMONIA,  in the last 168 hours CBC:  Recent Labs Lab 07/29/12 0340 07/30/12 0513 07/31/12 0540 08/02/12 0700  WBC 5.2 6.3 7.5 9.1  HGB 9.2* 9.5* 9.0* 8.4*  HCT 27.1* 27.8* 26.0* 25.0*  MCV 83.4 81.0 81.3 81.7  PLT 134* 152 177 186   Cardiac Enzymes: No results found for this basename: CKTOTAL, CKMB, CKMBINDEX, TROPONINI,  in the last 168 hours BNP: BNP (last 3 results)  Recent Labs  07/24/12 2140  PROBNP 15551.0*   CBG:  Recent Labs Lab 08/02/12 1133 08/02/12 1637 08/02/12 2047 08/03/12 0742 08/03/12 1141  GLUCAP 286* 199* 233* 106* 158*       Signed:  Rastus Borton  [facility] 08/02/2012, 3:37 PM

## 2012-08-09 ENCOUNTER — Ambulatory Visit: Payer: Medicare Other | Admitting: Radiation Oncology

## 2012-08-09 ENCOUNTER — Ambulatory Visit: Payer: Medicare Other

## 2012-08-11 ENCOUNTER — Ambulatory Visit (INDEPENDENT_AMBULATORY_CARE_PROVIDER_SITE_OTHER): Payer: Medicare Other | Admitting: Cardiothoracic Surgery

## 2012-08-11 ENCOUNTER — Ambulatory Visit
Admission: RE | Admit: 2012-08-11 | Discharge: 2012-08-11 | Disposition: A | Discharge status: Home / Self Care | Payer: Medicare Other | Source: Ambulatory Visit | Attending: Radiation Oncology | Admitting: Radiation Oncology

## 2012-08-11 ENCOUNTER — Encounter: Payer: Self-pay | Admitting: Radiation Oncology

## 2012-08-11 ENCOUNTER — Ambulatory Visit (INDEPENDENT_AMBULATORY_CARE_PROVIDER_SITE_OTHER): Payer: Medicare Other | Admitting: Physician Assistant

## 2012-08-11 ENCOUNTER — Other Ambulatory Visit: Payer: Self-pay | Admitting: *Deleted

## 2012-08-11 ENCOUNTER — Encounter: Payer: Self-pay | Admitting: Physician Assistant

## 2012-08-11 ENCOUNTER — Encounter: Payer: Self-pay | Admitting: Cardiothoracic Surgery

## 2012-08-11 VITALS — BP 154/81 | HR 74 | Temp 97.6°F | Resp 18 | Ht 70.0 in | Wt 173.3 lb

## 2012-08-11 VITALS — BP 94/50 | HR 71 | Ht 70.0 in | Wt 174.0 lb

## 2012-08-11 DIAGNOSIS — I4891 Unspecified atrial fibrillation: Secondary | ICD-10-CM

## 2012-08-11 DIAGNOSIS — C3490 Malignant neoplasm of unspecified part of unspecified bronchus or lung: Secondary | ICD-10-CM | POA: Insufficient documentation

## 2012-08-11 DIAGNOSIS — C3410 Malignant neoplasm of upper lobe, unspecified bronchus or lung: Secondary | ICD-10-CM

## 2012-08-11 DIAGNOSIS — C3492 Malignant neoplasm of unspecified part of left bronchus or lung: Secondary | ICD-10-CM

## 2012-08-11 LAB — CBC WITH DIFFERENTIAL/PLATELET
Basophils Relative: 0.3 % (ref 0.0–3.0)
Eosinophils Relative: 0.4 % (ref 0.0–5.0)
HCT: 27.8 % — ABNORMAL LOW (ref 39.0–52.0)
Lymphs Abs: 0.8 10*3/uL (ref 0.7–4.0)
MCV: 85.7 fl (ref 78.0–100.0)
Monocytes Absolute: 0.5 10*3/uL (ref 0.1–1.0)
Monocytes Relative: 8.9 % (ref 3.0–12.0)
Neutrophils Relative %: 75.6 % (ref 43.0–77.0)
RBC: 3.24 Mil/uL — ABNORMAL LOW (ref 4.22–5.81)
WBC: 5.7 10*3/uL (ref 4.5–10.5)

## 2012-08-11 LAB — BASIC METABOLIC PANEL
Chloride: 102 mEq/L (ref 96–112)
GFR: 51.16 mL/min — ABNORMAL LOW (ref >60.00)
Potassium: 4.5 mEq/L (ref 3.5–5.1)

## 2012-08-11 MED ORDER — POTASSIUM CHLORIDE ER 10 MEQ PO TBCR: 10.0000 meq | EXTENDED_RELEASE_TABLET | Freq: Every day | ORAL | Status: DC

## 2012-08-11 MED ORDER — METOPROLOL TARTRATE 100 MG PO TABS: 50.0000 mg | ORAL_TABLET | Freq: Two times a day (BID) | ORAL | Status: DC

## 2012-08-11 MED ORDER — FUROSEMIDE 20 MG PO TABS: 20.0000 mg | ORAL_TABLET | Freq: Every day | ORAL | Status: DC

## 2012-08-11 NOTE — Progress Notes (Signed)
Complete PATIENT MEASURE OF DISTRESS worksheet submitted to social work.

## 2012-08-11 NOTE — Progress Notes (Signed)
If he is not having lung surgery can continue brillinta for another month

## 2012-08-11 NOTE — Patient Instructions (Addendum)
Increase Lasix to 40 mg daily for 2 days.  You can take 2 of the 20 mg tablets to = 40 mg.  You can start this tomorrow.  Decrease Metoprolol to 100 mg take 1/2 tablet (50 mg total) twice daily.    Start taking potassium.  You will take 2 tablets of Potassium Chloride 10 mEq (total 20 mEq) tomorrow and Friday.  Then reduce the dose back to potassium chloride 10 mEq one tablet daily.  Decrease salt.  No more than 4 grams (4000 mg) in a day.  Keep legs elevated when sitting. Weigh daily and call if:  Weight up 3 lbs in one day, increased swelling or increased dyspnea.   YOU HAVE A FOLLOW UP APPT WITH SCOTT WEAVER, [facility] ON 08/19/12 @ 8:50  LABS TODAY BMET, CBC W/DIFF, BNP  REPEAT BMET ON 08/19/12 WITH SCOTT WEAVER, PAC APPT

## 2012-08-11 NOTE — Progress Notes (Signed)
See progress note under physician encounter. 

## 2012-08-11 NOTE — Progress Notes (Signed)
Radiation Oncology         ([phone]) [phone] ________________________________  Initial outpatient Consultation  Name: Brandon Clay MRN: [id number]  Date: 08/11/2012  DOB: 1935/02/25  WU:[id number] EDWARD, MD  Delight Ovens, MD   REFERRING PHYSICIAN: Delight Ovens, MD  DIAGNOSIS: The encounter diagnosis was Squamous cell lung cancer, left. Clinical Stage I-B  HISTORY OF PRESENT ILLNESS::Brandon Clay is a 78 y.o. male who is seen out of the courtesy of Dr. [name] for an opinion concerning radiation therapy as part of management of patient's clinical stage I non-small cell lung cancer.  Several weeks ago the patient presented with pneumonia and was found to have a lesion in his left upper lung area. He underwent workup for this issue in the [place] area.  Patient underwent biopsy of this lesion revealing squamous cell carcinoma. PET scan showed activity in this one area only.  The patient was to be considered for surgery however Cardiac evaluation showed other issues. In addition the patient developed a C. difficile colitis. Given the patient's multiple medical problems he is not felt to be a good candidate for surgical intervention and is now seen in radiation oncology to be considered for treatment.   PREVIOUS RADIATION THERAPY: No  PAST MEDICAL HISTORY:  has a past medical history of Hypertension; Hypercholesterolemia; Diabetes mellitus, type 2; Benign prostatic hypertrophy; Emphysema; Lung cancer; COPD (chronic obstructive pulmonary disease); CKD (chronic kidney disease); Colitis; Coronary artery disease; Normocytic anemia; Persistent atrial fibrillation; and Atrial enlargement, left.    PAST SURGICAL HISTORY: Past Surgical History  Procedure Laterality Date  . Hemorrhoid surgery    . Tee without cardioversion N/A 07/29/2012    Procedure: TRANSESOPHAGEAL ECHOCARDIOGRAM (TEE);  Surgeon: Laurey Morale, MD;  Location: [facility] ENDOSCOPY;  Service: Cardiovascular;  Laterality:  N/A;  patient coming from [place] room 1236 talk to jennifer at [place]  talk to [facility] from [facility] will pick up pat. at 8:15 from [place]  . Cardioversion N/A 07/29/2012    Procedure: CARDIOVERSION;  Surgeon: Laurey Morale, MD;  Location: [facility] ENDOSCOPY;  Service: Cardiovascular;  Laterality: N/A;  . Cardiac catheterization    . Coronary angioplasty with stent placement    . Lung biopsy      FAMILY HISTORY: family history includes Cancer in his brother; Heart disease in his father; Hyperlipidemia in his sisters; Hypertension in his brother; Hypothyroidism in his brother; Lung cancer in his brother; and Stroke in his mother.  SOCIAL HISTORY:  reports that he quit smoking about 5 years ago. His smoking use included Cigarettes. He started smoking about 54 years ago. He has a 25 pack-year smoking history. He has never used smokeless tobacco. He reports that he does not drink alcohol or use illicit drugs.  ALLERGIES: Review of patient's allergies indicates no known allergies.  MEDICATIONS:  Current Outpatient Prescriptions  Medication Sig Dispense Refill  . aspirin EC 81 MG tablet Take 81 mg by mouth every other day.      . bethanechol (URECHOLINE) 5 MG tablet Take 1 tablet (5 mg total) by mouth 3 (three) times daily.  30 tablet  0  . budesonide-formoterol (SYMBICORT) 160-4.5 MCG/ACT inhaler Inhale 2 puffs into the lungs 2 (two) times daily.      Marland Kitchen diltiazem (CARDIZEM CD) 180 MG 24 hr capsule Take 1 capsule (180 mg total) by mouth daily.  30 capsule  0  . finasteride (PROSCAR) 5 MG tablet Take 5 mg by mouth every morning.       Marland Kitchen  insulin detemir (LEVEMIR) 100 UNIT/ML injection Inject 18 Units into the skin at bedtime.  10 mL  0  . magnesium oxide (MAG-OX 400) 400 MG tablet Take 1 tablet (400 mg total) by mouth daily.  15 tablet  0  . metFORMIN (GLUCOPHAGE-XR) 500 MG 24 hr tablet Take 2 tablets (1,000 mg total) by mouth 2 (two) times daily. Please hold for 48hrs after heart catheterization.  Resume on 07/15/12      . Tamsulosin HCl (FLOMAX) 0.4 MG CAPS Take 0.4 mg by mouth every morning.       . Ticagrelor (BRILINTA) 90 MG TABS tablet Take 1 tablet (90 mg total) by mouth 2 (two) times daily.  60 tablet  0  . warfarin (COUMADIN) 3 MG tablet Take 4 mg by mouth daily at 6 PM.      . furosemide (LASIX) 20 MG tablet Take 1 tablet (20 mg total) by mouth daily. TAKE 2 TABS 3/5-08/12/12; THEN RESUME TO 20 MG DAILY      . HYDROcodone-acetaminophen (NORCO/VICODIN) 5-325 MG per tablet Take 1-2 tablets by mouth every 4 (four) hours as needed.  30 tablet  0  . metoprolol (LOPRESSOR) 100 MG tablet Take 0.5 tablets (50 mg total) by mouth 2 (two) times daily.  60 tablet  0  . potassium chloride (K-DUR) 10 MEQ tablet Take 1 tablet (10 mEq total) by mouth daily. Take 2 tablets (20 mEq total) daily for 2 days only.  Then reduce dose to 1 tablet (10 mEq) daily.  30 tablet  5   No current facility-administered medications for this encounter.    REVIEW OF SYSTEMS:  A 15 point review of systems is documented in the electronic medical record. This was obtained by the nursing staff. However, I reviewed this with the patient to discuss relevant findings and make appropriate changes.  Patient has dyspnea with exertion. He does use supplemental oxygen most of the day and night. He denies any pain along the left upper chest area,  cough or hemoptysis.  He denies any new areas of bony pain.   PHYSICAL EXAM:  height is 5\' 10"  (1.778 m) and weight is 173 lb 4.8 oz (78.608 kg). His oral temperature is 97.6 F (36.4 C). His blood pressure is 154/81 and his pulse is 74. His respiration is 18 and oxygen saturation is 97%.   BP 154/81  Pulse 74  Temp(Src) 97.6 F (36.4 C) (Oral)  Resp 18  Ht 5\' 10"  (1.778 m)  Wt 173 lb 4.8 oz (78.608 kg)  BMI 24.87 kg/m2  SpO2 97%  General Appearance:    Alert, cooperative, no distress, appears stated age, mild confusion   Head:    Normocephalic, without obvious abnormality,  atraumatic  Eyes:    PERRL, conjunctiva/corneas clear, EOM's intact,            Nose:   Nares normal, septum midline, mucosa normal, no drainage    or sinus tenderness  Throat:   Lips, mucosa, and tongue normal; teeth and gums normal, dentures in place.   Neck:   Supple, symmetrical, trachea midline, no adenopathy;       thyroid:  No enlargement/tenderness/nodules; no carotid   bruit or JVD  Back:     Symmetric, no curvature, ROM normal, no CVA tenderness  Lungs:     Clear to auscultation bilaterally, respirations unlabored  Chest :    No tenderness or deformity, the heart has regular rhythm and rate at this time.  Abdomen:     Soft, non-tender, bowel sounds active all four quadrants,    no masses, no organomegaly        Extremities:   Extremities normal, atraumatic, pitting edema in both lower extremities, no palpable cords   Pulses:   2+ and symmetric all extremities,   Skin:   Skin color, texture, turgor normal, no rashes or lesions  Lymph nodes:   Cervical, supraclavicular, and axillary nodes normal  Neurologic:   Normal strength, sensation and reflexes      throughout    LABORATORY DATA:  Lab Results  Component Value Date   WBC 9.1 08/02/2012   HGB 8.4* 08/02/2012   HCT 25.0* 08/02/2012   MCV 81.7 08/02/2012   PLT 186 08/02/2012   Lab Results  Component Value Date   NA 137 08/02/2012   K 4.2 08/02/2012   CL 103 08/02/2012   CO2 23 08/02/2012   Lab Results  Component Value Date   ALT 10 07/24/2012   AST 9 07/24/2012   ALKPHOS 46 07/24/2012   BILITOT 0.4 07/24/2012     RADIOGRAPHY: Dg Chest 2 View  07/24/2012  *RADIOLOGY REPORT*  Clinical Data: Atrial fibrillation.  Fever.  CHEST - 2 VIEW  Comparison: 07/04/2012 next few  Findings: There is mild cardiac enlargement.  Again noted is the left upper lobe lung mass measuring approximately 4.2 cm.  Advanced interstitial lung disease is identified bilaterally.  Unchanged mid and lower lung pulmonary opacities which could be  related to chronic lung disease or multifocal infection.  IMPRESSION:  1.  Unchanged left upper lobe lung mass. 2.  Similar appearance of bilateral mid and lower lung zone opacities   Original Report Authenticated By: Signa Kell, M.D.    Ct Abdomen Pelvis W Contrast  IMPRESSION:  1.  Diffuse colitis noted involving the entirety of the colon, most prominent along the sigmoid colon, with surrounding soft tissue inflammation and trace associated free fluid.  No evidence of perforation or abscess formation. 2.  Small right and trace left pleural effusions, with underlying bibasilar airspace opacification.  Suggestion of mild associated honeycombing.  Small nodules at the right lung base are stable from 2012 and likely benign. 3.  Scattered calcification along the abdominal aorta and its branches, including at the origins of both renal arteries.  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 4.  Diffuse coronary artery calcifications seen. 5.  Cholelithiasis; gallbladder otherwise unremarkable in appearance. 6.  Left renal scarring and tiny bilateral renal cysts.   Original Report Authenticated By: Tonia Ghent, M.D.       IMPRESSION: Clinical stage I non-small cell lung cancer. Patient is not a good candidate for surgery in light of his medical issues. He would be a good candidate for a definitive course of radiation therapy. The Patient's lesion is close to the chest wall but he would appear to be a candidate for stereotactic body radiotherapy. Given the location he may be at risk for rib fractures but patient understands this issue and is willing to accept this risk. He would much prefer to have the increased chance for cure with SBRT over conventional radiation treatment and the convenience of this treatment over conventional radiation therapy.  PLAN: Simulation and planning tomorrow. The Patient will receive 5 SBRT treatments on a every other day basis.  I  spent 60 minutes minutes face to face with the patient and more than 50% of that time was spent in counseling and/or  coordination of care.   ------------------------------------------------ -----------------------------------  [name], PhD, MD

## 2012-08-11 NOTE — Progress Notes (Signed)
Patient presents to the clinic today accompanied by his wife and daughter for follow up new consultation with Dr. Roselind Messier to discuss the role of radiation therapy in the treatment of LUL lung ca. Patient is alert and oriented to person, place, and time. No distress noted. Steady gait noted. Pleasant affect noted. Patient denies pain at this time. Patient denies unintentional weight loss. Patient denies night sweats. Patient reports that his appetite is slowly improving. Patient denies nausea, vomiting, headache, or dizziness. Patient carrying portable oxygen tank. Patient is not wearing nasal cannula/oxygen. Patient reports that he only uses the oxygen occasional at 2 liters when he becomes short of breath. Patient reports that shortness of breath is better now that he has been on lasix. Patient reports that he is now able to lay flat and breath without difficulty since he has been on the lasix. Patient denies cough. Patient denies painful or difficulty swallowing. Patient denies diplopia or ringing of the ears. Patient reports that a nurse comes out to the home along with physical therapy a couple times per week. Reported all findings to Dr. Roselind Messier.  NKDA Denies having a pacemaker No hx of radiation therapy

## 2012-08-11 NOTE — Progress Notes (Signed)
[address]       [place] [address]             [phone]         Brandon Clay [facility] Medical Record #[id number] Date of Birth: [birth year]  Referring: Dr  Abran Cantor Primary Care: [name], MD  Chief Complaint:    Chief Complaint  Patient presents with  . Follow-up    1 week f/u from hospital discharge,   . Lung Cancer    further discuss surgery     History of Present Illness:    Patient is a 78 year old male referred by Dr. 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 On Christmas Day the patient while picking up sticks in his front yard had some onset of shortness of breath and fatigue and was taken to the [facility] emergency room. He was hospitalized for a week diagnosed with pneumonia, in addition a new left upper lobe lung mass was appreciated further workup was performed including a needle biopsy confirming squamous cell carcinoma. Ultimately the patient was discharged home on home oxygen and with respiratory treatments. He notes he is markedly improved at this point no longer using oxygen. He was  referred for consideration of surgical resection. Since last  seen he has cardiac stress test, which was positive. A cardiac catheterization was performed stent grafts placed in a severely stenotic circumflex vessel. The patient's course has been complicated by prolonged hospitalization for difficult to control atrial fibrillation and and severe colitis. His overall functional status has decreased because of this. He is now back on oxygen a significant amount of the time. No since last seen in the hospital he does feel better.  He now comes in today for after hospital visit, while he was in the hospital and was progressing slowly I discussed the case with radiation oncology to consider alternative treatments. The patient saw Dr. Roselind Messier who agreed that  stereotactic radiotherapy would be a reasonable alternative treatment considering his increasing medical problems.    Current Activity/ Functional Status: Patient is independent with mobility/ambulation, transfers, ADL's, IADL's. Zubrod Score: At the time of surgery this patient's most appropriate activity status/level should be described as: []  Normal activity, no symptoms [x]  Symptoms, fully ambulatory []  Symptoms, in bed less than or equal to 50% of the time []  Symptoms, in bed greater than 50% of the time but less than 100% []  Bedridden []  Moribund  Past Medical History  Diagnosis Date  . Hypertension   . Hypercholesterolemia     statin intolerant  . Diabetes mellitus, type 2   . Benign prostatic hypertrophy   . Emphysema   . Lung cancer     Followed by Dr. [name] (LUL squamous cell carcinoma)  . COPD (chronic obstructive pulmonary disease)   . CKD (chronic kidney disease)   . Colitis   . Coronary artery disease     Cath 07/02/12 3v CAD w/ chronic occlusion of RCA (fills from left collaterals), severe stenosis prox LCx & moderately severe dz in mid LAD; s/p PTCA/BMS to prox LCx 07/12/12   . Normocytic anemia   . Persistent atrial fibrillation   . Atrial enlargement, left     Past Surgical History  Procedure Laterality Date  .  Hemorrhoid surgery    . Tee without cardioversion N/A 07/29/2012    Procedure: TRANSESOPHAGEAL ECHOCARDIOGRAM (TEE);  Surgeon: Laurey Morale, MD;  Location: [facility] ENDOSCOPY;  Service: Cardiovascular;  Laterality: N/A;  patient coming from [place] room 1236 talk to jennifer at [place]  talk to [facility] from care link will pick up pat. at 8:15 from [place]  . Cardioversion N/A 07/29/2012    Procedure: CARDIOVERSION;  Surgeon: Laurey Morale, MD;  Location: [facility] ENDOSCOPY;  Service: Cardiovascular;  Laterality: N/A;  . Cardiac catheterization    . Coronary angioplasty with stent placement    . Lung biopsy      Family History  Problem Relation Age of  Onset  . Stroke Mother   . Heart disease Father     died of heart attack  . Lung cancer Brother   . Hypertension Brother   . Cancer Brother     brain cancer  . Hypothyroidism Brother   . Hyperlipidemia Sister   . Hyperlipidemia Sister     History   Social History  . Marital Status: Married    Spouse Name: N/A    Number of Children: 1  . Years of Education: N/A   Occupational History  . retired [employer] for over 20 years  . retired     Personnel officer for 10 years   Social History Main Topics  . Smoking status: Former Smoker -- 0.5 packs/day for 50 years    Types: Cigarettes    Start date: 06/09/1958    Quit date: 06/10/2007  . Smokeless tobacco: Never Used  . Alcohol Use: No  . Drug Use: No  . Sexually Active: Not on file      History  Smoking status  . Former Smoker -- 0.50 packs/day for 50 years  . Types: Cigarettes  . Start date: 06/09/1958  . Quit date: 06/10/2007  Smokeless tobacco  . Never Used    History  Alcohol Use No     No Known Allergies  Current Outpatient Prescriptions  Medication Sig Dispense Refill  . aspirin EC 81 MG tablet Take 81 mg by mouth every other day.      . bethanechol (URECHOLINE) 5 MG tablet Take 1 tablet (5 mg total) by mouth 3 (three) times daily.  30 tablet  0  . budesonide-formoterol (SYMBICORT) 160-4.5 MCG/ACT inhaler Inhale 2 puffs into the lungs 2 (two) times daily.      Marland Kitchen diltiazem (CARDIZEM CD) 180 MG 24 hr capsule Take 1 capsule (180 mg total) by mouth daily.  30 capsule  0  . finasteride (PROSCAR) 5 MG tablet Take 5 mg by mouth every morning.       . furosemide (LASIX) 20 MG tablet Take 1 tablet (20 mg total) by mouth daily. TAKE 2 TABS 3/5-08/12/12; THEN RESUME TO 20 MG DAILY      . HYDROcodone-acetaminophen (NORCO/VICODIN) 5-325 MG per tablet Take 1-2 tablets by mouth every 4 (four) hours as needed.  30 tablet  0  . insulin detemir (LEVEMIR) 100 UNIT/ML injection Inject 18 Units into  the skin at bedtime.  10 mL  0  . magnesium oxide (MAG-OX 400) 400 MG tablet Take 1 tablet (400 mg total) by mouth daily.  15 tablet  0  . metFORMIN (GLUCOPHAGE-XR) 500 MG 24 hr tablet Take 2 tablets (1,000 mg total) by mouth 2 (two) times daily. Please hold for 48hrs after heart catheterization. Resume on 07/15/12      .  metoprolol (LOPRESSOR) 100 MG tablet Take 0.5 tablets (50 mg total) by mouth 2 (two) times daily.  60 tablet  0  . potassium chloride (K-DUR) 10 MEQ tablet Take 1 tablet (10 mEq total) by mouth daily. Take 2 tablets (20 mEq total) daily for 2 days only.  Then reduce dose to 1 tablet (10 mEq) daily.  30 tablet  5  . Tamsulosin HCl (FLOMAX) 0.4 MG CAPS Take 0.4 mg by mouth every morning.       . Ticagrelor (BRILINTA) 90 MG TABS tablet Take 1 tablet (90 mg total) by mouth 2 (two) times daily.  60 tablet  0  . warfarin (COUMADIN) 3 MG tablet Take 4 mg by mouth daily at 6 PM.       No current facility-administered medications for this visit.       Review of Systems:     Cardiac Review of Systems: Y or N  Chest Pain [ vague not sure    ]  Resting SOB [ n  ] Exertional SOB  Cove.Etienne  ]  Orthopnea [ n ]   Pedal Edema [ y  ]    Palpitations Cove.Etienne  ] Syncope  [ n ]   Presyncope [ n  ]  General Review of Systems: [Y] = yes [  ]=no Constitional: recent weight change Milo.Brash  ]; anorexia [ n ]; fatigue Cove.Etienne  ]; nausea [n  ]; night sweats [n  ]; fever [ n ]; or chills [ n ];                                                                                                                                          Dental: poor dentition[ dentures ];   Eye : blurred vision [  ]; diplopia [   ]; vision changes [  ];  Amaurosis fugax[  ]; Resp: cough [ y ];  wheezing[y  ];  hemoptysis[n  ]; shortness of breath[y  ]; paroxysmal nocturnal dyspnea[  ]; dyspnea on exertion[ y ]; or orthopnea[  ];  GI:  gallstones[  ], vomiting[  ];  dysphagia[  ]; melena[  ];  hematochezia [  ]; heartburn[  ];   Hx of  Colonoscopy[y   ]; loose stools have improved GU: kidney stones [  ]; hematuria[  ];   dysuria [n  ];  nocturia[  ];  history of     obstruction [  ];             Skin: rash, swelling[  ];, hair loss[  ];  peripheral edema[  ];  or itching[  ]; Musculosketetal: myalgias[  ];  joint swelling[  ];  joint erythema[  ];  joint pain[  ];  back pain[  ];  Heme/Lymph: bruising[  ];  bleeding[  ];  anemia[  ];  Neuro: TIA[  ];  headaches[  ];  stroke[  ];  vertigo[  ];  seizures[  ];   paresthesias[  ];  difficulty walking[  ];  Psych:depression[  ]; anxiety[  ];  Endocrine: diabetes[ y ];  thyroid dysfunction[  ];  Immunizations: Flu [ y ]; Pneumococcal[y  ];  Other:  Physical Exam: BP 113/67  Pulse 69  Resp 18  Ht 5\' 10"  (1.778 m)  Wt 174 lb (78.926 kg)  BMI 24.97 kg/m2  SpO2 94%  General appearance: alert, cooperative, appears stated age and no distress Neurologic: intact Heart: regular rate and rhythm, S1, S2 normal, no murmur, click, rub or gallop and normal apical impulse Lungs: diminished breath sounds bibasilar Abdomen: soft, non-tender; bowel sounds normal; no masses,  no organomegaly Extremities: edema 2+ pedal edema in both ankles, patient notes its worst and in the afternoons Do not appreciate any cervical or supraclavicular adenopathy he has no axillary adenopathy Patient appears to be in sinus rhythm today.  Diagnostic Studies & Laboratory data:     Recent Radiology Findings:  Ct of chest , pet  , ct of head done at [facility] in [place] system PET scan shows a 3.7 cm mass with an SUV of 9.7 in the left upper lobe abutting the pleura there is no evidence of hypermetabolic mediastinal nodes or other metastasis both on the PET scan and CT scan there is prominent coronary calcifications also gallstones are present.  The scar area in the right upper lobe was not hypermetabolic .CT scan of the chest done 06/02/2012 was negative for pulmonary emboli a 4.1 cm mass in the anterior aspect of the left  upper lobe with adjacent pneumonitis is noted there is a less well-defined spiculated mass in the right apex is enlarged bowel are hilar and mediastinal nodes as poorly defined asymmetric groundglass opacities throughout both lower lobes right greater than left there significant coronary artery calcifications.   A CT-guided needle biopsy of left upper lobe mass was done 06/07/2012 at [facility] number [id number] is interpreted by Dr. Ferne Reus has squamous cell carcinoma Recent Lab Findings: Lab Results  Component Value Date   WBC 9.1 08/02/2012   Cardiac Stress Test: Impression  Exercise Capacity: Lexiscan with no exercise.  BP Response: Normal blood pressure response.  Clinical Symptoms: No significant symptoms noted.  ECG Impression: No significant ST segment change suggestive of ischemia.  Comparison with Prior Nuclear Study: No previous nuclear study performed  Overall Impression: Intermediate stress nuclear study. Large lateral and inferolateral wall infarct with mild peri infarct ischemia  LV Ejection Fraction: 45%. LV Wall Motion: Inferior and lateral wall hypokinesis  Charlton Haws   PFT's  FEV1 2.47 79 %, DLCO 9.69 29%     Cardiac cath done 07/02/2012. See report : Hemodynamic Findings:  Central aortic pressure: 134/49  Left ventricular pressure: 134/15/23  Angiographic Findings:  Left main: Distal 20% stenosis.  Left Anterior Descending Artery: Large caliber vessel that courses to the apex. The proximal vessel has diffuse plaque disease. The mid vessel has a 60% stenosis just before the diagonal branch. The first diagonal branch is small in caliber and has diffuse disease. The second diagonal is small in caliber and has 99% stenosis.  Circumflex Artery: Large caliber vessel with 99% proximal stenosis. The distal AV groove Circumflex has diffuse 40% stenosis.  Right Coronary Artery: Large dominant vessel with 80% diffuse stenosis proximal vessel. The mid vessel has a 100%  stenosis. The distal vessel fills from left to right collaterals.  Left Ventricular Angiogram: LVEF=45%. Inferior  wall hypokinesis.  Impression:  1. 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  2. Segmental LV systolic dysfunction  Recommendations: Complex situation in patient with lung cancer and new diagnosis of triple vessel CAD. His RCA is chronically occluded. The proximal Circumflex has a severe stenosis which could be treated percutaneously however this will need to be discussed with Dr. [name] and Dr. Eden Emms in regards to planning the timing with lobectomy. Will discuss possibility of CABG versus stenting of the Circumflex with a bare metal stent which would require one month of dual anti-platelet therapy. He will go home today and further planning will be done as an outpatient.  Complications: None. The patient tolerated the procedure well.   Patient underwent placement of a bare-metal stent in the circumflex.  Assessment / Plan:      1 new diagnosis of stage clinical stage IB squamous cell carcinoma of the left upper lobe 2 Advanced emphysema with severe diffusion defect, has not been smoking for 4 years 3 History of diabetes 4 recent admission for atrial fibrillation and diffuse colitis  After seeing the patient in the hospital discussing with radiation oncology and with the patient and his family we have decided to proceed with stereotactic radiotherapy rather than surgical resection. From the patient's recent prolonged hospitalization and overall medical condition it is more likely that we can initiate treatment that he could tolerate quicker by proceeding with radiation. With his significantly depressed oxygen diffusion capacity surgical resection would carry a significant risk. The patient and his family are agreeable with this approach and have a schedule to start treatment in the near future with radiation therapy  by Dr. Roselind Messier.  I'll plan to see the patient back in 2 months with a followup PA and lateral chest x-ray, and then will coordinate with radiation oncology followup CT scans.  Delight Ovens MD  Beeper [phone] Office [phone] 08/11/2012 4:18 PM

## 2012-08-11 NOTE — Progress Notes (Signed)
[address], [address] [place], [place]  [address] Phone: [phone], Fax:  [phone]  Date:  08/11/2012   ID:  Eran, Mistry 1934-09-07, MRN [id number]  PCP:  [name], MD  Primary Cardiologist:  Dr. Charlton Haws     History of Present Illness: Brandon Clay is a 78 y.o. male who returns for follow up after a recent admission to the hospital.  He has a hx of HTN, DM2, COPD, tobacco abuse.  He established with Dr. Charlton Haws in 06/2012 for surgical clearance for lobectomy for lung cancer.  Myoview was abnormal with EF 45%, lateral and inferolateral scar with peri-infarct ischemia.  LHC 07/02/12: dLM 20%, mLAD 60%, small D2 99%, pCFX 99%, dAVCFX 40%, pRCA 80%, mRCA 100%, distal filled with L-R collats, EF 45%.  Patient was too high risk for CABG.  He underwent PCI with BMS to the pCFX.  He had a high PRU on Plavix and was switched to Brilinta.  DAPT recommended x 1 month.  He was then admitted 2/15-2/24 with dehydration and AFib with RVR in the setting of CDiff colitis.  He was placed on coumadin.  Echo 07/25/12: mild to mod LVH, EF 60-65%, mild BAE.  He underwent TEE-DCCV but returned to AFib again. He was seen by Dr. Johney Frame for EP.  Best option felt to be rate control and anticoagulation.  Options of amiodarone or Tikosyn could be considered after lung surgery.  Of note, Dr. [name] saw the patient in the hospital and felt he was too weak to proceed with lung resection and alternative treatments would need to be pursued.   Since, d/c he has noted increased dyspnea, orthopnea, PND and LE edema.  No chest pain or syncope.  PCP started him on Lasix 2 days ago with some improvement. He saw radiation oncology today with plans to start Rx tomorrow.  He sees Dr. [name] later today.    Labs (2/14):  K 4.2, creatinine 0.97, ALT 10, Hgb 8.4, TSH 1.937  Wt Readings from Last 3 Encounters:  08/11/12 174 lb (78.926 kg)  08/03/12 196 lb 3.4 oz (89 kg)  08/03/12 196  lb 3.4 oz (89 kg)     Past Medical History  Diagnosis Date  . Hypertension   . Hypercholesterolemia     statin intolerant  . Diabetes mellitus, type 2   . Benign prostatic hypertrophy   . Emphysema   . Lung cancer     Followed by Dr. [name] (LUL squamous cell carcinoma)  . COPD (chronic obstructive pulmonary disease)   . CKD (chronic kidney disease)   . Colitis   . Coronary artery disease     Cath 07/02/12 3v CAD w/ chronic occlusion of RCA (fills from left collaterals), severe stenosis prox LCx & moderately severe dz in mid LAD; s/p PTCA/BMS to prox LCx 07/12/12   . Normocytic anemia   . Persistent atrial fibrillation   . Atrial enlargement, left     Current Outpatient Prescriptions  Medication Sig Dispense Refill  . aspirin EC 81 MG tablet Take 81 mg by mouth every other day.      . bethanechol (URECHOLINE) 5 MG tablet Take 1 tablet (5 mg total) by mouth 3 (three) times daily.  30 tablet  0  . budesonide-formoterol (SYMBICORT) 160-4.5 MCG/ACT inhaler Inhale 2 puffs into the lungs 2 (two) times daily.      Marland Kitchen diltiazem (CARDIZEM CD) 180 MG 24 hr capsule Take 1 capsule (180 mg total) by  mouth daily.  30 capsule  0  . finasteride (PROSCAR) 5 MG tablet Take 5 mg by mouth every morning.       . furosemide (LASIX) 20 MG tablet 20 mg daily.       Marland Kitchen HYDROcodone-acetaminophen (NORCO/VICODIN) 5-325 MG per tablet Take 1-2 tablets by mouth every 4 (four) hours as needed.  30 tablet  0  . insulin detemir (LEVEMIR) 100 UNIT/ML injection Inject 18 Units into the skin at bedtime.  10 mL  0  . magnesium oxide (MAG-OX 400) 400 MG tablet Take 1 tablet (400 mg total) by mouth daily.  15 tablet  0  . metFORMIN (GLUCOPHAGE-XR) 500 MG 24 hr tablet Take 2 tablets (1,000 mg total) by mouth 2 (two) times daily. Please hold for 48hrs after heart catheterization. Resume on 07/15/12      . metoprolol (LOPRESSOR) 100 MG tablet Take 1 tablet (100 mg total) by mouth 2 (two) times daily.  60 tablet  0  . Tamsulosin  HCl (FLOMAX) 0.4 MG CAPS Take 0.4 mg by mouth every morning.       . Ticagrelor (BRILINTA) 90 MG TABS tablet Take 1 tablet (90 mg total) by mouth 2 (two) times daily.  60 tablet  0  . warfarin (COUMADIN) 3 MG tablet Take 4 mg by mouth daily at 6 PM.       No current facility-administered medications for this visit.    Allergies:   No Known Allergies  Social History:  The patient  reports that he quit smoking about 5 years ago. His smoking use included Cigarettes. He started smoking about 54 years ago. He has a 25 pack-year smoking history. He has never used smokeless tobacco. He reports that he does not drink alcohol or use illicit drugs.   ROS:  Please see the history of present illness.   He has dark stools.  There has been no change.  No hematochezia.  Diarrhea resolved.   All other systems reviewed and negative.   PHYSICAL EXAM: VS:  BP 94/50  Pulse 71  Ht 5\' 10"  (1.778 m)  Wt 174 lb (78.926 kg)  BMI 24.97 kg/m2 Well nourished, well developed, in no acute distress HEENT: normal Neck: + JVD Cardiac:  normal S1, S2; RRR; no murmur Lungs:  Decreased breath sounds at the bases bilaterally with coarse breath sounds, no wheezing or rhonchi Abd: soft, nontender, no hepatomegaly Ext: 1-2+ bilateral LE edema Skin: warm and dry Neuro:  CNs 2-12 intact, no focal abnormalities noted  EKG:  NSR, HR 71, inf-lat TWI, no change from prior tracing (aside from restoration of NSR)     ASSESSMENT AND PLAN:  1. Acute Diastolic CHF:  Suspect volume overload related to volume resuscitation in the setting of diarrhea from CDiff colitis.  His EF was normal on Echo and TEE during last admission.  He is clearly volume overloaded.  He has had some response to lasix over the last 2 days.  Will increase Lasix to 40 mg QD x 2 days, then resume 20 mg QD.  Start K+ 20 mEq QD x 2 days, then continue 10 mEq QD.  Check BMET, CBC, BNP.  Repeat BMET in 1 week.  Check a CXR.   2. Atrial Fibrillation:  Luckily he has  returned to NSR.  Continue coumadin.  BP now somewhat low.  Suspect AFib was related to acute illness more than anything.  With soft BPs, will reduce metoprolol to 50 mg BID. 3. Coronary Artery Disease:  No angina.  Continue ASA and Brilinta.  It looks like plan was to stop DAPT 30 days after PCI with a bare metal stent.  I will review with Dr. Charlton Haws to confirm this and then let the patient know if he should stop the Brilinta.   4. Lung Cancer:  Follow up with Dr. [name] and Dr. Roselind Messier as planned. 5. Disposition:  Follow up with me on day Dr. Eden Emms here in 1 week.  Luna Glasgow, PA-C  12:35 PM 08/11/2012

## 2012-08-12 ENCOUNTER — Telehealth: Payer: Self-pay | Admitting: Physician Assistant

## 2012-08-12 ENCOUNTER — Telehealth: Payer: Self-pay | Admitting: Radiation Oncology

## 2012-08-12 ENCOUNTER — Ambulatory Visit
Admission: RE | Admit: 2012-08-12 | Discharge: 2012-08-12 | Disposition: A | Discharge status: Home / Self Care | Payer: Medicare Other | Source: Ambulatory Visit | Attending: Radiation Oncology | Admitting: Radiation Oncology

## 2012-08-12 DIAGNOSIS — R031 Nonspecific low blood-pressure reading: Secondary | ICD-10-CM | POA: Insufficient documentation

## 2012-08-12 DIAGNOSIS — C3490 Malignant neoplasm of unspecified part of unspecified bronchus or lung: Secondary | ICD-10-CM | POA: Insufficient documentation

## 2012-08-12 DIAGNOSIS — R5381 Other malaise: Secondary | ICD-10-CM | POA: Insufficient documentation

## 2012-08-12 DIAGNOSIS — R197 Diarrhea, unspecified: Secondary | ICD-10-CM | POA: Insufficient documentation

## 2012-08-12 DIAGNOSIS — R634 Abnormal weight loss: Secondary | ICD-10-CM | POA: Insufficient documentation

## 2012-08-12 DIAGNOSIS — R112 Nausea with vomiting, unspecified: Secondary | ICD-10-CM | POA: Insufficient documentation

## 2012-08-12 DIAGNOSIS — Z510 Encounter for antineoplastic radiation therapy: Secondary | ICD-10-CM | POA: Insufficient documentation

## 2012-08-12 DIAGNOSIS — I5033 Acute on chronic diastolic (congestive) heart failure: Secondary | ICD-10-CM

## 2012-08-12 DIAGNOSIS — R509 Fever, unspecified: Secondary | ICD-10-CM | POA: Insufficient documentation

## 2012-08-12 MED ORDER — POTASSIUM CHLORIDE ER 10 MEQ PO TBCR: 10.0000 meq | EXTENDED_RELEASE_TABLET | Freq: Every day | ORAL | Status: DC

## 2012-08-12 NOTE — Telephone Encounter (Signed)
**Note De-Identified Wh[place]igh Garramone Obfuscation** Pts daughter, [name], advised that RX has been sent to [facility]

## 2012-08-12 NOTE — Progress Notes (Signed)
  Radiation Oncology         [phone] ________________________________  Name: Brandon Clay MRN: [id number]  Date: 08/12/2012  DOB: 03-06-1935  RESPIRATORY MOTION MANAGEMENT SIMULATION  NARRATIVE:  In order to account for effect of respiratory motion on target structures and other organs in the planning and delivery of radiotherapy, this patient underwent respiratory motion management simulation.  To accomplish this, when the patient was brought to the CT simulation planning suite, 4D respiratoy motion management CT images were obtained.  The CT images were loaded into the planning software.  Then, using a variety of tools including Cine, MIP, and standard views, the target volume and planning target volumes (PTV) were delineated.  Avoidance structures were contoured.  Treatment planning then occurred.  Dose volume histograms were generated and reviewed for each of the requested structure.  The resulting plan was carefully reviewed and approved today.  -----------------------------------  [name], PhD, MD

## 2012-08-12 NOTE — Telephone Encounter (Signed)
Met w patient to discuss RO billing. Pt had no financial concerns today.  Dx: Lung  Attending Rad: JK  Rad Tx: SRS x5

## 2012-08-12 NOTE — Telephone Encounter (Signed)
New problem   Pt's daughter want to change the location for her dad's medication for his Potassium med that Dr Alben Spittle prescribe yesterday 08/11/12 would like for the med to be called in to [facility] [place] phone# [phone]. Please call pt's daughter concerning this matter

## 2012-08-16 NOTE — Progress Notes (Signed)
[facility] Radiation Oncology Simulation and Treatment Planning Note   Name:  Kamarius, Buckbee MRN: [id number]   Date: 08/12/12  DOB: 1935-02-10  Status:outpatient    DIAGNOSIS: Squamous cell carcinoma of the left upper lung   CONSENT VERIFIED:yes   SET UP: Patient is setup supine   IMMOBILIZATION: The patient was immobilized using a Vac Loc bag and Abdominal Compression.   NARRATIVE:The patient was brought to the CT Simulation planning suite.  Identity was confirmed.  All relevant records and images related to the planned course of therapy were reviewed.  Then, the patient was positioned in a stable reproducible clinical set-up for radiation therapy. Abdominal compression was applied by me.  4D CT images were obtained and reproducible breathing pattern was confirmed. Free breathing CT images were obtained.  Skin markings were placed.  The CT images were loaded into the planning software where the target and avoidance structures were contoured.  The radiation prescription was entered and confirmed.    TREATMENT PLANNING NOTE:  Treatment planning then occurred. I have requested : MLC's, isodose plan, basic dose calculation.  3 dimensional simulation is performed and dose volume histogram of the gross tumor volume, planning tumor volume and criticial normal structures including the spinal cord and lungs were analyzed and requested.  Special treatment procedure was performed due to high dose per fraction.  The patient will be monitored for increased risk of toxicity.  Daily imaging using cone beam CT will be used for target localization.   Plan:  The patient will proceed with SBRT treatment techniques. He will likely be treated to 60 Gy in 5 fractions depending on overall plan results.  -----------------------------------  [name], PhD, MD

## 2012-08-17 ENCOUNTER — Telehealth: Payer: Self-pay | Admitting: *Deleted

## 2012-08-17 NOTE — Telephone Encounter (Signed)
lmom labs ok 

## 2012-08-19 ENCOUNTER — Telehealth: Payer: Self-pay | Admitting: *Deleted

## 2012-08-19 ENCOUNTER — Ambulatory Visit (INDEPENDENT_AMBULATORY_CARE_PROVIDER_SITE_OTHER): Payer: Medicare Other | Admitting: Physician Assistant

## 2012-08-19 VITALS — BP 92/60 | HR 97 | Ht 70.0 in | Wt 169.8 lb

## 2012-08-19 DIAGNOSIS — R609 Edema, unspecified: Secondary | ICD-10-CM

## 2012-08-19 DIAGNOSIS — I2510 Atherosclerotic heart disease of native coronary artery without angina pectoris: Secondary | ICD-10-CM

## 2012-08-19 DIAGNOSIS — I5032 Chronic diastolic (congestive) heart failure: Secondary | ICD-10-CM

## 2012-08-19 DIAGNOSIS — C3492 Malignant neoplasm of unspecified part of left bronchus or lung: Secondary | ICD-10-CM

## 2012-08-19 DIAGNOSIS — I5033 Acute on chronic diastolic (congestive) heart failure: Secondary | ICD-10-CM

## 2012-08-19 DIAGNOSIS — I4891 Unspecified atrial fibrillation: Secondary | ICD-10-CM

## 2012-08-19 LAB — BASIC METABOLIC PANEL
CO2: 24 mEq/L (ref 19–32)
Calcium: 8.8 mg/dL (ref 8.4–10.5)
Chloride: 104 mEq/L (ref 96–112)
Glucose, Bld: 198 mg/dL — ABNORMAL HIGH (ref 70–99)
Sodium: 137 mEq/L (ref 135–145)

## 2012-08-19 NOTE — Progress Notes (Signed)
[address] [place], [place]  [address] Phone: [phone], Fax:  [phone]  Date:  08/19/2012   ID:  Darryll, Raju 11-Apr-1935, MRN [id number]  PCP:  [name], MD  Primary Cardiologist:  Dr. Charlton Haws     History of Present Illness: MIKE HAMRE is a 78 y.o. male who returns for follow up.  He has a hx of HTN, DM2, COPD, tobacco abuse.  He established with Dr. Eden Emms in 06/2012 for surgical clearance for lobectomy for lung cancer.  Myoview was abnormal with EF 45%, lateral and inferolateral scar with peri-infarct ischemia.  LHC 07/02/12: dLM 20%, mLAD 60%, small D2 99%, pCFX 99%, dAVCFX 40%, pRCA 80%, mRCA 100%, distal filled with L-R collats, EF 45%.  Patient was too high risk for CABG.  He underwent PCI with BMS to the pCFX.  He had a high PRU on Plavix and was switched to Brilinta.  DAPT recommended x at least 1 month.  He was then admitted 07/2012 with dehydration and AFib with RVR in the setting of CDiff colitis.  He was placed on coumadin.  Echo 07/25/12: mild to mod LVH, EF 60-65%, mild BAE.  He underwent TEE-DCCV but returned to AFib again. He was seen by Dr. Johney Frame for EP.  Best option felt to be rate control and anticoagulation.  Options of amiodarone or Tikosyn could be considered after lung surgery.  Of note, Dr. [name] saw the patient in the hospital and felt he was too weak to proceed with lung resection and alternative treatments would need to be pursued.   He has since been set up with radiation Rx.  I saw him 3/5 and he was volume overloaded.  I increased his Lasix.  Of note, he had converted to NSR.  He is doing ok.  Denies significant DOE.  He is probably NYHA Class II-IIb.  No chest pain.  No orthopnea, PND.  LE edema is improved.  Better in the AM and worse in the PM.  No syncope.  No palps.    Labs (2/14):  K 4.2, creatinine 0.97, ALT 10, Hgb 8.4, TSH 1.937 Labs (3/14):  K 4.5, creatinine 1.4, Hgb 9.1, BNP 463  Wt  Readings from Last 3 Encounters:  08/19/12 169 lb 12.8 oz (77.021 kg)  08/11/12 174 lb (78.926 kg)  08/11/12 174 lb (78.926 kg)     Past Medical History  Diagnosis Date  . Hypertension   . Hypercholesterolemia     statin intolerant  . Diabetes mellitus, type 2   . Benign prostatic hypertrophy   . Emphysema   . Lung cancer     Followed by Dr. [name] (LUL squamous cell carcinoma)  . COPD (chronic obstructive pulmonary disease)   . CKD (chronic kidney disease)   . Colitis   . Coronary artery disease     Cath 07/02/12 3v CAD w/ chronic occlusion of RCA (fills from left collaterals), severe stenosis prox LCx & moderately severe dz in mid LAD; s/p PTCA/BMS to prox LCx 07/12/12   . Normocytic anemia   . Persistent atrial fibrillation   . Atrial enlargement, left     Current Outpatient Prescriptions  Medication Sig Dispense Refill  . aspirin EC 81 MG tablet Take 81 mg by mouth every other day.      . budesonide-formoterol (SYMBICORT) 160-4.5 MCG/ACT inhaler Inhale 2 puffs into the lungs 2 (two) times daily.      Marland Kitchen diltiazem (CARDIZEM CD) 180 MG 24 hr  capsule Take 1 capsule (180 mg total) by mouth daily.  30 capsule  0  . finasteride (PROSCAR) 5 MG tablet Take 5 mg by mouth every morning.       . furosemide (LASIX) 20 MG tablet Take 1 tablet (20 mg total) by mouth daily. TAKE 2 TABS 3/5-08/12/12; THEN RESUME TO 20 MG DAILY      . insulin detemir (LEVEMIR) 100 UNIT/ML injection Inject 18 Units into the skin at bedtime.  10 mL  0  . magnesium oxide (MAG-OX 400) 400 MG tablet Take 1 tablet (400 mg total) by mouth daily.  15 tablet  0  . metFORMIN (GLUCOPHAGE-XR) 500 MG 24 hr tablet Take 2 tablets (1,000 mg total) by mouth 2 (two) times daily. Please hold for 48hrs after heart catheterization. Resume on 07/15/12      . metoprolol (LOPRESSOR) 100 MG tablet Take 0.5 tablets (50 mg total) by mouth 2 (two) times daily.  60 tablet  0  . potassium chloride (K-DUR) 10 MEQ tablet Take 1 tablet (10 mEq  total) by mouth daily. Take 2 tablets (20 mEq total) daily for 2 days only.  Then reduce dose to 1 tablet (10 mEq) daily.  30 tablet  5  . Tamsulosin HCl (FLOMAX) 0.4 MG CAPS Take 0.4 mg by mouth every morning.       . Ticagrelor (BRILINTA) 90 MG TABS tablet Take 1 tablet (90 mg total) by mouth 2 (two) times daily.  60 tablet  0  . warfarin (COUMADIN) 3 MG tablet Take 4 mg by mouth daily at 6 PM.      . HYDROcodone-acetaminophen (NORCO/VICODIN) 5-325 MG per tablet Take 1-2 tablets by mouth every 4 (four) hours as needed.  30 tablet  0   No current facility-administered medications for this visit.    Allergies:   No Known Allergies  Social History:  The patient  reports that he quit smoking about 5 years ago. His smoking use included Cigarettes. He started smoking about 54 years ago. He has a 25 pack-year smoking history. He has never used smokeless tobacco. He reports that he does not drink alcohol or use illicit drugs.   ROS:  Please see the history of present illness.    All other systems reviewed and negative.   PHYSICAL EXAM: VS:  BP 92/60  Pulse 97  Ht 5\' 10"  (1.778 m)  Wt 169 lb 12.8 oz (77.021 kg)  BMI 24.36 kg/m2 Well nourished, well developed, in no acute distress HEENT: normal Neck: JVD Cardiac:  normal S1, S2; irregularly irregular; no murmur Lungs:  Decreased breath sounds at the bases bilaterally with coarse breath sounds, no wheezing or rhonchi Abd: soft, nontender, no hepatomegaly Ext: trace bilateral ankle edema Skin: warm and dry Neuro:  CNs 2-12 intact, no focal abnormalities noted  EKG:  AFib, HR 97, normal axis inf-lat TWI    ASSESSMENT AND PLAN:  1. Chronic Diastolic CHF:  Volume improved.  Continue current Rx.  Check BMET today.  Some of his edema is dependent and I have recommended OTC compression stockings.     2. Atrial Fibrillation:  Recurrent.  Rate is controlled.  He remains on coumadin.  Could consider anti-arrhythmic drug Rx in the future if restoring  NSR becomes imperative.   3. Coronary Artery Disease:  No angina.  Continue ASA and Brilinta.  Reviewed with Dr. Charlton Haws.  Since he is not having surgery, he would like Chrissie Noa Ressler to remain on Brilinta for one more  month.  Will provide samples today.   4. Lung Cancer:  Follow up with Dr. [name] and Dr. Roselind Messier as planned.  Radiation to begin soon. 5. Disposition:  Follow up with Dr. Charlton Haws in 1 month.  Luna Glasgow, PA-C  9:34 AM 08/19/2012

## 2012-08-19 NOTE — Telephone Encounter (Signed)
Message copied by Tarri Fuller on Thu Aug 19, 2012  3:27 PM ------      Message from: [place], [place] T      Created: Thu Aug 19, 2012  2:06 PM       K+ and creatinine ok.      BUN little higher.  Probably getting on the dry side.      Stop taking lasix and K+ daily.      He may take Lasix and K+ once daily PRN for increased swelling, dyspnea or weight (3 lbs in one day).      Tereso Newcomer, PA-C  2:06 PM 08/19/2012 ------

## 2012-08-19 NOTE — Patient Instructions (Addendum)
YOU HAVE BEEN GIVEN SAMPLES OF BRILINTA  LAB TODAY; BMET  SCOTT WEAVER, PAC WOULD LIKE FOR YOU TO GET OTC COMPRESSION STOCKINGS TO WEAR DAILY FROM THE TIME YOU WAKE TILL BED TIME.  PLEASE FOLLOW UP WITH DR. Eden Emms 09/20/12 @ 9:15

## 2012-08-19 NOTE — Telephone Encounter (Signed)
lmtpcb to go over lab results and med changes.Brandon Clay

## 2012-08-20 MED ORDER — POTASSIUM CHLORIDE ER 10 MEQ PO TBCR: 10.0000 meq | EXTENDED_RELEASE_TABLET | Freq: Every day | ORAL | Status: DC

## 2012-08-20 MED ORDER — FUROSEMIDE 20 MG PO TABS: 20.0000 mg | ORAL_TABLET | Freq: Every day | ORAL | Status: DC

## 2012-08-20 NOTE — Telephone Encounter (Signed)
pt's daughter [name] notified about the med changes to stop lasix and K+ and only take PRN for 2-3 lb wt gain , sob, edema. I did advise if pt takes more than 1 x weekly to let us know because pt would need lab work. daughter verbalized understanding

## 2012-08-20 NOTE — Telephone Encounter (Signed)
Pt Daughter returning call from yesterday

## 2012-08-20 NOTE — Telephone Encounter (Signed)
HOLD PER SCOTT WEAVER, PAC 08/20/12; TAKE PRN WT GAIN 2-3 LB

## 2012-08-23 ENCOUNTER — Ambulatory Visit
Admission: RE | Admit: 2012-08-23 | Discharge: 2012-08-23 | Disposition: A | Discharge status: Home / Self Care | Payer: Medicare Other | Source: Ambulatory Visit | Attending: Radiation Oncology | Admitting: Radiation Oncology

## 2012-08-23 NOTE — Progress Notes (Signed)
  Radiation Oncology         [phone] ________________________________  Name: Brandon Clay MRN: [id number]  Date: 08/23/2012  DOB: Mar 01, 1935  Stereotactic Body Radiotherapy Treatment Procedure Note  NARRATIVE:  Brandon Clay was brought to the stereotactic radiation treatment machine and placed supine on the CT couch. The patient was set up for stereotactic body radiotherapy on the body fix pillow.  3D TREATMENT PLANNING AND DOSIMETRY:  The patient's radiation plan was reviewed and approved prior to starting treatment.  It showed 3-dimensional radiation distributions overlaid onto the planning CT.  [facility] for the target structures as well as the organs at risk were reviewed. The documentation of this is filed in the radiation oncology EMR.  SIMULATION VERIFICATION:  The patient underwent CT imaging on the treatment unit.  These were carefully aligned to document that the ablative radiation dose would cover the target volume and maximally spare the nearby organs at risk according to the planned distribution.  SPECIAL TREATMENT PROCEDURE: Brandon Clay received high dose ablative stereotactic body radiotherapy to the planned target volume without unforeseen complications. Treatment was delivered uneventfully. The high doses associated with stereotactic body radiotherapy and the significant potential risks require careful treatment set up and patient monitoring constituting a special treatment procedure   STEREOTACTIC TREATMENT MANAGEMENT:  Following delivery, the patient was evaluated clinically. The patient tolerated treatment without significant acute effects, and was discharged to home in stable condition.    PLAN: Continue treatment as planned.  ________________________________  [name], PhD, MD

## 2012-08-25 ENCOUNTER — Ambulatory Visit
Admission: RE | Admit: 2012-08-25 | Discharge: 2012-08-25 | Disposition: A | Discharge status: Home / Self Care | Payer: Medicare Other | Source: Ambulatory Visit | Attending: Radiation Oncology | Admitting: Radiation Oncology

## 2012-08-25 ENCOUNTER — Telehealth: Payer: Self-pay | Admitting: Radiation Oncology

## 2012-08-25 LAB — CBC WITH DIFFERENTIAL/PLATELET
Basophils Absolute: 0 10*3/uL (ref 0.0–0.1)
EOS%: 0 % (ref 0.0–7.0)
Eosinophils Absolute: 0 10*3/uL (ref 0.0–0.5)
HCT: 26.7 % — ABNORMAL LOW (ref 38.4–49.9)
HGB: 8.6 g/dL — ABNORMAL LOW (ref 13.0–17.1)
LYMPH%: 5.7 % — ABNORMAL LOW (ref 14.0–49.0)
MCH: 26.7 pg — ABNORMAL LOW (ref 27.2–33.4)
MCV: 82.4 fL (ref 79.3–98.0)
MONO%: 11.1 % (ref 0.0–14.0)
NEUT#: 10.9 10*3/uL — ABNORMAL HIGH (ref 1.5–6.5)
NEUT%: 83.1 % — ABNORMAL HIGH (ref 39.0–75.0)
Platelets: 171 10*3/uL (ref 140–400)
RDW: 18.1 % — ABNORMAL HIGH (ref 11.0–14.6)

## 2012-08-25 LAB — BASIC METABOLIC PANEL (CC13)
BUN: 23.5 mg/dL (ref 7.0–26.0)
Calcium: 9 mg/dL (ref 8.4–10.4)
Creatinine: 1.5 mg/dL — ABNORMAL HIGH (ref 0.7–1.3)
Glucose: 124 mg/dl — ABNORMAL HIGH (ref 70–99)

## 2012-08-25 MED ORDER — SODIUM CHLORIDE 0.9 % IV SOLN
Freq: Once | INTRAVENOUS | Status: AC
  Administered 2012-08-25: 16:00:00 via INTRAVENOUS
  Filled 2012-08-25: qty 1000

## 2012-08-25 NOTE — Telephone Encounter (Signed)
Received call from Clydie Braun Case, home health nurse, expressing concerns about patient condition. Phoned patient's wife at home. Patient unavailable to speak with this writer because he is sleeping. Wife explains that since Monday the patient has been vomiting, had diarrhea, elevated heart rate and temperature of 100.5 last night. Wife reports the fever has resolved. Asked her to have the patient her for a nurse evaluation at 1315 prior to treatment appointment to ensure patient is safe for radiation treatment. She verbalized understanding.

## 2012-08-25 NOTE — Progress Notes (Addendum)
IV fluids infused and IV removed.  Site in left antecubital region  Without any redness, swelling nor pain.  Site covered with 2 x 2 gauze and secured with paper tape.  Prior to leaving Mr. Brandon Clay had another episode of diarrhea in the rest room.  His daughter has purchased imodium and will give him 2 pills after he gets home this evening.  Given booklet  With a list of low residue foods and appropriate drinks that will not exacerbate diarrheal episodes.   Checked by Dr. Roselind Messier prior to leaving and given instructions to call for increasing episodes of diarrhea and/or fever since his white count was 13.1 today.   He received 1000 cc's of NS for hydration.  Encouraged to call on tomorrow if he has increasing diarrheal episodes, fever or any other issues.  Patient and family stated understanding and agreement.

## 2012-08-25 NOTE — Progress Notes (Signed)
Patient presented to the clinic today accompanied by his wife and daughter for evaluation prior to SBRT treatment. Patient alert and oriented to person, place, and time. No distress noted. Patient being pushed in wheelchair due to generalized weakness. Flat affect noted. Patient denies pain at this time. However, patient reports nausea, vomiting, and diarrhea since Monday. Patient reports that Monday and Tuesday he ran a low grade fever of 100.5. No fever noted today. 4 pound weight loss noted in six days. Vitals orthostatic. Portable oxygen therapy 2 liters via nasal cannula noted. Phoned Dr. Roselind Messier with findings. Orders for CBC, CMP, and hydration obtained. Escorted patient to lab for draw. Awaiting patient to return to nursing clinic for hydration.

## 2012-08-27 ENCOUNTER — Ambulatory Visit
Admission: RE | Admit: 2012-08-27 | Discharge: 2012-08-27 | Disposition: A | Discharge status: Home / Self Care | Payer: Medicare Other | Source: Ambulatory Visit | Attending: Radiation Oncology | Admitting: Radiation Oncology

## 2012-08-27 ENCOUNTER — Encounter: Payer: Self-pay | Admitting: Radiation Oncology

## 2012-08-27 VITALS — BP 94/60 | HR 93 | Resp 18 | Wt 168.3 lb

## 2012-08-27 DIAGNOSIS — C3492 Malignant neoplasm of unspecified part of left bronchus or lung: Secondary | ICD-10-CM

## 2012-08-27 NOTE — Progress Notes (Signed)
Patient presents to the clinic today accompanied by his wife for nurse evaluation prior to treatment. Patient alert and oriented to person, place, and time. No distress noted. Patient being pushed in wheelchair because he "doesn't like to walk and carry his portable oxygen tank." Pleasant affect noted. Patient denies pain at this time. Patient reports that he is feeling much better following hydration. Patient reports that the diarrhea continues but, has slowed down a lot. Vitals stable. Instructed patient to continue to hold bp medication, stop drinking green tea and grape juice, hydrate with water, gatorade, ginger ale, etc., and continue imodium. Instructed patient to phone staff first thing Monday morning if symptoms returned or he began feeling weak again. Patient verbalized understanding.

## 2012-08-27 NOTE — Progress Notes (Signed)
  Radiation Oncology         [phone] ________________________________  Name: Brandon Clay MRN: [id number]  Date: 08/27/2012  DOB: 02/02/1935  Stereotactic Body Radiotherapy Treatment Procedure Note - lung  NARRATIVE:  Brandon Clay was brought to the stereotactic radiation treatment machine and placed supine on the CT couch. The patient was set up for stereotactic body radiotherapy on the body fix pillow.  3D TREATMENT PLANNING AND DOSIMETRY:  The patient's radiation plan was reviewed and approved prior to starting treatment.  It showed 3-dimensional radiation distributions overlaid onto the planning CT.  The [facility] for the target structures as well as the organs at risk were reviewed. The documentation of this is filed in the radiation oncology EMR.  SIMULATION VERIFICATION:  The patient underwent CT imaging on the treatment unit.  These were carefully aligned to document that the ablative radiation dose would cover the target volume and maximally spare the nearby organs at risk according to the planned distribution.  SPECIAL TREATMENT PROCEDURE: Brandon Clay received high dose ablative stereotactic body radiotherapy to the planned target volume without unforeseen complications. Treatment was delivered uneventfully. The high doses associated with stereotactic body radiotherapy and the significant potential risks require careful treatment set up and patient monitoring constituting a special treatment procedure   STEREOTACTIC TREATMENT MANAGEMENT:  Following delivery, the patient was evaluated clinically. The patient tolerated treatment without significant acute effects, and was discharged to home in stable condition.    PLAN: Continue treatment as planned.  ________________________________  Lonie Peak, MD

## 2012-08-31 ENCOUNTER — Encounter: Payer: Self-pay | Admitting: Radiation Oncology

## 2012-08-31 ENCOUNTER — Ambulatory Visit (INDEPENDENT_AMBULATORY_CARE_PROVIDER_SITE_OTHER): Payer: Medicare Other | Admitting: Cardiovascular Disease

## 2012-08-31 ENCOUNTER — Encounter: Payer: Self-pay | Admitting: Cardiovascular Disease

## 2012-08-31 ENCOUNTER — Ambulatory Visit
Admission: RE | Admit: 2012-08-31 | Discharge: 2012-08-31 | Disposition: A | Discharge status: Home / Self Care | Payer: Medicare Other | Source: Ambulatory Visit | Attending: Radiation Oncology | Admitting: Radiation Oncology

## 2012-08-31 ENCOUNTER — Ambulatory Visit: Payer: Medicare Other | Admitting: Radiation Oncology

## 2012-08-31 ENCOUNTER — Telehealth: Payer: Self-pay | Admitting: Radiation Oncology

## 2012-08-31 VITALS — Wt 167.7 lb

## 2012-08-31 VITALS — BP 82/56 | HR 137 | Ht 70.0 in | Wt 168.0 lb

## 2012-08-31 VITALS — BP 85/43 | HR 123 | Temp 98.0°F | Resp 18

## 2012-08-31 DIAGNOSIS — I2510 Atherosclerotic heart disease of native coronary artery without angina pectoris: Secondary | ICD-10-CM

## 2012-08-31 DIAGNOSIS — C3492 Malignant neoplasm of unspecified part of left bronchus or lung: Secondary | ICD-10-CM

## 2012-08-31 DIAGNOSIS — E7800 Pure hypercholesterolemia, unspecified: Secondary | ICD-10-CM

## 2012-08-31 DIAGNOSIS — C3490 Malignant neoplasm of unspecified part of unspecified bronchus or lung: Secondary | ICD-10-CM | POA: Insufficient documentation

## 2012-08-31 DIAGNOSIS — I4891 Unspecified atrial fibrillation: Secondary | ICD-10-CM

## 2012-08-31 DIAGNOSIS — I119 Hypertensive heart disease without heart failure: Secondary | ICD-10-CM

## 2012-08-31 DIAGNOSIS — A0472 Enterocolitis due to Clostridium difficile, not specified as recurrent: Secondary | ICD-10-CM

## 2012-08-31 LAB — CBC WITH DIFFERENTIAL/PLATELET
BASO%: 0.5 % (ref 0.0–2.0)
Eosinophils Absolute: 0 10*3/uL (ref 0.0–0.5)
LYMPH%: 5.4 % — ABNORMAL LOW (ref 14.0–49.0)
MCHC: 32.2 g/dL (ref 32.0–36.0)
MONO#: 1.2 10*3/uL — ABNORMAL HIGH (ref 0.1–0.9)
NEUT#: 19 10*3/uL — ABNORMAL HIGH (ref 1.5–6.5)
Platelets: 252 10*3/uL (ref 140–400)
RBC: 3.41 10*6/uL — ABNORMAL LOW (ref 4.20–5.82)
RDW: 18.1 % — ABNORMAL HIGH (ref 11.0–14.6)
WBC: 21.4 10*3/uL — ABNORMAL HIGH (ref 4.0–10.3)

## 2012-08-31 LAB — BASIC METABOLIC PANEL (CC13)
CO2: 21 mEq/L — ABNORMAL LOW (ref 22–29)
Calcium: 8.2 mg/dL — ABNORMAL LOW (ref 8.4–10.4)
Glucose: 103 mg/dl — ABNORMAL HIGH (ref 70–99)
Potassium: 3.9 mEq/L (ref 3.5–5.1)
Sodium: 137 mEq/L (ref 136–145)

## 2012-08-31 MED ORDER — AZITHROMYCIN 250 MG PO TABS: ORAL_TABLET | ORAL | Status: DC

## 2012-08-31 NOTE — Assessment & Plan Note (Signed)
Cholesterol is at goal.  Continue current dose of statin and diet Rx.  No myalgias or side effects.  F/U  LFT's in 6 months. No results found for this basename: LDLCALC             

## 2012-08-31 NOTE — Progress Notes (Signed)
  Radiation Oncology         [phone] ________________________________  Name: SEVIN LANGENBACH MRN: [id number]  Date: 08/31/2012  DOB: 10-24-1934  Stereotactic Body Radiotherapy Treatment Procedure Note  NARRATIVE:  Brandon Clay was brought to the stereotactic radiation treatment machine and placed supine on the CT couch. The patient was set up for stereotactic body radiotherapy on the body fix pillow.  3D TREATMENT PLANNING AND DOSIMETRY:  The patient's radiation plan was reviewed and approved prior to starting treatment.  It showed 3-dimensional radiation distributions overlaid onto the planning CT.  The [facility] for the target structures as well as the organs at risk were reviewed. The documentation of this is filed in the radiation oncology EMR.  SIMULATION VERIFICATION:  The patient underwent CT imaging on the treatment unit.  These were carefully aligned to document that the ablative radiation dose would cover the target volume and maximally spare the nearby organs at risk according to the planned distribution.  SPECIAL TREATMENT PROCEDURE: Chrissie Noa Stemler received high dose ablative stereotactic body radiotherapy to the planned target volume without unforeseen complications. Treatment was delivered uneventfully. The high doses associated with stereotactic body radiotherapy and the significant potential risks require careful treatment set up and patient monitoring constituting a special treatment procedure   STEREOTACTIC TREATMENT MANAGEMENT:  Following delivery, the patient was evaluated clinically. The patient tolerated treatment without significant acute effects, and was discharged to home in stable condition.    PLAN: Continue treatment as planned.  ________________________________  [name], PhD, MD

## 2012-08-31 NOTE — Progress Notes (Signed)
   Department of Radiation Oncology  Phone:  [phone] Fax:        [phone]  Pharmacy has recommended the patient be placed on a Z-Pak in light of his Coumadin and the potential interaction with fluoroquinolones.

## 2012-08-31 NOTE — Assessment & Plan Note (Addendum)
Called in refill for cardizem which he has not been taking Chronic infection/XRT and CA make rate control difficult.  Hold coumadin for now as he needs brlinita for CAD Last Hct only 27.6

## 2012-08-31 NOTE — Progress Notes (Signed)
Patient ID: Brandon Clay, male   DOB: 18-Dec-1934, 78 y.o.   MRN: [id number] Brandon Clay is a 78 y.o. male who returns for follow up. He has a hx of HTN, DM2, COPD, tobacco abuse. He established with Dr. Eden Emms in 06/2012 for surgical clearance for lobectomy for lung cancer. Myoview was abnormal with EF 45%, lateral and inferolateral scar with peri-infarct ischemia. LHC 07/02/12: dLM 20%, mLAD 60%, small D2 99%, pCFX 99%, dAVCFX 40%, pRCA 80%, mRCA 100%, distal filled with L-R collats, EF 45%. Patient was too high risk for CABG. He underwent PCI with BMS to the pCFX. He had a high PRU on Plavix and was switched to Brilinta. DAPT recommended x at least 1 month. He was then admitted 07/2012 with dehydration and AFib with RVR in the setting of CDiff colitis. He was placed on coumadin. Echo 07/25/12: mild to mod LVH, EF 60-65%, mild BAE. He underwent TEE-DCCV but returned to AFib again. He was seen by Dr. Johney Frame for EP. Best option felt to be rate control and anticoagulation. Options of amiodarone or Tikosyn could be considered after lung surgery. Of note, Dr. [name] saw the patient in the hospital and felt he was too weak to proceed with lung resection and alternative treatments would need to be pursued. He has since been set up with radiation Rx. I saw him 3/5 and he was volume overloaded. I increased his Lasix.   Seen by Dr Roselind Messier today and WBC elevated ? persistant cdiff.  Antibiotics called in Increased risk of interaction with coumadin Persistant diarrhea.  Lots of bruising in arms.  I am concerned with his frailty an ongoing need for antibiotics to Rx Cdiff.  No chest pain or palpitations  ROS: Denies fever, malais, weight loss, blurry vision, decreased visual acuity, cough, sputum, SOB, hemoptysis, pleuritic pain, palpitaitons, heartburn, abdominal pain, melena, lower extremity edema, claudication, or rash.  All other systems reviewed and negative  General: Affect appropriate Healthy:  appears stated  age HEENT: normal Neck supple with no adenopathy JVP normal no bruits no thyromegaly Lungs clear with no wheezing and good diaphragmatic motion Heart:  S1/S2 no murmur, no rub, gallop or click PMI normal Abdomen: benighn, BS positve, no tenderness, no AAA no bruit.  No HSM or HJR Distal pulses intact with no bruits No edema Neuro non-focal Skin warm and dry No muscular weakness   Current Outpatient Prescriptions  Medication Sig Dispense Refill  . aspirin EC 81 MG tablet Take 81 mg by mouth every other day.      Marland Kitchen azithromycin (ZITHROMAX) 250 MG tablet Take 2 tabs on day 1  6 each  0  . bethanechol (URECHOLINE) 5 MG tablet       . budesonide-formoterol (SYMBICORT) 160-4.5 MCG/ACT inhaler Inhale 2 puffs into the lungs 2 (two) times daily.      Marland Kitchen diltiazem (CARDIZEM CD) 180 MG 24 hr capsule Take 1 capsule (180 mg total) by mouth daily.  30 capsule  0  . finasteride (PROSCAR) 5 MG tablet Take 5 mg by mouth every morning.       . furosemide (LASIX) 20 MG tablet Take 1 tablet (20 mg total) by mouth daily. HOLD PER SCOTT WEAVER, PAC 08/20/12; TAKE PRN WT GAIN 2-3 LB      . HYDROcodone-acetaminophen (NORCO/VICODIN) 5-325 MG per tablet Take 1-2 tablets by mouth every 4 (four) hours as needed.  30 tablet  0  . insulin detemir (LEVEMIR) 100 UNIT/ML injection Inject 18 Units into the  skin at bedtime.  10 mL  0  . loperamide (IMODIUM A-D) 2 MG tablet Take 2 mg by mouth 4 (four) times daily as needed for diarrhea or loose stools.      . magnesium oxide (MAG-OX 400) 400 MG tablet Take 1 tablet (400 mg total) by mouth daily.  15 tablet  0  . metFORMIN (GLUCOPHAGE-XR) 500 MG 24 hr tablet Take 2 tablets (1,000 mg total) by mouth 2 (two) times daily. Please hold for 48hrs after heart catheterization. Resume on 07/15/12      . metoprolol (LOPRESSOR) 100 MG tablet Take 0.5 tablets (50 mg total) by mouth 2 (two) times daily.  60 tablet  0  . potassium chloride (K-DUR) 10 MEQ tablet Take 1 tablet (10 mEq total)  by mouth daily. HOLD PER SCOTT WEAVER, PAC 08/20/12; TAKE PRN W/LASIX FOR WT GAIN 2-3 LB      . Tamsulosin HCl (FLOMAX) 0.4 MG CAPS Take 0.4 mg by mouth every morning.       . Ticagrelor (BRILINTA) 90 MG TABS tablet Take 1 tablet (90 mg total) by mouth 2 (two) times daily.  60 tablet  0  . warfarin (COUMADIN) 3 MG tablet Take 4 mg by mouth daily at 6 PM.       No current facility-administered medications for this visit.    Allergies  Review of patient's allergies indicates no known allergies.  Electrocardiogram: afib rate 30 old IMI  Assessment and Plan

## 2012-08-31 NOTE — Patient Instructions (Addendum)
Your physician recommends that you schedule a follow-up appointment in: 3-4 weeks with Dr Eden Emms or a PA  Your physician has recommended you make the following change in your medication: HOLD Warfarin till you come back

## 2012-08-31 NOTE — Progress Notes (Signed)
   Department of Radiation Oncology  Phone:  [phone] Fax:        [phone]  I spoke with patient's daughter this evening, Darlene concerning antibiotic therapy for the patient. The patient's pharmacist question  starting Zithromax in light of his recent C. difficile infection.  The patient has not had any diarrhea in the past 48 hours. He however did require vancomycin to treat his prior C. difficile infection.  Patient is scheduled to see his pulmonologist tomorrow and have asked that the patient stop by our department to donate a stool sample or  a sample will be obtained by digital exam tomorrow.  I have asked family members to check his temperature regularly. If he deteriorates over the next 12  hours then he should be taken to the emergency room.  The patient will not start Zithromax at this time.  -----------------------------------  [name], PhD, MD

## 2012-08-31 NOTE — Telephone Encounter (Signed)
Opened in error

## 2012-08-31 NOTE — Assessment & Plan Note (Signed)
Leukocytosis Antibiotics called in by Dr Roselind Messier Patient knows to pick up at [facility]

## 2012-08-31 NOTE — Assessment & Plan Note (Signed)
Stable with no angina and good activity level.  Continue medical Rx Continue brilinta

## 2012-08-31 NOTE — Assessment & Plan Note (Signed)
Well controlled.  Continue current medications and low sodium Dash type diet.    

## 2012-08-31 NOTE — Progress Notes (Signed)
[facility]    Radiation Oncology [address] [place]     [name], M.D. [place], [place] [address]               [name], M.D., Ph.D. Phone: [phone]      [name] A. Kathrynn Running, M.D. Fax: [phone]      [name], M.D., Ph.D.         [name], M.D.         [name], M.D Weekly Treatment Management Note  Name: Brandon Clay     MRN: [id number]        CSN: [id number] Date: 08/31/2012      DOB: 1935-03-19  CC: Brandon Miyamoto, MD         [name]    Status: Outpatient  Diagnosis: The encounter diagnosis was Squamous cell lung cancer, left.  Current Dose: 36 Gy  Current Fraction: 3/5  Planned Dose: 60 Gy  Narrative: Brandon Clay was seen today for weekly treatment management. The chart was checked and CBCT  were reviewed. He is tolerating his treatments well at this time. He does however continue to have significant amounts of fatigue. He is feeling better after receiving IV fluids late last week. He denies any breathing problems.  Food tastes well but he has noticed reddish discoloration to his tongue.  Review of patient's allergies indicates no known allergies.  Current Outpatient Prescriptions  Medication Sig Dispense Refill  . aspirin EC 81 MG tablet Take 81 mg by mouth every other day.      . bethanechol (URECHOLINE) 5 MG tablet       . budesonide-formoterol (SYMBICORT) 160-4.5 MCG/ACT inhaler Inhale 2 puffs into the lungs 2 (two) times daily.      Marland Kitchen diltiazem (CARDIZEM CD) 180 MG 24 hr capsule Take 1 capsule (180 mg total) by mouth daily.  30 capsule  0  . finasteride (PROSCAR) 5 MG tablet Take 5 mg by mouth every morning.       . furosemide (LASIX) 20 MG tablet Take 1 tablet (20 mg total) by mouth daily. HOLD PER SCOTT WEAVER, PAC 08/20/12; TAKE PRN WT GAIN 2-3 LB      . HYDROcodone-acetaminophen (NORCO/VICODIN) 5-325 MG per tablet Take 1-2 tablets by mouth every 4 (four) hours as needed.  30 tablet  0  . insulin  detemir (LEVEMIR) 100 UNIT/ML injection Inject 18 Units into the skin at bedtime.  10 mL  0  . loperamide (IMODIUM A-D) 2 MG tablet Take 2 mg by mouth 4 (four) times daily as needed for diarrhea or loose stools.      . magnesium oxide (MAG-OX 400) 400 MG tablet Take 1 tablet (400 mg total) by mouth daily.  15 tablet  0  . metFORMIN (GLUCOPHAGE-XR) 500 MG 24 hr tablet Take 2 tablets (1,000 mg total) by mouth 2 (two) times daily. Please hold for 48hrs after heart catheterization. Resume on 07/15/12      . metoprolol (LOPRESSOR) 100 MG tablet Take 0.5 tablets (50 mg total) by mouth 2 (two) times daily.  60 tablet  0  . potassium chloride (K-DUR) 10 MEQ tablet Take 1 tablet (10 mEq total) by mouth daily. HOLD PER SCOTT WEAVER, PAC 08/20/12; TAKE PRN W/LASIX FOR WT GAIN 2-3 LB      . Tamsulosin HCl (FLOMAX) 0.4 MG CAPS Take 0.4 mg by mouth every morning.       . Ticagrelor (BRILINTA) 90 MG TABS tablet Take  1 tablet (90 mg total) by mouth 2 (two) times daily.  60 tablet  0  . warfarin (COUMADIN) 3 MG tablet Take 4 mg by mouth daily at 6 PM.       No current facility-administered medications for this encounter.   Labs:  Lab Results  Component Value Date   WBC 21.4* 08/31/2012   HGB 8.9* 08/31/2012   HCT 27.6* 08/31/2012   MCV 80.9 08/31/2012   PLT 252 08/31/2012   Lab Results  Component Value Date   CREATININE 1.5* 08/25/2012   BUN 23.5 08/25/2012   NA 137 08/25/2012   K 3.4* 08/25/2012   CL 105 08/25/2012   CO2 20* 08/25/2012   Lab Results  Component Value Date   ALT 10 07/24/2012   AST 9 07/24/2012   PHOS 2.3 08/02/2012   BILITOT 0.4 07/24/2012    Physical Examination:  weight is 167 lb 11.2 oz (76.068 kg).    Wt Readings from Last 3 Encounters:  08/31/12 167 lb 11.2 oz (76.068 kg)  08/27/12 168 lb 4.8 oz (76.34 kg)  08/25/12 165 lb 4.8 oz (74.98 kg)    The tongue has a reddish discoloration.  There is no obvious candidiasis infection in the oral cavity or posterior pharynx. Lungs - Normal  respiratory effort, chest expands symmetrically. Lungs are clear to auscultation, no crackles or wheezes.  Heart has regular rhythm and rate  Abdomen is soft and non tender with normal bowel sounds  Assessment:  Patient tolerating treatments well.  He has a significant elevation of his white count with a left shift.  ANC is 19.0.  He is also having unexplained significant fatigue. Presumably the patient has infectious process will be placed on the antibiotics.  His treatment planning CT scan today did not show obvious pneumonia but only scanned through a portion of his lungs.  He will be placed on Avelox or eqivalent for presumed of bronchitis/pneumonia  Plan: Continue treatment per original radiation prescription.  He will see his cardiologist later today.

## 2012-08-31 NOTE — Addendum Note (Signed)
Encounter addended by: [name], MD on: 08/31/2012  8:08 PM<BR>     Documentation filed: Notes Section

## 2012-08-31 NOTE — Progress Notes (Signed)
Received patient and his wife in the clinic today following treatment. Patient is alert and oriented to person, place, and time. No distress noted. Patient being pushed in wheelchair. Flat affect noted. Patient denies pain at this time. Patient denies painful or difficult swallowing. Wife expressed concern about meaty red appearance of tongue. No white papules noted in oral mucosa. Blood pressure low but, not orthostatic. Patient reports that he is feeling better. Patient denies being weak or dizzy. Reported all findings to Dr. Roselind Messier.

## 2012-08-31 NOTE — Assessment & Plan Note (Signed)
F/U DR Roselind Messier and [name] Currently no plans for thoracotomy

## 2012-09-01 ENCOUNTER — Other Ambulatory Visit (INDEPENDENT_AMBULATORY_CARE_PROVIDER_SITE_OTHER): Payer: Medicare Other

## 2012-09-01 ENCOUNTER — Telehealth: Payer: Self-pay

## 2012-09-01 ENCOUNTER — Ambulatory Visit (INDEPENDENT_AMBULATORY_CARE_PROVIDER_SITE_OTHER): Payer: Medicare Other | Admitting: Internal Medicine

## 2012-09-01 VITALS — BP 126/72 | HR 110 | Ht 70.0 in | Wt 169.8 lb

## 2012-09-01 DIAGNOSIS — D649 Anemia, unspecified: Secondary | ICD-10-CM

## 2012-09-01 DIAGNOSIS — J4489 Other specified chronic obstructive pulmonary disease: Secondary | ICD-10-CM

## 2012-09-01 DIAGNOSIS — D638 Anemia in other chronic diseases classified elsewhere: Secondary | ICD-10-CM

## 2012-09-01 DIAGNOSIS — R5381 Other malaise: Secondary | ICD-10-CM

## 2012-09-01 DIAGNOSIS — J449 Chronic obstructive pulmonary disease, unspecified: Secondary | ICD-10-CM

## 2012-09-01 LAB — BASIC METABOLIC PANEL
BUN: 24 mg/dL — ABNORMAL HIGH (ref 6–23)
Chloride: 102 mEq/L (ref 96–112)
Creatinine, Ser: 1.3 mg/dL (ref 0.4–1.5)
Glucose, Bld: 161 mg/dL — ABNORMAL HIGH (ref 70–99)
Potassium: 3.9 mEq/L (ref 3.5–5.1)

## 2012-09-01 LAB — CBC
Hemoglobin: 9.6 g/dL — ABNORMAL LOW (ref 13.0–17.0)
MCHC: 32.8 g/dL (ref 30.0–36.0)
MCV: 81.3 fl (ref 78.0–100.0)
Platelets: 307 10*3/uL (ref 150.0–400.0)
RBC: 3.6 Mil/uL — ABNORMAL LOW (ref 4.22–5.81)

## 2012-09-01 LAB — MAGNESIUM: Magnesium: 1.2 mg/dL — ABNORMAL LOW (ref 1.5–2.5)

## 2012-09-01 NOTE — Telephone Encounter (Signed)
Spoke with Brandon Clay regarding stool specimen, what she has must be thrown out.Informed if patient has another loose stool to collect and keep refrigerated until specimen can be brought to lab.She will have daughter to come by to pick up specimen cup with written directions.

## 2012-09-01 NOTE — Progress Notes (Signed)
Subjective:    Patient ID: Brandon Clay, male    DOB: 1934/06/21, 78 y.o.   MRN: [id number]  HPI 78 year old male. Patient was last seen in February 2014 a month ago in the intensive care unit for Clostridium difficile diarrhea and hypotension. He now presents for COPD followup. In the interim, a decision was made not to operate on his left upper lobe lung cancer and he is on currently curative radiation treatment. Of note, he has coronary artery disease status post stenting because he was too high risk for surgery. He is being followed at [facility] cardiology.  Office visit 09/01/2012 is for COPD. At this point he is denying any major shortness of breath or cough. In fact on his COPD cat score dyspnea and cough are not prominent symptoms but instead fatigue and deconditioning are. It looks like since his admission in mid February he improved but over a week ago he fell presumably due to orthostasis and since then is having orthostatic dizziness. Her blood pressure checked a few days ago atd radiation oncology revealed orthostatic hypotension. Today in our office as well his standing systolic was 80 while sitting systolic was  126.Marland Kitchen Tthe family is most concerned about his orthostasis . Of note, he is on Lopressor at a reduced dose but despite the reduction in dose he continues to orthostasis. They deny being on other bp med though cardizem is noted on med list. They have stopped his lasix as well. Blood test yesterday in oncology revealed high white count and he's been given erythromycin though he is denying respiratory symptoms.He feels this issue is beyond complexity of his PMD   He continues his Symbicort    CAT COPD Symptom & Quality of Life Score (GSK trademark) 0 is no burden. 5 is highest burden 09/01/2012   Never Cough -> Cough all the time 2  No phlegm in chest -> Chest is full of phlegm 2  No chest tightness -> Chest feels very tight 0  No dyspnea for 1 flight stairs/hill -> Very  dyspneic for 1 flight of stairs 4  No limitations for ADL at home -> Very limited with ADL at home 5  Confident leaving home -> Not at all confident leaving home 1  Sleep soundly -> Do not sleep soundly because of lung condition 1  Lots of Energy -> No energy at all 5  TOTAL Score (max 40)  20    Recent Labs Lab 08/25/12 1520 08/31/12 1305  HGB 8.6* 8.9*  HCT 26.7* 27.6*  WBC 13.1* 21.4*  PLT 171 252    Recent Labs Lab 08/25/12 1520 08/31/12 1305  NA 137 137  K 3.4* 3.9  CL 105 105  CO2 20* 21*  GLUCOSE 124* 103*  BUN 23.5 22.5  CREATININE 1.5* 1.1  CALCIUM 9.0 8.2*      Review of Systems  Constitutional: Positive for fatigue. Negative for fever and unexpected weight change.  HENT: Negative for ear pain, nosebleeds, congestion, sore throat, rhinorrhea, sneezing, trouble swallowing, dental problem, postnasal drip and sinus pressure.   Eyes: Negative for redness and itching.  Respiratory: Positive for cough and shortness of breath. Negative for chest tightness and wheezing.   Cardiovascular: Negative for palpitations and leg swelling.  Gastrointestinal: Negative for nausea and vomiting.  Genitourinary: Negative for dysuria.  Musculoskeletal: Negative for joint swelling.  Skin: Negative for rash.  Neurological: Positive for dizziness and light-headedness. Negative for headaches.  Hematological: Does not bruise/bleed easily.  Psychiatric/Behavioral: Negative for dysphoric  mood. The patient is not nervous/anxious.    Current outpatient prescriptions:aspirin EC 81 MG tablet, Take 81 mg by mouth every other day., Disp: , Rfl: ;  bethanechol (URECHOLINE) 5 MG tablet, , Disp: , Rfl: ;  budesonide-formoterol (SYMBICORT) 160-4.5 MCG/ACT inhaler, Inhale 2 puffs into the lungs 2 (two) times daily., Disp: , Rfl: ;  diltiazem (CARDIZEM CD) 180 MG 24 hr capsule, Take 1 capsule (180 mg total) by mouth daily., Disp: 30 capsule, Rfl: 0 finasteride (PROSCAR) 5 MG tablet, Take 5 mg by  mouth every morning. , Disp: , Rfl: ;  HYDROcodone-acetaminophen (NORCO/VICODIN) 5-325 MG per tablet, Take 1-2 tablets by mouth every 4 (four) hours as needed., Disp: 30 tablet, Rfl: 0;  insulin detemir (LEVEMIR) 100 UNIT/ML injection, Inject 18 Units into the skin at bedtime., Disp: 10 mL, Rfl: 0 loperamide (IMODIUM A-D) 2 MG tablet, Take 2 mg by mouth 4 (four) times daily as needed for diarrhea or loose stools., Disp: , Rfl: ;  metFORMIN (GLUCOPHAGE-XR) 500 MG 24 hr tablet, Take 1,000 mg by mouth 2 (two) times daily., Disp: , Rfl: ;  metoprolol (LOPRESSOR) 100 MG tablet, Take 0.5 tablets (50 mg total) by mouth 2 (two) times daily., Disp: 60 tablet, Rfl: 0 potassium chloride (K-DUR) 10 MEQ tablet, Take 1 tablet (10 mEq total) by mouth daily. HOLD PER SCOTT WEAVER, [facility] 08/20/12; TAKE PRN W/LASIX FOR WT GAIN 2-3 LB, Disp: , Rfl: ;  Tamsulosin HCl (FLOMAX) 0.4 MG CAPS, Take 0.4 mg by mouth every morning. , Disp: , Rfl: ;  Ticagrelor (BRILINTA) 90 MG TABS tablet, Take 1 tablet (90 mg total) by mouth 2 (two) times daily., Disp: 60 tablet, Rfl: 0 azithromycin (ZITHROMAX) 250 MG tablet, Take 2 tabs on day 1, Disp: 6 each, Rfl: 0;  furosemide (LASIX) 20 MG tablet, Take 1 tablet (20 mg total) by mouth daily. HOLD PER SCOTT WEAVER, PAC 08/20/12; TAKE PRN WT GAIN 2-3 LB, Disp: , Rfl: ;  warfarin (COUMADIN) 3 MG tablet, Take 4 mg by mouth daily at 6 PM., Disp: , Rfl:      Objective:   Physical Exam  Nursing note and vitals reviewed. Constitutional: He is oriented to person, place, and time. He appears well-developed and well-nourished. No distress.  decondtioned looking male sitting in wheel chair  HENT:  Head: Normocephalic and atraumatic.  Right Ear: External ear normal.  Left Ear: External ear normal.  Mouth/Throat: Oropharynx is clear and moist. No oropharyngeal exudate.  Eyes: Conjunctivae and EOM are normal. Pupils are equal, round, and reactive to light. Right eye exhibits no discharge. Left eye exhibits  no discharge. No scleral icterus.  Neck: Normal range of motion. Neck supple. No JVD present. No tracheal deviation present. No thyromegaly present.  Cardiovascular: Normal rate, regular rhythm and intact distal pulses.  Exam reveals no gallop and no friction rub.   No murmur heard. Pulmonary/Chest: Effort normal and breath sounds normal. No respiratory distress. He has no wheezes. He has no rales. He exhibits no tenderness.  Abdominal: Soft. Bowel sounds are normal. He exhibits no distension and no mass. There is no tenderness. There is no rebound and no guarding.  Musculoskeletal: Normal range of motion. He exhibits no edema and no tenderness.  Lymphadenopathy:    He has no cervical adenopathy.  Neurological: He is alert and oriented to person, place, and time. He has normal reflexes. No cranial nerve deficit. Coordination normal.  Skin: Skin is warm and dry. No rash noted. He is not diaphoretic.  No erythema. No pallor.  Psychiatric: Judgment and thought content normal.  Flat affect          Assessment & Plan:

## 2012-09-01 NOTE — Patient Instructions (Addendum)
#  COPD This appears stable continue Symbicort At followup we will do pulmonary function test  #Orthostatic dizziness and fatigue and high white count - Stop the Lopressor - I discussed this with Dr. Peter [place] of cardiology - Repeat lab tests including magnesium and phosphorus in the the lab today; will call you with the results - Monitor blood pressure supine and standing at home for the next few to several days if the blood pressure drops in the standing position please let us know  #Followup 2 months for COPD with pulmonary function test

## 2012-09-02 ENCOUNTER — Telehealth: Payer: Self-pay

## 2012-09-02 ENCOUNTER — Ambulatory Visit
Admission: RE | Admit: 2012-09-02 | Discharge: 2012-09-02 | Disposition: A | Discharge status: Home / Self Care | Payer: Medicare Other | Source: Ambulatory Visit | Attending: Radiation Oncology | Admitting: Radiation Oncology

## 2012-09-02 ENCOUNTER — Encounter (HOSPITAL_COMMUNITY): Payer: Self-pay

## 2012-09-02 ENCOUNTER — Telehealth: Payer: Self-pay | Admitting: Oncology

## 2012-09-02 ENCOUNTER — Other Ambulatory Visit: Payer: Medicare Other

## 2012-09-02 ENCOUNTER — Ambulatory Visit: Payer: Medicare Other

## 2012-09-02 ENCOUNTER — Inpatient Hospital Stay (HOSPITAL_COMMUNITY)
Admission: AD | Admit: 2012-09-02 | Discharge: 2012-09-17 | DRG: 871 | Disposition: A | Discharge status: Skilled Nursing Facility | Payer: Medicare Other | Source: Ambulatory Visit | Attending: Internal Medicine | Admitting: Internal Medicine

## 2012-09-02 ENCOUNTER — Telehealth: Payer: Self-pay | Admitting: Internal Medicine

## 2012-09-02 ENCOUNTER — Ambulatory Visit
Admission: RE | Admit: 2012-09-02 | Discharge: 2012-09-02 | Disposition: A | Discharge status: Home / Self Care | Payer: Medicare Other | Source: Ambulatory Visit | Admitting: Radiation Oncology

## 2012-09-02 DIAGNOSIS — I2510 Atherosclerotic heart disease of native coronary artery without angina pectoris: Secondary | ICD-10-CM

## 2012-09-02 DIAGNOSIS — E8770 Fluid overload, unspecified: Secondary | ICD-10-CM | POA: Diagnosis not present

## 2012-09-02 DIAGNOSIS — I798 Other disorders of arteries, arterioles and capillaries in diseases classified elsewhere: Secondary | ICD-10-CM | POA: Diagnosis present

## 2012-09-02 DIAGNOSIS — J4489 Other specified chronic obstructive pulmonary disease: Secondary | ICD-10-CM | POA: Diagnosis present

## 2012-09-02 DIAGNOSIS — E872 Acidosis, unspecified: Secondary | ICD-10-CM

## 2012-09-02 DIAGNOSIS — J189 Pneumonia, unspecified organism: Secondary | ICD-10-CM

## 2012-09-02 DIAGNOSIS — C3492 Malignant neoplasm of unspecified part of left bronchus or lung: Secondary | ICD-10-CM

## 2012-09-02 DIAGNOSIS — L03115 Cellulitis of right lower limb: Secondary | ICD-10-CM

## 2012-09-02 DIAGNOSIS — R5381 Other malaise: Secondary | ICD-10-CM | POA: Diagnosis present

## 2012-09-02 DIAGNOSIS — J984 Other disorders of lung: Secondary | ICD-10-CM

## 2012-09-02 DIAGNOSIS — L02419 Cutaneous abscess of limb, unspecified: Secondary | ICD-10-CM | POA: Diagnosis present

## 2012-09-02 DIAGNOSIS — A0472 Enterocolitis due to Clostridium difficile, not specified as recurrent: Secondary | ICD-10-CM

## 2012-09-02 DIAGNOSIS — I2589 Other forms of chronic ischemic heart disease: Secondary | ICD-10-CM | POA: Diagnosis present

## 2012-09-02 DIAGNOSIS — I10 Essential (primary) hypertension: Secondary | ICD-10-CM | POA: Diagnosis present

## 2012-09-02 DIAGNOSIS — R0902 Hypoxemia: Secondary | ICD-10-CM | POA: Diagnosis not present

## 2012-09-02 DIAGNOSIS — I4891 Unspecified atrial fibrillation: Secondary | ICD-10-CM | POA: Diagnosis present

## 2012-09-02 DIAGNOSIS — J449 Chronic obstructive pulmonary disease, unspecified: Secondary | ICD-10-CM

## 2012-09-02 DIAGNOSIS — J700 Acute pulmonary manifestations due to radiation: Secondary | ICD-10-CM

## 2012-09-02 DIAGNOSIS — D638 Anemia in other chronic diseases classified elsewhere: Secondary | ICD-10-CM

## 2012-09-02 DIAGNOSIS — E119 Type 2 diabetes mellitus without complications: Secondary | ICD-10-CM | POA: Diagnosis present

## 2012-09-02 DIAGNOSIS — D630 Anemia in neoplastic disease: Secondary | ICD-10-CM | POA: Diagnosis present

## 2012-09-02 DIAGNOSIS — Z7901 Long term (current) use of anticoagulants: Secondary | ICD-10-CM

## 2012-09-02 DIAGNOSIS — E876 Hypokalemia: Secondary | ICD-10-CM

## 2012-09-02 DIAGNOSIS — E7800 Pure hypercholesterolemia, unspecified: Secondary | ICD-10-CM | POA: Diagnosis present

## 2012-09-02 DIAGNOSIS — R531 Weakness: Secondary | ICD-10-CM | POA: Diagnosis present

## 2012-09-02 DIAGNOSIS — C3490 Malignant neoplasm of unspecified part of unspecified bronchus or lung: Secondary | ICD-10-CM

## 2012-09-02 DIAGNOSIS — N400 Enlarged prostate without lower urinary tract symptoms: Secondary | ICD-10-CM

## 2012-09-02 DIAGNOSIS — J9600 Acute respiratory failure, unspecified whether with hypoxia or hypercapnia: Secondary | ICD-10-CM

## 2012-09-02 DIAGNOSIS — J439 Emphysema, unspecified: Secondary | ICD-10-CM | POA: Diagnosis present

## 2012-09-02 DIAGNOSIS — I119 Hypertensive heart disease without heart failure: Secondary | ICD-10-CM

## 2012-09-02 DIAGNOSIS — E1159 Type 2 diabetes mellitus with other circulatory complications: Secondary | ICD-10-CM

## 2012-09-02 DIAGNOSIS — D72829 Elevated white blood cell count, unspecified: Secondary | ICD-10-CM

## 2012-09-02 DIAGNOSIS — E860 Dehydration: Secondary | ICD-10-CM

## 2012-09-02 DIAGNOSIS — C3410 Malignant neoplasm of upper lobe, unspecified bronchus or lung: Secondary | ICD-10-CM | POA: Diagnosis present

## 2012-09-02 DIAGNOSIS — B3749 Other urogenital candidiasis: Secondary | ICD-10-CM | POA: Diagnosis present

## 2012-09-02 DIAGNOSIS — A419 Sepsis, unspecified organism: Principal | ICD-10-CM | POA: Diagnosis present

## 2012-09-02 DIAGNOSIS — I959 Hypotension, unspecified: Secondary | ICD-10-CM

## 2012-09-02 DIAGNOSIS — E43 Unspecified severe protein-calorie malnutrition: Secondary | ICD-10-CM

## 2012-09-02 DIAGNOSIS — I5031 Acute diastolic (congestive) heart failure: Secondary | ICD-10-CM

## 2012-09-02 LAB — CBC WITH DIFFERENTIAL/PLATELET
Basophils Relative: 0 % (ref 0–1)
Eosinophils Absolute: 0 10*3/uL (ref 0.0–0.5)
HCT: 25.2 % — ABNORMAL LOW (ref 38.4–49.9)
HCT: 26.5 % — ABNORMAL LOW (ref 39.0–52.0)
Hemoglobin: 8.8 g/dL — ABNORMAL LOW (ref 13.0–17.0)
LYMPH%: 1.5 % — ABNORMAL LOW (ref 14.0–49.0)
MCH: 26.8 pg (ref 26.0–34.0)
MCHC: 33.2 g/dL (ref 30.0–36.0)
MONO#: 1.5 10*3/uL — ABNORMAL HIGH (ref 0.1–0.9)
Monocytes Absolute: 1.3 10*3/uL — ABNORMAL HIGH (ref 0.1–1.0)
Monocytes Relative: 4 % (ref 3–12)
NEUT#: 30.7 10*3/uL — ABNORMAL HIGH (ref 1.5–6.5)
NEUT%: 94 % — ABNORMAL HIGH (ref 39.0–75.0)
Neutro Abs: 29.8 10*3/uL — ABNORMAL HIGH (ref 1.7–7.7)
Platelets: 242 10*3/uL (ref 140–400)
WBC: 32.7 10*3/uL — ABNORMAL HIGH (ref 4.0–10.3)

## 2012-09-02 LAB — HEMOGLOBIN A1C
Hgb A1c MFr Bld: 6.7 % — ABNORMAL HIGH (ref <5.7)
Mean Plasma Glucose: 146 mg/dL — ABNORMAL HIGH (ref <117)

## 2012-09-02 LAB — COMPREHENSIVE METABOLIC PANEL
ALT: 7 U/L (ref 0–53)
AST: 12 U/L (ref 0–37)
Albumin: 2.4 g/dL — ABNORMAL LOW (ref 3.5–5.2)
Alkaline Phosphatase: 115 U/L (ref 39–117)
Chloride: 98 mEq/L (ref 96–112)
Potassium: 3.6 mEq/L (ref 3.5–5.1)
Total Bilirubin: 0.4 mg/dL (ref 0.3–1.2)

## 2012-09-02 LAB — COMPREHENSIVE METABOLIC PANEL (CC13)
CO2: 19 mEq/L — ABNORMAL LOW (ref 22–29)
Creatinine: 1.2 mg/dL (ref 0.7–1.3)
Glucose: 114 mg/dl — ABNORMAL HIGH (ref 70–99)
Total Bilirubin: 0.54 mg/dL (ref 0.20–1.20)
Total Protein: 5.4 g/dL — ABNORMAL LOW (ref 6.4–8.3)

## 2012-09-02 LAB — GLUCOSE, CAPILLARY

## 2012-09-02 LAB — PROTIME-INR: Prothrombin Time: 32 seconds — ABNORMAL HIGH (ref 11.6–15.2)

## 2012-09-02 MED ORDER — AMIODARONE HCL IN DEXTROSE 360-4.14 MG/200ML-% IV SOLN
30.0000 mg/h | INTRAVENOUS | Status: DC
  Administered 2012-09-02: 60 mg/h via INTRAVENOUS
  Administered 2012-09-03: 30 mg/h via INTRAVENOUS
  Administered 2012-09-03: 60 mg/h via INTRAVENOUS
  Administered 2012-09-04 (×3): 30 mg/h via INTRAVENOUS
  Filled 2012-09-02 (×5): qty 200

## 2012-09-02 MED ORDER — INSULIN DETEMIR 100 UNIT/ML ~~LOC~~ SOLN
18.0000 [IU] | Freq: Every day | SUBCUTANEOUS | Status: DC
  Administered 2012-09-02: 18 [IU] via SUBCUTANEOUS
  Filled 2012-09-02: qty 0.18

## 2012-09-02 MED ORDER — ACETAMINOPHEN 325 MG PO TABS: 650.0000 mg | ORAL_TABLET | Freq: Four times a day (QID) | ORAL | Status: DC | PRN

## 2012-09-02 MED ORDER — SODIUM CHLORIDE 0.9 % IV SOLN
INTRAVENOUS | Status: DC
  Administered 2012-09-02 – 2012-09-05 (×5): via INTRAVENOUS

## 2012-09-02 MED ORDER — FINASTERIDE 5 MG PO TABS
5.0000 mg | ORAL_TABLET | Freq: Every morning | ORAL | Status: DC
  Administered 2012-09-02 – 2012-09-08 (×7): 5 mg via ORAL
  Filled 2012-09-02 (×7): qty 1

## 2012-09-02 MED ORDER — METRONIDAZOLE IN NACL 5-0.79 MG/ML-% IV SOLN
500.0000 mg | Freq: Three times a day (TID) | INTRAVENOUS | Status: DC
  Administered 2012-09-02 – 2012-09-13 (×33): 500 mg via INTRAVENOUS
  Filled 2012-09-02 (×35): qty 100

## 2012-09-02 MED ORDER — INSULIN ASPART 100 UNIT/ML ~~LOC~~ SOLN
0.0000 [IU] | Freq: Three times a day (TID) | SUBCUTANEOUS | Status: DC
  Administered 2012-09-02 – 2012-09-03 (×2): 2 [IU] via SUBCUTANEOUS
  Administered 2012-09-03 – 2012-09-04 (×2): 3 [IU] via SUBCUTANEOUS

## 2012-09-02 MED ORDER — METOPROLOL TARTRATE 50 MG PO TABS
50.0000 mg | ORAL_TABLET | Freq: Two times a day (BID) | ORAL | Status: DC
  Administered 2012-09-02 – 2012-09-08 (×11): 50 mg via ORAL
  Filled 2012-09-02 (×14): qty 1

## 2012-09-02 MED ORDER — TAMSULOSIN HCL 0.4 MG PO CAPS
0.4000 mg | ORAL_CAPSULE | Freq: Every morning | ORAL | Status: DC
  Administered 2012-09-02 – 2012-09-08 (×7): 0.4 mg via ORAL
  Filled 2012-09-02 (×7): qty 1

## 2012-09-02 MED ORDER — ONDANSETRON HCL 4 MG/2ML IJ SOLN: 4.0000 mg | Freq: Four times a day (QID) | INTRAMUSCULAR | Status: DC | PRN

## 2012-09-02 MED ORDER — BUDESONIDE-FORMOTEROL FUMARATE 160-4.5 MCG/ACT IN AERO
2.0000 | INHALATION_SPRAY | Freq: Two times a day (BID) | RESPIRATORY_TRACT | Status: DC
  Administered 2012-09-03 – 2012-09-17 (×29): 2 via RESPIRATORY_TRACT
  Filled 2012-09-02 (×2): qty 6

## 2012-09-02 MED ORDER — ACETAMINOPHEN 650 MG RE SUPP: 650.0000 mg | Freq: Four times a day (QID) | RECTAL | Status: DC | PRN

## 2012-09-02 MED ORDER — INSULIN ASPART 100 UNIT/ML ~~LOC~~ SOLN: 0.0000 [IU] | Freq: Every day | SUBCUTANEOUS | Status: DC

## 2012-09-02 MED ORDER — SODIUM CHLORIDE 0.9 % IV SOLN
1250.0000 mg | INTRAVENOUS | Status: DC
  Administered 2012-09-02 – 2012-09-04 (×3): 1250 mg via INTRAVENOUS
  Filled 2012-09-02 (×4): qty 1250

## 2012-09-02 MED ORDER — DIGOXIN 0.25 MG/ML IJ SOLN
0.2500 mg | Freq: Four times a day (QID) | INTRAMUSCULAR | Status: AC
  Administered 2012-09-02 – 2012-09-03 (×2): 0.25 mg via INTRAVENOUS
  Filled 2012-09-02 (×2): qty 1

## 2012-09-02 MED ORDER — AMIODARONE HCL IN DEXTROSE 360-4.14 MG/200ML-% IV SOLN: 30.0000 mg/h | INTRAVENOUS | Status: DC

## 2012-09-02 MED ORDER — SODIUM CHLORIDE 0.9 % IJ SOLN
3.0000 mL | Freq: Two times a day (BID) | INTRAMUSCULAR | Status: DC
  Administered 2012-09-03 – 2012-09-05 (×3): 3 mL via INTRAVENOUS
  Administered 2012-09-06: 11:00:00 via INTRAVENOUS
  Administered 2012-09-06 – 2012-09-08 (×2): 3 mL via INTRAVENOUS
  Administered 2012-09-09: 11:00:00 via INTRAVENOUS
  Administered 2012-09-10 – 2012-09-17 (×6): 3 mL via INTRAVENOUS

## 2012-09-02 MED ORDER — AMIODARONE HCL IN DEXTROSE 360-4.14 MG/200ML-% IV SOLN
60.0000 mg/h | INTRAVENOUS | Status: DC
Start: 2012-09-02 — End: 2012-09-02

## 2012-09-02 MED ORDER — HYDROCODONE-ACETAMINOPHEN 5-325 MG PO TABS: 1.0000 | ORAL_TABLET | ORAL | Status: DC | PRN

## 2012-09-02 MED ORDER — VANCOMYCIN 50 MG/ML ORAL SOLUTION
125.0000 mg | Freq: Four times a day (QID) | ORAL | Status: DC
  Administered 2012-09-02 – 2012-09-10 (×30): 125 mg via ORAL
  Filled 2012-09-02 (×40): qty 2.5

## 2012-09-02 MED ORDER — DILTIAZEM HCL 100 MG IV SOLR
5.0000 mg/h | INTRAVENOUS | Status: DC
  Administered 2012-09-02: 15 mg/h via INTRAVENOUS
  Administered 2012-09-02 (×2): 5 mg/h via INTRAVENOUS
  Filled 2012-09-02: qty 100

## 2012-09-02 MED ORDER — DILTIAZEM HCL ER COATED BEADS 180 MG PO CP24
180.0000 mg | ORAL_CAPSULE | Freq: Every day | ORAL | Status: DC
  Administered 2012-09-02: 180 mg via ORAL
  Filled 2012-09-02: qty 1

## 2012-09-02 MED ORDER — ONDANSETRON HCL 4 MG PO TABS: 4.0000 mg | ORAL_TABLET | Freq: Four times a day (QID) | ORAL | Status: DC | PRN

## 2012-09-02 MED ORDER — ASPIRIN EC 81 MG PO TBEC
81.0000 mg | DELAYED_RELEASE_TABLET | ORAL | Status: DC
  Administered 2012-09-03 – 2012-09-17 (×8): 81 mg via ORAL
  Filled 2012-09-02 (×8): qty 1

## 2012-09-02 MED ORDER — TICAGRELOR 90 MG PO TABS
90.0000 mg | ORAL_TABLET | Freq: Two times a day (BID) | ORAL | Status: DC
  Filled 2012-09-02: qty 1

## 2012-09-02 MED ORDER — AMIODARONE HCL IN DEXTROSE 360-4.14 MG/200ML-% IV SOLN
INTRAVENOUS | Status: AC
  Filled 2012-09-02: qty 200

## 2012-09-02 NOTE — Telephone Encounter (Signed)
Returned a call to [place], Mr. Mantel's wife.  She stated that he did not sleep last night and has been throwing up.  He had some Gatorade his morning and vomited it back up.  He is feeling weak.  She is wondering if he should come in early for his appointment.  Also, spoke to [place], his daughter, who said that he had his blood pressure taken at the Pulmonologist yesterday and his bp standing was 80 systolic.

## 2012-09-02 NOTE — Progress Notes (Signed)
Patient placed on contact precautions.  He has a history of C.diff and patient's wife stated they had to bring a sample of stool to be tested for c.diff today. She stated the sample was given over at the cancer center.  Contact precautions were explained to the patient and wife.

## 2012-09-02 NOTE — Telephone Encounter (Signed)
Dr. Marchelle Gearing called and advised that the pt needs to take magnesium  Oxide 400mg  tid, and that his WBC is still high. Also the pt needs to stop lopressor and family needs to check orthostatic BP and if pt is still having low BP then he needs to go to ER.  MR also states that the pt phosphorous is not back yet and when I checked on this it looks like it was not drawn. I called lab to verify and it was not done so I faxed an add-on sheet to have the done. MR states that if the pt phos is low then he will need to be admitted. I have [id number] to advise the pt of the above, rx has not been sent yet. Carron Curie, CMA

## 2012-09-02 NOTE — Progress Notes (Signed)
Pt transferred to rm [id number]. Report given to [employer], Charity fundraiser. Pt transferred with belongings, pt stable at time of transfer.

## 2012-09-02 NOTE — Progress Notes (Signed)
Spoke with NP regarding patient's BP. New orders received to titrate gtt back down to 5mg /hr as well as fluids and digoxin.  Oncoming RN made aware of new orders.

## 2012-09-02 NOTE — Progress Notes (Signed)
Shift event: Pt in Afib with RVR today and was started on Cardizem gtt. Seen by cardiology who agreed. However, HR has been better, but pt's BP is not sustaining the Cardizem, even at the lowest amount. Therefore, this NP called cardio on call, Paulo Fruit, MD to get his opinion on Amiodarone. I told Dr. Adolm Joseph I had ordered 2 doses of IV Digoxin q 6hrs and first one had been given. He suggested, for now, to d/c the Cardizem gtt. If HR doesn't hold, then initiate Amiodarone gtt at 60 with no loading dose. RN aware. RN told to let me know if HR becomes sustained 120s or higher or if BP doesn't bounce back.  Jimmye Norman, NP [facility]

## 2012-09-02 NOTE — Progress Notes (Addendum)
ANTIBIOTIC CONSULT NOTE - INITIAL  Pharmacy Consult for Vancomycin Indication: Cellulitis  No Known Allergies  Patient Measurements: Height: 5\' 10"  (177.8 cm) Weight: 149 lb 9.6 oz (67.858 kg) IBW/kg (Calculated) : 73 Adjusted Body Weight:   Vital Signs: Temp: 98.2 F (36.8 C) (03/27 1400) Temp src: Oral (03/27 1400) BP: 107/69 mmHg (03/27 1400) Pulse Rate: 151 (03/27 1400) Intake/Output from previous day:   Intake/Output from this shift:    Labs:  Recent Labs  08/31/12 1305 09/01/12 1453 09/02/12 1052  WBC 21.4* 22.9 Repeated and verified X2.* 32.7*  HGB 8.9* 9.6* 8.0*  PLT 252 307.0 242  CREATININE 1.1 1.3 1.2   Estimated Creatinine Clearance: 51.1 ml/min (by C-G formula based on Cr of 1.2). No results found for this basename: VANCOTROUGH, VANCOPEAK, VANCORANDOM, GENTTROUGH, GENTPEAK, GENTRANDOM, TOBRATROUGH, TOBRAPEAK, TOBRARND, AMIKACINPEAK, AMIKACINTROU, AMIKACIN,  in the last 72 hours   Microbiology: No results found for this or any previous visit (from the past 720 hour(s)).  Medical History: Past Medical History  Diagnosis Date  . Hypertension   . Hypercholesterolemia     statin intolerant  . Diabetes mellitus, type 2   . Benign prostatic hypertrophy   . Emphysema   . Lung cancer     Followed by Dr. [name] (LUL squamous cell carcinoma)  . COPD (chronic obstructive pulmonary disease)   . CKD (chronic kidney disease)   . Colitis   . Coronary artery disease     Cath 07/02/12 3v CAD w/ chronic occlusion of RCA (fills from left collaterals), severe stenosis prox LCx & moderately severe dz in mid LAD; s/p PTCA/BMS to prox LCx 07/12/12   . Normocytic anemia   . Persistent atrial fibrillation   . Atrial enlargement, left     Medications:  Scheduled:  . [START ON 09/03/2012] aspirin EC  81 mg Oral QODAY  . budesonide-formoterol  2 puff Inhalation BID  . diltiazem  180 mg Oral Daily  . finasteride  5 mg Oral q morning - 10a  . insulin detemir  18 Units  Subcutaneous QHS  . metoprolol  50 mg Oral BID  . sodium chloride  3 mL Intravenous Q12H  . tamsulosin  0.4 mg Oral q morning - 10a  . Ticagrelor  90 mg Oral BID   Infusions:  . sodium chloride     PRN: acetaminophen, acetaminophen, HYDROcodone-acetaminophen, ondansetron (ZOFRAN) IV, ondansetron Assessment: 78 yo M  With left lobe squamous cell lung CA.  Recently in ICU for C. Diff diarrhea. WBC quite elevated (32.7)  Goal of Therapy:  Vancomycin trough level 10-15 mcg/ml  Plan:  Begin Vancomycin 1250mg  IV q 24 hours. Measure antibiotic drug levels at steady state Follow up culture results  Loletta Specter 09/02/2012,2:54 PM  Vancomycin 125mg  po qid x 10 days added for C. Diff. --dosing per pharmacy.

## 2012-09-02 NOTE — Telephone Encounter (Signed)
Called Brandon Clay back and told her to bring Brandon Clay in to have labs drawn per Dr. Roselind Messier.  He would also like to see Brandon Clay in the clinic before his treatment.

## 2012-09-02 NOTE — Progress Notes (Signed)
Pt with b/p of 79/42, hr 104, temp 100.3. Pt cardizem at 2.5. [facility] called awaiting call back.

## 2012-09-02 NOTE — Progress Notes (Signed)
[name]    Radiation Oncology [address] [place]     [name], M.D. [name], [place] [address]               [name], M.D., Ph.D. Phone: [phone]      [name] A. Kathrynn Running, M.D. Fax: [phone]      [name], M.D., Ph.D.         [name], M.D.         [name], M.D Weekly Treatment Management Note  Name: Brandon Clay     MRN: [id number]        CSN: [id number] Date: 09/02/2012      DOB: 1935-05-10  CC: [name], MD         [name]    Status: Outpatient  Diagnosis: The encounter diagnosis was Squamous cell lung cancer, left.  Current Dose: 48  Current Fraction: 4/5  Planned Dose: 60 Gy directed at the left upper chest mass  Narrative: Brandon Clay was seen today for weekly treatment management. The chart was checked and CBCT  were reviewed. The patient presented today for his fourth high-dose-rate treatment. Yesterday evening the patient had problems with nausea and emesis. He is also had some diarrhea this morning but no blood in his stools. Patient continues to feel quite weak and dizzy with standing.  He denies any urinary symptoms symptoms or hematuria.  Review of patient's allergies indicates no known allergies. No current outpatient prescriptions on file.   No current facility-administered medications for this encounter.   Labs:  Lab Results  Component Value Date   WBC 32.7* 09/02/2012   HGB 8.0* 09/02/2012   HCT 25.2* 09/02/2012   MCV 80.0 09/02/2012   PLT 242 09/02/2012   Lab Results  Component Value Date   CREATININE 1.2 09/02/2012   BUN 24.2 09/02/2012   NA 136 09/02/2012   K 3.6 09/02/2012   CL 105 09/02/2012   CO2 19* 09/02/2012   Lab Results  Component Value Date   ALT 6 09/02/2012   AST 8 09/02/2012   PHOS 2.3 08/02/2012   BILITOT 0.54 09/02/2012    Physical Examination: Temperature is 98.6, heart rate is 136, blood pressure is 111/54  Wt Readings from Last 3 Encounters:  09/02/12  161 lb (73.029 kg)  09/01/12 169 lb 12.8 oz (77.021 kg)  08/31/12 168 lb (76.204 kg)     Lungs - Normal respiratory effort, chest expands symmetrically. Lungs are clear to auscultation except for crackles in the left base Heart has regular rhythm with increased rate Abdomen is soft and non tender with normal bowel sounds The right lower shin area shows an erythematous area where the patient fell off his tractor several days ago.  Assessment:  Patient tolerating treatments well.  The patient's white count and absolute neutrophil count continues to be significantly elevated and has increased over the past 2 days. Antibiotic therapy is tricky for him in light of his recent history of C. difficile infection  requiring vancomycin IV as well as by mouth.  Plan: Continue treatment per original radiation prescription.  The patient will be admitted to the hospitalist service. He likely has a significant bacterial infection, possibly cellulitis or early sepsis.   -----------------------------------  [name], PhD, MD

## 2012-09-02 NOTE — Progress Notes (Signed)
Patient is still on cardizem gtt with a HR between 110-120s. BP was taken prior to titrating gtt for a second time and BP was 75/55 after two attempts.  MD paged with findings.

## 2012-09-02 NOTE — Progress Notes (Signed)
Patient is currently in A.fib with a HR that remains in the 130s/140s after receiving PO Cardizem. MD aware. New orders received for cardizem gtt. Gtt was started with a bolus of 10mg  and a rate of 5mg /hr. Centralized monitoring aware of patient starting gtt. Will continue to monitor and titrate gtt as needed.

## 2012-09-02 NOTE — Consult Note (Signed)
HPI: 78 year old male with past medical history of coronary artery disease, atrial fibrillation, lung cancer and Clostridium difficile colitis for evaluation of atrial fibrillation with a rapid ventricular response. Patient has had a recent long complicated history. He established with Dr. Eden Emms in 06/2012 for surgical clearance for lobectomy for lung cancer. Myoview was abnormal with EF 45%, lateral and inferolateral scar with peri-infarct ischemia. LHC 07/02/12: dLM 20%, mLAD 60%, small D2 99%, pCFX 99%, dAVCFX 40%, pRCA 80%, mRCA 100%, distal filled with L-R collats, EF 45%. Patient felt too high risk for CABG. He underwent PCI with BMS to the pCFX on 07/12/12. He had a high PRU on Plavix and was switched to Brilinta. DAPT recommended x at least 1 month. He was then admitted 07/2012 with dehydration and AFib with RVR in the setting of CDiff colitis. He was placed on coumadin. Echo 07/25/12: mild to mod LVH, EF 60-65%, mild BAE. He underwent TEE-DCCV but returned to AFib again. He was seen by Dr. Johney Frame for EP. Best option felt to be rate control and anticoagulation. Options of amiodarone or Tikosyn could be considered after lung surgery. Of note, Dr. [name] saw the patient in the hospital and felt he was too weak to proceed with lung resection and alternative treatments would need to be pursued. Radiation therapy elected and patient received fifth session today. During his radiation session today he was noted to be weak and complained of increasing diarrhea again as well as decreased by mouth intake. Also apparently with low-grade fever but no chills, productive cough or dysuria. He denies dyspnea, recent chest pain, palpitations or syncope.   Medications Prior to Admission  Medication Sig Dispense Refill  . aspirin EC 81 MG tablet Take 81 mg by mouth every other day.      . budesonide-formoterol (SYMBICORT) 160-4.5 MCG/ACT inhaler Inhale 2 puffs into the lungs 2 (two) times daily.      Marland Kitchen diltiazem  (CARDIZEM CD) 180 MG 24 hr capsule Take 180 mg by mouth daily.      . finasteride (PROSCAR) 5 MG tablet Take 5 mg by mouth every morning.       . insulin detemir (LEVEMIR) 100 UNIT/ML injection Inject 18 Units into the skin at bedtime.      Marland Kitchen loperamide (IMODIUM A-D) 2 MG tablet Take 2 mg by mouth 4 (four) times daily as needed for diarrhea or loose stools.      . metFORMIN (GLUCOPHAGE-XR) 500 MG 24 hr tablet Take 1,000 mg by mouth 2 (two) times daily.      . Tamsulosin HCl (FLOMAX) 0.4 MG CAPS Take 0.4 mg by mouth every morning.       . Ticagrelor (BRILINTA) 90 MG TABS tablet Take 1 tablet (90 mg total) by mouth 2 (two) times daily.  60 tablet  0  . [DISCONTINUED] diltiazem (CARDIZEM CD) 180 MG 24 hr capsule Take 1 capsule (180 mg total) by mouth daily.  30 capsule  0  . [DISCONTINUED] insulin detemir (LEVEMIR) 100 UNIT/ML injection Inject 18 Units into the skin at bedtime.  10 mL  0  . metoprolol (LOPRESSOR) 100 MG tablet Take 0.5 tablets (50 mg total) by mouth 2 (two) times daily.  60 tablet  0  . warfarin (COUMADIN) 3 MG tablet Take 4 mg by mouth daily at 6 PM.        No Known Allergies  Past Medical History  Diagnosis Date  . Hypertension   . Hypercholesterolemia     statin intolerant  .  Diabetes mellitus, type 2   . Benign prostatic hypertrophy   . Emphysema   . Lung cancer     Followed by Dr. [name] (LUL squamous cell carcinoma)  . COPD (chronic obstructive pulmonary disease)   . CKD (chronic kidney disease)   . Colitis   . Coronary artery disease     Cath 07/02/12 3v CAD w/ chronic occlusion of RCA (fills from left collaterals), severe stenosis prox LCx & moderately severe dz in mid LAD; s/p PTCA/BMS to prox LCx 07/12/12   . Normocytic anemia   . Persistent atrial fibrillation   . Atrial enlargement, left     Past Surgical History  Procedure Laterality Date  . Hemorrhoid surgery    . Tee without cardioversion N/A 07/29/2012    Procedure: TRANSESOPHAGEAL ECHOCARDIOGRAM  (TEE);  Surgeon: Laurey Morale, MD;  Location[facility] ENDOSCOPY;  Service: Cardiovascular;  Laterality: N/A;  patient coming from [place] room 1236 talk to jennifer at [place]  talk to [facility] from care link will pick up pat. at 8:15 from [place]  . Cardioversion N/A 07/29/2012    Procedure: CARDIOVERSION;  Surgeon: Laurey Morale, MD;  Location: [facility] ENDOSCOPY;  Service: Cardiovascular;  Laterality: N/A;  . Cardiac catheterization    . Coronary angioplasty with stent placement    . Lung biopsy      History   Social History  . Marital Status: Married    Spouse Name: N/A    Number of Children: 1  . Years of Education: N/A   Occupational History  . retired [employer] for over 20 years  . retired     Personnel officer for 10 years   Social History Main Topics  . Smoking status: Former Smoker -- 0.50 packs/day for 50 years    Types: Cigarettes    Start date: 06/09/1958    Quit date: 06/10/2007  . Smokeless tobacco: Never Used  . Alcohol Use: No  . Drug Use: No  . Sexually Active: No   Other Topics Concern  . Not on file   Social History Narrative  . No narrative on file    Family History  Problem Relation Age of Onset  . Stroke Mother   . Heart disease Father     died of heart attack  . Lung cancer Brother   . Hypertension Brother   . Cancer Brother     brain cancer  . Hypothyroidism Brother   . Hyperlipidemia Sister   . Hyperlipidemia Sister     ROS:  Generalized weakness, pain in his right lower shin but no chills, productive cough, hemoptysis, dysphasia, odynophagia, melena, hematochezia, dysuria, hematuria, rash, seizure activity, orthopnea, PND, pedal edema, claudication. Remaining systems are negative.  Physical Exam:   Blood pressure 95/45, pulse 126, temperature 98.7 F (37.1 C), temperature source Oral, resp. rate 18, height 5\' 10"  (1.778 m), weight 149 lb 9.6 oz (67.858 kg), SpO2 94.00%.  General:  Well developed/well nourished  in NAD Skin warm/dry Patient not depressed No peripheral clubbing Back-normal HEENT-normal/normal eyelids Neck supple/normal carotid upstroke bilaterally; no bruits; no JVD; no thyromegaly chest - CTA/ normal expansion CV - irregular and tachycardic/normal S1 and S2; no murmurs, rubs or gallops;  PMI nondisplaced Abdomen -NT/ND, no HSM, no mass, + bowel sounds, no bruit 2+ femoral pulses, no bruits Ext-no chords; trace to 1+ ankle edema; area of cellulitis right lower ext. Neuro-grossly nonfocal  ECG atrial fibrillation with a rapid ventricular response, PVC or aberrantly  conducted beats, nonspecific ST changes.  Results for orders placed during the hospital encounter of 09/02/12 (from the past 48 hour(s))  COMPREHENSIVE METABOLIC PANEL     Status: Abnormal   Collection Time    09/02/12  3:20 PM      Result Value Range   Sodium 134 (*) 135 - 145 mEq/L   Potassium 3.6  3.5 - 5.1 mEq/L   Chloride 98  96 - 112 mEq/L   CO2 20  19 - 32 mEq/L   Glucose, Bld 126 (*) 70 - 99 mg/dL   BUN 24 (*) 6 - 23 mg/dL   Creatinine, Ser 8.11  0.50 - 1.35 mg/dL   Calcium 8.2 (*) 8.4 - 10.5 mg/dL   Total Protein 5.8 (*) 6.0 - 8.3 g/dL   Albumin 2.4 (*) 3.5 - 5.2 g/dL   AST 12  0 - 37 U/L   ALT 7  0 - 53 U/L   Alkaline Phosphatase 115  39 - 117 U/L   Total Bilirubin 0.4  0.3 - 1.2 mg/dL   GFR calc non Af Amer 60 (*) >90 mL/min   GFR calc Af Amer 70 (*) >90 mL/min   Comment:            The eGFR has been calculated     using the CKD EPI equation.     This calculation has not been     validated in all clinical     situations.     eGFR's persistently     <90 mL/min signify     possible Chronic Kidney Disease.  MAGNESIUM     Status: Abnormal   Collection Time    09/02/12  3:20 PM      Result Value Range   Magnesium 1.2 (*) 1.5 - 2.5 mg/dL  PHOSPHORUS     Status: None   Collection Time    09/02/12  3:20 PM      Result Value Range   Phosphorus 2.6  2.3 - 4.6 mg/dL  CBC WITH DIFFERENTIAL      Status: Abnormal   Collection Time    09/02/12  3:20 PM      Result Value Range   WBC 31.9 (*) 4.0 - 10.5 K/uL   RBC 3.28 (*) 4.22 - 5.81 MIL/uL   Hemoglobin 8.8 (*) 13.0 - 17.0 g/dL   HCT 91.4 (*) 78.2 - 95.6 %   MCV 80.8  78.0 - 100.0 fL   MCH 26.8  26.0 - 34.0 pg   MCHC 33.2  30.0 - 36.0 g/dL   RDW 21.3 (*) 08.6 - 57.8 %   Platelets 283  150 - 400 K/uL   Neutrophils Relative 93 (*) 43 - 77 %   Neutro Abs 29.8 (*) 1.7 - 7.7 K/uL   Lymphocytes Relative 2 (*) 12 - 46 %   Lymphs Abs 0.8  0.7 - 4.0 K/uL   Monocytes Relative 4  3 - 12 %   Monocytes Absolute 1.3 (*) 0.1 - 1.0 K/uL   Eosinophils Relative 0  0 - 5 %   Eosinophils Absolute 0.0  0.0 - 0.7 K/uL   Basophils Relative 0  0 - 1 %   Basophils Absolute 0.0  0.0 - 0.1 K/uL   WBC Morphology INCREASED BANDS (>20% BANDS)    APTT     Status: Abnormal   Collection Time    09/02/12  3:20 PM      Result Value Range   aPTT 58 (*) 24 -  37 seconds   Comment:            IF BASELINE aPTT IS ELEVATED,     SUGGEST PATIENT RISK ASSESSMENT     BE USED TO DETERMINE APPROPRIATE     ANTICOAGULANT THERAPY.  PROTIME-INR     Status: Abnormal   Collection Time    09/02/12  3:20 PM      Result Value Range   Prothrombin Time 32.0 (*) 11.6 - 15.2 seconds   INR 3.34 (*) 0.00 - 1.49     Assessment/Plan 1 atrial fibrillation with a rapid ventricular response-some of his heart rate is being driven by his ongoing infection as well as dehydration. For now would recommend continuing IV Cardizem and metoprolol. Follow heart rate and adjust as needed. His heart rate should also improve with hydration and treatment of his colitis and cellulitis. Would continue Coumadin with goal INR 2-3. 2 coronary artery disease-patient had a bare-metal stent on February 3. Continue aspirin 81 mg daily but discontinue brilinta. 3 Clostridium difficile colitis-patient has been initiated on vancomycin and Flagyl. He may require infectious disease consult. 4  dehydration-agree with IV fluids. Follow volume status and adjust as needed. 5 cellulitis right lower extremity-the patient has a small area of cellulitis in the lower right lower extremity. He states this is where he hit his leg on his tractor. Continue antibiotics. Small fluctuant area. This may require debridement. 6 lung cancer-given his multiple medical problems he is now felt to be a poor candidate for surgical resection. He is presently undergoing radiation therapy. 7 diabetes mellitus-management per primary care. 8 COPD  Olga Millers MD 09/02/2012, 5:16 PM

## 2012-09-02 NOTE — Telephone Encounter (Signed)
Spoke with Diane in lab regarding elevated white count and states the machine didn't indicate anything unusual in blood but if Dr.Kinard wants a manual smear to evaluate further to put in order for morphology.Blood is held 24 hours before discarded.Dr. Roselind Messier will hold off on this for now.

## 2012-09-02 NOTE — Progress Notes (Signed)
Patient is currently on a cardizem gtt for about an hour. His heart rate has improved but remains in a.fib with a HR between 120s-130s. Cardizem gtt rate was adjusted from 5 mg/hr to 10mg /hr. Will continue to monitor.

## 2012-09-02 NOTE — Progress Notes (Signed)
Mr. Brandon Clay here to see Dr. Roselind Messier.  He states that he has been feeling very weak.  He needs assistance to get out of bed and to sit in a chair.  This morning he has been vomiting and is not able to keep anything down.  He had diarrhea once this morning.  He denies pain and a sore throat. He also has a red, swollen area on his right lower leg.  He bumped on his tractor two weeks ago.  His bp sitting was 111/54 sitting and 128/90 standing.  His heart rate was 136 and 116.

## 2012-09-02 NOTE — H&P (Signed)
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 difficile colitis requiring vancomycin by mouth, atrial fibrillation on Coumadin, CAD, 3 vessels w/ chronic occlusion of RCA, severe stenosis in proximal LCx & moderately severe disease in mid LAD, S/P PTCA,  on ASA and Brilinta (Plavix nonresponder), recent admission to [facility] in 07/2012 for abdominal pain, vomiting and diarrhea who was sent from radiation due to to generalized weakness, vomiting and diarrhea. Patient was receiving stereotactic body radiotherapy (#4 / 5 planned treatments) and was noticed to be very weak and dizzy upon standing up. Patient also reported having nausea, vomiting and diarrhea. Per patient he has not been feeling well for past one week prior to this admission. He does not exactly recall how many episodes of diarrhea he has had but reports a last one was this morning. Patient reports no abdominal pain, no blood in the stool or urine. No reports of fever, cough, shortness of breath or chest pain. No palpitations. No loss of consciousness. Please note that this is the direct admission. There was no ED evaluation. Patient was transferred to telemetry unit from radiation. Patient was noted to be in atrial fibrillation with heart rates in 150s. His blood pressure remains stable, 126/72. Oxygen saturation is 99% with 2 L nasal cannula. Blood work revealed leukocytosis of 32.7, hemoglobin of 8. His INR is 3.34 on this admission.  Assessment and plan:  Principal Problem:  Diarrhea, vomiting, abdominal pain  This may again be secondary to C. difficile colitis. Followup results of C. difficile PCR  Continue gentle hydration, IV  fluids 10-20 cc an hour considering extensive coronary artery disease and atrial fibrillation. Patient does oral tolerate oral intake. Replace electrolytes as needed. Continue antiemetics as needed. Active Problems:  Atrial fibrillation with RVR  Patient has history of atrial fibrillation, on Coumadin. He used to supratherapeutic INR Coumadin was on hold for about one week prior to this admission. Patient has no reports of chest pain. Started on Cardizem drip. Appreciate cardiology consult. Leukocytosis  Secondary to possible C. difficile colitis Start Flagyl and vancomycin by mouth until results available. Hypertension  Continue Cardizem drip. Hold metoprolol and Cardizem. Monitor blood pressure per unit protocol. Hypercholesterolemia  statin intolerant Diabetes, type II and with complications  Check hemoglobin A1c Hold metformin in the setting of possible C. difficile colitis Continue Levemir 18 units at bedtime Continue sliding scale insulin Squamous cell lung cancer left upper lobe  Undergoing stereotactic body radiation, 4/5 planned treatments under Dr. Trina Ao care COPD (chronic obstructive pulmonary disease)  Stable. Continue Symbicort CAD (coronary artery disease), native coronary artery  On Brilinta and aspirin. Appreciate cardiology following Anemia of chronic disease  Secondary to history of squamous lung cancer. No signs of active bleed. Benign prostatic hypertrophy  continue Finasteride and Flomax    Code Status: Full  Family Communication: Pt at bedside  Disposition Plan: Admit for further evaluation, telemetry bed     [name], MD  [facility] Pager [phone]  If 7PM-7AM, please contact night-coverage [email or web address] Password [email or web address] 09/02/2012, 4:18 PM   Review of Systems:  Constitutional: Negative for fever, chills and positive for malaise/fatigue. Negative for diaphoresis.  HENT: Negative for hearing loss, ear pain, nosebleeds, congestion, sore throat,  neck  pain, tinnitus and ear discharge.   Eyes: Negative for blurred vision, double vision, photophobia, pain, discharge and redness.  Respiratory: Negative for cough, hemoptysis, sputum production, shortness of breath, wheezing and stridor.   Cardiovascular: Negative for chest pain, palpitations, orthopnea, claudication and leg swelling.  Gastrointestinal: Negative for nausea, vomiting and abdominal pain. Negative for heartburn, constipation, blood in stool and melena.  Genitourinary: Negative for dysuria, urgency, frequency, hematuria and flank pain.  Musculoskeletal: positive for myalgias, back pain, joint pain and negative for falls.  Skin: Negative for itching and rash.  Neurological: Negative for dizziness and positive for weakness. Negative for tingling, tremors, sensory change, loss of consciousness and headaches.  Endo/Heme/Allergies: Negative for environmental allergies and polydipsia. Does not bruise/bleed easily.  Psychiatric/Behavioral: Negative for suicidal ideas. The patient is not nervous/anxious.      Past Medical History  Diagnosis Date  . Hypertension   . Hypercholesterolemia     statin intolerant  . Diabetes mellitus, type 2   . Benign prostatic hypertrophy   . Emphysema   . Lung cancer     Followed by Dr. [name] (LUL squamous cell carcinoma)  . COPD (chronic obstructive pulmonary disease)   . CKD (chronic kidney disease)   . Colitis   . Coronary artery disease     Cath 07/02/12 3v CAD w/ chronic occlusion of RCA (fills from left collaterals), severe stenosis prox LCx & moderately severe dz in mid LAD; s/p PTCA/BMS to prox LCx 07/12/12   . Normocytic anemia   . Persistent atrial fibrillation   . Atrial enlargement, left    Past Surgical History  Procedure Laterality Date  . Hemorrhoid surgery    . Tee without cardioversion N/A 07/29/2012    Procedure: TRANSESOPHAGEAL ECHOCARDIOGRAM (TEE);  Surgeon: Laurey Morale, MD;  Location: [facility] ENDOSCOPY;  Service:  Cardiovascular;  Laterality: N/A;  patient coming from [place] room 1236 talk to jennifer at [place]  talk to [facility] from care link will pick up pat. at 8:15 from [place]  . Cardioversion N/A 07/29/2012    Procedure: CARDIOVERSION;  Surgeon: Laurey Morale, MD;  Location: [facility] ENDOSCOPY;  Service: Cardiovascular;  Laterality: N/A;  . Cardiac catheterization    . Coronary angioplasty with stent placement    . Lung biopsy     Social History:  reports that he quit smoking about 5 years ago. His smoking use included Cigarettes. He started smoking about 54 years ago. He has a 25 pack-year smoking history. He has never used smokeless tobacco. He reports that he does not drink alcohol or use illicit drugs.  No Known Allergies  Family History:  Family History  Problem Relation Age of Onset  . Stroke Mother   . Heart disease Father     died of heart attack  . Lung cancer Brother   . Hypertension Brother   . Cancer Brother     brain cancer  . Hypothyroidism Brother   . Hyperlipidemia Sister   . Hyperlipidemia Sister      Prior to Admission medications   Medication Sig Start Date End Date Taking? Authorizing Provider  aspirin EC 81 MG tablet Take 81 mg by mouth every other day.   Yes Historical Provider, MD  budesonide-formoterol (SYMBICORT) 160-4.5 MCG/ACT inhaler Inhale 2 puffs into the lungs 2 (two) times daily.   Yes Historical Provider, MD  diltiazem (CARDIZEM CD) 180 MG 24 hr capsule Take 180 mg by mouth daily. 08/03/12  Yes Kathlen Mody, MD  finasteride (PROSCAR) 5 MG  tablet Take 5 mg by mouth every morning.  05/31/12  Yes Historical Provider, MD  insulin detemir (LEVEMIR) 100 UNIT/ML injection Inject 18 Units into the skin at bedtime. 08/03/12  Yes Kathlen Mody, MD  loperamide (IMODIUM A-D) 2 MG tablet Take 2 mg by mouth 4 (four) times daily as needed for diarrhea or loose stools.   Yes Historical Provider, MD  metFORMIN (GLUCOPHAGE-XR) 500 MG 24 hr tablet Take 1,000 mg by mouth 2  (two) times daily. 07/13/12  Yes Jessica A Hope, PA-C  Tamsulosin HCl (FLOMAX) 0.4 MG CAPS Take 0.4 mg by mouth every morning.  05/31/12  Yes Historical Provider, MD  Ticagrelor (BRILINTA) 90 MG TABS tablet Take 1 tablet (90 mg total) by mouth 2 (two) times daily. 07/13/12  Yes Jessica A Hope, PA-C  metoprolol (LOPRESSOR) 100 MG tablet Take 0.5 tablets (50 mg total) by mouth 2 (two) times daily. 08/11/12   Beatrice Lecher, PA-C  warfarin (COUMADIN) 3 MG tablet Take 4 mg by mouth daily at 6 PM. 08/03/12   Kathlen Mody, MD   Physical Exam: Filed Vitals:   09/02/12 1400  BP: 107/69  Pulse: 151  Temp: 98.2 F (36.8 C)  TempSrc: Oral  Resp: 18  Height: 5\' 10"  (1.778 m)  Weight: 67.858 kg (149 lb 9.6 oz)  SpO2: 95%    Physical Exam  Constitutional: Appears ill. No distress.  HENT: Normocephalic. External right and left ear normal. Dry mucus membranes. Eyes: Conjunctivae and EOM are normal. PERRLA, no scleral icterus.  Neck: Normal ROM. Neck supple. No JVD. No tracheal deviation. No thyromegaly.  CVS: tachycardic, irregular rhythm, (+) S1 and S2.  Pulmonary: Effort and breath sounds normal, no stridor, rhonchi, wheezes, rales.  Abdominal: Soft. BS +,  no distension, tenderness, rebound or guarding.  Musculoskeletal: Normal range of motion. No edema and no tenderness.  Lymphadenopathy: No lymphadenopathy noted, cervical, inguinal. Neuro: Alert. Normal reflexes, muscle tone coordination. No cranial nerve deficit. Skin: Skin is warm and dry. Small area of cellulitis over right lower anterior shin, warm and tender to touch  Psychiatric: Normal mood and affect. Behavior, judgment, thought content normal.   Labs on Admission:  Basic Metabolic Panel:  Recent Labs Lab 08/31/12 1305 09/01/12 1453 09/02/12 1052 09/02/12 1515 09/02/12 1520  NA 137 134* 136  --  134*  K 3.9 3.9 3.6  --  3.6  CL 105 102 105  --  98  CO2 21* 21 19*  --  20  GLUCOSE 103* 161* 114*  --  126*  BUN 22.5 24* 24.2  --   24*  CREATININE 1.1 1.3 1.2  --  1.15  CALCIUM 8.2* 8.1* 7.8*  --  8.2*  MG  --  1.2*  --   --  1.2*  PHOS  --   --   --  2.7 2.6   Liver Function Tests:  Recent Labs Lab 09/02/12 1052 09/02/12 1520  AST 8 12  ALT 6 7  ALKPHOS 102 115  BILITOT 0.54 0.4  PROT 5.4* 5.8*  ALBUMIN 2.1* 2.4*   No results found for this basename: LIPASE, AMYLASE,  in the last 168 hours No results found for this basename: AMMONIA,  in the last 168 hours CBC:  Recent Labs Lab 08/31/12 1305 09/01/12 1453 09/02/12 1052 09/02/12 1520  WBC 21.4* 22.9 Repeated and verified X2.* 32.7* 31.9*  NEUTROABS 19.0*  --  30.7* 29.8*  HGB 8.9* 9.6* 8.0* 8.8*  HCT 27.6* 29.2* 25.2* 26.5*  MCV  80.9 81.3 80.0 80.8  PLT 252 307.0 242 283   Cardiac Enzymes: No results found for this basename: CKTOTAL, CKMB, CKMBINDEX, TROPONINI,  in the last 168 hours BNP: No components found with this basename: POCBNP,  CBG: No results found for this basename: GLUCAP,  in the last 168 hours  Radiological Exams on Admission: No results found.  EKG: Atrial fibrillation  Time spent: 110 minutes

## 2012-09-02 NOTE — Progress Notes (Signed)
  Radiation Oncology         ([phone]) [phone] ________________________________  Name: Brandon Clay MRN: [id number]  Date: 09/02/2012  DOB: [birth year]  Stereotactic Body Radiotherapy Treatment Procedure Note  NARRATIVE:  Brandon Clay was brought to the stereotactic radiation treatment machine and placed supine on the CT couch. The patient was set up for stereotactic body radiotherapy on the body fix pillow.  3D TREATMENT PLANNING AND DOSIMETRY:  The patient's radiation plan was reviewed and approved prior to starting treatment.  It showed 3-dimensional radiation distributions overlaid onto the planning CT.  The [facility] for the target structures as well as the organs at risk were reviewed. The documentation of this is filed in the radiation oncology EMR.  SIMULATION VERIFICATION:  The patient underwent CT imaging on the treatment unit.  These were carefully aligned to document that the ablative radiation dose would cover the target volume and maximally spare the nearby organs at risk according to the planned distribution.  SPECIAL TREATMENT PROCEDURE: Brandon Clay received high dose ablative stereotactic body radiotherapy to the planned target volume without unforeseen complications. Treatment was delivered uneventfully. The high doses associated with stereotactic body radiotherapy and the significant potential risks require careful treatment set up and patient monitoring constituting a special treatment procedure   STEREOTACTIC TREATMENT MANAGEMENT:  Following delivery, the patient was evaluated clinically. The patient tolerated treatment without significant acute effects, and was discharged to home in stable condition.    PLAN: Continue treatment as planned. Four of five treatments complete.  Next is scheduled for 09/06/2012.  ________________________________  [name], PhD, MD

## 2012-09-03 ENCOUNTER — Inpatient Hospital Stay (HOSPITAL_COMMUNITY): Payer: Medicare Other

## 2012-09-03 DIAGNOSIS — A0472 Enterocolitis due to Clostridium difficile, not specified as recurrent: Secondary | ICD-10-CM

## 2012-09-03 DIAGNOSIS — C3490 Malignant neoplasm of unspecified part of unspecified bronchus or lung: Secondary | ICD-10-CM

## 2012-09-03 DIAGNOSIS — J449 Chronic obstructive pulmonary disease, unspecified: Secondary | ICD-10-CM

## 2012-09-03 DIAGNOSIS — E876 Hypokalemia: Secondary | ICD-10-CM | POA: Diagnosis present

## 2012-09-03 DIAGNOSIS — I119 Hypertensive heart disease without heart failure: Secondary | ICD-10-CM

## 2012-09-03 LAB — COMPREHENSIVE METABOLIC PANEL
AST: 12 U/L (ref 0–37)
Albumin: 2 g/dL — ABNORMAL LOW (ref 3.5–5.2)
Calcium: 7.6 mg/dL — ABNORMAL LOW (ref 8.4–10.5)
Chloride: 103 mEq/L (ref 96–112)
Creatinine, Ser: 1.1 mg/dL (ref 0.50–1.35)
Total Bilirubin: 0.3 mg/dL (ref 0.3–1.2)

## 2012-09-03 LAB — TSH: TSH: 0.845 u[IU]/mL (ref 0.350–4.500)

## 2012-09-03 LAB — GLUCOSE, CAPILLARY
Glucose-Capillary: 47 mg/dL — ABNORMAL LOW (ref 70–99)
Glucose-Capillary: 151 mg/dL — ABNORMAL HIGH (ref 70–99)
Glucose-Capillary: 140 mg/dL — ABNORMAL HIGH (ref 70–99)
Glucose-Capillary: 113 mg/dL — ABNORMAL HIGH (ref 70–99)

## 2012-09-03 LAB — MRSA PCR SCREENING: MRSA by PCR: NEGATIVE

## 2012-09-03 LAB — CBC
HCT: 24.6 % — ABNORMAL LOW (ref 39.0–52.0)
Hemoglobin: 8.3 g/dL — ABNORMAL LOW (ref 13.0–17.0)
MCH: 26.7 pg (ref 26.0–34.0)
MCV: 79.1 fL (ref 78.0–100.0)
RBC: 3.11 MIL/uL — ABNORMAL LOW (ref 4.22–5.81)

## 2012-09-03 LAB — CORTISOL: Cortisol, Plasma: 50.7 ug/dL

## 2012-09-03 MED ORDER — SODIUM CHLORIDE 0.9 % IV BOLUS (SEPSIS)
500.0000 mL | Freq: Once | INTRAVENOUS | Status: AC
  Administered 2012-09-03: 500 mL via INTRAVENOUS

## 2012-09-03 MED ORDER — POTASSIUM CHLORIDE CRYS ER 20 MEQ PO TBCR
40.0000 meq | EXTENDED_RELEASE_TABLET | Freq: Once | ORAL | Status: AC
  Administered 2012-09-03: 40 meq via ORAL
  Filled 2012-09-03: qty 2

## 2012-09-03 MED ORDER — JUVEN PO PACK
1.0000 | PACK | Freq: Two times a day (BID) | ORAL | Status: DC
  Administered 2012-09-03 – 2012-09-09 (×11): 1 via ORAL
  Filled 2012-09-03 (×15): qty 1

## 2012-09-03 MED ORDER — POTASSIUM CHLORIDE 10 MEQ/100ML IV SOLN
10.0000 meq | INTRAVENOUS | Status: AC
  Administered 2012-09-03 (×5): 10 meq via INTRAVENOUS
  Filled 2012-09-03: qty 500

## 2012-09-03 MED ORDER — POTASSIUM CHLORIDE 10 MEQ/100ML IV SOLN
10.0000 meq | INTRAVENOUS | Status: DC
  Filled 2012-09-03: qty 200

## 2012-09-03 MED ORDER — GLUCERNA SHAKE PO LIQD
237.0000 mL | Freq: Two times a day (BID) | ORAL | Status: DC
  Administered 2012-09-03: 237 mL via ORAL
  Administered 2012-09-04: 240 mL via ORAL
  Administered 2012-09-05 – 2012-09-16 (×9): 237 mL via ORAL
  Filled 2012-09-03 (×29): qty 237

## 2012-09-03 MED ORDER — INSULIN DETEMIR 100 UNIT/ML ~~LOC~~ SOLN
10.0000 [IU] | Freq: Every day | SUBCUTANEOUS | Status: DC
  Administered 2012-09-03: 10 [IU] via SUBCUTANEOUS
  Filled 2012-09-03: qty 0.1

## 2012-09-03 MED ORDER — ADULT MULTIVITAMIN W/MINERALS CH
1.0000 | ORAL_TABLET | Freq: Every day | ORAL | Status: DC
  Administered 2012-09-04 – 2012-09-17 (×14): 1 via ORAL
  Filled 2012-09-03 (×15): qty 1

## 2012-09-03 MED ORDER — DEXTROSE 50 % IV SOLN: 25.0000 mL | Freq: Once | INTRAVENOUS | Status: AC | PRN

## 2012-09-03 MED ORDER — DEXTROSE 50 % IV SOLN
INTRAVENOUS | Status: AC
  Administered 2012-09-03: 50 mL via INTRAVENOUS
  Filled 2012-09-03: qty 50

## 2012-09-03 NOTE — Progress Notes (Signed)
Hypoglycemic Event  CBG: 45 Treatment: 120 ml Orange juice  Symptoms: lethargy, confusion  Follow-up CBG: Time:0745 CBG Result:47  Possible Reasons for Event:pt not eating  Comments/MD notified:Dr. Rama; ordered 1 amp D50; administered; CBG 151    Price, Josie [place]  Remember to initiate Hypoglycemia Order Set & complete

## 2012-09-03 NOTE — Progress Notes (Signed)
Pt unable to void in urinal or BSC. Bladder scan revealed 860+. Dr. Darnelle Catalan paged. Foley order received.

## 2012-09-03 NOTE — Progress Notes (Signed)
[facility] PROGRESS NOTE  [name] ZOX:[id number] DOB: Aug 02, 1934 DOA: 09/02/2012 PCP: [name], MD  Brief narrative: Brandon Clay is an 78 y.o. 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 difficile colitis requiring PO vancomycin, atrial fibrillation on chronic Coumadin, 3 vessel CAD with chronic occlusion of RCA, severe stenosis in proximal LCx & moderately severe disease in mid LAD, S/P PTCA, on ASA and Brilinta (Plavix nonresponder), recent admission 07/24/12-08/02/12 for treatment of C. Diff who was sent to the hospital from radiation for evaluation of generalized weakness, vomiting and diarrhea. Patient was receiving stereotactic body radiotherapy (#4 / 5 planned treatments) and was noticed to be very weak and dizzy upon standing up. Patient also reported having nausea, vomiting and diarrhea. On admission, he was was noted to be in atrial fibrillation with heart rates in 150s. He also had significant leukocytosis of 32.7. The source of his leukocytosis is not entirely clear but may be secondary to recurrent Clostridium difficile versus right lower extremity cellulitis.  Assessment/Plan: Principal Problem:  Generalized weakness in the setting of nausea, vomiting, diarrhea, dehydration / rule out recurrent Clostridium difficile colitis -Worrisome for recurrent C. difficile colitis. No significant diarrhea since admission.   Followup results of C. difficile PCR  -Continue gentle hydration, IV fluids 10-20 cc an hour considering extensive coronary artery disease and atrial fibrillation. -Replace electrolytes as needed.  -Continue antiemetics as needed. -Physical and occupational therapy evaluations requested. Active Problems: Right lower extremity cellulitis -Small fluctuant area noted RLE which may require debridement.  -Will treat with IV vancomycin and if he does not respond, obtain an orthopedics evaluation for incision and  drainage. Atrial fibrillation with RVR  -Known history of atrial fibrillation, on Coumadin. Goal INR 2-3. -Initially treated with Cardizem drip and metoprolol. Cardizem drip discontinued secondary to low blood pressure and given digoxin overnight on 09/02/2012.  Now on Amiodarone drip. -Seen by cardiologist on 09/02/2012. Trigger felt to be from infection and dehydration. Leukocytosis  -Most likely from infection (recurrent C. difficile colitis versus cellulitis).  -Continue Flagyl and vancomycin by mouth. Add IV vancomycin to cover right lower extremity cellulitis. Hypertension  -Blood pressure currently well-controlled (and on low side). Hypercholesterolemia  -History of statin intolerance. 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 CBGs 122-137. -Metformin currently on hold.  Decrease Levemir to 10 units at bedtime and continue sliding scale insulin before meals, but discontinue at bedtime coverage. 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 Trina Ao care, felt to be too weak to proceed with lung resection. COPD (chronic obstructive pulmonary disease)  -Stable. Continue Symbicort. CAD (coronary artery disease), native coronary artery  -On outpatient Brilinta and aspirin.  -Now off Brilinta per cardiology recommendations. Anemia of chronic disease  -Secondary to history multiple medical problems including squamous cell lung cancer.  -No signs of active bleed. Hemoglobin stable with no indication for transfusion. Benign prostatic hypertrophy  -Continue Finasteride and Flomax.  Code Status: Full  Family Communication: Wife updated at bedside.  Disposition Plan: Home when stable.   Medical Consultants:  Dr. Olga Millers, Cardiology.  Other Consultants:  None.  Anti-infectives:  Vancomycin 09/02/2012--->  Flagyl 09/02/2012---->  IV Vancomycin 09/03/12--->  HPI/Subjective: Brandon Noa  Clay denies chest pain or dyspnea. He has not had any diarrhea since admission. No vomiting overnight. He does have a little bit of pain in the right lower leg at the area  of cellulitis.  Objective: Filed Vitals:   09/03/12 0300 09/03/12 0400 09/03/12 0500 09/03/12 0600  BP: 107/52 113/49 100/39 120/58  Pulse: 120 124 121 128  Temp:  98.7 F (37.1 C)    TempSrc:  Oral    Resp: 24 25 24 26   Height:      Weight:   75.5 kg (166 lb 7.2 oz)   SpO2: 91% 93% 94% 94%    Intake/Output Summary (Last 24 hours) at 09/03/12 1610 Last data filed at 09/03/12 0600  Gross per 24 hour  Intake 1717.39 ml  Output      0 ml  Net 1717.39 ml    Exam: Gen:  NAD, non-toxic appearing Cardiovascular:  HSIR, tachy Respiratory:  Lungs CTAB Gastrointestinal:  Abdomen soft, NT/ND, + BS Extremities:  1+ edema, focal area 3 cm swelling, induration, redness RLE superior to ankle.  Data Reviewed: Basic Metabolic Panel:  Recent Labs Lab 08/31/12 1305  09/01/12 1453 09/02/12 1052 09/02/12 1515 09/02/12 1520 09/03/12 0341  NA 137  --  134* 136  --  134* 135  K 3.9  < > 3.9 3.6  --  3.6 3.0*  CL 105  --  102 105  --  98 103  CO2 21*  --  21 19*  --  20 19  GLUCOSE 103*  --  161* 114*  --  126* 47*  BUN 22.5  --  24* 24.2  --  24* 24*  CREATININE 1.1  --  1.3 1.2  --  1.15 1.10  CALCIUM 8.2*  --  8.1* 7.8*  --  8.2* 7.6*  MG  --   --  1.2*  --   --  1.2*  --   PHOS  --   --   --   --  2.7 2.6  --   < > = values in this interval not displayed. GFR Estimated Creatinine Clearance: 59.9 ml/min (by C-G formula based on Cr of 1.1). Liver Function Tests:  Recent Labs Lab 09/02/12 1052 09/02/12 1520 09/03/12 0341  AST 8 12 12   ALT 6 7 7   ALKPHOS 102 115 102  BILITOT 0.54 0.4 0.3  PROT 5.4* 5.8* 5.1*  ALBUMIN 2.1* 2.4* 2.0*   Coagulation profile  Recent Labs Lab 09/02/12 1520 09/03/12 0341  INR 3.34* 4.35*    CBC:  Recent Labs Lab 08/31/12 1305 09/01/12 1453 09/02/12 1052  09/02/12 1520 09/03/12 0341  WBC 21.4* 22.9 Repeated and verified X2.* 32.7* 31.9* 29.4*  NEUTROABS 19.0*  --  30.7* 29.8*  --   HGB 8.9* 9.6* 8.0* 8.8* 8.3*  HCT 27.6* 29.2* 25.2* 26.5* 24.6*  MCV 80.9 81.3 80.0 80.8 79.1  PLT 252 307.0 242 283 263   BNP (last 3 results)  Recent Labs  07/24/12 2140 08/11/12 1346  PROBNP 15551.0* 463.0*   CBG:  Recent Labs Lab 09/02/12 1731 09/02/12 2100  GLUCAP 137* 122*   Hgb A1c  Recent Labs  09/02/12 1520  HGBA1C 6.7*   Microbiology Recent Results (from the past 240 hour(s))  MRSA PCR SCREENING     Status: None   Collection Time    09/02/12 10:21 PM      Result Value Range Status   MRSA by PCR NEGATIVE  NEGATIVE Final   Comment:            The GeneXpert MRSA Assay (FDA     approved for NASAL specimens     only), is one component of a  comprehensive MRSA colonization     surveillance program. It is not     intended to diagnose MRSA     infection nor to guide or     monitor treatment for     MRSA infections.     Procedures and Diagnostic Studies: No results found.  Scheduled Meds: . aspirin EC  81 mg Oral QODAY  . budesonide-formoterol  2 puff Inhalation BID  . finasteride  5 mg Oral q morning - 10a  . insulin aspart  0-15 Units Subcutaneous TID WC  . insulin aspart  0-5 Units Subcutaneous QHS  . insulin detemir  18 Units Subcutaneous QHS  . metoprolol  50 mg Oral BID  . metronidazole  500 mg Intravenous Q8H  . sodium chloride  3 mL Intravenous Q12H  . tamsulosin  0.4 mg Oral q morning - 10a  . vancomycin  1,250 mg Intravenous Q24H  . vancomycin  125 mg Oral Q6H   Continuous Infusions: . sodium chloride 75 mL/hr at 09/02/12 2006  . amiodarone (NEXTERONE PREMIX) 360 mg/200 mL dextrose 60 mg/hr (09/03/12 0529)    Time spent: 45 minutes.   LOS: 1 day   RAMA,CHRISTINA  [facility] Pager [phone].  If 8PM-8AM, please contact night-coverage at [email or web address], password [facility] 09/03/2012, 7:07 AM

## 2012-09-03 NOTE — Progress Notes (Signed)
TELEMETRY: Reviewed telemetry pt in atrial fibrillation with rate 115 bpm.: Filed Vitals:   09/03/12 0700 09/03/12 0730 09/03/12 0800 09/03/12 0835  BP: 118/52 104/52    Pulse: 125 117    Temp:   98.5 F (36.9 C)   TempSrc:   Axillary   Resp: 24 21    Height:      Weight:      SpO2: 96% 93%  99%    Intake/Output Summary (Last 24 hours) at 09/03/12 1040 Last data filed at 09/03/12 0824  Gross per 24 hour  Intake 2116.49 ml  Output      0 ml  Net 2116.49 ml    SUBJECTIVE Patient resting comfortably. Denies SOB or Chest pain. Transferred to the unit last night. Became hypotensive on IV cardizem. cardizem drip stopped. Given IV dig x 2. Started on IV amiodarone.  LABS: Basic Metabolic Panel:  Recent Labs  78/29/56 1453  09/02/12 1515 09/02/12 1520 09/03/12 0341  NA 134*  < >  --  134* 135  K 3.9  < >  --  3.6 3.0*  CL 102  < >  --  98 103  CO2 21  < >  --  20 19  GLUCOSE 161*  < >  --  126* 47*  BUN 24*  < >  --  24* 24*  CREATININE 1.3  < >  --  1.15 1.10  CALCIUM 8.1*  < >  --  8.2* 7.6*  MG 1.2*  --   --  1.2*  --   PHOS  --   --  2.7 2.6  --   < > = values in this interval not displayed. Liver Function Tests:  Recent Labs  09/02/12 1520 09/03/12 0341  AST 12 12  ALT 7 7  ALKPHOS 115 102  BILITOT 0.4 0.3  PROT 5.8* 5.1*  ALBUMIN 2.4* 2.0*   No results found for this basename: LIPASE, AMYLASE,  in the last 72 hours CBC:  Recent Labs  09/02/12 1052 09/02/12 1520 09/03/12 0341  WBC 32.7* 31.9* 29.4*  NEUTROABS 30.7* 29.8*  --   HGB 8.0* 8.8* 8.3*  HCT 25.2* 26.5* 24.6*  MCV 80.0 80.8 79.1  PLT 242 283 263   Hemoglobin A1C:  Recent Labs  09/02/12 1520  HGBA1C 6.7*   Fasting Lipid Panel: No results found for this basename: CHOL, HDL, LDLCALC, TRIG, CHOLHDL, LDLDIRECT,  in the last 72 hours Thyroid Function Tests:  Recent Labs  09/03/12 0341  TSH 0.845   Radiology/Studies:  Dg Chest Port 1 View  09/03/2012  *RADIOLOGY REPORT*   Clinical Data: Pulmonary edema.  History of lung cancer.  PORTABLE CHEST - 1 VIEW  Comparison: PA and lateral chest 07/24/2012.  Findings: Left upper lobe mass is again seen measuring approximately 4.4 cm cranial-caudal, not markedly changed.  The lungs are emphysematous.  Bilateral alveolar and interstitial opacities have worsened.  There is cardiomegaly.  No pneumothorax or pleural fluid.  IMPRESSION:  1.  Bilateral alveolar and interstitial opacities superimposed on chronic change are likely due to pulmonary edema in this patient with cardiomegaly. Multi focal infection or interstitial tumor spread are felt less likely. 2.  Left upper lobe mass.   Original Report Authenticated By: Holley Dexter, M.D.     PHYSICAL EXAM General: Elderly, chronically ill appearing, in no acute distress. Head: Normal Neck: Negative for carotid bruits. JVD not elevated. Lungs: Clear bilaterally. Heart: IRRR S1 S2 without murmurs, rubs, or gallops.  Abdomen: Soft, non-tender, non-distended with normoactive bowel sounds. No hepatomegaly. No rebound/guarding. No obvious abdominal masses. Extremities: traceedema.  Distal pedal pulses are 2+ and equal bilaterally. Neuro: nonfocal  ASSESSMENT AND PLAN: 1. Atrial fibrillation with RVR. Hypotension on IV cardizem. Agree with IV amiodarone and dig. Continue po metoprolol. Rate should improve with more amiodarone and correction of dehydration/ infections. On coumadin with target INR 2-3. TSH is normal. 2. CAD s/p BMS 07/12/12. Continue ASA 81 mg daily. 3. C. Diff colitis. 4. RLE cellulitis. 5. Lung CA. Poor candidate for resection. 6. DM 7. COPD.   Principal Problem:   Generalized weakness with diarrhea, vomiting, and abdominal pain in the setting of a recent history of Clostridium difficile colitis Active Problems:   Hypertensive heart disease   Type 2 diabetes mellitus with vascular disease   Benign prostatic hypertrophy   Squamous cell lung cancer left upper  lobe   COPD (chronic obstructive pulmonary disease)   CAD (coronary artery disease), native coronary artery   Anemia of chronic disease   Leukocytosis   Rapid atrial fibrillation   Cellulitis of right lower extremity   Hypokalemia    Signed, Peter [place] MD,FACC 09/03/2012 10:46 AM

## 2012-09-03 NOTE — Progress Notes (Signed)
INITIAL NUTRITION ASSESSMENT  DOCUMENTATION CODES Per approved criteria  -Not Applicable   INTERVENTION: Provide Glucerna BID until appetite returns to normal Provide Juven BID to promote wound healing Provide Multivitamin with minerals daily Encourage PO intake >75% of meals  NUTRITION DIAGNOSIS: Inadequate oral intake related to decreased appetite as evidenced by pt report of only eating a few bites of food today.   Goal: Pt to meet >/= 90% of their estimated nutrition needs  Monitor:  PO intake Wt Wounds Labs  Reason for Assessment: MST  78 y.o. 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 difficile colitis requiring vancomycin by mouth, atrial fibrillation on Coumadin, CAD, recent admission to [facility] in 07/2012 for abdominal pain, vomiting and diarrhea who was sent from radiation due to to generalized weakness, vomiting and diarrhea. Patient was receiving stereotactic body radiotherapy (#4 / 5 planned treatments) and was noticed to be very weak and dizzy upon standing up. Patient also reported having nausea, vomiting and diarrhea. Per patient he has not been feeling well for past one week prior to this admission. He does not exactly recall how many episodes of diarrhea he has had but reports a last one was this morning. Pt also has history of COPD and type 2 diabetes. Pt reports that his usual body weight is 160 lbs and he has had a normal appetite until today. PTA pt was eating 2 meals and multiple snacks daily; pt reports following a diabetic diet at home and eating protein-rich foods daily. Pt has only had a few bites of food today due to poor appetite. Pt denies nausea, vomiting, and diarrhea at this time. Pt made aware that snacks are available upon request.  Height: Ht Readings from Last 1 Encounters:  09/02/12 5\' 10"  (1.778 m)     Weight: Wt Readings from Last 1 Encounters:  09/03/12 166 lb 7.2 oz (75.5 kg)    Ideal Body Weight: 166 lbs  % Ideal Body Weight: 100%  Wt Readings from Last 10 Encounters:  09/03/12 166 lb 7.2 oz (75.5 kg)  09/02/12 161 lb (73.029 kg)  09/01/12 169 lb 12.8 oz (77.021 kg)  08/31/12 168 lb (76.204 kg)  08/31/12 167 lb 11.2 oz (76.068 kg)  08/27/12 168 lb 4.8 oz (76.34 kg)  08/25/12 165 lb 4.8 oz (74.98 kg)  08/19/12 169 lb 12.8 oz (77.021 kg)  08/11/12 174 lb (78.926 kg)  08/11/12 174 lb (78.926 kg)    Usual Body Weight: 160 lbs per pt  % Usual Body Weight: 104%  BMI:  Body mass index is 23.88 kg/(m^2).  Estimated Nutritional Needs: Kcal: 2070-2370 Protein: 113-128 grams Fluid: 2.0- 2.3 L  Skin: +2 RLE and LLE edema; stage 2 ulcer on sacrum  Diet Order: Carb Control  EDUCATION NEEDS: -No education needs identified at this time   Intake/Output Summary (Last 24 hours) at 09/03/12 1119 Last data filed at 09/03/12 0824  Gross per 24 hour  Intake 2116.49 ml  Output      0 ml  Net 2116.49 ml    Last BM: 3/27  Labs:   Recent Labs Lab 09/01/12 1453 09/02/12 1052 09/02/12 1515 09/02/12 1520 09/03/12 0341  NA 134* 136  --  134* 135  K 3.9 3.6  --  3.6 3.0*  CL 102 105  --  98 103  CO2 21 19*  --  20 19  BUN 24* 24.2  --  24* 24*  CREATININE 1.3 1.2  --  1.15 1.10  CALCIUM 8.1* 7.8*  --  8.2* 7.6*  MG 1.2*  --   --  1.2*  --   PHOS  --   --  2.7 2.6  --   GLUCOSE 161* 114*  --  126* 47*    CBG (last 3)   Recent Labs  09/02/12 1731 09/02/12 2100 09/03/12 0739  GLUCAP 137* 122* 45*    Scheduled Meds: . aspirin EC  81 mg Oral QODAY  . budesonide-formoterol  2 puff Inhalation BID  . finasteride  5 mg Oral q morning - 10a  . insulin aspart  0-15 Units Subcutaneous TID WC  . insulin detemir  10 Units Subcutaneous QHS  . metoprolol  50 mg Oral BID  . metronidazole  500 mg Intravenous Q8H  . potassium chloride  10 mEq Intravenous Q1 Hr x 5   . sodium chloride  3 mL Intravenous Q12H  . tamsulosin  0.4 mg Oral q morning - 10a  . vancomycin  1,250 mg Intravenous Q24H  . vancomycin  125 mg Oral Q6H    Continuous Infusions: . sodium chloride 75 mL/hr at 09/02/12 2006  . amiodarone (NEXTERONE PREMIX) 360 mg/200 mL dextrose 29.646 mg/hr (09/03/12 0827)    Past Medical History  Diagnosis Date  . Hypertension   . Hypercholesterolemia     statin intolerant  . Diabetes mellitus, type 2   . Benign prostatic hypertrophy   . Emphysema   . Lung cancer     Followed by Dr. [name] (LUL squamous cell carcinoma)  . COPD (chronic obstructive pulmonary disease)   . CKD (chronic kidney disease)   . Colitis   . Coronary artery disease     Cath 07/02/12 3v CAD w/ chronic occlusion of RCA (fills from left collaterals), severe stenosis prox LCx & moderately severe dz in mid LAD; s/p PTCA/BMS to prox LCx 07/12/12   . Normocytic anemia   . Persistent atrial fibrillation   . Atrial enlargement, left     Past Surgical History  Procedure Laterality Date  . Hemorrhoid surgery    . Tee without cardioversion N/A 07/29/2012    Procedure: TRANSESOPHAGEAL ECHOCARDIOGRAM (TEE);  Surgeon: Laurey Morale, MD;  Location: [facility] ENDOSCOPY;  Service: Cardiovascular;  Laterality: N/A;  patient coming from [place] room 1236 talk to jennifer at [place]  talk to [facility] from care link will pick up pat. at 8:15 from [place]  . Cardioversion N/A 07/29/2012    Procedure: CARDIOVERSION;  Surgeon: Laurey Morale, MD;  Location: [facility] ENDOSCOPY;  Service: Cardiovascular;  Laterality: N/A;  . Cardiac catheterization    . Coronary angioplasty with stent placement    . Lung biopsy      Ian Malkin RD, LDN Inpatient Clinical Dietitian Pager: [phone] After Hours Pager: [phone]

## 2012-09-03 NOTE — Progress Notes (Signed)
eLink Physician-Brief Progress Note Patient Name: Brandon Clay DOB: 1935/01/29 MRN: [id number]  Date of Service  09/03/2012   HPI/Events of Note   Fib rvr, soft BP, now on amio  eICU Interventions  On limited O2 and no distress, ef noted, bolus, assess pcxr, cortisol, tsh   Intervention Category Intermediate Interventions: Hypotension - evaluation and management  FEINSTEIN,DANIEL J. 09/03/2012, 12:12 AM

## 2012-09-03 NOTE — Telephone Encounter (Signed)
Please tell them that this was the right outcome; to get admitted and sorted out

## 2012-09-03 NOTE — Progress Notes (Signed)
PT Cancellation Note  Patient Details Name: Brandon Clay MRN: [id number] DOB: 1935/05/11   Cancelled Treatment:     Order received. Chart reviewed. Noted pt with low BP at this time. Will hold PT today and check back another day. Thanks. Rebeca Alert, PT  [phone]

## 2012-09-03 NOTE — Telephone Encounter (Signed)
I spoke with the pt daughter and the pt has been admitted and he is in room # 1226. I will forward message to MR so he is aware. Carron Curie, CMA

## 2012-09-03 NOTE — Progress Notes (Signed)
Dr Rama paged for low BPs. 500 cc bolus ordered.

## 2012-09-03 NOTE — Progress Notes (Signed)
ANTICOAGULATION CONSULT NOTE - Initial Consult  Pharmacy Consult for Coumadin Indication: afib  No Known Allergies  Patient Measurements: Height: 5\' 10"  (177.8 cm) Weight: 166 lb 7.2 oz (75.5 kg) IBW/kg (Calculated) : 73  Vital Signs: Temp: 98.7 F (37.1 C) (03/28 0400) Temp src: Oral (03/28 0400) BP: 104/52 mmHg (03/28 0730) Pulse Rate: 117 (03/28 0730)  Labs:  Recent Labs  09/02/12 1052 09/02/12 1520 09/03/12 0341  HGB 8.0* 8.8* 8.3*  HCT 25.2* 26.5* 24.6*  PLT 242 283 263  APTT  --  58*  --   LABPROT  --  32.0* 38.9*  INR  --  3.34* 4.35*  CREATININE 1.2 1.15 1.10    Estimated Creatinine Clearance: 59.9 ml/min (by C-G formula based on Cr of 1.1).   Medical History: Past Medical History  Diagnosis Date  . Hypertension   . Hypercholesterolemia     statin intolerant  . Diabetes mellitus, type 2   . Benign prostatic hypertrophy   . Emphysema   . Lung cancer     Followed by Dr. [name] (LUL squamous cell carcinoma)  . COPD (chronic obstructive pulmonary disease)   . CKD (chronic kidney disease)   . Colitis   . Coronary artery disease     Cath 07/02/12 3v CAD w/ chronic occlusion of RCA (fills from left collaterals), severe stenosis prox LCx & moderately severe dz in mid LAD; s/p PTCA/BMS to prox LCx 07/12/12   . Normocytic anemia   . Persistent atrial fibrillation   . Atrial enlargement, left     Medications:  Prescriptions prior to admission  Medication Sig Dispense Refill  . aspirin EC 81 MG tablet Take 81 mg by mouth every other day.      . budesonide-formoterol (SYMBICORT) 160-4.5 MCG/ACT inhaler Inhale 2 puffs into the lungs 2 (two) times daily.      Marland Kitchen diltiazem (CARDIZEM CD) 180 MG 24 hr capsule Take 180 mg by mouth daily.      . finasteride (PROSCAR) 5 MG tablet Take 5 mg by mouth every morning.       . insulin detemir (LEVEMIR) 100 UNIT/ML injection Inject 18 Units into the skin at bedtime.      Marland Kitchen loperamide (IMODIUM A-D) 2 MG tablet Take 2 mg by  mouth 4 (four) times daily as needed for diarrhea or loose stools.      . metFORMIN (GLUCOPHAGE-XR) 500 MG 24 hr tablet Take 1,000 mg by mouth 2 (two) times daily.      . Tamsulosin HCl (FLOMAX) 0.4 MG CAPS Take 0.4 mg by mouth every morning.       . Ticagrelor (BRILINTA) 90 MG TABS tablet Take 1 tablet (90 mg total) by mouth 2 (two) times daily.  60 tablet  0  . [DISCONTINUED] diltiazem (CARDIZEM CD) 180 MG 24 hr capsule Take 1 capsule (180 mg total) by mouth daily.  30 capsule  0  . [DISCONTINUED] insulin detemir (LEVEMIR) 100 UNIT/ML injection Inject 18 Units into the skin at bedtime.  10 mL  0  . metoprolol (LOPRESSOR) 100 MG tablet Take 0.5 tablets (50 mg total) by mouth 2 (two) times daily.  60 tablet  0  . warfarin (COUMADIN) 3 MG tablet Take 4 mg by mouth daily at 6 PM.        Assessment: 77 YOM on coumadin PTA for hx Afib. Pt admitted 3/27 with weakness, N/V/D. Dose PTA 4mg  daily, was held PTA. INR on admission >3 so continued to hold coumadin  INR  today is increased, still supratx  Flagyl IV increases sensitivity to Coumadin  Goal of Therapy:  INR 2-3   Plan:  No coumadin today Daily INR  Gwen Her PharmD  [phone] 09/03/2012 8:09 AM

## 2012-09-04 ENCOUNTER — Inpatient Hospital Stay (HOSPITAL_COMMUNITY): Payer: Medicare Other

## 2012-09-04 ENCOUNTER — Encounter: Payer: Self-pay | Admitting: Internal Medicine

## 2012-09-04 DIAGNOSIS — E872 Acidosis, unspecified: Secondary | ICD-10-CM | POA: Diagnosis present

## 2012-09-04 DIAGNOSIS — I959 Hypotension, unspecified: Secondary | ICD-10-CM | POA: Diagnosis present

## 2012-09-04 DIAGNOSIS — R5383 Other fatigue: Secondary | ICD-10-CM | POA: Insufficient documentation

## 2012-09-04 LAB — BASIC METABOLIC PANEL
GFR calc Af Amer: 79 mL/min — ABNORMAL LOW (ref >90)
GFR calc non Af Amer: 68 mL/min — ABNORMAL LOW (ref >90)
Glucose, Bld: 44 mg/dL — CL (ref 70–99)
Potassium: 4.2 mEq/L (ref 3.5–5.1)
Sodium: 131 mEq/L — ABNORMAL LOW (ref 135–145)

## 2012-09-04 LAB — GLUCOSE, CAPILLARY: Glucose-Capillary: 305 mg/dL — ABNORMAL HIGH (ref 70–99)

## 2012-09-04 LAB — CLOSTRIDIUM DIFFICILE BY PCR: Toxigenic C. Difficile by PCR: POSITIVE — AB

## 2012-09-04 LAB — CBC
Hemoglobin: 8 g/dL — ABNORMAL LOW (ref 13.0–17.0)
RBC: 3.03 MIL/uL — ABNORMAL LOW (ref 4.22–5.81)

## 2012-09-04 MED ORDER — DEXTROSE 50 % IV SOLN
INTRAVENOUS | Status: AC
  Filled 2012-09-04: qty 50

## 2012-09-04 MED ORDER — DEXTROSE 50 % IV SOLN
25.0000 mL | Freq: Once | INTRAVENOUS | Status: AC
  Administered 2012-09-04: 25 mL via INTRAVENOUS

## 2012-09-04 MED ORDER — INSULIN DETEMIR 100 UNIT/ML ~~LOC~~ SOLN
5.0000 [IU] | Freq: Every day | SUBCUTANEOUS | Status: DC
  Administered 2012-09-04 – 2012-09-05 (×2): 5 [IU] via SUBCUTANEOUS
  Filled 2012-09-04 (×2): qty 0.05

## 2012-09-04 MED ORDER — SODIUM BICARBONATE 650 MG PO TABS
650.0000 mg | ORAL_TABLET | Freq: Four times a day (QID) | ORAL | Status: DC
  Administered 2012-09-04 – 2012-09-07 (×13): 650 mg via ORAL
  Filled 2012-09-04 (×16): qty 1

## 2012-09-04 MED ORDER — DILTIAZEM HCL 30 MG PO TABS
30.0000 mg | ORAL_TABLET | Freq: Four times a day (QID) | ORAL | Status: DC
  Administered 2012-09-04 – 2012-09-05 (×4): 30 mg via ORAL
  Filled 2012-09-04 (×8): qty 1

## 2012-09-04 MED ORDER — PHYTONADIONE 1 MG/0.5 ML ORAL SOLUTION
0.5000 mg | Freq: Once | ORAL | Status: AC
  Administered 2012-09-04: 0.5 mg via ORAL
  Filled 2012-09-04: qty 0.5

## 2012-09-04 MED ORDER — FUROSEMIDE 10 MG/ML IJ SOLN
40.0000 mg | Freq: Once | INTRAMUSCULAR | Status: AC
  Administered 2012-09-04: 40 mg via INTRAVENOUS
  Filled 2012-09-04: qty 4

## 2012-09-04 MED ORDER — ALBUTEROL SULFATE (5 MG/ML) 0.5% IN NEBU
2.5000 mg | INHALATION_SOLUTION | RESPIRATORY_TRACT | Status: DC | PRN
  Administered 2012-09-04 – 2012-09-07 (×2): 2.5 mg via RESPIRATORY_TRACT
  Filled 2012-09-04 (×2): qty 0.5

## 2012-09-04 NOTE — Progress Notes (Signed)
ANTICOAGULATION CONSULT NOTE - Follow up  Pharmacy Consult for Coumadin Indication: afib  No Known Allergies  Patient Measurements: Height: 5\' 10"  (177.8 cm) Weight: 164 lb 1.6 oz (74.435 kg) IBW/kg (Calculated) : 73  Vital Signs: Temp: 98.5 F (36.9 C) (03/29 0800) Temp src: Oral (03/29 0800) BP: 145/70 mmHg (03/29 0900) Pulse Rate: 57 (03/29 0900)  Labs:  Recent Labs  09/02/12 1520 09/03/12 0341 09/04/12 0415 09/04/12 0912  HGB 8.8* 8.3* 8.0*  --   HCT 26.5* 24.6* 24.1*  --   PLT 283 263 287  --   APTT 58*  --   --   --   LABPROT 32.0* 38.9*  --  48.7*  INR 3.34* 4.35*  --  5.89*  CREATININE 1.15 1.10 1.04  --     Estimated Creatinine Clearance: 63.4 ml/min (by C-G formula based on Cr of 1.04).   Medical History: Past Medical History  Diagnosis Date  . Hypertension   . Hypercholesterolemia     statin intolerant  . Diabetes mellitus, type 2   . Benign prostatic hypertrophy   . Emphysema   . Lung cancer     Followed by Dr. [name] (LUL squamous cell carcinoma)  . COPD (chronic obstructive pulmonary disease)   . CKD (chronic kidney disease)   . Colitis   . Coronary artery disease     Cath 07/02/12 3v CAD w/ chronic occlusion of RCA (fills from left collaterals), severe stenosis prox LCx & moderately severe dz in mid LAD; s/p PTCA/BMS to prox LCx 07/12/12   . Normocytic anemia   . Persistent atrial fibrillation   . Atrial enlargement, left     Medications:  Prescriptions prior to admission  Medication Sig Dispense Refill  . aspirin EC 81 MG tablet Take 81 mg by mouth every other day.      . budesonide-formoterol (SYMBICORT) 160-4.5 MCG/ACT inhaler Inhale 2 puffs into the lungs 2 (two) times daily.      Marland Kitchen diltiazem (CARDIZEM CD) 180 MG 24 hr capsule Take 180 mg by mouth daily.      . finasteride (PROSCAR) 5 MG tablet Take 5 mg by mouth every morning.       . insulin detemir (LEVEMIR) 100 UNIT/ML injection Inject 18 Units into the skin at bedtime.      Marland Kitchen  loperamide (IMODIUM A-D) 2 MG tablet Take 2 mg by mouth 4 (four) times daily as needed for diarrhea or loose stools.      . metFORMIN (GLUCOPHAGE-XR) 500 MG 24 hr tablet Take 1,000 mg by mouth 2 (two) times daily.      . Tamsulosin HCl (FLOMAX) 0.4 MG CAPS Take 0.4 mg by mouth every morning.       . Ticagrelor (BRILINTA) 90 MG TABS tablet Take 1 tablet (90 mg total) by mouth 2 (two) times daily.  60 tablet  0  . [DISCONTINUED] diltiazem (CARDIZEM CD) 180 MG 24 hr capsule Take 1 capsule (180 mg total) by mouth daily.  30 capsule  0  . [DISCONTINUED] insulin detemir (LEVEMIR) 100 UNIT/ML injection Inject 18 Units into the skin at bedtime.  10 mL  0  . metoprolol (LOPRESSOR) 100 MG tablet Take 0.5 tablets (50 mg total) by mouth 2 (two) times daily.  60 tablet  0  . warfarin (COUMADIN) 3 MG tablet Take 4 mg by mouth daily at 6 PM.        Assessment: 77 YOM on coumadin PTA for hx Afib. Pt admitted 3/27 with weakness,  N/V/D. Dose PTA 4mg  daily, was held PTA. INR on admission >3 so continued to hold coumadin  INR continues to increase despite no coumadin being given, supratherapeutic (5.89)  GI losses may be contributing  Flagyl IV increases sensitivity to Coumadin  Goal of Therapy:  INR 2-3   Plan:  No coumadin today Daily INR  Gwen Her PharmD  [phone] 09/04/2012 12:18 PM

## 2012-09-04 NOTE — Assessment & Plan Note (Signed)
Appears to have relative new onset orthostasis and fatigue. No fever. No GI volume loss. Has high wbc in setting of recent radiation. He appears non toxic but deconditioned. Will check bmet and cbc. D/w cardiology and have him stip his lopresor. They will check orthostatus at home . IF this persists, or he gets worse in any way they will go to ER and seek admission.   > 50% of this > 25 min visit spent in face to face counseling (15 min visit converted to 25 min)

## 2012-09-04 NOTE — Progress Notes (Signed)
PT Cancellation Note  Patient Details Name: Brandon Clay MRN: [id number] DOB: 05-19-1935   Cancelled Treatment:      Pt refused session today stating, " I want to take more of a nap, so I will try to work with you after i wake up in 3 hours. Then I will go home tonight".  Pt refusing session at this time.  Will check back with pt tomorrow.      Marella Bile 09/04/2012, 2:36 PM

## 2012-09-04 NOTE — Assessment & Plan Note (Signed)
Copd itself is stable but bigger problems is orthostsis and high wbc and fatigue. Will get PFTs at [facility]. Continue symbicort now

## 2012-09-04 NOTE — Progress Notes (Signed)
TELEMETRY: Reviewed telemetry pt in atrial fibrillation with rate 130  Filed Vitals:   09/04/12 0500 09/04/12 0600 09/04/12 0700 09/04/12 0800  BP: 125/61 136/79 123/89 132/70  Pulse: 121 121 120 101  Temp:      TempSrc:      Resp: 26 24 20 23   Height:      Weight:      SpO2: 99% 93% 100% 100%    Intake/Output Summary (Last 24 hours) at 09/04/12 0905 Last data filed at 09/04/12 0800  Gross per 24 hour  Intake 2502.45 ml  Output   1400 ml  Net 1102.45 ml    SUBJECTIVE No chest pain or dyspnea.  Heart rate still fast at rest. BP is adequate now. LABS: Basic Metabolic Panel:  Recent Labs  40/98/11 1453  09/02/12 1515 09/02/12 1520 09/03/12 0341 09/04/12 0415  NA 134*  < >  --  134* 135 131*  K 3.9  < >  --  3.6 3.0* 4.2  CL 102  < >  --  98 103 102  CO2 21  < >  --  20 19 17*  GLUCOSE 161*  < >  --  126* 47* 44*  BUN 24*  < >  --  24* 24* 24*  CREATININE 1.3  < >  --  1.15 1.10 1.04  CALCIUM 8.1*  < >  --  8.2* 7.6* 7.3*  MG 1.2*  --   --  1.2*  --   --   PHOS  --   --  2.7 2.6  --   --   < > = values in this interval not displayed. Liver Function Tests:  Recent Labs  09/02/12 1520 09/03/12 0341  AST 12 12  ALT 7 7  ALKPHOS 115 102  BILITOT 0.4 0.3  PROT 5.8* 5.1*  ALBUMIN 2.4* 2.0*   No results found for this basename: LIPASE, AMYLASE,  in the last 72 hours CBC:  Recent Labs  09/02/12 1052 09/02/12 1520 09/03/12 0341 09/04/12 0415  WBC 32.7* 31.9* 29.4* 27.1*  NEUTROABS 30.7* 29.8*  --   --   HGB 8.0* 8.8* 8.3* 8.0*  HCT 25.2* 26.5* 24.6* 24.1*  MCV 80.0 80.8 79.1 79.5  PLT 242 283 263 287   Hemoglobin A1C:  Recent Labs  09/02/12 1520  HGBA1C 6.7*   Fasting Lipid Panel: No results found for this basename: CHOL, HDL, LDLCALC, TRIG, CHOLHDL, LDLDIRECT,  in the last 72 hours Thyroid Function Tests:  Recent Labs  09/03/12 0341  TSH 0.845   Radiology/Studies:  Dg Chest Port 1 View  09/03/2012  *RADIOLOGY REPORT*  Clinical Data:  Pulmonary edema.  History of lung cancer.  PORTABLE CHEST - 1 VIEW  Comparison: PA and lateral chest 07/24/2012.  Findings: Left upper lobe mass is again seen measuring approximately 4.4 cm cranial-caudal, not markedly changed.  The lungs are emphysematous.  Bilateral alveolar and interstitial opacities have worsened.  There is cardiomegaly.  No pneumothorax or pleural fluid.  IMPRESSION:  1.  Bilateral alveolar and interstitial opacities superimposed on chronic change are likely due to pulmonary edema in this patient with cardiomegaly. Multi focal infection or interstitial tumor spread are felt less likely. 2.  Left upper lobe mass.   Original Report Authenticated By: Holley Dexter, M.D.     PHYSICAL EXAM General: Elderly, chronically ill appearing, in no acute distress. Head: Normal Neck: Negative for carotid bruits. JVD not elevated. Lungs: Clear bilaterally. Heart: IRRR S1 S2 without murmurs,  rubs, or gallops.  Abdomen: Soft, non-tender, non-distended with normoactive bowel sounds. No hepatomegaly. No rebound/guarding. No obvious abdominal masses. Extremities: traceedema.  Distal pedal pulses are 2+ and equal bilaterally. Neuro: nonfocal  ASSESSMENT AND PLAN: 1. Atrial fibrillation with RVR.  On coumadin with target INR 2-3. TSH is normal. BP is now adequate. He was on both BB and diltiazem at home.  Will re-introduce low dose diltiazem with parameters. 2. CAD s/p BMS 07/12/12. Continue ASA 81 mg daily. 3. C. Diff colitis. 4. RLE cellulitis. 5. Lung CA. Poor candidate for resection. 6. DM 7. COPD.   Principal Problem:   Generalized weakness with diarrhea, vomiting, and abdominal pain in the setting of a recent history of Clostridium difficile colitis Active Problems:   Hypertensive heart disease   Type 2 diabetes mellitus with vascular disease   Benign prostatic hypertrophy   Squamous cell lung cancer left upper lobe   COPD (chronic obstructive pulmonary disease)   CAD (coronary  artery disease), native coronary artery   Anemia of chronic disease   Leukocytosis   Rapid atrial fibrillation   Cellulitis of right lower extremity   Hypokalemia   Metabolic acidosis   Hypotension, unspecified    Karie Schwalbe MD,FACC 09/04/2012 9:05 AM

## 2012-09-04 NOTE — Progress Notes (Signed)
[facility] PROGRESS NOTE  Brandon Clay Min JYN:[id number] DOB: 1934-12-12 DOA: 09/02/2012 PCP: [name], MD  Brief narrative: Brandon Clay is an 78 y.o. 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 difficile colitis requiring PO vancomycin, atrial fibrillation on chronic Coumadin, 3 vessel CAD with chronic occlusion of RCA, severe stenosis in proximal LCx & moderately severe disease in mid LAD, S/P PTCA, on ASA and Brilinta (Plavix nonresponder), recent admission 07/24/12-08/02/12 for treatment of C. Diff who was sent to the hospital from radiation for evaluation of generalized weakness, vomiting and diarrhea. Patient was receiving stereotactic body radiotherapy (#4 / 5 planned treatments) and was noticed to be very weak and dizzy upon standing up.  On admission, he was was noted to be in atrial fibrillation with heart rates in 150s. He also had significant leukocytosis of 32.7. The source of his leukocytosis is not entirely clear but may be secondary to recurrent Clostridium difficile versus right lower extremity cellulitis.  Assessment/Plan: Principal Problem:  Generalized weakness in the setting of nausea, vomiting, diarrhea, dehydration / rule out recurrent Clostridium difficile colitis -Worrisome for recurrent C. difficile colitis. No significant diarrhea since admission.   Followup results of C. difficile PCR (sent and in process). -Continue gentle hydration. -Replace electrolytes as needed.  -Continue antiemetics as needed. -Physical and occupational therapy evaluations requested. Active Problems: Metabolic acidosis -Likely from bicarbonate losses in the stool. We'll replace orally. -Lactic acid normal at 1.2. Right lower extremity cellulitis -Small fluctuant area noted RLE which may require debridement.  -Will treat with IV vancomycin and if he does not respond, obtain an orthopedics evaluation for incision and drainage. Atrial  fibrillation with RVR  -Known history of atrial fibrillation, on Coumadin. Goal INR 2-3. -Initially treated with Cardizem drip and metoprolol. Cardizem drip discontinued secondary to low blood pressure and given digoxin overnight on 09/02/2012.  Now on Amiodarone drip. -Seen by cardiologist on 09/02/2012. Trigger felt to be from infection and dehydration. Leukocytosis  -Most likely from infection (recurrent C. difficile colitis versus cellulitis).  -Continue Flagyl and oral/IV vancomycin. H/O Hypertension / Hypotension  -Blood pressure currently well-controlled (and on low side). -Has required intermittent IVF boluses to treat low BP. Hypercholesterolemia  -History of statin intolerance. 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 CBGs 47-156. -Metformin currently on hold.  Decrease Levemir to 5 units at bedtime and continue sliding scale insulin before meals only (no HS coverage). 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 Trina Ao care, felt to be too weak to proceed with lung resection. COPD (chronic obstructive pulmonary disease)  -Stable. Continue Symbicort. CAD (coronary artery disease), native coronary artery  -On outpatient Brilinta and aspirin.  -Now off Brilinta per cardiology recommendations. Anemia of chronic disease  -Secondary to history multiple medical problems including squamous cell lung cancer.  -No signs of active bleed. Hemoglobin stable with no indication for transfusion. Benign prostatic hypertrophy  -Continue Finasteride and Flomax.  Code Status: Full  Family Communication: Wife updated at bedside 09/03/12, no one at bedside today.  Disposition Plan: Home when stable.   Medical Consultants:  Dr. Olga Millers, Cardiology.  Other Consultants:  None.  Anti-infectives:  Vancomycin 09/02/2012--->  Flagyl 09/02/2012---->  IV Vancomycin  09/03/12--->  HPI/Subjective: Brandon Clay has had 2 loose stools over the past 24 hours. He is otherwise without complaint. Still has some pain at the right anterior lower leg where  he has an area of cellulitis.  Objective: Filed Vitals:   09/04/12 0400 09/04/12 0402 09/04/12 0500 09/04/12 0600  BP: 121/59  125/61 136/79  Pulse: 133  121 121  Temp:  97.8 F (36.6 C)    TempSrc:  Oral    Resp: 29  26 24   Height:      Weight:  74.435 kg (164 lb 1.6 oz)    SpO2: 98%  99% 93%    Intake/Output Summary (Last 24 hours) at 09/04/12 0708 Last data filed at 09/04/12 0600  Gross per 24 hour  Intake 2793.15 ml  Output   1350 ml  Net 1443.15 ml    Exam: Gen:  NAD, non-toxic appearing Cardiovascular:  HSIR, tachy Respiratory:  Lungs CTAB Gastrointestinal:  Abdomen soft, NT/ND, + BS Extremities:  1+ edema, focal area 3 cm swelling, induration, redness RLE superior to ankle which appears to be slightly less red today.  Data Reviewed: Basic Metabolic Panel:  Recent Labs Lab 09/01/12 1453 09/02/12 1052 09/02/12 1515 09/02/12 1520 09/03/12 0341 09/04/12 0415  NA 134* 136  --  134* 135 131*  K 3.9 3.6  --  3.6 3.0* 4.2  CL 102 105  --  98 103 102  CO2 21 19*  --  20 19 17*  GLUCOSE 161* 114*  --  126* 47* 44*  BUN 24* 24.2  --  24* 24* 24*  CREATININE 1.3 1.2  --  1.15 1.10 1.04  CALCIUM 8.1* 7.8*  --  8.2* 7.6* 7.3*  MG 1.2*  --   --  1.2*  --   --   PHOS  --   --  2.7 2.6  --   --    GFR Estimated Creatinine Clearance: 63.4 ml/min (by C-G formula based on Cr of 1.04). Liver Function Tests:  Recent Labs Lab 09/02/12 1052 09/02/12 1520 09/03/12 0341  AST 8 12 12   ALT 6 7 7   ALKPHOS 102 115 102  BILITOT 0.54 0.4 0.3  PROT 5.4* 5.8* 5.1*  ALBUMIN 2.1* 2.4* 2.0*   Coagulation profile  Recent Labs Lab 09/02/12 1520 09/03/12 0341  INR 3.34* 4.35*    CBC:  Recent Labs Lab 08/31/12 1305 09/01/12 1453 09/02/12 1052 09/02/12 1520 09/03/12 0341  09/04/12 0415  WBC 21.4* 22.9 Repeated and verified X2.* 32.7* 31.9* 29.4* 27.1*  NEUTROABS 19.0*  --  30.7* 29.8*  --   --   HGB 8.9* 9.6* 8.0* 8.8* 8.3* 8.0*  HCT 27.6* 29.2* 25.2* 26.5* 24.6* 24.1*  MCV 80.9 81.3 80.0 80.8 79.1 79.5  PLT 252 307.0 242 283 263 287   BNP (last 3 results)  Recent Labs  07/24/12 2140 08/11/12 1346  PROBNP 15551.0* 463.0*   CBG:  Recent Labs Lab 09/03/12 0811 09/03/12 0852 09/03/12 1128 09/03/12 1703 09/03/12 2139  GLUCAP 47* 151* 156* 140* 113*   Hgb A1c  Recent Labs  09/02/12 1520  HGBA1C 6.7*   Microbiology Recent Results (from the past 240 hour(s))  MRSA PCR SCREENING     Status: None   Collection Time    09/02/12 10:21 PM      Result Value Range Status   MRSA by PCR NEGATIVE  NEGATIVE Final   Comment:            The GeneXpert MRSA Assay (FDA     approved for NASAL specimens     only), is one component of a     comprehensive MRSA colonization     surveillance program.  It is not     intended to diagnose MRSA     infection nor to guide or     monitor treatment for     MRSA infections.     Procedures and Diagnostic Studies: No results found.  Scheduled Meds: . aspirin EC  81 mg Oral QODAY  . budesonide-formoterol  2 puff Inhalation BID  . feeding supplement  237 mL Oral BID BM  . finasteride  5 mg Oral q morning - 10a  . insulin aspart  0-15 Units Subcutaneous TID WC  . insulin detemir  5 Units Subcutaneous QHS  . metoprolol  50 mg Oral BID  . metronidazole  500 mg Intravenous Q8H  . multivitamin with minerals  1 tablet Oral Daily  . nutrition supplement  1 packet Oral BID PC  . sodium chloride  3 mL Intravenous Q12H  . tamsulosin  0.4 mg Oral q morning - 10a  . vancomycin  1,250 mg Intravenous Q24H  . vancomycin  125 mg Oral Q6H   Continuous Infusions: . sodium chloride 75 mL/hr at 09/03/12 2302  . amiodarone (NEXTERONE PREMIX) 360 mg/200 mL dextrose 30 mg/hr (09/04/12 0146)    Time spent: 35 minutes.    LOS: 2 days   Brandon Clay  [facility] Pager [phone].  If 8PM-8AM, please contact night-coverage at [email or web address], password [facility] 09/04/2012, 7:08 AM

## 2012-09-04 NOTE — Progress Notes (Signed)
Gave Albuterol treatment for shortness of breath and wheezing. Patient stated that he could not get his breath. Will continue to monitor !!

## 2012-09-05 LAB — GLUCOSE, CAPILLARY
Glucose-Capillary: 234 mg/dL — ABNORMAL HIGH (ref 70–99)
Glucose-Capillary: 273 mg/dL — ABNORMAL HIGH (ref 70–99)
Glucose-Capillary: 332 mg/dL — ABNORMAL HIGH (ref 70–99)

## 2012-09-05 LAB — CBC
HCT: 25.3 % — ABNORMAL LOW (ref 39.0–52.0)
MCV: 79.6 fL (ref 78.0–100.0)
RBC: 3.18 MIL/uL — ABNORMAL LOW (ref 4.22–5.81)
WBC: 18.8 10*3/uL — ABNORMAL HIGH (ref 4.0–10.5)

## 2012-09-05 LAB — PROTIME-INR: Prothrombin Time: 21.6 seconds — ABNORMAL HIGH (ref 11.6–15.2)

## 2012-09-05 LAB — BASIC METABOLIC PANEL
BUN: 29 mg/dL — ABNORMAL HIGH (ref 6–23)
CO2: 18 mEq/L — ABNORMAL LOW (ref 19–32)
Chloride: 99 mEq/L (ref 96–112)
Creatinine, Ser: 1.14 mg/dL (ref 0.50–1.35)

## 2012-09-05 MED ORDER — INSULIN ASPART 100 UNIT/ML ~~LOC~~ SOLN
0.0000 [IU] | Freq: Every day | SUBCUTANEOUS | Status: DC
  Administered 2012-09-05: 3 [IU] via SUBCUTANEOUS

## 2012-09-05 MED ORDER — INSULIN ASPART 100 UNIT/ML ~~LOC~~ SOLN
0.0000 [IU] | Freq: Three times a day (TID) | SUBCUTANEOUS | Status: DC
  Administered 2012-09-05: 11 [IU] via SUBCUTANEOUS
  Administered 2012-09-05: 5 [IU] via SUBCUTANEOUS
  Administered 2012-09-05: 8 [IU] via SUBCUTANEOUS

## 2012-09-05 MED ORDER — VANCOMYCIN HCL 10 G IV SOLR: 1500.0000 mg | INTRAVENOUS | Status: DC

## 2012-09-05 MED ORDER — WARFARIN SODIUM 2 MG PO TABS
2.0000 mg | ORAL_TABLET | Freq: Once | ORAL | Status: AC
  Administered 2012-09-05: 2 mg via ORAL
  Filled 2012-09-05: qty 1

## 2012-09-05 MED ORDER — DILTIAZEM HCL ER COATED BEADS 120 MG PO CP24
120.0000 mg | ORAL_CAPSULE | Freq: Every day | ORAL | Status: DC
  Administered 2012-09-05 – 2012-09-07 (×3): 120 mg via ORAL
  Filled 2012-09-05 (×5): qty 1

## 2012-09-05 MED ORDER — VANCOMYCIN HCL 10 G IV SOLR
1500.0000 mg | INTRAVENOUS | Status: DC
  Administered 2012-09-05 – 2012-09-06 (×2): 1500 mg via INTRAVENOUS
  Filled 2012-09-05 (×3): qty 1500

## 2012-09-05 MED ORDER — WARFARIN - PHARMACIST DOSING INPATIENT: Freq: Every day | Status: DC

## 2012-09-05 NOTE — Progress Notes (Signed)
ANTICOAGULATION CONSULT NOTE - Follow up  Pharmacy Consult for Coumadin Indication: afib  No Known Allergies  Patient Measurements: Height: 5\' 10"  (177.8 cm) Weight: 180 lb 6.4 oz (81.829 kg) IBW/kg (Calculated) : 73  Vital Signs: Temp: 97.6 F (36.4 C) (03/30 0400) Temp src: Oral (03/30 0400) BP: 117/56 mmHg (03/30 0600) Pulse Rate: 97 (03/30 0600)  Labs:  Recent Labs  09/02/12 1520 09/03/12 0341 09/04/12 0415 09/04/12 0912 09/05/12 0351  HGB 8.8* 8.3* 8.0*  --  8.4*  HCT 26.5* 24.6* 24.1*  --  25.3*  PLT 283 263 287  --  247  APTT 58*  --   --   --   --   LABPROT 32.0* 38.9*  --  48.7* 21.6*  INR 3.34* 4.35*  --  5.89* 1.96*  CREATININE 1.15 1.10 1.04  --  1.14    Estimated Creatinine Clearance: 57.8 ml/min (by C-G formula based on Cr of 1.14).   Medical History: Past Medical History  Diagnosis Date  . Hypertension   . Hypercholesterolemia     statin intolerant  . Diabetes mellitus, type 2   . Benign prostatic hypertrophy   . Emphysema   . Lung cancer     Followed by Dr. [name] (LUL squamous cell carcinoma)  . COPD (chronic obstructive pulmonary disease)   . CKD (chronic kidney disease)   . Colitis   . Coronary artery disease     Cath 07/02/12 3v CAD w/ chronic occlusion of RCA (fills from left collaterals), severe stenosis prox LCx & moderately severe dz in mid LAD; s/p PTCA/BMS to prox LCx 07/12/12   . Normocytic anemia   . Persistent atrial fibrillation   . Atrial enlargement, left     Medications:  Prescriptions prior to admission  Medication Sig Dispense Refill  . aspirin EC 81 MG tablet Take 81 mg by mouth every other day.      . budesonide-formoterol (SYMBICORT) 160-4.5 MCG/ACT inhaler Inhale 2 puffs into the lungs 2 (two) times daily.      Marland Kitchen diltiazem (CARDIZEM CD) 180 MG 24 hr capsule Take 180 mg by mouth daily.      . finasteride (PROSCAR) 5 MG tablet Take 5 mg by mouth every morning.       . insulin detemir (LEVEMIR) 100 UNIT/ML  injection Inject 18 Units into the skin at bedtime.      Marland Kitchen loperamide (IMODIUM A-D) 2 MG tablet Take 2 mg by mouth 4 (four) times daily as needed for diarrhea or loose stools.      . metFORMIN (GLUCOPHAGE-XR) 500 MG 24 hr tablet Take 1,000 mg by mouth 2 (two) times daily.      . Tamsulosin HCl (FLOMAX) 0.4 MG CAPS Take 0.4 mg by mouth every morning.       . Ticagrelor (BRILINTA) 90 MG TABS tablet Take 1 tablet (90 mg total) by mouth 2 (two) times daily.  60 tablet  0  . [DISCONTINUED] diltiazem (CARDIZEM CD) 180 MG 24 hr capsule Take 1 capsule (180 mg total) by mouth daily.  30 capsule  0  . [DISCONTINUED] insulin detemir (LEVEMIR) 100 UNIT/ML injection Inject 18 Units into the skin at bedtime.  10 mL  0  . metoprolol (LOPRESSOR) 100 MG tablet Take 0.5 tablets (50 mg total) by mouth 2 (two) times daily.  60 tablet  0  . warfarin (COUMADIN) 3 MG tablet Take 4 mg by mouth daily at 6 PM.        Assessment: 77 YOM  on coumadin PTA for hx Afib. Pt admitted 3/27 with weakness, N/V/D. Dose PTA 4mg  daily, was held PTA. INR on admission >3 so continued to hold coumadin  INR down after increasing for several days despite no coumadin, now below tx range: (5.89-->1.96)  Flagyl IV increases sensitivity to Coumadin  Pt is also on amiodarone IV. May need dose reduction of Coumadin if cont on this Rx OP. Due to long half life of amiodarone, would not see this effect for several weeks.  Carb modified diet and oral nutritional supplements  Goal of Therapy:  INR 2-3   Plan:  Coumadin 2mg  today Daily INR  Gwen Her PharmD  [phone] 09/05/2012 7:52 AM

## 2012-09-05 NOTE — Progress Notes (Addendum)
TELEMETRY: Telemetry shows atrial fib with controlled ventricular response.  Filed Vitals:   09/05/12 0400 09/05/12 0500 09/05/12 0600 09/05/12 0800  BP: 113/66 104/54 117/56   Pulse: 100 96 97   Temp: 97.6 F (36.4 C)   97.3 F (36.3 C)  TempSrc: Oral   Oral  Resp: 25 23 25    Height:      Weight:      SpO2: 99% 100% 99%     Intake/Output Summary (Last 24 hours) at 09/05/12 0915 Last data filed at 09/05/12 0600  Gross per 24 hour  Intake 2042.3 ml  Output    995 ml  Net 1047.3 ml    SUBJECTIVE No chest pain or dyspnea.  Heart rate improved today after addition of oral diltiazem yesterday. LABS: Basic Metabolic Panel:  Recent Labs  29/56/21 1515 09/02/12 1520  09/04/12 0415 09/05/12 0351  NA  --  134*  < > 131* 130*  K  --  3.6  < > 4.2 4.4  CL  --  98  < > 102 99  CO2  --  20  < > 17* 18*  GLUCOSE  --  126*  < > 44* 252*  BUN  --  24*  < > 24* 29*  CREATININE  --  1.15  < > 1.04 1.14  CALCIUM  --  8.2*  < > 7.3* 7.2*  MG  --  1.2*  --   --   --   PHOS 2.7 2.6  --   --   --   < > = values in this interval not displayed. Liver Function Tests:  Recent Labs  09/02/12 1520 09-21-2012 0341  AST 12 12  ALT 7 7  ALKPHOS 115 102  BILITOT 0.4 0.3  PROT 5.8* 5.1*  ALBUMIN 2.4* 2.0*   No results found for this basename: LIPASE, AMYLASE,  in the last 72 hours CBC:  Recent Labs  09/02/12 1052 09/02/12 1520  09/04/12 0415 09/05/12 0351  WBC 32.7* 31.9*  < > 27.1* 18.8*  NEUTROABS 30.7* 29.8*  --   --   --   HGB 8.0* 8.8*  < > 8.0* 8.4*  HCT 25.2* 26.5*  < > 24.1* 25.3*  MCV 80.0 80.8  < > 79.5 79.6  PLT 242 283  < > 287 247  < > = values in this interval not displayed. Hemoglobin A1C:  Recent Labs  09/02/12 1520  HGBA1C 6.7*   Fasting Lipid Panel: No results found for this basename: CHOL, HDL, LDLCALC, TRIG, CHOLHDL, LDLDIRECT,  in the last 72 hours Thyroid Function Tests:  Recent Labs  09/21/12 0341  TSH 0.845   Radiology/Studies:  Dg  Chest Port 1 View  09-21-12  *RADIOLOGY REPORT*  Clinical Data: Pulmonary edema.  History of lung cancer.  PORTABLE CHEST - 1 VIEW  Comparison: PA and lateral chest 07/24/2012.  Findings: Left upper lobe mass is again seen measuring approximately 4.4 cm cranial-caudal, not markedly changed.  The lungs are emphysematous.  Bilateral alveolar and interstitial opacities have worsened.  There is cardiomegaly.  No pneumothorax or pleural fluid.  IMPRESSION:  1.  Bilateral alveolar and interstitial opacities superimposed on chronic change are likely due to pulmonary edema in this patient with cardiomegaly. Multi focal infection or interstitial tumor spread are felt less likely. 2.  Left upper lobe mass.   Original Report Authenticated By: Holley Dexter, M.D.     PHYSICAL EXAM General: Elderly, chronically ill appearing, in no acute distress.  Head: Normal Neck: Negative for carotid bruits. JVD not elevated. Lungs: Clear bilaterally. Heart: IRRR S1 S2 without murmurs, rubs, or gallops.  Abdomen: Soft, non-tender, non-distended with normoactive bowel sounds. No hepatomegaly. No rebound/guarding. No obvious abdominal masses. Extremities: traceedema.  Distal pedal pulses are 2+ and equal bilaterally. Neuro: nonfocal  ASSESSMENT AND PLAN: 1. Atrial fibrillation with RVR.  On coumadin with target INR 2-3. TSH is normal. BP is now adequate. He was on both BB and diltiazem at home.  Will stop IV amiodarone now and continue BB and switch to cardizem CD. 2. CAD s/p BMS 07/12/12. Continue ASA 81 mg daily. 3. C. Diff colitis. 4. RLE cellulitis. 5. Lung CA. Poor candidate for resection. 6. DM 7. COPD.   Principal Problem:   Generalized weakness with diarrhea, vomiting, and abdominal pain in the setting of a recent history of Clostridium difficile colitis Active Problems:   Hypertensive heart disease   Type 2 diabetes mellitus with vascular disease   Benign prostatic hypertrophy   Squamous cell lung  cancer left upper lobe   COPD (chronic obstructive pulmonary disease)   CAD (coronary artery disease), native coronary artery   Anemia of chronic disease   Leukocytosis   Rapid atrial fibrillation   Cellulitis of right lower extremity   Hypokalemia   Metabolic acidosis   Hypotension, unspecified    Karie Schwalbe MD,FACC 09/05/2012 9:15 AM

## 2012-09-05 NOTE — Progress Notes (Addendum)
ANTIBIOTIC CONSULT NOTE - Follow Up  Pharmacy Consult for Vancomycin IV Indication: Cellulitis  No Known Allergies  Patient Measurements: Height: 5\' 10"  (177.8 cm) Weight: 180 lb 6.4 oz (81.829 kg) IBW/kg (Calculated) : 73   Vital Signs: Temp: 97.6 F (36.4 C) (03/30 0400) Temp src: Oral (03/30 0400) BP: 117/56 mmHg (03/30 0600) Pulse Rate: 97 (03/30 0600) Intake/Output from previous day: 03/29 0701 - 03/30 0700 In: 2134 [I.V.:1834; IV Piggyback:300] Out: 1045 [Urine:1045] Intake/Output from this shift:    Labs:  Recent Labs  09/03/12 0341 09/04/12 0415 09/05/12 0351  WBC 29.4* 27.1* 18.8*  HGB 8.3* 8.0* 8.4*  PLT 263 287 247  CREATININE 1.10 1.04 1.14   Estimated Creatinine Clearance: 57.8 ml/min (by C-G formula based on Cr of 1.14). No results found for this basename: VANCOTROUGH, Leodis Binet, VANCORANDOM, GENTTROUGH, GENTPEAK, GENTRANDOM, TOBRATROUGH, TOBRAPEAK, TOBRARND, AMIKACINPEAK, AMIKACINTROU, AMIKACIN,  in the last 72 hours   Microbiology: Recent Results (from the past 720 hour(s))  MRSA PCR SCREENING     Status: None   Collection Time    09/02/12 10:21 PM      Result Value Range Status   MRSA by PCR NEGATIVE  NEGATIVE Final   Comment:            The GeneXpert MRSA Assay (FDA     approved for NASAL specimens     only), is one component of a     comprehensive MRSA colonization     surveillance program. It is not     intended to diagnose MRSA     infection nor to guide or     monitor treatment for     MRSA infections.  CLOSTRIDIUM DIFFICILE BY PCR     Status: Abnormal   Collection Time    09/03/12 11:27 PM      Result Value Range Status   C difficile by pcr POSITIVE (*) NEGATIVE Final   Comment: CRITICAL RESULT CALLED TO, READ BACK BY AND VERIFIED WITH:     RICHARDSON,N RN 09/03/12 1410 WOOTEN,K    Medical History: Past Medical History  Diagnosis Date  . Hypertension   . Hypercholesterolemia     statin intolerant  . Diabetes mellitus, type  2   . Benign prostatic hypertrophy   . Emphysema   . Lung cancer     Followed by Dr. [name] (LUL squamous cell carcinoma)  . COPD (chronic obstructive pulmonary disease)   . CKD (chronic kidney disease)   . Colitis   . Coronary artery disease     Cath 07/02/12 3v CAD w/ chronic occlusion of RCA (fills from left collaterals), severe stenosis prox LCx & moderately severe dz in mid LAD; s/p PTCA/BMS to prox LCx 07/12/12   . Normocytic anemia   . Persistent atrial fibrillation   . Atrial enlargement, left     Medications:  Scheduled:  . aspirin EC  81 mg Oral QODAY  . budesonide-formoterol  2 puff Inhalation BID  . diltiazem  30 mg Oral Q6H  . feeding supplement  237 mL Oral BID BM  . finasteride  5 mg Oral q morning - 10a  . [COMPLETED] furosemide  40 mg Intravenous Once  . insulin aspart  0-15 Units Subcutaneous TID WC  . insulin aspart  0-5 Units Subcutaneous QHS  . insulin detemir  5 Units Subcutaneous QHS  . metoprolol  50 mg Oral BID  . metronidazole  500 mg Intravenous Q8H  . multivitamin with minerals  1 tablet Oral Daily  .  nutrition supplement  1 packet Oral BID PC  . [COMPLETED] phytonadione  0.5 mg Oral Once  . sodium bicarbonate  650 mg Oral QID  . sodium chloride  3 mL Intravenous Q12H  . tamsulosin  0.4 mg Oral q morning - 10a  . vancomycin  1,250 mg Intravenous Q24H  . vancomycin  125 mg Oral Q6H  . warfarin  2 mg Oral ONCE-1800  . [START ON 09/06/2012] Warfarin - Pharmacist Dosing Inpatient   Does not apply q1800  . [DISCONTINUED] insulin aspart  0-15 Units Subcutaneous TID WC   Infusions:  . sodium chloride 75 mL/hr at 09/04/12 1916  . amiodarone (NEXTERONE PREMIX) 360 mg/200 mL dextrose 30 mg/hr (09/04/12 2355)   PRN: acetaminophen, acetaminophen, albuterol, HYDROcodone-acetaminophen, ondansetron (ZOFRAN) IV, ondansetron Assessment: 77 YOM admitted 3/27 w/ weakness and NVD. PMH: recent Cdiff colitis, Afib, CAD, squamous cell lung cancer.   D# 4 Vancomycin  IV 1250mg  q24h for R LE cellulitis  Scr 1.14, CrCl 58 ml/min (73ml/min/1.73m2)  WBC still elevated but trending down (32.7 --> 18.8)  Goal of Therapy:  Vancomycin trough level 10-15 mcg/ml  Plan:  Continue current vancomycin dose Vancomycin trough today   Gwen Her PharmD  [phone] 09/05/2012 8:06 AM    Addendum:   Clement Husbands trough subtx (7.1) Will increase to 1500g q24h  Gwen Her PharmD  [phone] 09/05/2012 4:21 PM

## 2012-09-05 NOTE — Progress Notes (Signed)
[facility] PROGRESS NOTE  Brandon Clay HYQ:[id number] DOB: 1935/02/05 DOA: 09/02/2012 PCP: [name], MD  Brief narrative: Brandon Clay is an 78 y.o. 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 difficile colitis requiring PO vancomycin, atrial fibrillation on chronic Coumadin, 3 vessel CAD with chronic occlusion of RCA, severe stenosis in proximal LCx & moderately severe disease in mid LAD, S/P PTCA, on ASA and Brilinta (Plavix nonresponder), recent admission 07/24/12-08/02/12 for treatment of C. Diff who was sent to the hospital from radiation for evaluation of generalized weakness, vomiting and diarrhea. Patient was receiving stereotactic body radiotherapy (#4 / 5 planned treatments) and was noticed to be very weak and dizzy upon standing up.  On admission, he was was noted to be in atrial fibrillation with heart rates in 150s. He also had significant leukocytosis of 32.7. He is currently being treated for recurrent Clostridium difficile infection as well as right lower extremity cellulitis, and over the course of his hospital stay, has shown gradual improvement in his clinical status.  Assessment/Plan: Principal Problem:  Generalized weakness in the setting of nausea, vomiting, diarrhea, dehydration / Recurrent Clostridium difficile colitis -Likely from recurrent C. difficile colitis.  C. difficile PCR positive. N/V resolved.  2-3 stools/24 hours. -D/C IVF due to concerns for fluid overload. -Replace electrolytes as needed.  -Continue antiemetics as needed. -Physical and occupational therapy evaluations requested however patient has refused participation. Active Problems: Dyspnea -Patient became dyspneic 09/04/2012 overnight. A chest x-ray was done which showed questionable infiltrates consistent with edema versus infection. Given 40 mg of Lasix IV.  Breathing better this a.m. -Pro BNP markedly elevated at 25,958.   Supratherapeutic  INR -Given 0.5 mg of vitamin K 09/04/2012 (INR 5.89--->1.96.) Metabolic acidosis -Likely from bicarbonate losses in the stool. Continue oral replacement. -Lactic acid normal at 1.2. Right lower extremity cellulitis -Small fluctuant area noted RLE.  -Continue IV vancomycin, area improving with no current indication for debridement. Atrial fibrillation with RVR  -Known history of atrial fibrillation, on Coumadin. Goal INR 2-3. -Initially treated with Cardizem drip and metoprolol. Cardizem drip discontinued secondary to low blood pressure and given digoxin overnight on 09/02/2012.  Now on Amiodarone drip. -Seen by cardiologist on 09/02/2012. Trigger felt to be from infection and dehydration. -Heart rate improved 90s-low 100s. Leukocytosis  -Most likely from infection (recurrent C. difficile colitis versus cellulitis).  -Continue Flagyl and oral/IV vancomycin. -White blood cell count beginning to drop. H/O Hypertension / Hypotension  -Blood pressure currently well-controlled (and on low side). -Has required intermittent IVF boluses to treat low BP. -KVO IVF given volume overload. Hypercholesterolemia  -History of statin intolerance. 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 CBGs 102-159 with one outlying value greater than 300. -Metformin currently on hold.  Continue Levemir 5 units at bedtime and continue sliding scale insulin before meals. Add at bedtime coverage. 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 Trina Ao care, felt to be too weak to proceed with lung resection.  For final radiation treatment 09/06/12. COPD (chronic obstructive pulmonary disease)  -Stable. Continue Symbicort. CAD (coronary artery disease), native coronary artery  -On outpatient Brilinta and aspirin.  -Now off Brilinta per cardiology recommendations. Anemia of chronic disease  -Secondary to history multiple medical problems  including squamous cell lung cancer.  -No signs of active bleed. Hemoglobin stable with no indication for transfusion. Benign prostatic hypertrophy  -Continue Finasteride and Flomax.  Code Status: Full  Family Communication: Wife updated at bedside 09/03/12, no one at bedside. Called and updated her by telephone today. Disposition Plan: Home when stable.   Medical Consultants:  Dr. Olga Millers, Cardiology.  Other Consultants:  Physical therapy: Has refused participation thus far, encouraged to work with the physical therapists.  Anti-infectives:  Vancomycin 09/02/2012--->  Flagyl 09/02/2012---->  IV Vancomycin 09/03/12--->  HPI/Subjective: Brandon Clay has had 2-3 loose stools over the past 24 hours. He had an episode of dyspnea last night which prompted the cross covering physician to order a chest x-ray and give 40 mg of IV Lasix. The patient tells me he feels less dyspneic this morning and is nontoxic appearing. In fact, he is requesting to be discharged home. In speaking with the patient's wife, however, she is nervous about his eventual discharge as she feels incapable of adequately caring for him in his weakened/deconditioned state.  Objective: Filed Vitals:   09/05/12 0300 09/05/12 0400 09/05/12 0500 09/05/12 0600  BP: 92/57 113/66 104/54 117/56  Pulse: 96 100 96 97  Temp:  97.6 F (36.4 C)    TempSrc:  Oral    Resp: 23 25 23 25   Height:      Weight:      SpO2: 97% 99% 100% 99%    Intake/Output Summary (Last 24 hours) at 09/05/12 1610 Last data filed at 09/05/12 0600  Gross per 24 hour  Intake   2134 ml  Output   1045 ml  Net   1089 ml    Exam: Gen:  NAD, non-toxic appearing Cardiovascular:  HSIR, tachy Respiratory:  Lungs with a few bibasilar crackles Gastrointestinal:  Abdomen soft, NT/ND, + BS Extremities:  1+ edema, right lower extremity focal area of cellulitis resolving.  Data Reviewed: Basic Metabolic Panel:  Recent Labs Lab  09/01/12 1453 09/02/12 1052 09/02/12 1515 09/02/12 1520 09/03/12 0341 09/04/12 0415 09/05/12 0351  NA 134* 136  --  134* 135 131* 130*  K 3.9 3.6  --  3.6 3.0* 4.2 4.4  CL 102 105  --  98 103 102 99  CO2 21 19*  --  20 19 17* 18*  GLUCOSE 161* 114*  --  126* 47* 44* 252*  BUN 24* 24.2  --  24* 24* 24* 29*  CREATININE 1.3 1.2  --  1.15 1.10 1.04 1.14  CALCIUM 8.1* 7.8*  --  8.2* 7.6* 7.3* 7.2*  MG 1.2*  --   --  1.2*  --   --   --   PHOS  --   --  2.7 2.6  --   --   --    GFR Estimated Creatinine Clearance: 57.8 ml/min (by C-G formula based on Cr of 1.14). Liver Function Tests:  Recent Labs Lab 09/02/12 1052 09/02/12 1520 09/03/12 0341  AST 8 12 12   ALT 6 7 7   ALKPHOS 102 115 102  BILITOT 0.54 0.4 0.3  PROT 5.4* 5.8* 5.1*  ALBUMIN 2.1* 2.4* 2.0*   Coagulation profile  Recent Labs Lab 09/02/12 1520 09/03/12 0341 09/04/12 0912 09/05/12 0351  INR 3.34* 4.35* 5.89* 1.96*    CBC:  Recent Labs Lab 08/31/12 1305  09/02/12 1052 09/02/12 1520 09/03/12 0341 09/04/12 0415 09/05/12 0351  WBC 21.4*  < > 32.7* 31.9* 29.4* 27.1* 18.8*  NEUTROABS 19.0*  --  30.7* 29.8*  --   --   --   HGB 8.9*  < > 8.0* 8.8* 8.3* 8.0* 8.4*  HCT 27.6*  < >  25.2* 26.5* 24.6* 24.1* 25.3*  MCV 80.9  < > 80.0 80.8 79.1 79.5 79.6  PLT 252  < > 242 283 263 287 247  < > = values in this interval not displayed. BNP (last 3 results)  Recent Labs  07/24/12 2140 08/11/12 1346  PROBNP 15551.0* 463.0*   CBG:  Recent Labs Lab 09/04/12 0552 09/04/12 0740 09/04/12 1210 09/04/12 1715 09/04/12 2120  GLUCAP 102* 120* 110* 159* 305*   Hgb A1c  Recent Labs  09/02/12 1520  HGBA1C 6.7*   Microbiology Recent Results (from the past 240 hour(s))  MRSA PCR SCREENING     Status: None   Collection Time    09/02/12 10:21 PM      Result Value Range Status   MRSA by PCR NEGATIVE  NEGATIVE Final   Comment:            The GeneXpert MRSA Assay (FDA     approved for NASAL specimens      only), is one component of a     comprehensive MRSA colonization     surveillance program. It is not     intended to diagnose MRSA     infection nor to guide or     monitor treatment for     MRSA infections.  CLOSTRIDIUM DIFFICILE BY PCR     Status: Abnormal   Collection Time    09/03/12 11:27 PM      Result Value Range Status   C difficile by pcr POSITIVE (*) NEGATIVE Final   Comment: CRITICAL RESULT CALLED TO, READ BACK BY AND VERIFIED WITH:     RICHARDSON,N RN 09/03/12 1410 WOOTEN,K     Procedures and Diagnostic Studies:  Dg Abd 1 View 09/04/2012 IMPRESSION: No evidence of bowel obstruction.  Gas in mildly prominent right and transverse colon.   Original Report Authenticated By: Charlett Nose, M.D.     Dg Chest Port 1 View 09/04/2012 IMPRESSION: Worsening bilateral airspace opacities, edema versus infection.  Stable left upper lobe mass.  Small bilateral effusions.   Original Report Authenticated By: Charlett Nose, M.D.     Scheduled Meds: . aspirin EC  81 mg Oral QODAY  . budesonide-formoterol  2 puff Inhalation BID  . diltiazem  30 mg Oral Q6H  . feeding supplement  237 mL Oral BID BM  . finasteride  5 mg Oral q morning - 10a  . insulin aspart  0-15 Units Subcutaneous TID WC  . insulin detemir  5 Units Subcutaneous QHS  . metoprolol  50 mg Oral BID  . metronidazole  500 mg Intravenous Q8H  . multivitamin with minerals  1 tablet Oral Daily  . nutrition supplement  1 packet Oral BID PC  . sodium bicarbonate  650 mg Oral QID  . sodium chloride  3 mL Intravenous Q12H  . tamsulosin  0.4 mg Oral q morning - 10a  . vancomycin  1,250 mg Intravenous Q24H  . vancomycin  125 mg Oral Q6H   Continuous Infusions: . sodium chloride 75 mL/hr at 09/04/12 1916  . amiodarone (NEXTERONE PREMIX) 360 mg/200 mL dextrose 30 mg/hr (09/04/12 2355)    Time spent: 35 minutes.   LOS: 3 days   Saabir Blyth  [facility] Pager [phone].  If 8PM-8AM, please contact night-coverage at  [email or web address], password [email or web address] 09/05/2012, 7:09 AM

## 2012-09-05 NOTE — Progress Notes (Signed)
This nurse attempts to get patient up to walk around, but patient desat to 78%. Patient c/o being severely short of breath, and agrees to sit in chair. Patient sat in chair with moderate assist from this nurse and pillow placed behind back. Oxygen sats 92-95% on 5 liters of oxygen per nasal cannula. States he feels so much better sitting up. Will continue to monitor

## 2012-09-06 ENCOUNTER — Ambulatory Visit: Payer: Medicare Other | Admitting: Radiation Oncology

## 2012-09-06 LAB — PROTIME-INR: Prothrombin Time: 16.6 seconds — ABNORMAL HIGH (ref 11.6–15.2)

## 2012-09-06 LAB — CBC
MCH: 26.1 pg (ref 26.0–34.0)
MCV: 78.7 fL (ref 78.0–100.0)
Platelets: 282 10*3/uL (ref 150–400)
RBC: 3.29 MIL/uL — ABNORMAL LOW (ref 4.22–5.81)

## 2012-09-06 LAB — BASIC METABOLIC PANEL
CO2: 20 mEq/L (ref 19–32)
Calcium: 7.5 mg/dL — ABNORMAL LOW (ref 8.4–10.5)
Creatinine, Ser: 1.14 mg/dL (ref 0.50–1.35)
Glucose, Bld: 187 mg/dL — ABNORMAL HIGH (ref 70–99)

## 2012-09-06 MED ORDER — FUROSEMIDE 10 MG/ML IJ SOLN
40.0000 mg | Freq: Once | INTRAMUSCULAR | Status: AC
  Administered 2012-09-06: 40 mg via INTRAVENOUS
  Filled 2012-09-06: qty 4

## 2012-09-06 MED ORDER — INSULIN ASPART 100 UNIT/ML ~~LOC~~ SOLN
0.0000 [IU] | Freq: Three times a day (TID) | SUBCUTANEOUS | Status: DC
  Administered 2012-09-06: 09:00:00 via SUBCUTANEOUS
  Administered 2012-09-06: 11 [IU] via SUBCUTANEOUS
  Administered 2012-09-06: 5 [IU] via SUBCUTANEOUS
  Administered 2012-09-07: 2 [IU] via SUBCUTANEOUS
  Administered 2012-09-07: 3 [IU] via SUBCUTANEOUS
  Administered 2012-09-08: 8 [IU] via SUBCUTANEOUS
  Administered 2012-09-08: 5 [IU] via SUBCUTANEOUS

## 2012-09-06 MED ORDER — WARFARIN SODIUM 5 MG PO TABS
5.0000 mg | ORAL_TABLET | Freq: Once | ORAL | Status: AC
  Administered 2012-09-06: 5 mg via ORAL
  Filled 2012-09-06: qty 1

## 2012-09-06 MED ORDER — INSULIN ASPART 100 UNIT/ML ~~LOC~~ SOLN
0.0000 [IU] | Freq: Every day | SUBCUTANEOUS | Status: DC
  Administered 2012-09-08: 4 [IU] via SUBCUTANEOUS

## 2012-09-06 MED ORDER — INSULIN ASPART 100 UNIT/ML ~~LOC~~ SOLN
4.0000 [IU] | Freq: Three times a day (TID) | SUBCUTANEOUS | Status: DC
  Administered 2012-09-07: 08:00:00 via SUBCUTANEOUS

## 2012-09-06 MED ORDER — INSULIN DETEMIR 100 UNIT/ML ~~LOC~~ SOLN
18.0000 [IU] | Freq: Every day | SUBCUTANEOUS | Status: DC
  Administered 2012-09-06: 18 [IU] via SUBCUTANEOUS
  Filled 2012-09-06 (×2): qty 0.18

## 2012-09-06 NOTE — Evaluation (Signed)
Physical Therapy Evaluation Patient Details Name: Brandon Clay MRN: [id number] DOB: 05/08/1935 Today's Date: 09/06/2012 Time: 0865-7846 PT Time Calculation (min): 25 min  PT Assessment / Plan / Recommendation Clinical Impression  78 yo male admitted with generalized weakness, N/V/D, hypoxia. Wife reports pt was independent prior to admission and wearing only 2L O2. On eval, pt required +2 assist for transfers, ambulation distance of 15' with RW. Pt is currently on 6-7L O2. Demonstrates general weakness and poor activity tolerance. Recommend SNF at this time. (May be able to go home with HHPT/24 hour assist if progresses during stay).     PT Assessment  Patient needs continued PT services    Follow Up Recommendations  SNF. (May be able to d/c home with HHPT/24 hour assist if progresses well and wife able to manage)    Does the patient have the potential to tolerate intense rehabilitation      Barriers to Discharge        Equipment Recommendations  Rolling walker with 5" wheels;Wheelchair (measurements PT);Wheelchair cushion (measurements PT)    Recommendations for Other Services OT consult   Frequency Min 3X/week    Precautions / Restrictions Precautions Precautions: Fall Precaution Comments: O2 dependent Restrictions Weight Bearing Restrictions: No   Pertinent Vitals/Pain 94% 6-7L O2 start of session 83%, HR 121 with activity during session 90% 6-7L O2, HR 90-116 end of session (Increased time for recovery/sats to return to 90%)      Mobility  Bed Mobility Bed Mobility: Sit to Supine Sit to Supine: 4: Min assist Transfers Transfers: Sit to Stand;Stand to Sit Sit to Stand: 1: +2 Total assist;From chair/3-in-1 Sit to Stand: Patient Percentage: 60% Stand to Sit: 1: +2 Total assist;To bed Stand to Sit: Patient Percentage: 60% Details for Transfer Assistance: VCS safety, technique, hand placement. Assist to rise, stabilize, control  descnet Ambulation/Gait Ambulation/Gait Assistance: 1: +2 Total assist Ambulation/Gait: Patient Percentage: 60% Ambulation Distance (Feet): 15 Feet (in room around bed) Assistive device: Rolling walker Ambulation/Gait Assistance Details: VCS safety, posture, distance from RW. Fatigues very easily; dyspnea 3-4. Assist to support and stabilize pt as well as maneuver with RW. O2 briefly removed (1-2 minutes) due to cord too short to reach around bed.  Gait Pattern: Step-through pattern;Decreased step length - right;Decreased step length - left;Decreased stride length;Trunk flexed    Exercises     PT Diagnosis: Difficulty walking;Generalized weakness  PT Problem List: Decreased strength;Decreased activity tolerance;Decreased mobility;Decreased knowledge of use of DME;Cardiopulmonary status limiting activity PT Treatment Interventions: DME instruction;Gait training;Functional mobility training;Therapeutic activities;Therapeutic exercise;Patient/family education   PT Goals Acute Rehab PT Goals PT Goal Formulation: With patient/family Time For Goal Achievement: 09/20/12 Potential to Achieve Goals: Good Pt will go Supine/Side to Sit: with supervision PT Goal: Supine/Side to Sit - Progress: Goal set today Pt will go Sit to Stand: with min assist PT Goal: Sit to Stand - Progress: Goal set today Pt will Transfer Bed to Chair/Chair to Bed: with min assist PT Transfer Goal: Bed to Chair/Chair to Bed - Progress: Goal set today Pt will Ambulate: 16 - 50 feet;with min assist;with rolling walker PT Goal: Ambulate - Progress: Goal set today  Visit Information  Last PT Received On: 09/06/12 Assistance Needed: +2    Subjective Data  Subjective: I wanna get in the bed. Ive been in this chair for 3 hours Patient Stated Goal: home   Prior Functioning  Home Living Lives With: Spouse Type of Home: House Home Access: Ramped entrance Entrance Stairs-Number of Steps:  not sure Home Layout: One  level Bathroom Shower/Tub: Health visitor: Standard Home Adaptive Equipment: Walker - rolling Additional Comments: O2.  Prior Function Level of Independence: Independent Vocation: Retired Musician: No difficulties    Copywriter, advertising Overall Cognitive Status: Appears within functional limits for tasks assessed/performed Arousal/Alertness: Awake/alert Orientation Level: Appears intact for tasks assessed Behavior During Session: [facility] for tasks performed    Extremity/Trunk Assessment Right Upper Extremity Assessment RUE ROM/Strength/Tone: [facility] for tasks assessed Left Upper Extremity Assessment LUE ROM/Strength/Tone: WFL for tasks assessed Right Lower Extremity Assessment RLE ROM/Strength/Tone: Deficits RLE ROM/Strength/Tone Deficits: Strength at least 3+/5 with functional activities Left Lower Extremity Assessment LLE ROM/Strength/Tone: Deficits LLE ROM/Strength/Tone Deficits: Strength at least 3+/5 with functional activities   Balance    End of Session PT - End of Session Activity Tolerance: Patient limited by fatigue (Limited by SOB with activity) Patient left: in bed;with call bell/phone within reach;with family/visitor present  GP     Rebeca Alert, MPT Pager: [phone]

## 2012-09-06 NOTE — Progress Notes (Signed)
Pt feels OK at rest - not short of breath.   Filed Vitals:   09/06/12 0000 09/06/12 0250 09/06/12 0400 09/06/12 0500  BP:  112/48    Pulse:  112    Temp: 98.9 F (37.2 C)  98.7 F (37.1 C)   TempSrc: Axillary     Resp:  31    Height:      Weight:    185 lb 3 oz (84 kg)  SpO2:  89%      Intake/Output Summary (Last 24 hours) at 09/06/12 0656 Last data filed at 09/06/12 0500  Gross per 24 hour  Intake 1600.58 ml  Output   1025 ml  Net 575.58 ml    SUBJECTIVE No chest pain or dyspnea.  Heart rate improved today after addition of oral diltiazem yesterday. LABS: Basic Metabolic Panel:  Recent Labs  30/86/57 0415 09/05/12 0351  NA 131* 130*  K 4.2 4.4  CL 102 99  CO2 17* 18*  GLUCOSE 44* 252*  BUN 24* 29*  CREATININE 1.04 1.14  CALCIUM 7.3* 7.2*   Liver Function Tests: No results found for this basename: AST, ALT, ALKPHOS, BILITOT, PROT, ALBUMIN,  in the last 72 hours No results found for this basename: LIPASE, AMYLASE,  in the last 72 hours CBC:  Recent Labs  09/04/12 0415 09/05/12 0351  WBC 27.1* 18.8*  HGB 8.0* 8.4*  HCT 24.1* 25.3*  MCV 79.5 79.6  PLT 287 247   Hemoglobin A1C: No results found for this basename: HGBA1C,  in the last 72 hours Fasting Lipid Panel: No results found for this basename: CHOL, HDL, LDLCALC, TRIG, CHOLHDL, LDLDIRECT,  in the last 72 hours Thyroid Function Tests: No results found for this basename: TSH, T4TOTAL, FREET3, T3FREE, THYROIDAB,  in the last 72 hours Radiology/Studies:  Dg Chest Port 1 View  09/03/2012  *RADIOLOGY REPORT*  Clinical Data: Pulmonary edema.  History of lung cancer.  PORTABLE CHEST - 1 VIEW  Comparison: PA and lateral chest 07/24/2012.  Findings: Left upper lobe mass is again seen measuring approximately 4.4 cm cranial-caudal, not markedly changed.  The lungs are emphysematous.  Bilateral alveolar and interstitial opacities have worsened.  There is cardiomegaly.  No pneumothorax or pleural fluid.   IMPRESSION:  1.  Bilateral alveolar and interstitial opacities superimposed on chronic change are likely due to pulmonary edema in this patient with cardiomegaly. Multi focal infection or interstitial tumor spread are felt less likely. 2.  Left upper lobe mass.   Original Report Authenticated By: Holley Dexter, M.D.     PHYSICAL EXAM BP 112/48  Pulse 112  Temp(Src) 98.7 F (37.1 C) (Axillary)  Resp 31  Ht 5\' 10"  (1.778 m)  Wt 185 lb 3 oz (84 kg)  BMI 26.57 kg/m2  SpO2 89%  General: Elderly, chronically ill appearing, in no acute distress. Head: Normal Neck: Negative for carotid bruits. JVD not elevated. Lungs: Clear bilaterally. Heart: IRRR S1 S2 without murmurs, rubs,  Abdomen: Soft, non-tender, non-distended with normoactive bowel sounds. No hepatomegaly. No rebound/guarding. No obvious abdominal masses. Extremities: traceedema.  Distal pedal pulses are 2+ and equal bilaterally. Neuro: nonfocal  TELEMETRY: Telemetry shows atrial fib with controlled ventricular response.  O2 sats - 89% at rest.   ASSESSMENT AND PLAN: 1. Atrial fibrillation with RVR.  On coumadin with target INR 2-3. TSH is normal.  Continue with metoprolol and diltiazem.  2. CAD s/p BMS 07/12/12. Continue ASA 81 mg daily. 3. C. Diff colitis. 4. RLE cellulitis. 5. Lung  CA. Poor candidate for resection. 6. DM 7. COPD.  - he is still hypoxic at rest.  PCCM to address.   Principal Problem:   Generalized weakness with diarrhea, vomiting, and abdominal pain in the setting of a recent history of Clostridium difficile colitis Active Problems:   Hypertensive heart disease   Type 2 diabetes mellitus with vascular disease   Benign prostatic hypertrophy   Squamous cell lung cancer left upper lobe   COPD (chronic obstructive pulmonary disease)   CAD (coronary artery disease), native coronary artery   Anemia of chronic disease   Leukocytosis   Rapid atrial fibrillation   Cellulitis of right lower extremity    Hypokalemia   Metabolic acidosis   Hypotension, unspecified    Signed, Elyn Aquas. MD,FACC 09/06/2012 6:56 AM

## 2012-09-06 NOTE — Progress Notes (Signed)
ANTICOAGULATION CONSULT NOTE - Follow Up Consult  Pharmacy Consult for Coumadin Indication: atrial fibrillation  No Known Allergies   Labs:  Recent Labs  09/04/12 0415 09/04/12 0912 09/05/12 0351 09/06/12 0343 09/06/12 0756  HGB 8.0*  --  8.4*  --  8.6*  HCT 24.1*  --  25.3*  --  25.9*  PLT 287  --  247  --  282  LABPROT  --  48.7* 21.6* 16.6*  --   INR  --  5.89* 1.96* 1.38  --   CREATININE 1.04  --  1.14  --  1.14    Estimated Creatinine Clearance: 57.8 ml/min (by C-G formula based on Cr of 1.14).   Assessment: 77 yom on chronic Coumadin 4mg /day for h/o atrial fibrillation. Coumadin held PTA given supratherapeutic INR.  INR on admit = 3.34 and increased to 5.89 despite holding Coumadin. Pt received Vitamin K 0.5 mg PO x 1 on 3/29.  INR tx 3/30 and Coumadin resumed.   INR 1.38 this AM.  Hgb low but stable, no bleeding.  Pt was started on IV amiodarone 3/27 but this was stopped 3/30 with no plans for further PO amiodarone*   Goal of Therapy:  INR 2-3 Monitor platelets by anticoagulation protocol: Yes   Plan:   Coumadin 5mg  po x 1 boosted dose tonight given subtherapeutic INR and recent vitamin K  Daily PT/INR  Pharmacy will f/u  Geoffry Paradise, PharmD, BCPS Pager: [phone] 9:21 AM Pharmacy #: [id number]

## 2012-09-06 NOTE — Evaluation (Signed)
Occupational Therapy Evaluation Patient Details Name: Brandon Clay MRN: [id number] DOB: 11-20-1934 Today's Date: 09/06/2012 Time: 4403-4742 OT Time Calculation (min): 25 min  OT Assessment / Plan / Recommendation Clinical Impression  Pt presents to OT with decreased I with ADL activity s.p  admission with diarrhea. Pt will benefit from  skilled OT to increase I with ADL activity and return to PLOF    OT Assessment  Patient needs continued OT Services    Follow Up Recommendations  Home health OT;Supervision/Assistance - 24 hour;SNF;Other (comment) (depending on care at home)       Equipment Recommendations  Other (comment) (TBD)       Frequency  Min 2X/week    Precautions / Restrictions Precautions Precautions: Fall       ADL  Grooming: Simulated;Wash/dry face Where Assessed - Grooming: Unsupported sitting Upper Body Bathing: Simulated;Minimal assistance Where Assessed - Upper Body Bathing: Unsupported sitting Lower Body Bathing: Simulated;Maximal assistance Where Assessed - Lower Body Bathing: Supported sit to stand Upper Body Dressing: Simulated;Minimal assistance Where Assessed - Upper Body Dressing: Unsupported sitting Lower Body Dressing: Maximal assistance Where Assessed - Lower Body Dressing: Supported sit to Pharmacist, hospital: Simulated;Other (comment) (bed to chair) Toilet Transfer Method: Sit to stand;Stand pivot Toileting - Clothing Manipulation and Hygiene: +2 Total assistance Toileting - Clothing Manipulation and Hygiene: Patient Percentage: 60%    OT Diagnosis: Generalized weakness  OT Problem List: Decreased strength;Decreased safety awareness;Decreased activity tolerance OT Treatment Interventions: Self-care/ADL training;Patient/family education;DME and/or AE instruction   OT Goals Acute Rehab OT Goals Time For Goal Achievement: 09/06/12 ADL Goals Pt Will Perform Grooming: with supervision;Standing at sink ADL Goal: Grooming - Progress: Goal  set today Pt Will Transfer to Toilet: with supervision;Comfort height toilet ADL Goal: Toilet Transfer - Progress: Goal set today Pt Will Perform Toileting - Clothing Manipulation: with supervision;Standing  Visit Information  Last OT Received On: 09/06/12 Assistance Needed: +2    Subjective Data  Subjective: I do wanna get in that chair- but i have chemo at 115   Prior Functioning     Home Living Lives With: Spouse Type of Home: House Home Access: Stairs to enter [employer] of Steps: not sure Home Layout: One level Bathroom Shower/Tub: Health visitor: Standard Prior Function Level of Independence: Independent Vocation: Retired Musician: No difficulties         Vision/Perception Vision - History Patient Visual Report: No change from baseline   Cognition  Cognition Overall Cognitive Status: Appears within functional limits for tasks assessed/performed Arousal/Alertness: Awake/alert Orientation Level: Appears intact for tasks assessed Behavior During Session: [facility] for tasks performed    Extremity/Trunk Assessment Right Upper Extremity Assessment RUE ROM/Strength/Tone: [facility] for tasks assessed Left Upper Extremity Assessment LUE ROM/Strength/Tone: WFL for tasks assessed     Mobility Bed Mobility Bed Mobility: Supine to Sit Supine to Sit: 3: Mod assist;HOB elevated Transfers Transfers: Sit to Stand;Stand to Sit Sit to Stand: 1: +2 Total assist Sit to Stand: Patient Percentage: 60% Stand to Sit: 1: +2 Total assist;With upper extremity assist Stand to Sit: Patient Percentage: 60%           End of Session OT - End of Session Activity Tolerance: Patient tolerated treatment well Patient left: in chair;with call bell/phone within reach Nurse Communication: Mobility status  GO     Alba Cory 09/06/2012, 12:07 PM

## 2012-09-06 NOTE — Clinical Documentation Improvement (Signed)
RESPIRATORY FAILURE DOCUMENTATION CLARIFICATION QUERY   THIS DOCUMENT IS NOT A PERMANENT PART OF THE MEDICAL RECORD  TO RESPOND TO THE THIS QUERY, FOLLOW THE INSTRUCTIONS BELOW:  1. If needed, update documentation for the patient's encounter via the notes activity.  2. Access this query again and click edit on the In [employer].  3. After updating, or not, click F2 to complete all highlighted (required) fields concerning your review. Select "additional documentation in the medical record" OR "no additional documentation provided".  4. Click Sign note button.  5. The deficiency will fall out of your In Basket *Please let us know if you are not able to complete this workflow by phone or e-mail (listed below).  Please update your documentation within the medical record to reflect your response to this query.                                                                                    09/06/12  Dear Dr. Salena [facility],  In a better effort to capture your patient's severity of illness, reflect appropriate length of stay and utilization of resources, a review of the patient medical record has revealed the following indicators.    Based on your clinical judgment, please clarify and document in a progress note and/or discharge summary the clinical condition associated with the following supporting information:  In responding to this query please exercise your independent judgment.  The fact that a query is asked, does not imply that any particular answer is desired or expected. 09/06/12 note per Progress note.Marland KitchenMarland Kitchen"Dyspnea / hypoxia". For accurate Dx speficity & severity can noted S&S be linked to clinical cond being eval'd, mon'd & tx'd. Thank you.  Possible Clinical Conditions? Acute Respiratory Failure Acute on Chronic Respiratory Failure Chronic Respiratory Failure  Acute Pulmonary Edema Pulmonary Edema  Acute Respiratory Insufficiency Acute Respiratory Insufficiency  following surgery or trauma Other Condition Cannot Clinically Determine   Supporting Information: Risk Factors: 09/06/12 progr note.Marland KitchenMarland Kitchen"Patient became dyspneic 09/04/2012 overnight. A chest x-ray was done which showed questionable infiltrates consistent with edema versus infection. Given 40 mg of Lasix IV with improvement.  Repeat lasix today.-Pro BNP markedly elevated at 25,958."  Signs&Symptoms: 09/06/12 progr note..."appear to have some labored breathing with increased use of accessory muscles despite this..."   Diagnostics: ABG's No ABG's recored per noted 09/06/12; O2 sat 89% on 6L [place] Lab: 09/06/12 BNP as noted above  Radiology: 09/06/12 as noted above  Treatment: See above note  You may use possible, probable, or suspect with inpatient documentation. possible, probable, suspected diagnoses MUST be documented at the time of discharge  Reviewed: additional documentation in the medical record  Thank You,  Toribio Harbour, RN, BSN, CCDS Certified Clinical Documentation Specialist Pager: [phone]  Health Information Management [place]

## 2012-09-06 NOTE — Progress Notes (Addendum)
[facility] PROGRESS NOTE  Brandon Clay ZOX:[id number] DOB: 09/23/1934 DOA: 09/02/2012 PCP: [name], MD  Brief narrative: Brandon Clay is an 78 y.o. 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 difficile colitis requiring PO vancomycin, atrial fibrillation on chronic Coumadin, 3 vessel CAD with chronic occlusion of RCA, severe stenosis in proximal LCx & moderately severe disease in mid LAD, S/P PTCA, on ASA and Brilinta (Plavix nonresponder), recent admission 07/24/12-08/02/12 for treatment of C. Diff who was sent to the hospital from radiation for evaluation of generalized weakness, vomiting and diarrhea. Patient was receiving stereotactic body radiotherapy (#4 / 5 planned treatments) and was noticed to be very weak and dizzy upon standing up.  On admission, he was was noted to be in atrial fibrillation with heart rates in 150s. He also had significant leukocytosis of 32.7. He is currently being treated for recurrent Clostridium difficile infection as well as right lower extremity cellulitis, and over the course of his hospital stay, has shown gradual improvement in his clinical status.  Assessment/Plan: Principal Problem:  Generalized weakness in the setting of nausea, vomiting, diarrhea, dehydration / Recurrent Clostridium difficile colitis -Likely from recurrent C. difficile colitis.  C. difficile PCR positive. N/V resolved. Diarrhea improved. -Replace electrolytes as needed.  -Continue antiemetics as needed. -Physical and occupational therapy evaluations requested however patient continues to refuse participation. Active Problems: Acute respiratory failure with dyspnea / hypoxia secondary to acute diastolic CHF and volume overload  -Patient became dyspneic 09/04/2012 overnight. A chest x-ray was done which showed questionable infiltrates consistent with edema versus infection. Given 40 mg of Lasix IV with improvement.  Repeat lasix  today. -Pro BNP markedly elevated at 25,958.   Supratherapeutic INR -Given 0.5 mg of vitamin K 09/04/2012 (INR 5.89--->1.96.) -Coumadin dosing per pharmacy. Metabolic acidosis -Likely from bicarbonate losses in the stool. Continue oral replacement. -Lactic acid normal at 1.2. Right lower extremity cellulitis -Small fluctuant area noted RLE.  -Continue IV vancomycin, area improving with no current indication for debridement. Atrial fibrillation with RVR  -Known history of atrial fibrillation, on Coumadin. Goal INR 2-3. -Initially treated with Cardizem drip and metoprolol. Cardizem drip discontinued secondary to low blood pressure and given digoxin overnight on 09/02/2012.  Now off Amiodarone drip. -Heart rate improved 90s-low 100s. Leukocytosis  -Most likely from infection (recurrent C. difficile colitis versus cellulitis).  -Continue Flagyl and oral/IV vancomycin. -White blood cell count beginning to drop. H/O Hypertension / Hypotension  -Blood pressure currently well-controlled (and on low side). Hypercholesterolemia  -History of statin intolerance. 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 CBGs 234-305. -Metformin currently on hold.  Increase Levemir to 18 units at bedtime and continue sliding scale insulin before meals and bedtime, moderate scale, and add 4 units of NovoLog before every meal. 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 Trina Ao care, felt to be too weak to proceed with lung resection.  -For final radiation treatment 09/06/12. COPD (chronic obstructive pulmonary disease)  -Continue Symbicort.  No wheeze.  Increased WOB likely from fluid overload. CAD (coronary artery disease), native coronary artery  -On outpatient Brilinta and aspirin.  -Now off Brilinta per cardiology recommendations. Anemia of chronic disease  -Secondary to history multiple medical problems including squamous  cell lung cancer.  -No signs of active bleed. Hemoglobin stable with no indication for transfusion. Benign prostatic hypertrophy  -Continue Finasteride and Flomax.  Code Status: Full  Family Communication: Wife updated at bedside 09/03/12, no one at bedside. Called and updated her by telephone 09/05/12. Disposition Plan: Home versus SNF depending on progress when stable.   Medical Consultants:  Dr. Olga Millers, Cardiology.  Other Consultants:  Physical therapy: Has refused participation thus far, encouraged to work with the physical therapists.  Anti-infectives:  Vancomycin 09/02/2012--->  Flagyl 09/02/2012---->  IV Vancomycin 09/03/12--->  HPI/Subjective: Brandon Clay denies dyspnea. He does appear to have some labored breathing with increased use of accessory muscles despite this. Appetite is fair. No nausea or vomiting. No bowel movements so far today. Patient is a bit irritable and tells me that he wants to go home and if I don't discharge him by tomorrow, he is leaving anyway.  Objective: Filed Vitals:   09/06/12 0250 09/06/12 0400 09/06/12 0500 09/06/12 0830  BP: 112/48     Pulse: 112     Temp:  98.7 F (37.1 C)    TempSrc:      Resp: 31     Height:      Weight:   84 kg (185 lb 3 oz)   SpO2: 89%   95%    Intake/Output Summary (Last 24 hours) at 09/06/12 0951 Last data filed at 09/06/12 7253  Gross per 24 hour  Intake 1652.06 ml  Output   1025 ml  Net 627.06 ml    Exam: Gen:  NAD, non-toxic appearing Cardiovascular:  HSIR, tachy Respiratory:  Lungs mostly clear Gastrointestinal:  Abdomen soft, NT/ND, + BS Extremities:  1+ edema, right lower extremity focal area of cellulitis resolving.  Data Reviewed: Basic Metabolic Panel:  Recent Labs Lab 09/01/12 1453  09/02/12 1515 09/02/12 1520 09/03/12 0341 09/04/12 0415 09/05/12 0351 09/06/12 0756  NA 134*  < >  --  134* 135 131* 130* 131*  K 3.9  < >  --  3.6 3.0* 4.2 4.4 4.3  CL 102  < >  --  98  103 102 99 100  CO2 21  < >  --  20 19 17* 18* 20  GLUCOSE 161*  < >  --  126* 47* 44* 252* 187*  BUN 24*  < >  --  24* 24* 24* 29* 40*  CREATININE 1.3  < >  --  1.15 1.10 1.04 1.14 1.14  CALCIUM 8.1*  < >  --  8.2* 7.6* 7.3* 7.2* 7.5*  MG 1.2*  --   --  1.2*  --   --   --   --   PHOS  --   --  2.7 2.6  --   --   --   --   < > = values in this interval not displayed. GFR Estimated Creatinine Clearance: 57.8 ml/min (by C-G formula based on Cr of 1.14). Liver Function Tests:  Recent Labs Lab 09/02/12 1052 09/02/12 1520 09/03/12 0341  AST 8 12 12   ALT 6 7 7   ALKPHOS 102 115 102  BILITOT 0.54 0.4 0.3  PROT 5.4* 5.8* 5.1*  ALBUMIN 2.1* 2.4* 2.0*   Coagulation profile  Recent Labs Lab 09/02/12 1520 09/03/12 0341 09/04/12 0912 09/05/12 0351 09/06/12 0343  INR 3.34* 4.35* 5.89* 1.96* 1.38    CBC:  Recent Labs Lab 08/31/12 1305  09/02/12 1052 09/02/12 1520 09/03/12 0341 09/04/12 0415 09/05/12 0351 09/06/12 0756  WBC 21.4*  < > 32.7* 31.9* 29.4* 27.1* 18.8* 17.5*  NEUTROABS 19.0*  --  30.7* 29.8*  --   --   --   --  HGB 8.9*  < > 8.0* 8.8* 8.3* 8.0* 8.4* 8.6*  HCT 27.6*  < > 25.2* 26.5* 24.6* 24.1* 25.3* 25.9*  MCV 80.9  < > 80.0 80.8 79.1 79.5 79.6 78.7  PLT 252  < > 242 283 263 287 247 282  < > = values in this interval not displayed. BNP (last 3 results)  Recent Labs  07/24/12 2140 08/11/12 1346 09/05/12 0731  PROBNP 15551.0* 463.0* 25958.0*   CBG:  Recent Labs Lab 09/05/12 0753 09/05/12 1208 09/05/12 1631 09/05/12 2126 09/06/12 0820  GLUCAP 234* 273* 332* 277* 179*   Hgb A1c No results found for this basename: HGBA1C,  in the last 72 hours Microbiology Recent Results (from the past 240 hour(s))  MRSA PCR SCREENING     Status: None   Collection Time    09/02/12 10:21 PM      Result Value Range Status   MRSA by PCR NEGATIVE  NEGATIVE Final   Comment:            The GeneXpert MRSA Assay (FDA     approved for NASAL specimens     only), is  one component of a     comprehensive MRSA colonization     surveillance program. It is not     intended to diagnose MRSA     infection nor to guide or     monitor treatment for     MRSA infections.  CLOSTRIDIUM DIFFICILE BY PCR     Status: Abnormal   Collection Time    09/03/12 11:27 PM      Result Value Range Status   C difficile by pcr POSITIVE (*) NEGATIVE Final   Comment: CRITICAL RESULT CALLED TO, READ BACK BY AND VERIFIED WITH:     RICHARDSON,N RN 09/03/12 1410 WOOTEN,K     Procedures and Diagnostic Studies:  Dg Abd 1 View 09/04/2012 IMPRESSION: No evidence of bowel obstruction.  Gas in mildly prominent right and transverse colon.   Original Report Authenticated By: Charlett Nose, M.D.     Dg Chest Port 1 View 09/04/2012 IMPRESSION: Worsening bilateral airspace opacities, edema versus infection.  Stable left upper lobe mass.  Small bilateral effusions.   Original Report Authenticated By: Charlett Nose, M.D.     Scheduled Meds: . aspirin EC  81 mg Oral QODAY  . budesonide-formoterol  2 puff Inhalation BID  . diltiazem  120 mg Oral Daily  . feeding supplement  237 mL Oral BID BM  . finasteride  5 mg Oral q morning - 10a  . furosemide  40 mg Intravenous Once  . insulin aspart  0-15 Units Subcutaneous TID WC  . insulin aspart  0-5 Units Subcutaneous QHS  . insulin aspart  4 Units Subcutaneous TID WC  . insulin detemir  18 Units Subcutaneous QHS  . metoprolol  50 mg Oral BID  . metronidazole  500 mg Intravenous Q8H  . multivitamin with minerals  1 tablet Oral Daily  . nutrition supplement  1 packet Oral BID PC  . sodium bicarbonate  650 mg Oral QID  . sodium chloride  3 mL Intravenous Q12H  . tamsulosin  0.4 mg Oral q morning - 10a  . vancomycin  1,500 mg Intravenous Q24H  . vancomycin  125 mg Oral Q6H  . warfarin  5 mg Oral ONCE-1800  . Warfarin - Pharmacist Dosing Inpatient   Does not apply q1800   Continuous Infusions: . sodium chloride 10 mL/hr at 09/05/12 1900     Time  spent: 25 minutes.   LOS: 4 days   RAMA,CHRISTINA  [facility] Pager [phone].  If 8PM-8AM, please contact night-coverage at [email or web address], password [facility] 09/06/2012, 9:51 AM

## 2012-09-06 NOTE — Progress Notes (Signed)
[id number]/Rhonda Davis, RN, BSN, CCM:  CHART REVIEWED AND UPDATED.  Next chart review due on [id number]. NO DISCHARGE NEEDS PRESENT AT THIS TIME. CASE MANAGEMENT [phone] 

## 2012-09-07 ENCOUNTER — Ambulatory Visit
Admission: RE | Admit: 2012-09-07 | Discharge: 2012-09-07 | Disposition: A | Discharge status: Home / Self Care | Payer: Medicare Other | Source: Ambulatory Visit | Attending: Radiation Oncology | Admitting: Radiation Oncology

## 2012-09-07 ENCOUNTER — Ambulatory Visit
Admit: 2012-09-07 | Discharge: 2012-09-07 | Disposition: A | Discharge status: Home / Self Care | Payer: Medicare Other | Attending: Radiation Oncology | Admitting: Radiation Oncology

## 2012-09-07 ENCOUNTER — Inpatient Hospital Stay (HOSPITAL_COMMUNITY): Payer: Medicare Other

## 2012-09-07 DIAGNOSIS — J9600 Acute respiratory failure, unspecified whether with hypoxia or hypercapnia: Secondary | ICD-10-CM | POA: Diagnosis present

## 2012-09-07 DIAGNOSIS — C3492 Malignant neoplasm of unspecified part of left bronchus or lung: Secondary | ICD-10-CM

## 2012-09-07 DIAGNOSIS — I5031 Acute diastolic (congestive) heart failure: Secondary | ICD-10-CM | POA: Diagnosis present

## 2012-09-07 DIAGNOSIS — J700 Acute pulmonary manifestations due to radiation: Secondary | ICD-10-CM

## 2012-09-07 DIAGNOSIS — J9620 Acute and chronic respiratory failure, unspecified whether with hypoxia or hypercapnia: Secondary | ICD-10-CM

## 2012-09-07 DIAGNOSIS — E8770 Fluid overload, unspecified: Secondary | ICD-10-CM | POA: Diagnosis not present

## 2012-09-07 LAB — BLOOD GAS, ARTERIAL
Acid-Base Excess: 1.3 mmol/L (ref 0.0–2.0)
Drawn by: 31297
O2 Content: 6 L/min
O2 Saturation: 87.2 %
TCO2: 21.3 mmol/L (ref 0–100)
pO2, Arterial: 51 mmHg — ABNORMAL LOW (ref 80.0–100.0)

## 2012-09-07 LAB — CBC
HCT: 21.7 % — ABNORMAL LOW (ref 39.0–52.0)
MCHC: 33.6 g/dL (ref 30.0–36.0)
RDW: 17.4 % — ABNORMAL HIGH (ref 11.5–15.5)

## 2012-09-07 LAB — BASIC METABOLIC PANEL
BUN: 45 mg/dL — ABNORMAL HIGH (ref 6–23)
Calcium: 7.5 mg/dL — ABNORMAL LOW (ref 8.4–10.5)
GFR calc Af Amer: 71 mL/min — ABNORMAL LOW (ref >90)
GFR calc non Af Amer: 62 mL/min — ABNORMAL LOW (ref >90)
Potassium: 4 mEq/L (ref 3.5–5.1)
Sodium: 134 mEq/L — ABNORMAL LOW (ref 135–145)

## 2012-09-07 LAB — URINALYSIS, ROUTINE W REFLEX MICROSCOPIC
Glucose, UA: NEGATIVE mg/dL
Protein, ur: NEGATIVE mg/dL
Specific Gravity, Urine: 1.021 (ref 1.005–1.030)
pH: 5 (ref 5.0–8.0)

## 2012-09-07 LAB — URINE MICROSCOPIC-ADD ON

## 2012-09-07 LAB — PROTIME-INR
INR: 1.81 — ABNORMAL HIGH (ref 0.00–1.49)
Prothrombin Time: 20.3 seconds — ABNORMAL HIGH (ref 11.6–15.2)

## 2012-09-07 MED ORDER — SODIUM BICARBONATE 650 MG PO TABS
650.0000 mg | ORAL_TABLET | Freq: Two times a day (BID) | ORAL | Status: DC
Start: 2012-09-07 — End: 2012-09-08
  Administered 2012-09-07: 650 mg via ORAL
  Filled 2012-09-07 (×3): qty 1

## 2012-09-07 MED ORDER — INSULIN DETEMIR 100 UNIT/ML ~~LOC~~ SOLN
25.0000 [IU] | Freq: Every day | SUBCUTANEOUS | Status: DC
  Administered 2012-09-07: 25 [IU] via SUBCUTANEOUS
  Filled 2012-09-07: qty 0.25

## 2012-09-07 MED ORDER — FUROSEMIDE 10 MG/ML IJ SOLN: 40.0000 mg | Freq: Once | INTRAMUSCULAR | Status: DC

## 2012-09-07 MED ORDER — INSULIN ASPART 100 UNIT/ML ~~LOC~~ SOLN: 6.0000 [IU] | Freq: Three times a day (TID) | SUBCUTANEOUS | Status: DC

## 2012-09-07 MED ORDER — FUROSEMIDE 10 MG/ML IJ SOLN
40.0000 mg | Freq: Once | INTRAMUSCULAR | Status: AC
  Administered 2012-09-07: 40 mg via INTRAVENOUS
  Filled 2012-09-07: qty 4

## 2012-09-07 MED ORDER — METHYLPREDNISOLONE SODIUM SUCC 125 MG IJ SOLR
80.0000 mg | Freq: Two times a day (BID) | INTRAMUSCULAR | Status: DC
  Filled 2012-09-07: qty 1.28

## 2012-09-07 MED ORDER — VANCOMYCIN HCL IN DEXTROSE 750-5 MG/150ML-% IV SOLN
750.0000 mg | Freq: Two times a day (BID) | INTRAVENOUS | Status: DC
  Administered 2012-09-08 – 2012-09-09 (×4): 750 mg via INTRAVENOUS
  Filled 2012-09-07 (×5): qty 150

## 2012-09-07 MED ORDER — LEVOFLOXACIN IN D5W 500 MG/100ML IV SOLN
500.0000 mg | INTRAVENOUS | Status: DC
  Administered 2012-09-07 – 2012-09-09 (×3): 500 mg via INTRAVENOUS
  Filled 2012-09-07 (×3): qty 100

## 2012-09-07 MED ORDER — WARFARIN SODIUM 2 MG PO TABS
2.0000 mg | ORAL_TABLET | Freq: Once | ORAL | Status: AC
  Filled 2012-09-07: qty 1

## 2012-09-07 MED ORDER — DEXTROSE 5 % IV SOLN
1.0000 g | Freq: Two times a day (BID) | INTRAVENOUS | Status: DC
  Administered 2012-09-07 – 2012-09-10 (×6): 1 g via INTRAVENOUS
  Filled 2012-09-07 (×6): qty 1

## 2012-09-07 MED ORDER — METHYLPREDNISOLONE SODIUM SUCC 125 MG IJ SOLR
80.0000 mg | Freq: Three times a day (TID) | INTRAMUSCULAR | Status: DC
  Administered 2012-09-08 (×2): 80 mg via INTRAVENOUS
  Administered 2012-09-08: 06:00:00 via INTRAVENOUS
  Administered 2012-09-08 – 2012-09-10 (×5): 80 mg via INTRAVENOUS
  Filled 2012-09-07 (×11): qty 1.28

## 2012-09-07 NOTE — Progress Notes (Signed)
ANTIBIOTIC CONSULT NOTE - Follow Up  Pharmacy Consult for Cefepime & Vancomycin IV Indication: Cellulitis/Pneumonia  No Known Allergies  Patient Measurements: Height: 5\' 10"  (177.8 cm) Weight: 186 lb 4.6 oz (84.5 kg) IBW/kg (Calculated) : 73   Vital Signs: Temp: 98.4 F (36.9 C) (04/01 1345) Temp src: Oral (04/01 1345) BP: 98/63 mmHg (04/01 1345) Pulse Rate: 102 (04/01 1345) Intake/Output from previous day: 03/31 0701 - 04/01 0700 In: 1263 [P.O.:720; I.V.:243; IV Piggyback:300] Out: 1295 [Urine:1295] Intake/Output from this shift: Total I/O In: 520 [P.O.:440; I.V.:80] Out: 1000 [Urine:1000]  Labs:  Recent Labs  09/05/12 0351 09/06/12 0756 09/07/12 0500  WBC 18.8* 17.5* 11.9*  HGB 8.4* 8.6* 7.3*  PLT 247 282 255  CREATININE 1.14 1.14 1.13   Estimated Creatinine Clearance: 58.3 ml/min (by C-G formula based on Cr of 1.13).  Recent Labs  09/05/12 1538  VANCOTROUGH 7.1*     Microbiology: Recent Results (from the past 720 hour(s))  MRSA PCR SCREENING     Status: None   Collection Time    09/02/12 10:21 PM      Result Value Range Status   MRSA by PCR NEGATIVE  NEGATIVE Final   Comment:            The GeneXpert MRSA Assay (FDA     approved for NASAL specimens     only), is one component of a     comprehensive MRSA colonization     surveillance program. It is not     intended to diagnose MRSA     infection nor to guide or     monitor treatment for     MRSA infections.  CLOSTRIDIUM DIFFICILE BY PCR     Status: Abnormal   Collection Time    09/03/12 11:27 PM      Result Value Range Status   C difficile by pcr POSITIVE (*) NEGATIVE Final   Comment: CRITICAL RESULT CALLED TO, READ BACK BY AND VERIFIED WITH:     RICHARDSON,N RN 09/03/12 1410 WOOTEN,K    Medical History: Past Medical History  Diagnosis Date  . Hypertension   . Hypercholesterolemia     statin intolerant  . Diabetes mellitus, type 2   . Benign prostatic hypertrophy   . Emphysema   .  Lung cancer     Followed by Dr. [name] (LUL squamous cell carcinoma)  . COPD (chronic obstructive pulmonary disease)   . CKD (chronic kidney disease)   . Colitis   . Coronary artery disease     Cath 07/02/12 3v CAD w/ chronic occlusion of RCA (fills from left collaterals), severe stenosis prox LCx & moderately severe dz in mid LAD; s/p PTCA/BMS to prox LCx 07/12/12   . Normocytic anemia   . Persistent atrial fibrillation   . Atrial enlargement, left     Medications:  Scheduled:  . aspirin EC  81 mg Oral QODAY  . budesonide-formoterol  2 puff Inhalation BID  . diltiazem  120 mg Oral Daily  . feeding supplement  237 mL Oral BID BM  . finasteride  5 mg Oral q morning - 10a  . [COMPLETED] furosemide  40 mg Intravenous Once  . furosemide  40 mg Intravenous Once  . furosemide  40 mg Intravenous Once  . insulin aspart  0-15 Units Subcutaneous TID WC  . insulin aspart  0-5 Units Subcutaneous QHS  . insulin aspart  6 Units Subcutaneous TID WC  . insulin detemir  25 Units Subcutaneous QHS  . levofloxacin (LEVAQUIN)  IV  500 mg Intravenous Q24H  . metoprolol  50 mg Oral BID  . metronidazole  500 mg Intravenous Q8H  . multivitamin with minerals  1 tablet Oral Daily  . nutrition supplement  1 packet Oral BID PC  . sodium bicarbonate  650 mg Oral BID  . sodium chloride  3 mL Intravenous Q12H  . tamsulosin  0.4 mg Oral q morning - 10a  . vancomycin  1,500 mg Intravenous Q24H  . vancomycin  125 mg Oral Q6H  . warfarin  2 mg Oral ONCE-1800  . [COMPLETED] warfarin  5 mg Oral ONCE-1800  . Warfarin - Pharmacist Dosing Inpatient   Does not apply q1800  . [DISCONTINUED] insulin aspart  4 Units Subcutaneous TID WC  . [DISCONTINUED] insulin detemir  18 Units Subcutaneous QHS  . [DISCONTINUED] sodium bicarbonate  650 mg Oral QID   Infusions:  . sodium chloride 10 mL/hr at 09/05/12 1900   PRN: acetaminophen, acetaminophen, albuterol, HYDROcodone-acetaminophen, ondansetron (ZOFRAN) IV,  ondansetron Assessment: 77 YOM admitted 3/27 w/ weakness and NVD. PMH: recent Cdiff colitis, Afib, CAD, squamous cell lung cancer.   D# 6 Vancomycin IV 1500mg  q24h for R LE cellulitis  Scr 1.13, CrCl 58 ml/min (63ml/min/1.73m2)  WBC still elevated but trending down (32.7 --> 18.8 -->11.9)  Patient found to be experiencing dyspnea this afternoon.  Stat CXR ordered which showed suspected bilateral PNA vs pulmonary edema  Vancomycin and Cefepime per pharmacy ordered for Pneumonia.  MD has also ordered Levaquin 500mg  IV q24h.  Patient also on IV Flagyl + PO Vanc for CDiff.  Next dose of current Vancomycin regimen due @ 18:00 today.  Will change dosing regimen with next dose to reflect a higher trough level of 15-20 mcg/ml necessary for treatment of PNA.  Goal of Therapy:  Vancomycin trough level = 15-20 mcg/ml  Plan:   Change Vancomycin to 750mg  IV q12h to begin with next scheduled dose  Cefepime 1gm IV q12h  Check vancomycin trough level when appropriate  Terrilee Files, PharmD 09/07/2012 4:04 PM

## 2012-09-07 NOTE — Progress Notes (Signed)
[facility] Radiation Oncology Dept Therapy Treatment Record Phone [phone]   Radiation Therapy was administered to Brandon Clay on: 09/07/2012  11:03 AM and was treatment # 5 out of a planned course of5treatments.

## 2012-09-07 NOTE — Progress Notes (Signed)
Brandon Clay is an 78 y.o. 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 difficile colitis requiring PO vancomycin, atrial fibrillation on chronic Coumadin, 3 vessel CAD with chronic occlusion of RCA, severe stenosis in proximal LCx & moderately severe disease in mid LAD, S/P PTCA, on ASA and Brilinta (Plavix nonresponder), recent admission 07/24/12-08/02/12 for treatment of C. Diff who was sent to the hospital from radiation for evaluation of generalized weakness, vomiting and diarrhea. Patient was receiving stereotactic body radiotherapy (#4 / 5 planned treatments) and was noticed to be very weak and dizzy upon standing up. On admission, he was was noted to be in atrial fibrillation with heart rates in 150s. He also had significant leukocytosis of 32.7. He is currently being treated for recurrent Clostridium difficile infection as well as right lower extremity cellulitis, and over the course of his hospital stay, has shown gradual improvement in his clinical status.  Echo 07/25/12: Left ventricle: The cavity size was normal. There was mild to moderate concentric hypertrophy. Systolic function was normal. The estimated ejection fraction was in the range of 60% to 65%. Wall motion was normal; there were no regional wall motion abnormalities. - Ventricular septum: Mild septalflattening. - Left atrium: The atrium was mildly dilated. - Right atrium: The atrium was mildly dilated. - Atrial septum: No defect or patent foramen ovale was identified.   Pt feels OK at rest - not short of breath.  Appears very debilitated.    Filed Vitals:   09/06/12 1046 09/06/12 1318 09/06/12 2207 09/07/12 0519  BP: 123/65 116/54 105/53 107/55  Pulse: 114 56 106 103  Temp:  97.4 F (36.3 C) 97.5 F (36.4 C) 97.8 F (36.6 C)  TempSrc:  Oral Oral Oral  Resp:  28 26 22   Height:      Weight:    186 lb 4.6 oz (84.5 kg)  SpO2:  92% 91% 95%    Intake/Output Summary (Last  24 hours) at 09/07/12 0710 Last data filed at 09/06/12 2231  Gross per 24 hour  Intake    873 ml  Output   1295 ml  Net   -422 ml    SUBJECTIVE No chest pain or dyspnea.  Heart rate improved today after addition of oral diltiazem yesterday. LABS: Basic Metabolic Panel:  Recent Labs  40/98/11 0756 09/07/12 0500  NA 131* 134*  K 4.3 4.0  CL 100 102  CO2 20 23  GLUCOSE 187* 136*  BUN 40* 45*  CREATININE 1.14 1.13  CALCIUM 7.5* 7.5*   Liver Function Tests: No results found for this basename: AST, ALT, ALKPHOS, BILITOT, PROT, ALBUMIN,  in the last 72 hours No results found for this basename: LIPASE, AMYLASE,  in the last 72 hours CBC:  Recent Labs  09/06/12 0756 09/07/12 0500  WBC 17.5* 11.9*  HGB 8.6* 7.3*  HCT 25.9* 21.7*  MCV 78.7 78.1  PLT 282 255   Hemoglobin A1C: No results found for this basename: HGBA1C,  in the last 72 hours Fasting Lipid Panel: No results found for this basename: CHOL, HDL, LDLCALC, TRIG, CHOLHDL, LDLDIRECT,  in the last 72 hours Thyroid Function Tests: No results found for this basename: TSH, T4TOTAL, FREET3, T3FREE, THYROIDAB,  in the last 72 hours Radiology/Studies:  Dg Chest Port 1 View  09/03/2012  *RADIOLOGY REPORT*  Clinical Data: Pulmonary edema.  History of lung cancer.  PORTABLE CHEST - 1 VIEW  Comparison: PA and lateral chest 07/24/2012.  Findings: Left upper lobe mass is again seen measuring approximately 4.4 cm cranial-caudal, not markedly changed.  The lungs are emphysematous.  Bilateral alveolar and interstitial opacities have worsened.  There is cardiomegaly.  No pneumothorax or pleural fluid.  IMPRESSION:  1.  Bilateral alveolar and interstitial opacities superimposed on chronic change are likely due to pulmonary edema in this patient with cardiomegaly. Multi focal infection or interstitial tumor spread are felt less likely. 2.  Left upper lobe mass.   Original Report Authenticated By: Holley Dexter, M.D.     PHYSICAL  EXAM BP 107/55  Pulse 103  Temp(Src) 97.8 F (36.6 C) (Oral)  Resp 22  Ht 5\' 10"  (1.778 m)  Wt 186 lb 4.6 oz (84.5 kg)  BMI 26.73 kg/m2  SpO2 95%  General: Elderly, chronically ill appearing, in no acute distress. Head: Normal Neck: Negative for carotid bruits. JVD not elevated. Lungs: basilar rales. Heart: IRRR S1 S2 without murmurs,  Abdomen: Soft, non-tender, non-distended with normoactive bowel sounds. No hepatomegaly. No rebound/guarding. No obvious abdominal masses. Extremities: traceedema.  Distal pedal pulses are 2+ and equal bilaterally. Neuro: nonfocal  TELEMETRY: Telemetry shows atrial fib with controlled ventricular response.  O2 sats - 95% at rest.   ASSESSMENT AND PLAN: 1. Atrial fibrillation with RVR.  On coumadin with target INR 2-3. TSH is normal.  Continue with metoprolol and diltiazem.  2. CAD s/p BMS 07/12/12. Continue ASA 81 mg daily. 3. C. Diff colitis. 4. RLE cellulitis. 5. Lung CA. Poor candidate for resection. 6. DM 7. COPD.  - he is still hypoxic at rest.  PCCM to address. 8. Dyspnea - multifactorial - COPD, lung cancer, ? diastolic dysfunction ( he has normal LV systolic function_)  he may still be volume overloaded today.   Weight is up.  Responded well to IV lasix yesterday. Will reorder.  Principal Problem:   Generalized weakness with diarrhea, vomiting, and abdominal pain in the setting of a recent history of Clostridium difficile colitis Active Problems:   Hypertensive heart disease   Type 2 diabetes mellitus with vascular disease   Benign prostatic hypertrophy   Squamous cell lung cancer left upper lobe   COPD (chronic obstructive pulmonary disease)   CAD (coronary artery disease), native coronary artery   Anemia of chronic disease   Leukocytosis   Rapid atrial fibrillation   Cellulitis of right lower extremity   Hypokalemia   Metabolic acidosis   Hypotension, unspecified    Signed, Elyn Aquas. MD,FACC 09/07/2012 7:10  AM

## 2012-09-07 NOTE — Progress Notes (Signed)
  Radiation Oncology         [phone] ________________________________  Name: Brandon Clay MRN: [id number]  Date: 09/07/2012  DOB: [birth year]  Stereotactic Body Radiotherapy Treatment Procedure Note ( 5 of 5 planned)  Inpatient  NARRATIVE:  Brandon Clay was brought to the stereotactic radiation treatment machine and placed supine on the CT couch. The patient was set up for stereotactic body radiotherapy on the body fix pillow.  3D TREATMENT PLANNING AND DOSIMETRY:  The patient's radiation plan was reviewed and approved prior to starting treatment.  It showed 3-dimensional radiation distributions overlaid onto the planning CT.  The [facility] for the target structures as well as the organs at risk were reviewed. The documentation of this is filed in the radiation oncology EMR.  SIMULATION VERIFICATION:  The patient underwent CT imaging on the treatment unit.  These were carefully aligned to document that the ablative radiation dose would cover the target volume and maximally spare the nearby organs at risk according to the planned distribution.  SPECIAL TREATMENT PROCEDURE: Brandon Clay received high dose ablative stereotactic body radiotherapy to the planned target volume without unforeseen complications. Treatment was delivered uneventfully. The high doses associated with stereotactic body radiotherapy and the significant potential risks require careful treatment set up and patient monitoring constituting a special treatment procedure   STEREOTACTIC TREATMENT MANAGEMENT:  Following delivery, the patient was evaluated clinically. The patient tolerated treatment without significant acute effects, and was transported back to his inpatient room   PLAN: Routine followup in one month.  ________________________________  [name], PhD, MD

## 2012-09-07 NOTE — Progress Notes (Signed)
Patient oxygen sats on venti mask at 50% dropped to 77%.  Patient becoming more and more lethargic.  Upon awakening patient and encouraging deep breathing, patient oxygen saturation increased to 82%.  Patient then placed on nonrebreather and oxygen saturation came up to 100%.  Pt BP was 89/55, HR 120.  Patient still dyspneic and appears ashen. Dr. Darnelle Catalan notified, received orders to transfer patient to ICU.

## 2012-09-07 NOTE — Progress Notes (Signed)
PT Cancellation Note  _X_Treatment cancelled today due to medical issues with patient which prohibited therapy........RN reports acute episode that inhibits PT  ___ Treatment cancelled today due to patient receiving procedure or test   ___ Treatment cancelled today due to patient's refusal to participate   ___ Treatment cancelled today due to  Felecia Shelling  PTA [facility]  Acute  Rehab Pager      [phone]

## 2012-09-07 NOTE — Progress Notes (Signed)
Patient noted to be using accessory muscles and having dyspnea with respirations of 24 while sleeping.  On awakening, patient stated he felt short of breath.  Dr. Darnelle Catalan notified, orders placed for stat CXR, ABG, and BNP level.  Patient is on pulse ox monitor with O2 saturations at 93-4%% on 6L of oxygen.  Will continue to monitor.

## 2012-09-07 NOTE — Progress Notes (Signed)
ANTICOAGULATION CONSULT NOTE - Follow Up  Pharmacy Consult for Coumadin Indication: atrial fibrillation  No Known Allergies  Labs:  Recent Labs  09/05/12 0351 09/06/12 0343 09/06/12 0756 09/07/12 0500  HGB 8.4*  --  8.6* 7.3*  HCT 25.3*  --  25.9* 21.7*  PLT 247  --  282 255  LABPROT 21.6* 16.6*  --  20.3*  INR 1.96* 1.38  --  1.81*  CREATININE 1.14  --  1.14 1.13    Estimated Creatinine Clearance: 58.3 ml/min (by C-G formula based on Cr of 1.13).   Assessment: 78 yo M on chronic Coumadin 4mg /day for h/o atrial fibrillation. Coumadin held prior to admission due to supratherapeutic INR.  INR on 3/27 admit = 3.34 and increased to 5.89 on 3/29 despite holding Coumadin. Pt received Vitamin K 0.5 mg PO x 1 on 3/29.  INR therapeutic on 3/30 and Coumadin resumed.   INR 1.81 this AM. Trending up.  Pt was started on IV amiodarone 3/27 but this was stopped 3/30 with no plans for further PO amiodarone*   H/H trending down, no bleeding reported in chart notes - defer management to MD  Larger warfarin dose given 3/31 am to help overcome Vit K given on 3/29. Will use lower dose tonight as INR is responding and trending back up.  Goal of Therapy:  INR 2-3 Monitor platelets by anticoagulation protocol: Yes   Plan:   Coumadin 2mg  po x 1  Daily PT/INR  Pharmacy will f/u  Darrol Angel, PharmD Pager: [phone] 09/07/2012 8:37 AM

## 2012-09-07 NOTE — Consult Note (Signed)
PULMONARY  / CRITICAL CARE MEDICINE  Name: Brandon Clay MRN: [id number] DOB: 1934/12/23    ADMISSION DATE:  09/02/2012 CONSULTATION DATE:  09/07/2012  REFERRING MD :  [facility] PRIMARY SERVICE:  TRH  CHIEF COMPLAINT:  Acute respiratory failure  BRIEF PATIENT DESCRIPTION: 78 yo with COPD, recently diagnosed SCLCA currently undergoing SBRT, AF recently treated with Amiodarone and CAD with ischemic cardiomyopathy who developed progressive pulmonary infiltrates, hypoxemia and respiratory distress requiring transfer to ICU.  PCCM was asked to evaluate.  SIGNIFICANT EVENTS / STUDIES:  3/27  Admitted 4/1    Transferred to ICU with hypoxia / respiratory distress  LINES / TUBES:  CULTURES: 3/28  C. Dif PCR >>> POS 4/1    Urine >>> 4/1    MRSA PCR >>> neg  ANTIBIOTICS: Flagyl 3/27 >>> Vancomycin PO 3/27 >>> Cefepime 4/1 >>> Levaquin 4/1 >>> Vancomycin IV 4/1 >>>  The patient is in respiratory distress and unable to provide history, which was obtained for available medical records.  HISTORY OF PRESENT ILLNESS:  78 yo with COPD, recently diagnosed SCLCA currently undergoing SBRT, AF recently treated with Amiodarone and CAD with ischemic cardiomyopathy who developed progressive pulmonary infiltrates, hypoxemia and respiratory distress requiring transfer to ICU.  PCCM was asked to evaluate.  PAST MEDICAL HISTORY :  Past Medical History  Diagnosis Date  . Hypertension   . Hypercholesterolemia     statin intolerant  . Diabetes mellitus, type 2   . Benign prostatic hypertrophy   . Emphysema   . Lung cancer     Followed by Dr. [name] (LUL squamous cell carcinoma)  . COPD (chronic obstructive pulmonary disease)   . CKD (chronic kidney disease)   . Colitis   . Coronary artery disease     Cath 07/02/12 3v CAD w/ chronic occlusion of RCA (fills from left collaterals), severe stenosis prox LCx & moderately severe dz in mid LAD; s/p PTCA/BMS to prox LCx 07/12/12   . Normocytic anemia   .  Persistent atrial fibrillation   . Atrial enlargement, left    Past Surgical History  Procedure Laterality Date  . Hemorrhoid surgery    . Tee without cardioversion N/A 07/29/2012    Procedure: TRANSESOPHAGEAL ECHOCARDIOGRAM (TEE);  Surgeon: Laurey Morale, MD;  Location: [facility] ENDOSCOPY;  Service: Cardiovascular;  Laterality: N/A;  patient coming from [name] room 1236 talk to jennifer at [place]  talk to [name] from care link will pick up pat. at 8:15 from [name]  . Cardioversion N/A 07/29/2012    Procedure: CARDIOVERSION;  Surgeon: Laurey Morale, MD;  Location: [facility] ENDOSCOPY;  Service: Cardiovascular;  Laterality: N/A;  . Cardiac catheterization    . Coronary angioplasty with stent placement    . Lung biopsy     Prior to Admission medications   Medication Sig Start Date End Date Taking? Authorizing Provider  aspirin EC 81 MG tablet Take 81 mg by mouth every other day.   Yes Historical Provider, MD  budesonide-formoterol (SYMBICORT) 160-4.5 MCG/ACT inhaler Inhale 2 puffs into the lungs 2 (two) times daily.   Yes Historical Provider, MD  diltiazem (CARDIZEM CD) 180 MG 24 hr capsule Take 180 mg by mouth daily. 08/03/12  Yes Kathlen Mody, MD  finasteride (PROSCAR) 5 MG tablet Take 5 mg by mouth every morning.  05/31/12  Yes Historical Provider, MD  insulin detemir (LEVEMIR) 100 UNIT/ML injection Inject 18 Units into the skin at bedtime. 08/03/12  Yes Kathlen Mody, MD  loperamide (IMODIUM A-D) 2 MG tablet  Take 2 mg by mouth 4 (four) times daily as needed for diarrhea or loose stools.   Yes Historical Provider, MD  metFORMIN (GLUCOPHAGE-XR) 500 MG 24 hr tablet Take 1,000 mg by mouth 2 (two) times daily. 07/13/12  Yes Jessica A Hope, PA-C  Tamsulosin HCl (FLOMAX) 0.4 MG CAPS Take 0.4 mg by mouth every morning.  05/31/12  Yes Historical Provider, MD  Ticagrelor (BRILINTA) 90 MG TABS tablet Take 1 tablet (90 mg total) by mouth 2 (two) times daily. 07/13/12  Yes Jessica A Hope, PA-C  metoprolol  (LOPRESSOR) 100 MG tablet Take 0.5 tablets (50 mg total) by mouth 2 (two) times daily. 08/11/12   Beatrice Lecher, PA-C  warfarin (COUMADIN) 3 MG tablet Take 4 mg by mouth daily at 6 PM. 08/03/12   Kathlen Mody, MD   No Known Allergies  FAMILY HISTORY:  Family History  Problem Relation Age of Onset  . Stroke Mother   . Heart disease Father     died of heart attack  . Lung cancer Brother   . Hypertension Brother   . Cancer Brother     brain cancer  . Hypothyroidism Brother   . Hyperlipidemia Sister   . Hyperlipidemia Sister    SOCIAL HISTORY:  reports that he quit smoking about 5 years ago. His smoking use included Cigarettes. He started smoking about 54 years ago. He has a 25 pack-year smoking history. He has never used smokeless tobacco. He reports that he does not drink alcohol or use illicit drugs.  REVIEW OF SYSTEMS:  Unable to provide.  SUBJECTIVE:   VITAL SIGNS: Temp:  [97.5 F (36.4 C)-98.6 F (37 C)] 98.6 F (37 C) (04/01 2000) Pulse Rate:  [30-122] 30 (04/01 1945) Resp:  [19-26] 24 (04/01 1945) BP: (89-107)/(49-63) 97/60 mmHg (04/01 1930) SpO2:  [91 %-100 %] 95 % (04/01 1948) FiO2 (%):  [50 %] 50 % (04/01 1948) Weight:  [84.5 kg (186 lb 4.6 oz)-86.2 kg (190 lb 0.6 oz)] 86.2 kg (190 lb 0.6 oz) (04/01 1845)  PHYSICAL EXAMINATION: General:  Appears acutely ill, increased work of breathing, accessory muscle use Neuro:  Awake, alert, cooperative HEENT:  PERRL, dry membranes Neck:  No JVD Cardiovascular:  Tachycardic, irregular Lungs:  Bilateral diminished air entry, no wheezing, diffuse rales Abdomen:  Mild generalized tenderness, no rebound Musculoskeletal:  Moves all extremities, no pedal edema Skin:  Intact  Recent Labs Lab 09/05/12 0351 09/06/12 0756 09/07/12 0500  NA 130* 131* 134*  K 4.4 4.3 4.0  CL 99 100 102  CO2 18* 20 23  BUN 29* 40* 45*  CREATININE 1.14 1.14 1.13  GLUCOSE 252* 187* 136*    Recent Labs Lab 09/05/12 0351 09/06/12 0756  09/07/12 0500  HGB 8.4* 8.6* 7.3*  HCT 25.3* 25.9* 21.7*  WBC 18.8* 17.5* 11.9*  PLT 247 282 255   CXR:  4/1 >>> Bilateral diffuse airspace disease, small left pleural effusion  ASSESSMENT / PLAN:  Acute hypoxemic respiratory failure COPD / emphysema, with exacerbation Possible HCAP Possible Amiodarone-induced pulmonary toxicity Possible radiation pneumonitis SCLCA Ischemic cardiomyopathy / systolic CHF without evidence of exacerbation Atrial fibrillation with RVR Pseudomembranous colitis Anemia, acute on chronic  -->  Supplemental oxygen -->  May need NIMV / BiPAP if work of breathing remains high -->  Discussed in length with patient, wife and daughter the possibility of intubation and mechanical ventilation - they will discuss it, for now - full code and intubation if needed -->  Continue bronchodilators -->  Add systemic steroids -->  Agree with abx choice, continue for now, but PCT / WBC reassuring -->  Would hold further diuresis for now -->  Would avoid Amiodarone, if need to control HR and hypotensive - consider Digoxin -->  Would avoid transfusions to avoid TRALI / CHF exacerbation, unless Hb<7 or acute ischemia -->  Will follow   Lonia Farber, MD Pulmonary and Critical Care Medicine [facility] Pager: [phone]  09/07/2012, 8:53 PM

## 2012-09-07 NOTE — Progress Notes (Addendum)
[facility] PROGRESS NOTE  Brandon Clay ZOX:[id number] DOB: 01-11-1935 DOA: 09/02/2012 PCP: Brandon Miyamoto, MD  Brief narrative: Brandon Clay is an 78 y.o. 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 difficile colitis requiring PO vancomycin, atrial fibrillation on chronic Coumadin, 3 vessel CAD with chronic occlusion of RCA, severe stenosis in proximal LCx & moderately severe disease in mid LAD, S/P PTCA, on ASA and Brilinta (Plavix nonresponder), recent admission 07/24/12-08/02/12 for treatment of C. Diff who was sent to the hospital from radiation for evaluation of generalized weakness, vomiting and diarrhea. Patient was receiving stereotactic body radiotherapy (#4 / 5 planned treatments) and was noticed to be very weak and dizzy upon standing up.  On admission, he was was noted to be in atrial fibrillation with heart rates in 150s. He also had significant leukocytosis of 32.7. He is currently being treated for recurrent Clostridium difficile infection as well as right lower extremity cellulitis, and over the course of his hospital stay, has shown gradual improvement in his clinical status.  Assessment/Plan: Principal Problem:  Generalized weakness in the setting of nausea, vomiting, diarrhea, dehydration / Recurrent Clostridium difficile colitis -Likely from recurrent C. difficile colitis.  C. difficile PCR positive. N/V resolved. Diarrhea improved. -Replace electrolytes as needed.  -Continue antiemetics as needed. -Status post physical and occupational therapy evaluations: SNF versus 24 hour supervision. Active Problems: Acute respiratory failure with dyspnea / hypoxia secondary to acute diastolic CHF and volume overload  -Diurese as tolerated. -Pro BNP markedly elevated at 25,958 09/05/12.  Repeat in a.m.   Supratherapeutic INR -Given 0.5 mg of vitamin K 09/04/2012.  INR now subtherapeutic. -Coumadin dosing per pharmacy. Metabolic  acidosis -Likely from bicarbonate losses in the stool. Corrected.  Reduce oral replacement dose. -Lactic acid normal at 1.2. Right lower extremity cellulitis -Small fluctuant area noted RLE.  -Continue IV vancomycin, area improving with no current indication for debridement. Atrial fibrillation with RVR  -Known history of atrial fibrillation, on Coumadin. Goal INR 2-3. -Initially treated with Cardizem drip and metoprolol. Cardizem drip discontinued secondary to low blood pressure and given digoxin overnight on 09/02/2012.  Now off Amiodarone drip. -Heart rate improved 90s-low 100s. Leukocytosis  -Most likely from infection (recurrent C. difficile colitis versus cellulitis).  -Continue Flagyl and oral/IV vancomycin. -White blood cell count almost normalized. H/O Hypertension / Hypotension  -Blood pressure currently well-controlled (and on low side). Hypercholesterolemia  -History of statin intolerance. 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 CBGs 179-332. -Metformin currently on hold.  Increase Levemir to 25 units at bedtime and continue sliding scale insulin before meals and bedtime, moderate scale, and increase meal coverage to 6 units of NovoLog. Squamous cell lung cancer left upper lobe  -Completed stereotactic body radiation 09/07/12. -Not a candidate for lung resection.   COPD (chronic obstructive pulmonary disease)  -Continue Symbicort.  No wheeze.  Increased WOB likely from fluid overload. CAD (coronary artery disease), native coronary artery   -On outpatient Brilinta and aspirin.  -Now off Brilinta per cardiology recommendations. Anemia of chronic disease  -Secondary to history multiple medical problems including squamous cell lung cancer.  -No signs of active bleed. Hemoglobin stable with no indication for transfusion. Benign prostatic hypertrophy  -Continue Finasteride and Flomax.  Code Status: Full  Family Communication: Wife updated  at bedside 09/03/12, no one at bedside. Called and updated her by telephone 09/05/12. Disposition Plan: Home versus SNF depending on progress when stable.  Medical Consultants:  Dr. Olga Millers, Cardiology.  Other Consultants:  Physical therapy: Has refused participation thus far, encouraged to work with the physical therapists.  Anti-infectives:  Vancomycin 09/02/2012--->  Flagyl 09/02/2012---->  IV Vancomycin 09/03/12--->  HPI/Subjective: Brandon Clay denies dyspnea. He is more sleepy today.  No complaints of pain.  No N/V.  Weak.  Objective: Filed Vitals:   09/06/12 2207 09/07/12 0519 09/07/12 0844 09/07/12 0939  BP: 105/53 107/55  102/59  Pulse: 106 103  122  Temp: 97.5 F (36.4 C) 97.8 F (36.6 C)  97.7 F (36.5 C)  TempSrc: Oral Oral  Oral  Resp: 26 22  26   Height:      Weight:  84.5 kg (186 lb 4.6 oz)    SpO2: 91% 95% 93% 91%    Intake/Output Summary (Last 24 hours) at 09/07/12 1324 Last data filed at 09/07/12 0930  Gross per 24 hour  Intake    710 ml  Output    750 ml  Net    -40 ml    Exam: Gen:  NAD, non-toxic appearing Cardiovascular:  HSIR, tachy Respiratory:  Lungs mostly clear but increased WOB noted Gastrointestinal:  Abdomen soft, NT/ND, + BS Extremities:  1+ edema, right lower extremity focal area of cellulitis resolving.  Data Reviewed: Basic Metabolic Panel:  Recent Labs Lab 09/01/12 1453  09/02/12 1515 09/02/12 1520 09/03/12 0341 09/04/12 0415 09/05/12 0351 09/06/12 0756 09/07/12 0500  NA 134*  < >  --  134* 135 131* 130* 131* 134*  K 3.9  < >  --  3.6 3.0* 4.2 4.4 4.3 4.0  CL 102  < >  --  98 103 102 99 100 102  CO2 21  < >  --  20 19 17* 18* 20 23  GLUCOSE 161*  < >  --  126* 47* 44* 252* 187* 136*  BUN 24*  < >  --  24* 24* 24* 29* 40* 45*  CREATININE 1.3  < >  --  1.15 1.10 1.04 1.14 1.14 1.13  CALCIUM 8.1*  < >  --  8.2* 7.6* 7.3* 7.2* 7.5* 7.5*  MG 1.2*  --   --  1.2*  --   --   --   --   --   PHOS  --   --  2.7  2.6  --   --   --   --   --   < > = values in this interval not displayed. GFR Estimated Creatinine Clearance: 58.3 ml/min (by C-G formula based on Cr of 1.13). Liver Function Tests:  Recent Labs Lab 09/02/12 1052 09/02/12 1520 09/03/12 0341  AST 8 12 12   ALT 6 7 7   ALKPHOS 102 115 102  BILITOT 0.54 0.4 0.3  PROT 5.4* 5.8* 5.1*  ALBUMIN 2.1* 2.4* 2.0*   Coagulation profile  Recent Labs Lab 09/03/12 0341 09/04/12 0912 09/05/12 0351 09/06/12 0343 09/07/12 0500  INR 4.35* 5.89* 1.96* 1.38 1.81*    CBC:  Recent Labs Lab 09/02/12 1052 09/02/12 1520 09/03/12 0341 09/04/12 0415 09/05/12 0351 09/06/12 0756 09/07/12 0500  WBC 32.7* 31.9* 29.4* 27.1* 18.8* 17.5* 11.9*  NEUTROABS 30.7* 29.8*  --   --   --   --   --   HGB 8.0* 8.8* 8.3* 8.0* 8.4* 8.6* 7.3*  HCT 25.2* 26.5* 24.6* 24.1* 25.3* 25.9* 21.7*  MCV 80.0 80.8 79.1 79.5 79.6 78.7 78.1  PLT 242 283 263 287 247 282 255   BNP (last 3  results)  Recent Labs  07/24/12 2140 08/11/12 1346 09/05/12 0731  PROBNP 15551.0* 463.0* 25958.0*   CBG:  Recent Labs Lab 09/05/12 1208 09/05/12 1631 09/05/12 2126 09/06/12 0820 09/06/12 1146  GLUCAP 273* 332* 277* 179* 266*   Hgb A1c No results found for this basename: HGBA1C,  in the last 72 hours Microbiology Recent Results (from the past 240 hour(s))  MRSA PCR SCREENING     Status: None   Collection Time    09/02/12 10:21 PM      Result Value Range Status   MRSA by PCR NEGATIVE  NEGATIVE Final   Comment:            The GeneXpert MRSA Assay (FDA     approved for NASAL specimens     only), is one component of a     comprehensive MRSA colonization     surveillance program. It is not     intended to diagnose MRSA     infection nor to guide or     monitor treatment for     MRSA infections.  CLOSTRIDIUM DIFFICILE BY PCR     Status: Abnormal   Collection Time    09/03/12 11:27 PM      Result Value Range Status   C difficile by pcr POSITIVE (*) NEGATIVE Final    Comment: CRITICAL RESULT CALLED TO, READ BACK BY AND VERIFIED WITH:     RICHARDSON,N RN 09/03/12 1410 WOOTEN,K     Procedures and Diagnostic Studies:  Dg Abd 1 View 09/04/2012 IMPRESSION: No evidence of bowel obstruction.  Gas in mildly prominent right and transverse colon.   Original Report Authenticated By: Charlett Nose, M.D.     Dg Chest Port 1 View 09/04/2012 IMPRESSION: Worsening bilateral airspace opacities, edema versus infection.  Stable left upper lobe mass.  Small bilateral effusions.   Original Report Authenticated By: Charlett Nose, M.D.     Scheduled Meds: . aspirin EC  81 mg Oral QODAY  . budesonide-formoterol  2 puff Inhalation BID  . diltiazem  120 mg Oral Daily  . feeding supplement  237 mL Oral BID BM  . finasteride  5 mg Oral q morning - 10a  . insulin aspart  0-15 Units Subcutaneous TID WC  . insulin aspart  0-5 Units Subcutaneous QHS  . insulin aspart  4 Units Subcutaneous TID WC  . insulin detemir  18 Units Subcutaneous QHS  . metoprolol  50 mg Oral BID  . metronidazole  500 mg Intravenous Q8H  . multivitamin with minerals  1 tablet Oral Daily  . nutrition supplement  1 packet Oral BID PC  . sodium bicarbonate  650 mg Oral QID  . sodium chloride  3 mL Intravenous Q12H  . tamsulosin  0.4 mg Oral q morning - 10a  . vancomycin  1,500 mg Intravenous Q24H  . vancomycin  125 mg Oral Q6H  . warfarin  2 mg Oral ONCE-1800  . Warfarin - Pharmacist Dosing Inpatient   Does not apply q1800   Continuous Infusions: . sodium chloride 10 mL/hr at 09/05/12 1900    Time spent: 25 minutes.   LOS: 5 days   RAMA,CHRISTINA  [facility] Pager [phone].  If 8PM-8AM, please contact night-coverage at [email or web address], password [facility] 09/07/2012, 1:24 PM

## 2012-09-07 NOTE — Care Management Note (Signed)
    Page 1 of 2   09/07/2012     5:11:33 PM   CARE MANAGEMENT NOTE 09/07/2012  Patient:  Brandon Clay, Brandon Clay   Account Number:  [id number]  Date Initiated:  09/03/2012  Documentation initiated by:  DAVIS,RHONDA  Subjective/Objective Assessment:   a.fib     Action/Plan:   home   Anticipated DC Date:  09/09/2012   Anticipated DC Plan:  SKILLED NURSING FACILITY  In-house referral  NA      DC Planning Services  CM consult      PAC Choice  NA   Choice offered to / List presented to:  NA   DME arranged  NA      DME agency  NA     HH arranged  NA      HH agency  NA   Status of service:  In process, will continue to follow Medicare Important Message given?  NA - LOS <3 / Initial given by admissions (If response is "NO", the following Medicare IM given date fields will be blank) Date Medicare IM given:   Date Additional Medicare IM given:    Discharge Disposition:    Per UR Regulation:  Reviewed for med. necessity/level of care/duration of stay  If discussed at Long Length of Stay Meetings, dates discussed:    Comments:  09/07/12 Navid Lenzen RN,BSN NCM [phone] TRANSFER FROM SDU.PT-SNF/HH.SPOKE TO DAUGHTER ABOUT D/C PLANS,FEELS FAMILY UNABLE TO PROVIDE 24HR CARE,BUT SHE WILL TALK TO PATIENT'S SPOUSE ABOUT CURRENT RECOMMENDATIONS.WILL CONTINUE TO FOLLOW. [id number] Earlene Plater, RN, BSN, CCM:  CHART REVIEWED AND UPDATED.  Next chart review due on [id number]. NO DISCHARGE NEEDS PRESENT AT THIS TIME. CASE MANAGEMENT [phone]   [id number] Earlene Plater, RN, BSN, CCM:  CHART REVIEWED AND UPDATED.  Next chart review due on [id number]. NO DISCHARGE NEEDS PRESENT AT THIS TIME. CASE MANAGEMENT [phone]

## 2012-09-08 DIAGNOSIS — I5031 Acute diastolic (congestive) heart failure: Secondary | ICD-10-CM

## 2012-09-08 DIAGNOSIS — J700 Acute pulmonary manifestations due to radiation: Secondary | ICD-10-CM | POA: Diagnosis present

## 2012-09-08 DIAGNOSIS — J189 Pneumonia, unspecified organism: Secondary | ICD-10-CM | POA: Diagnosis present

## 2012-09-08 DIAGNOSIS — J984 Other disorders of lung: Secondary | ICD-10-CM

## 2012-09-08 DIAGNOSIS — J9600 Acute respiratory failure, unspecified whether with hypoxia or hypercapnia: Secondary | ICD-10-CM

## 2012-09-08 DIAGNOSIS — J708 Respiratory conditions due to other specified external agents: Secondary | ICD-10-CM

## 2012-09-08 DIAGNOSIS — I509 Heart failure, unspecified: Secondary | ICD-10-CM

## 2012-09-08 LAB — BASIC METABOLIC PANEL
BUN: 43 mg/dL — ABNORMAL HIGH (ref 6–23)
Chloride: 101 mEq/L (ref 96–112)
Creatinine, Ser: 1.19 mg/dL (ref 0.50–1.35)
GFR calc Af Amer: 67 mL/min — ABNORMAL LOW (ref >90)

## 2012-09-08 LAB — PROCALCITONIN: Procalcitonin: 1.21 ng/mL

## 2012-09-08 LAB — GLUCOSE, CAPILLARY
Glucose-Capillary: 314 mg/dL — ABNORMAL HIGH (ref 70–99)
Glucose-Capillary: 111 mg/dL — ABNORMAL HIGH (ref 70–99)
Glucose-Capillary: 289 mg/dL — ABNORMAL HIGH (ref 70–99)
Glucose-Capillary: 356 mg/dL — ABNORMAL HIGH (ref 70–99)

## 2012-09-08 LAB — URINE CULTURE

## 2012-09-08 LAB — CBC
HCT: 23.2 % — ABNORMAL LOW (ref 39.0–52.0)
MCV: 78.6 fL (ref 78.0–100.0)
RDW: 17.4 % — ABNORMAL HIGH (ref 11.5–15.5)
WBC: 8.4 10*3/uL (ref 4.0–10.5)

## 2012-09-08 MED ORDER — SODIUM CHLORIDE 0.9 % IV SOLN
INTRAVENOUS | Status: DC
  Administered 2012-09-09: 21:00:00 via INTRAVENOUS
  Administered 2012-09-13: 10 mL/h via INTRAVENOUS
  Administered 2012-09-14: 07:00:00 via INTRAVENOUS

## 2012-09-08 MED ORDER — SODIUM CHLORIDE 0.9 % IV BOLUS (SEPSIS)
500.0000 mL | Freq: Once | INTRAVENOUS | Status: AC
  Administered 2012-09-08: 500 mL via INTRAVENOUS

## 2012-09-08 MED ORDER — INSULIN ASPART 100 UNIT/ML ~~LOC~~ SOLN
7.0000 [IU] | Freq: Three times a day (TID) | SUBCUTANEOUS | Status: DC
  Administered 2012-09-08 (×2): 7 [IU] via SUBCUTANEOUS

## 2012-09-08 MED ORDER — SODIUM CHLORIDE 0.9 % IV BOLUS (SEPSIS)
500.0000 mL | Freq: Once | INTRAVENOUS | Status: AC
  Administered 2012-09-15: 500 mL via INTRAVENOUS

## 2012-09-08 MED ORDER — INSULIN DETEMIR 100 UNIT/ML ~~LOC~~ SOLN
30.0000 [IU] | Freq: Every day | SUBCUTANEOUS | Status: DC
  Administered 2012-09-08: 30 [IU] via SUBCUTANEOUS
  Filled 2012-09-08: qty 0.3

## 2012-09-08 NOTE — Progress Notes (Addendum)
[facility] PROGRESS NOTE  Brandon Clay ZOX:[id number] DOB: 15-May-1935 DOA: 09/02/2012 PCP: [name], MD  Brief narrative: Brandon Clay is an 78 y.o. 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 difficile colitis requiring PO vancomycin, atrial fibrillation on chronic Coumadin, 3 vessel CAD with chronic occlusion of RCA, severe stenosis in proximal LCx & moderately severe disease in mid LAD, S/P PTCA, on ASA and Brilinta (Plavix nonresponder), recent admission 07/24/12-08/02/12 for treatment of C. Diff who was sent to the hospital from radiation for evaluation of generalized weakness, vomiting and diarrhea. Patient was receiving stereotactic body radiotherapy (#4 / 5 planned treatments) and was noticed to be very weak and dizzy upon standing up.  On admission, he was was noted to be in atrial fibrillation with heart rates in 150s. He also had significant leukocytosis of 32.7. He is currently being treated for recurrent Clostridium difficile infection as well as right lower extremity cellulitis.  After initially showing some improvement in his clinical status, his condition deteriorated on 09/07/12 requiring transfer back to the ICU for acute respiratory failure.  PCCM was consulting and is assisting with management.  Assessment/Plan: Principal Problem:  Generalized weakness in the setting of nausea, vomiting, diarrhea, dehydration / Recurrent Clostridium difficile colitis -Likely from recurrent C. difficile colitis.  C. difficile PCR positive. N/V resolved. Diarrhea improved.  WBC normalized.  Remains on oral vancomycin and IV Flagyl.   -Replace electrolytes as needed.  -Continue antiemetics as needed. -Status post physical and occupational therapy evaluations: SNF versus 24 hour supervision. Active Problems: Acute respiratory failure with dyspnea / hypoxia secondary to acute diastolic CHF and volume overload / R/O HCAP and radiation  pneumonitis -Diuresed. -Pro BNP trending down 25,958 09/05/12--->12,670 09/08/12. -Chest x-ray done 09/07/2012: Persistent opacities worrisome for HCAP.  Empiric Levaquin and cefepime (R. Reddy on vancomycin) started 09/08/2012. -Empiric steroids started by PCCM for radiation pneumonitis. Supratherapeutic INR -Given 0.5 mg of vitamin K 09/04/2012. -Coumadin dosing per pharmacy. Metabolic acidosis -Likely from bicarbonate losses in the stool. Corrected.  Stop oral replacement. Right lower extremity cellulitis -Small fluctuant area noted RLE.  -Continue IV vancomycin, area improving with no current indication for debridement. Atrial fibrillation with RVR  -Known history of atrial fibrillation, on Coumadin. Goal INR 2-3. -Initially treated with Cardizem drip and metoprolol. Cardizem drip discontinued secondary to low blood pressure and given digoxin overnight on 09/02/2012.  Now off Amiodarone drip. -Heart rate improved 90s-low 100s.  Avoid further amiodarone per PCCM recommendations.  Use digoxin, if needed. Leukocytosis  -WBC normalized 09/08/12. H/O Hypertension / Hypotension  -Blood pressure currently well-controlled (and on low side). Hypercholesterolemia  -History of statin intolerance. 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 CBGs still running high despite up titration of insulin. -Metformin currently on hold.  Increase Levemir to 35 units at bedtime and continue sliding scale insulin before meals and bedtime, moderate scale, and increase meal coverage to 6 units of NovoLog Q AC. Squamous cell lung cancer left upper lobe  -Completed stereotactic body radiation 09/07/12. -Not a candidate for lung resection.   COPD (chronic obstructive pulmonary disease)  -Continue Symbicort.  No wheeze.  Empiric steroids started by PCCM on 09/07/12. CAD (coronary artery disease), native coronary artery   -On outpatient Brilinta and aspirin.  -Now off Brilinta per cardiology  recommendations. Anemia of chronic disease  -Secondary to history multiple medical problems including squamous cell lung cancer.  -No signs of active  bleed. Hemoglobin stable with no indication for transfusion. Benign prostatic hypertrophy  -Continue Finasteride and Flomax.  Code Status: Full  Family Communication: Wife updated at bedside 09/03/12, no one at bedside. Called and left a message on her telephone 09/07/2012. Disposition Plan: Home versus SNF depending on progress when stable.   Medical Consultants:  Dr. Olga Millers, Cardiology.  Dr. Lonia Farber, PCCM  Other Consultants:  Physical therapy: Has refused participation thus far, encouraged to work with the physical therapists.  Anti-infectives:  Vancomycin 09/02/2012--->  Flagyl 09/02/2012---->  IV Vancomycin 09/03/12--->  Cefepime 09/07/12--->  Levaquin 09/07/12--->  HPI/Subjective: Brandon Clay is lethargic this morning. His work of breathing appears to be somewhat improved. No nausea, vomiting, or chest pain.  Objective: Filed Vitals:   09/08/12 0300 09/08/12 0400 09/08/12 0500 09/08/12 0600  BP: 80/50 77/55 78/46  77/51  Pulse: 30 106 106 103  Temp:  97.6 F (36.4 C)    TempSrc:  Oral    Resp: 20 21 18 19   Height:      Weight:  86.2 kg (190 lb 0.6 oz)    SpO2: 92% 95% 94% 94%    Intake/Output Summary (Last 24 hours) at 09/08/12 1610 Last data filed at 09/08/12 0600  Gross per 24 hour  Intake   1120 ml  Output   3000 ml  Net  -1880 ml    Exam: Gen:  NAD, non-toxic appearing Cardiovascular:  HSIR, tachy Respiratory:  Lungs mostly clear, decreased WOB noted Gastrointestinal:  Abdomen soft, NT/ND, + BS Extremities:  1+ edema, right lower extremity focal area of cellulitis resolving.  Data Reviewed: Basic Metabolic Panel:  Recent Labs Lab 09/01/12 1453  09/02/12 1515 09/02/12 1520  09/04/12 0415 09/05/12 0351 09/06/12 0756 09/07/12 0500 09/08/12 0335  NA 134*  < >  --   134*  < > 131* 130* 131* 134* 132*  K 3.9  < >  --  3.6  < > 4.2 4.4 4.3 4.0 4.0  CL 102  < >  --  98  < > 102 99 100 102 101  CO2 21  < >  --  20  < > 17* 18* 20 23 26   GLUCOSE 161*  < >  --  126*  < > 44* 252* 187* 136* 110*  BUN 24*  < >  --  24*  < > 24* 29* 40* 45* 43*  CREATININE 1.3  < >  --  1.15  < > 1.04 1.14 1.14 1.13 1.19  CALCIUM 8.1*  < >  --  8.2*  < > 7.3* 7.2* 7.5* 7.5* 7.5*  MG 1.2*  --   --  1.2*  --   --   --   --   --   --   PHOS  --   --  2.7 2.6  --   --   --   --   --   --   < > = values in this interval not displayed. GFR Estimated Creatinine Clearance: 55.4 ml/min (by C-G formula based on Cr of 1.19). Liver Function Tests:  Recent Labs Lab 09/02/12 1052 09/02/12 1520 09/03/12 0341  AST 8 12 12   ALT 6 7 7   ALKPHOS 102 115 102  BILITOT 0.54 0.4 0.3  PROT 5.4* 5.8* 5.1*  ALBUMIN 2.1* 2.4* 2.0*   Coagulation profile  Recent Labs Lab 09/04/12 0912 09/05/12 0351 09/06/12 0343 09/07/12 0500 09/08/12 0335  INR 5.89* 1.96* 1.38 1.81* 2.24*    CBC:  Recent Labs Lab 09/02/12 1052 09/02/12 1520  09/04/12 0415 09/05/12 0351 09/06/12 0756 09/07/12 0500 09/08/12 0335  WBC 32.7* 31.9*  < > 27.1* 18.8* 17.5* 11.9* 8.4  NEUTROABS 30.7* 29.8*  --   --   --   --   --   --   HGB 8.0* 8.8*  < > 8.0* 8.4* 8.6* 7.3* 7.7*  HCT 25.2* 26.5*  < > 24.1* 25.3* 25.9* 21.7* 23.2*  MCV 80.0 80.8  < > 79.5 79.6 78.7 78.1 78.6  PLT 242 283  < > 287 247 282 255 229  < > = values in this interval not displayed. BNP (last 3 results)  Recent Labs  09/05/12 0731 09/07/12 0500 09/08/12 0335  PROBNP 25958.0* 15589.0* 12670.0*   CBG:  Recent Labs Lab 09/05/12 1208 09/05/12 1631 09/05/12 2126 09/06/12 0820 09/06/12 1146  GLUCAP 273* 332* 277* 179* 266*   Microbiology Recent Results (from the past 240 hour(s))  MRSA PCR SCREENING     Status: None   Collection Time    09/02/12 10:21 PM      Result Value Range Status   MRSA by PCR NEGATIVE  NEGATIVE Final    Comment:            The GeneXpert MRSA Assay (FDA     approved for NASAL specimens     only), is one component of a     comprehensive MRSA colonization     surveillance program. It is not     intended to diagnose MRSA     infection nor to guide or     monitor treatment for     MRSA infections.  CLOSTRIDIUM DIFFICILE BY PCR     Status: Abnormal   Collection Time    09/03/12 11:27 PM      Result Value Range Status   C difficile by pcr POSITIVE (*) NEGATIVE Final   Comment: CRITICAL RESULT CALLED TO, READ BACK BY AND VERIFIED WITH:     RICHARDSON,N RN 09/03/12 1410 WOOTEN,K  MRSA PCR SCREENING     Status: None   Collection Time    09/07/12  6:46 PM      Result Value Range Status   MRSA by PCR NEGATIVE  NEGATIVE Final   Comment:            The GeneXpert MRSA Assay (FDA     approved for NASAL specimens     only), is one component of a     comprehensive MRSA colonization     surveillance program. It is not     intended to diagnose MRSA     infection nor to guide or     monitor treatment for     MRSA infections.     Procedures and Diagnostic Studies:  Dg Abd 1 View 09/04/2012 IMPRESSION: No evidence of bowel obstruction.  Gas in mildly prominent right and transverse colon.   Original Report Authenticated By: Charlett Nose, M.D.     Dg Chest Port 1 View 09/04/2012 IMPRESSION: Worsening bilateral airspace opacities, edema versus infection.  Stable left upper lobe mass.  Small bilateral effusions.   Original Report Authenticated By: Charlett Nose, M.D.     Dg Chest Port 1 View 09/07/2012  IMPRESSION:  Again noted bilateral diffuse airspace disease which may be due to bilateral pulmonary edema or bilateral pneumonia.  Small left pleural effusion.   Original Report Authenticated By: Natasha Mead, M.D.     Scheduled Meds: . aspirin EC  81 mg  Oral QODAY  . budesonide-formoterol  2 puff Inhalation BID  . ceFEPime (MAXIPIME) IV  1 g Intravenous Q12H  . diltiazem  120 mg Oral Daily  .  feeding supplement  237 mL Oral BID BM  . finasteride  5 mg Oral q morning - 10a  . furosemide  40 mg Intravenous Once  . insulin aspart  0-15 Units Subcutaneous TID WC  . insulin aspart  0-5 Units Subcutaneous QHS  . insulin aspart  6 Units Subcutaneous TID WC  . insulin detemir  25 Units Subcutaneous QHS  . levofloxacin (LEVAQUIN) IV  500 mg Intravenous Q24H  . methylPREDNISolone (SOLU-MEDROL) injection  80 mg Intravenous Q8H  . metoprolol  50 mg Oral BID  . metronidazole  500 mg Intravenous Q8H  . multivitamin with minerals  1 tablet Oral Daily  . nutrition supplement  1 packet Oral BID PC  . sodium chloride  3 mL Intravenous Q12H  . tamsulosin  0.4 mg Oral q morning - 10a  . vancomycin  125 mg Oral Q6H  . vancomycin  750 mg Intravenous Q12H  . warfarin  2 mg Oral ONCE-1800  . Warfarin - Pharmacist Dosing Inpatient   Does not apply q1800   Continuous Infusions: . sodium chloride 10 mL/hr at 09/05/12 1900    Time spent: 35 minutes.   LOS: 6 days   Xandra Laramee  [facility] Pager [phone].  If 8PM-8AM, please contact night-coverage at [email or web address], password [facility] 09/08/2012, 7:18 AM

## 2012-09-08 NOTE — Progress Notes (Deleted)
PULMONARY  / CRITICAL CARE MEDICINE  Name: Brandon Clay MRN: [id number] DOB: [birth year]    ADMISSION DATE:  09/02/2012 CONSULTATION DATE:  09/07/2012  REFERRING MD :  [facility] PRIMARY SERVICE:  [facility]  CHIEF COMPLAINT:  Acute respiratory failure  BRIEF PATIENT DESCRIPTION: 78 yo with COPD, recently diagnosed SCLCA currently undergoing SBRT, AF recently treated with Amiodarone and CAD with ischemic cardiomyopathy who developed progressive pulmonary infiltrates, hypoxemia and respiratory distress requiring transfer to ICU.  PCCM was asked to evaluate.  SIGNIFICANT EVENTS / STUDIES:  3/27  Admitted 4/1    Transferred to ICU with hypoxia / respiratory distress 4/2 Afib with CVR, bp soft LINES / TUBES:  CULTURES: 3/28  C. Dif PCR >>> POS 4/1    Urine >>> 4/1    MRSA PCR >>> neg  ANTIBIOTICS: Flagyl 3/27 >>> Vancomycin PO 3/27 >>> Cefepime 4/1 >>> Levaquin 4/1 >>> Vancomycin IV 4/1 >>>  SUBJECTIVE:  BP soft, on 50% v mask VITAL SIGNS: Temp:  [96.5 F (35.8 C)-98.8 F (37.1 C)] 96.5 F (35.8 C) (04/02 0800) Pulse Rate:  [30-122] 106 (04/02 0800) Resp:  [18-26] 21 (04/02 0800) BP: (71-102)/(46-63) 88/60 mmHg (04/02 0800) SpO2:  [90 %-100 %] 96 % (04/02 0800) FiO2 (%):  [50 %] 50 % (04/02 0800) Weight:  [86.2 kg (190 lb 0.6 oz)] 86.2 kg (190 lb 0.6 oz) (04/02 0400) 50% venti mask PHYSICAL EXAMINATION: General:  Appears  ill, on 50% venti mask Neuro:  Awake, alert, cooperative HEENT:  PERRL, dry membranes Neck:  No JVD Cardiovascular:  Tachycardic, irregular Lungs:  Bilateral diminished air entry, no wheezing, diffuse rales Abdomen:  Mild generalized tenderness, no rebound Musculoskeletal:  Moves all extremities, no pedal edema Skin:  Intact  Recent Labs Lab 09/06/12 0756 09/07/12 0500 09/08/12 0335  NA 131* 134* 132*  K 4.3 4.0 4.0  CL 100 102 101  CO2 20 23 26   BUN 40* 45* 43*  CREATININE 1.14 1.13 1.19  GLUCOSE 187* 136* 110*    Recent Labs Lab 09/06/12 0756  09/07/12 0500 09/08/12 0335  HGB 8.6* 7.3* 7.7*  HCT 25.9* 21.7* 23.2*  WBC 17.5* 11.9* 8.4  PLT 282 255 229   Lab Results  Component Value Date   INR 2.24* 09/08/2012   INR 1.81* 09/07/2012   INR 1.38 09/06/2012    Imaging: Dg Chest Port 1 View  09/07/2012  *RADIOLOGY REPORT*  Clinical Data: Worsening dyspnea  PORTABLE CHEST - 1 VIEW  Comparison: 09/04/2012  Findings: Cardiomediastinal silhouette is stable.  Again noted bilateral diffuse airspace disease which may be due to bilateral pulmonary edema or bilateral pneumonia.  Small left pleural effusion.  IMPRESSION:  Again noted bilateral diffuse airspace disease which may be due to bilateral pulmonary edema or bilateral pneumonia.  Small left pleural effusion.   Original Report Authenticated By: Natasha Mead, M.D.      ASSESSMENT / PLAN:  Acute hypoxemic respiratory failure COPD / emphysema, with exacerbation Possible HCAP Possible Amiodarone-induced pulmonary toxicity Possible radiation pneumonitis SCLCA Ischemic cardiomyopathy / systolic CHF without evidence of exacerbation Atrial fibrillation with RVR Pseudomembranous colitis Anemia, acute on chronic  -->  Supplemental oxygen -->  May need NIMV / BiPAP if work of breathing remains high -->  Discussed in length with patient, wife and daughter the possibility of intubation and mechanical ventilation - they will discuss it, for now - full code and intubation if needed -->  Continue bronchodilators -->  Add systemic steroids -->  Agree with abx choice,  continue for now, but PCT / WBC reassuring -->  Would hold further diuresis for now -->  Would avoid Amiodarone, if need to control HR and hypotensive - consider Digoxin-4-2 gentle hydration -->  Would avoid transfusions to avoid TRALI / CHF exacerbation, unless Hb<7 or acute ischemia -->  Will follow    Global: 4-2 hold diuresis , bp very soft, gentle fluid bolus. Follow CxR BP closely.  Brett Canales Jenifer Struve ACNP [facility] PCCM Pager  [phone] till 3 pm If no answer page [phone] 09/08/2012, 8:42 AM

## 2012-09-08 NOTE — Progress Notes (Signed)
ANTICOAGULATION CONSULT NOTE - Follow Up  Pharmacy Consult for Coumadin Indication: atrial fibrillation  No Known Allergies  Labs:  Recent Labs  09/06/12 0343  09/06/12 0756 09/07/12 0500 09/08/12 0335  HGB  --   < > 8.6* 7.3* 7.7*  HCT  --   --  25.9* 21.7* 23.2*  PLT  --   --  282 255 229  LABPROT 16.6*  --   --  20.3* 23.8*  INR 1.38  --   --  1.81* 2.24*  CREATININE  --   --  1.14 1.13 1.19  < > = values in this interval not displayed.  Estimated Creatinine Clearance: 55.4 ml/min (by C-G formula based on Cr of 1.19).   Assessment: 78 yo M on chronic Coumadin 4mg /day for h/o atrial fibrillation. Coumadin held prior to admission due to supratherapeutic INR.  INR on 3/27 admit = 3.34 and increased to 5.89 on 3/29 despite holding Coumadin. Pt received Vitamin K 0.5 mg PO x 1 on 3/29.  INR therapeutic on 3/30 and Coumadin resumed.   Coumadin held 4/1 per CCM.  Only 2 doses given so far.   INR rising, now therapeutic, despite no coumadin last night.    H/H trending down, stable today, no bleeding reported.    Will hold coumadin tonight as INR increased overnight despite held dose, which is similar to initial INR pattern on admission.  Also, pt on flagyl which can cause elevations in INR.  Goal of Therapy:  INR 2-3 Monitor platelets by anticoagulation protocol: Yes   Plan:   Hold coumadin tonight  Daily PT/INR  Pharmacy will f/u  Haynes Hoehn, PharmD 09/08/2012 10:00 AM  Pager: [phone]

## 2012-09-08 NOTE — Progress Notes (Signed)
PULMONARY  / CRITICAL CARE MEDICINE  Name: Brandon Clay MRN: [id number] DOB: 15-Dec-1934    ADMISSION DATE:  09/02/2012 CONSULTATION DATE:  09/07/12  REFERRING MD :  [facility] PRIMARY SERVICE: [facility]  CHIEF COMPLAINT:  dyspnea  BRIEF PATIENT DESCRIPTION:  78 yo with COPD, recently diagnosed SCLCA currently undergoing SBRT, AF recently treated with Amiodarone and CAD with ischemic cardiomyopathy who developed progressive pulmonary infiltrates, hypoxemia and respiratory distress requiring transfer to ICU. PCCM was asked to evaluate.      SIGNIFICANT EVENTS / STUDIES:  3/27 Admitted  4/1 Transferred to ICU with hypoxia / respiratory distress  4/2 Afib with CVR, bp soft   LINES / TUBES: PIV  CULTURES: 3/28 C. Dif PCR >>> POS  4/1 Urine >>>  4/1 MRSA PCR >>> neg   ANTIBIOTICS: Flagyl 3/27 >>>  Vancomycin PO 3/27 >>>  Cefepime 4/1 >>>  Levaquin 4/1 >>>  Vancomycin IV 4/1 >>>     SUBJECTIVE:  Pt remains dyspneic at present and soft BP VITAL SIGNS: Temp:  [96.5 F (35.8 C)-98.8 F (37.1 C)] 97.6 F (36.4 C) (04/02 1600) Pulse Rate:  [30-122] 105 (04/02 1600) Resp:  [18-24] 20 (04/02 1600) BP: (70-97)/(46-66) 81/56 mmHg (04/02 1600) SpO2:  [86 %-100 %] 95 % (04/02 1600) FiO2 (%):  [40 %-50 %] 40 % (04/02 1200) Weight:  [86.2 kg (190 lb 0.6 oz)] 86.2 kg (190 lb 0.6 oz) (04/02 0400) HEMODYNAMICS: Soft BP   VENTILATOR SETTINGS: Vent Mode:  [-]  FiO2 (%):  [40 %-50 %] 40 % On VM oxygen INTAKE / OUTPUT: Intake/Output     04/01 0701 - 04/02 0700 04/02 0701 - 04/03 0700   P.O. 440 360   I.V. (mL/kg) 250 (2.9) 180 (2.1)   IV Piggyback 500 250   Total Intake(mL/kg) 1190 (13.8) 790 (9.2)   Urine (mL/kg/hr) 3000 (1.5) 280 (0.3)   Total Output 3000 280   Net -1810 +510          PHYSICAL EXAMINATION: General:  Ill appearing  Neuro:  awake HEENT:  Dry mucus membranes Cardiovascular:  RRR Lungs:  Rales, poor airflow Abdomen:  Soft nt Musculoskeletal:  From, no jt  deform Skin:  Clear   LABS:  Recent Labs Lab 09/02/12 1052 09/02/12 1515  09/02/12 1520 09/03/12 0341  09/04/12 0801  09/05/12 0731 09/06/12 0343 09/06/12 0756 09/07/12 0500 09/07/12 1520 09/07/12 2103 09/08/12 0335  HGB 8.0*  --   < > 8.8* 8.3*  < >  --   < >  --   --  8.6* 7.3*  --   --  7.7*  WBC 32.7*  --   < > 31.9* 29.4*  < >  --   < >  --   --  17.5* 11.9*  --   --  8.4  PLT 242  --   < > 283 263  < >  --   < >  --   --  282 255  --   --  229  NA 136  --   < > 134* 135  < >  --   < >  --   --  131* 134*  --   --  132*  K 3.6  --   < > 3.6 3.0*  < >  --   < >  --   --  4.3 4.0  --   --  4.0  CL 105  --   < > 98 103  < >  --   < >  --   --  100 102  --   --  101  CO2 19*  --   < > 20 19  < >  --   < >  --   --  20 23  --   --  26  GLUCOSE 114*  --   < > 126* 47*  < >  --   < >  --   --  187* 136*  --   --  110*  BUN 24.2  --   < > 24* 24*  < >  --   < >  --   --  40* 45*  --   --  43*  CREATININE 1.2  --   < > 1.15 1.10  < >  --   < >  --   --  1.14 1.13  --   --  1.19  CALCIUM 7.8*  --   < > 8.2* 7.6*  < >  --   < >  --   --  7.5* 7.5*  --   --  7.5*  MG  --   --   --  1.2*  --   --   --   --   --   --   --   --   --   --   --   PHOS  --  2.7  --  2.6  --   --   --   --   --   --   --   --   --   --   --   AST 8  --   --  12 12  --   --   --   --   --   --   --   --   --   --   ALT 6  --   --  7 7  --   --   --   --   --   --   --   --   --   --   ALKPHOS 102  --   --  115 102  --   --   --   --   --   --   --   --   --   --   BILITOT 0.54  --   --  0.4 0.3  --   --   --   --   --   --   --   --   --   --   PROT 5.4*  --   --  5.8* 5.1*  --   --   --   --   --   --   --   --   --   --   ALBUMIN 2.1*  --   --  2.4* 2.0*  --   --   --   --   --   --   --   --   --   --   APTT  --   --   --  58*  --   --   --   --   --   --   --   --   --   --   --   INR  --   --   < > 3.34* 4.35*  --   --   < >  --  1.38  --  1.81*  --   --  2.24*  LATICACIDVEN  --   --   --   --   --   --   1.2  --   --   --   --   --   --   --   --   PROCALCITON  --   --   --   --   --   --   --   --   --   --   --   --   --  1.36 1.21  PROBNP  --   --   --   --   --   --   --   --  78295.6*  --   --  15589.0*  --   --  12670.0*  PHART  --   --   --   --   --   --   --   --   --   --   --   --  7.517*  --   --   PCO2ART  --   --   --   --   --   --   --   --   --   --   --   --  29.1*  --   --   PO2ART  --   --   --   --   --   --   --   --   --   --   --   --  51.0*  --   --   < > = values in this interval not displayed.  Recent Labs Lab 09/07/12 1204 09/07/12 1616 09/08/12 0748 09/08/12 1157 09/08/12 1552  GLUCAP 180* 147* 111* 202* 289*    CXR: low lung volumes, infiltrates  ASSESSMENT / PLAN: Principal Problem:   Generalized weakness with diarrhea, vomiting, and abdominal pain in the setting of a recent history of Clostridium difficile colitis Active Problems:   Hypertensive heart disease   Type 2 diabetes mellitus with vascular disease   Benign prostatic hypertrophy   Squamous cell lung cancer left upper lobe   COPD (chronic obstructive pulmonary disease)   CAD (coronary artery disease), native coronary artery   Anemia of chronic disease   Leukocytosis   Rapid atrial fibrillation   Cellulitis of right lower extremity   Hypokalemia   Metabolic acidosis   Hypotension, unspecified   Acute respiratory failure   Acute diastolic CHF (congestive heart failure)   Volume overload   Radiation pneumonitis   Amiodarone pulmonary toxicity   HCAP (healthcare-associated pneumonia)   PULMONARY A:acute hypoxemic resp failure, bilat infiltrates, copd exac. Poss XRT pneumonitis, lung CA, HCap?, amiod toxic? P:   May yet need vent Steroids Hold diuresis BDs Pt still full code  CARDIOVASCULAR A: ischemic CM, no pulm edema P:  Hold bp meds Per cardiology Cont cardiazem  RENAL A:  No renal issues P:   monitor  GASTROINTESTINAL A:  C diff colitis P:   Cont abx for  same  HEMATOLOGIC A:  anemia P:  monitor  INFECTIOUS A:  ?hcap, c diff colitis P:   See dashb oard  ENDOCRINE A:  No issues P:   monitor  NEUROLOGIC A:  No issues P:   monitor  TODAY'S SUMMARY: 78 yo WM with pulm infiltrates ?etiology, lung ca xrt, high risk intubation.  Plan oxygen, steroids, abx, hold diuresis for now, f/u closely, needs EOL discussions ?pall care consult  I have personally obtained a history, examined the  patient, evaluated laboratory and imaging results, formulated the assessment and plan and placed orders. CRITICAL CARE: The patient is critically ill with multiple organ systems failure and requires high complexity decision making for assessment and support, frequent evaluation and titration of therapies, application of advanced monitoring technologies and extensive interpretation of multiple databases. Critical Care Time devoted to patient care services described in this note is 35 minutes.   Dorcas Carrow Beeper  [phone]  Cell  [phone]  If no response or cell goes to voicemail, call beeper [phone]  Pulmonary and Critical Care Medicine [facility] Pager: [phone]  09/08/2012, 5:00 PM

## 2012-09-08 NOTE — Progress Notes (Signed)
PT Cancellation Note  Patient Details Name: Brandon Clay MRN: [id number] DOB: 04-26-1935   Cancelled Treatment:    Reason Eval/Treat Not Completed: Medical issues which prohibited therapy. Pt with low BP and on NRB due to desaturation. Will check anther day.    Vista Deck 09/08/2012, 12:08 PM

## 2012-09-08 NOTE — Progress Notes (Signed)
Brandon Clay is an 78 y.o. 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 difficile colitis requiring PO vancomycin, atrial fibrillation on chronic Coumadin, 3 vessel CAD with chronic occlusion of RCA, severe stenosis in proximal LCx & moderately severe disease in mid LAD, S/P PTCA, on ASA and Brilinta (Plavix nonresponder), recent admission 07/24/12-08/02/12 for treatment of C. Diff who was sent to the hospital from radiation for evaluation of generalized weakness, vomiting and diarrhea. Patient was receiving stereotactic body radiotherapy (#4 / 5 planned treatments) and was noticed to be very weak and dizzy upon standing up. On admission, he was was noted to be in atrial fibrillation with heart rates in 150s. He also had significant leukocytosis of 32.7. He is currently being treated for recurrent Clostridium difficile infection as well as right lower extremity cellulitis, and over the course of his hospital stay, has shown gradual improvement in his clinical status.  Echo 07/25/12: Left ventricle: The cavity size was normal. There was mild to moderate concentric hypertrophy. Systolic function was normal. The estimated ejection fraction was in the range of 60% to 65%. Wall motion was normal; there were no regional wall motion abnormalities. - Ventricular septum: Mild septal flattening. - Left atrium: The atrium was mildly dilated. - Right atrium: The atrium was mildly dilated. - Atrial septum: No defect or patent foramen ovale was identified.   Pt was asleep this am and would not wake up.  Probably too sleepy to take meds   He had increasing work of breathing yesterday and was transferred to ICU.     Filed Vitals:   09/08/12 0300 09/08/12 0400 09/08/12 0500 09/08/12 0600  BP: 80/50 77/55 78/46  77/51  Pulse: 30 106 106 103  Temp:  97.6 F (36.4 C)    TempSrc:  Oral    Resp: 20 21 18 19   Height:      Weight:  190 lb 0.6 oz (86.2 kg)    SpO2: 92%  95% 94% 94%    Intake/Output Summary (Last 24 hours) at 09/08/12 0750 Last data filed at 09/08/12 0700  Gross per 24 hour  Intake   1180 ml  Output   3000 ml  Net  -1820 ml    SUBJECTIVE No chest pain or dyspnea.  Heart rate improved today after addition of oral diltiazem yesterday. LABS: Basic Metabolic Panel:  Recent Labs  47/82/95 0500 09/08/12 0335  NA 134* 132*  K 4.0 4.0  CL 102 101  CO2 23 26  GLUCOSE 136* 110*  BUN 45* 43*  CREATININE 1.13 1.19  CALCIUM 7.5* 7.5*   Liver Function Tests: No results found for this basename: AST, ALT, ALKPHOS, BILITOT, PROT, ALBUMIN,  in the last 72 hours No results found for this basename: LIPASE, AMYLASE,  in the last 72 hours CBC:  Recent Labs  09/07/12 0500 09/08/12 0335  WBC 11.9* 8.4  HGB 7.3* 7.7*  HCT 21.7* 23.2*  MCV 78.1 78.6  PLT 255 229   Hemoglobin A1C: No results found for this basename: HGBA1C,  in the last 72 hours Fasting Lipid Panel: No results found for this basename: CHOL, HDL, LDLCALC, TRIG, CHOLHDL, LDLDIRECT,  in the last 72 hours Thyroid Function Tests: No results found for this basename: TSH, T4TOTAL, FREET3, T3FREE, THYROIDAB,  in the last 72 hours Radiology/Studies:  Dg Chest Port 1 View  09/03/2012  *RADIOLOGY REPORT*  Clinical Data: Pulmonary edema.  History of lung cancer.  PORTABLE CHEST -  1 VIEW  Comparison: PA and lateral chest 07/24/2012.  Findings: Left upper lobe mass is again seen measuring approximately 4.4 cm cranial-caudal, not markedly changed.  The lungs are emphysematous.  Bilateral alveolar and interstitial opacities have worsened.  There is cardiomegaly.  No pneumothorax or pleural fluid.  IMPRESSION:  1.  Bilateral alveolar and interstitial opacities superimposed on chronic change are likely due to pulmonary edema in this patient with cardiomegaly. Multi focal infection or interstitial tumor spread are felt less likely. 2.  Left upper lobe mass.   Original Report Authenticated By:  Holley Dexter, M.D.     PHYSICAL EXAM BP 77/51  Pulse 103  Temp(Src) 97.6 F (36.4 C) (Oral)  Resp 19  Ht 5\' 10"  (1.778 m)  Wt 190 lb 0.6 oz (86.2 kg)  BMI 27.27 kg/m2  SpO2 94%  General: Elderly, chronically ill appearing, asleep Head: Normal Neck: Negative for carotid bruits. JVD not elevated. Lungs: clear anteriorly Heart: IRRR S1 S2 without murmurs,  Abdomen: Soft, non-tender, non-distended with normoactive bowel sounds. No hepatomegaly. No rebound/guarding. No obvious abdominal masses. Extremities: traceedema.  Distal pedal pulses are 2+ and equal bilaterally. Neuro: nonfocal  TELEMETRY: Telemetry shows atrial fib with controlled ventricular response.  O2 sats - 95% at rest.   ASSESSMENT AND PLAN: 1. Atrial fibrillation with RVR.  Rate is ok.  He is mildly hypotensive - likely multifactorial + extra diuresis yesterday.  OK to hold PO cardizem until he is more awake.  If he does not wake up later in the am, we may need to start cardizem drip  2. CAD s/p BMS 07/12/12. Continue ASA 81 mg daily. 3. C. Diff colitis. 4. RLE cellulitis. 5. Lung CA. Poor candidate for resection. 6. DM 7. COPD.  - he is still hypoxic at rest.  PCCM to address. 8. Dyspnea - multifactorial - COPD, lung cancer, ? diastolic dysfunction ( he has normal LV systolic function_)  he may still be volume overloaded today.     Principal Problem:   Generalized weakness with diarrhea, vomiting, and abdominal pain in the setting of a recent history of Clostridium difficile colitis Active Problems:   Hypertensive heart disease   Type 2 diabetes mellitus with vascular disease   Benign prostatic hypertrophy   Squamous cell lung cancer left upper lobe   COPD (chronic obstructive pulmonary disease)   CAD (coronary artery disease), native coronary artery   Anemia of chronic disease   Leukocytosis   Rapid atrial fibrillation   Cellulitis of right lower extremity   Hypokalemia   Metabolic acidosis    Hypotension, unspecified   Acute respiratory failure   Acute diastolic CHF (congestive heart failure)   Volume overload   Radiation pneumonitis   Amiodarone pulmonary toxicity    Signed, Elyn Aquas. MD,FACC 09/08/2012 7:50 AM

## 2012-09-08 NOTE — Progress Notes (Signed)
Phoned [facility] regarding low B/P, Dr. Vassie Loll advised that the pt had been diuresing and to just keep an eye , at this time a bolus or increase in fluid is not needed.

## 2012-09-08 NOTE — Progress Notes (Signed)
OT Cancellation Note  Patient Details Name: Brandon Clay MRN: [id number] DOB: 10-Jun-1934   Cancelled Treatment:    Reason Eval/Treat Not Completed: Medical issues which prohibited therapy  Pt with low BP and on NRB due to desaturation.  Will check anther day.    Jayleena Stille 09/08/2012, 11:58 AM Marica Otter, OTR/L [phone] 09/08/2012

## 2012-09-09 ENCOUNTER — Inpatient Hospital Stay (HOSPITAL_COMMUNITY): Payer: Medicare Other

## 2012-09-09 LAB — PROTIME-INR
INR: 2.51 — ABNORMAL HIGH (ref 0.00–1.49)
Prothrombin Time: 25.9 seconds — ABNORMAL HIGH (ref 11.6–15.2)

## 2012-09-09 LAB — PROCALCITONIN: Procalcitonin: 0.65 ng/mL

## 2012-09-09 LAB — CBC
HCT: 22.4 % — ABNORMAL LOW (ref 39.0–52.0)
Hemoglobin: 7.4 g/dL — ABNORMAL LOW (ref 13.0–17.0)
MCH: 25.9 pg — ABNORMAL LOW (ref 26.0–34.0)
MCHC: 33 g/dL (ref 30.0–36.0)
MCV: 78.3 fL (ref 78.0–100.0)
Platelets: 222 10*3/uL (ref 150–400)
RBC: 2.86 MIL/uL — ABNORMAL LOW (ref 4.22–5.81)
RDW: 17.6 % — ABNORMAL HIGH (ref 11.5–15.5)
WBC: 6.8 10*3/uL (ref 4.0–10.5)

## 2012-09-09 LAB — BASIC METABOLIC PANEL
BUN: 54 mg/dL — ABNORMAL HIGH (ref 6–23)
CO2: 25 mEq/L (ref 19–32)
Calcium: 7.6 mg/dL — ABNORMAL LOW (ref 8.4–10.5)
Chloride: 99 mEq/L (ref 96–112)
Creatinine, Ser: 1.19 mg/dL (ref 0.50–1.35)
GFR calc Af Amer: 67 mL/min — ABNORMAL LOW (ref >90)
GFR calc non Af Amer: 58 mL/min — ABNORMAL LOW (ref >90)
Glucose, Bld: 258 mg/dL — ABNORMAL HIGH (ref 70–99)
Potassium: 4.3 mEq/L (ref 3.5–5.1)
Sodium: 131 mEq/L — ABNORMAL LOW (ref 135–145)

## 2012-09-09 LAB — APTT: aPTT: 35 seconds (ref 24–37)

## 2012-09-09 LAB — MAGNESIUM: Magnesium: 1.8 mg/dL (ref 1.5–2.5)

## 2012-09-09 LAB — PHOSPHORUS: Phosphorus: 2.7 mg/dL (ref 2.3–4.6)

## 2012-09-09 LAB — GLUCOSE, CAPILLARY: Glucose-Capillary: 366 mg/dL — ABNORMAL HIGH (ref 70–99)

## 2012-09-09 MED ORDER — FLUCONAZOLE 100MG IVPB
100.0000 mg | INTRAVENOUS | Status: DC
  Administered 2012-09-09 – 2012-09-13 (×5): 100 mg via INTRAVENOUS
  Filled 2012-09-09 (×6): qty 50

## 2012-09-09 MED ORDER — INSULIN ASPART 100 UNIT/ML ~~LOC~~ SOLN
8.0000 [IU] | Freq: Three times a day (TID) | SUBCUTANEOUS | Status: DC
  Administered 2012-09-09 – 2012-09-10 (×5): 8 [IU] via SUBCUTANEOUS

## 2012-09-09 MED ORDER — INSULIN ASPART 100 UNIT/ML ~~LOC~~ SOLN
0.0000 [IU] | Freq: Three times a day (TID) | SUBCUTANEOUS | Status: DC
  Administered 2012-09-09: 20 [IU] via SUBCUTANEOUS
  Administered 2012-09-09: 7 [IU] via SUBCUTANEOUS
  Administered 2012-09-09 – 2012-09-10 (×2): 4 [IU] via SUBCUTANEOUS
  Administered 2012-09-10: 11 [IU] via SUBCUTANEOUS
  Administered 2012-09-10 – 2012-09-11 (×2): 3 [IU] via SUBCUTANEOUS
  Administered 2012-09-12: 11 [IU] via SUBCUTANEOUS
  Administered 2012-09-12: 15 [IU] via SUBCUTANEOUS
  Administered 2012-09-12: 7 [IU] via SUBCUTANEOUS
  Administered 2012-09-13: 20 [IU] via SUBCUTANEOUS
  Administered 2012-09-13 (×2): 15 [IU] via SUBCUTANEOUS
  Administered 2012-09-14: 7 [IU] via SUBCUTANEOUS
  Administered 2012-09-14: 4 [IU] via SUBCUTANEOUS
  Administered 2012-09-15 (×2): 7 [IU] via SUBCUTANEOUS
  Administered 2012-09-15: 3 [IU] via SUBCUTANEOUS
  Administered 2012-09-16: 11 [IU] via SUBCUTANEOUS
  Administered 2012-09-16: 7 [IU] via SUBCUTANEOUS
  Administered 2012-09-16 – 2012-09-17 (×2): 4 [IU] via SUBCUTANEOUS
  Administered 2012-09-17: 11 [IU] via SUBCUTANEOUS

## 2012-09-09 MED ORDER — INSULIN ASPART 100 UNIT/ML ~~LOC~~ SOLN
0.0000 [IU] | Freq: Every day | SUBCUTANEOUS | Status: DC
  Administered 2012-09-09: 2 [IU] via SUBCUTANEOUS
  Administered 2012-09-12: 5 [IU] via SUBCUTANEOUS
  Administered 2012-09-13 – 2012-09-16 (×2): 3 [IU] via SUBCUTANEOUS

## 2012-09-09 MED ORDER — INSULIN DETEMIR 100 UNIT/ML ~~LOC~~ SOLN
30.0000 [IU] | Freq: Every day | SUBCUTANEOUS | Status: DC
  Administered 2012-09-09 – 2012-09-10 (×2): 30 [IU] via SUBCUTANEOUS
  Filled 2012-09-09 (×2): qty 0.3

## 2012-09-09 NOTE — Progress Notes (Signed)
PULMONARY  / CRITICAL CARE MEDICINE  Name: Brandon Clay MRN: [id number] DOB: February 14, 1935    ADMISSION DATE:  09/02/2012 CONSULTATION DATE:  09/07/12  REFERRING MD :  [facility] PRIMARY SERVICE: TRH  CHIEF COMPLAINT:  dyspnea  BRIEF PATIENT DESCRIPTION:  78 yo with COPD, recently diagnosed SCLCA currently undergoing SBRT, AF recently treated with Amiodarone and CAD with ischemic cardiomyopathy who developed progressive pulmonary infiltrates, hypoxemia and respiratory distress requiring transfer to ICU. Recent C-Diff Colitis on oral Vanco. PCCM was asked to evaluate.    SIGNIFICANT EVENTS / STUDIES:  3/27 - Admitted from radiation therapy (#4/5) with weakness/dizzy, & hypoxemia / resp distress 4/01 - Transferred to ICU with hypoxia / respiratory distress  4/02 - Afib with CVR, bp soft 4/03 - soft BP, Afib with increasing rate  LINES / TUBES: PIV  CULTURES: 3/28 C. Dif PCR >>> POS  4/1 Urine >>>large leukocytes, 21-50 WBC, neg nitrate 4/1 UC>>>100k yeast 4/1 MRSA PCR >>> neg   ANTIBIOTICS: Flagyl 3/27 >>>  Vancomycin PO 3/27 >>>  Cefepime 4/1 >>>  Levaquin 4/1 >>>  Vancomycin IV 4/1 >>> Diflucan 4/3>>>  SUBJECTIVE: Patient denies shortness of breath, sputum production.  RN reports soft BP with increasing rate (Afib)   VITAL SIGNS: Temp:  [96.6 F (35.9 C)-98.2 F (36.8 C)] 97.5 F (36.4 C) (04/03 0800) Pulse Rate:  [97-122] 106 (04/03 0700) Resp:  [16-23] 17 (04/03 0700) BP: (70-114)/(38-69) 96/58 mmHg (04/03 0700) SpO2:  [87 %-96 %] 92 % (04/03 0700) FiO2 (%):  [40 %] 40 % (04/02 1200) Weight:  [192 lb 10.9 oz (87.4 kg)] 192 lb 10.9 oz (87.4 kg) (04/03 0400)  HEMODYNAMICS:    VENTILATOR SETTINGS: Vent Mode:  [-]  FiO2 (%):  [40 %] 40 % On VM oxygen  INTAKE / OUTPUT: Intake/Output     04/02 0701 - 04/03 0700 04/03 0701 - 04/04 0700   P.O. 600    I.V. (mL/kg) 290 (3.3)    IV Piggyback 562.5    Total Intake(mL/kg) 1452.5 (16.6)    Urine (mL/kg/hr) 620 (0.3)     Total Output 620     Net +832.5          Stool Occurrence 1 x      PHYSICAL EXAMINATION: General:  Ill appearing  Neuro:  awake HEENT:  Dry mucus membranes Cardiovascular:  RRR Lungs:  resp's even/non-labored, soft crackles Abdomen:  Soft nt Musculoskeletal:  From, no acute deformities Skin:  Clear, no rashes / lesions, no edema  LABS:  Recent Labs Lab 09/02/12 1052 09/02/12 1515  09/02/12 1520 09/03/12 0341  09/04/12 0801  09/05/12 0731  09/07/12 0500 09/07/12 1520 09/07/12 2103 09/08/12 0335 09/09/12 0340  HGB 8.0*  --   < > 8.8* 8.3*  < >  --   < >  --   < > 7.3*  --   --  7.7* 7.4*  WBC 32.7*  --   < > 31.9* 29.4*  < >  --   < >  --   < > 11.9*  --   --  8.4 6.8  PLT 242  --   < > 283 263  < >  --   < >  --   < > 255  --   --  229 222  NA 136  --   < > 134* 135  < >  --   < >  --   < > 134*  --   --  132*  131*  K 3.6  --   < > 3.6 3.0*  < >  --   < >  --   < > 4.0  --   --  4.0 4.3  CL 105  --   < > 98 103  < >  --   < >  --   < > 102  --   --  101 99  CO2 19*  --   < > 20 19  < >  --   < >  --   < > 23  --   --  26 25  GLUCOSE 114*  --   < > 126* 47*  < >  --   < >  --   < > 136*  --   --  110* 258*  BUN 24.2  --   < > 24* 24*  < >  --   < >  --   < > 45*  --   --  43* 54*  CREATININE 1.2  --   < > 1.15 1.10  < >  --   < >  --   < > 1.13  --   --  1.19 1.19  CALCIUM 7.8*  --   < > 8.2* 7.6*  < >  --   < >  --   < > 7.5*  --   --  7.5* 7.6*  MG  --   --   --  1.2*  --   --   --   --   --   --   --   --   --   --  1.8  PHOS  --  2.7  --  2.6  --   --   --   --   --   --   --   --   --   --  2.7  AST 8  --   --  12 12  --   --   --   --   --   --   --   --   --   --   ALT 6  --   --  7 7  --   --   --   --   --   --   --   --   --   --   ALKPHOS 102  --   --  115 102  --   --   --   --   --   --   --   --   --   --   BILITOT 0.54  --   --  0.4 0.3  --   --   --   --   --   --   --   --   --   --   PROT 5.4*  --   --  5.8* 5.1*  --   --   --   --   --   --   --    --   --   --   ALBUMIN 2.1*  --   --  2.4* 2.0*  --   --   --   --   --   --   --   --   --   --   APTT  --   --   --  58*  --   --   --   --   --   --   --   --   --   --  35  INR  --   --   < > 3.34* 4.35*  --   --   < >  --   < > 1.81*  --   --  2.24* 2.51*  LATICACIDVEN  --   --   --   --   --   --  1.2  --   --   --   --   --   --   --   --   PROCALCITON  --   --   --   --   --   --   --   --   --   --   --   --  1.36 1.21 0.65  PROBNP  --   --   --   --   --   --   --   --  16109.6*  --  15589.0*  --   --  12670.0*  --   PHART  --   --   --   --   --   --   --   --   --   --   --  7.517*  --   --   --   PCO2ART  --   --   --   --   --   --   --   --   --   --   --  29.1*  --   --   --   PO2ART  --   --   --   --   --   --   --   --   --   --   --  51.0*  --   --   --   < > = values in this interval not displayed.  Recent Labs Lab 09/08/12 1157 09/08/12 1552 09/08/12 1957 09/08/12 2137 09/09/12 0751  GLUCAP 202* 289* 356* 329* 228*    Imaging: Dg Chest Port 1 View  09/09/2012  *RADIOLOGY REPORT*  Clinical Data: Airspace disease.  PORTABLE CHEST - 1 VIEW  Comparison: 09/07/2012  Findings: Diffuse parenchymal airspace disease in the lungs suggesting edema, any RDS, or pneumonia.  The heart size appears enlarged although the parenchymal process obscures the heart borders.  Shallow inspiration.  No definite pneumothorax or effusion.  Aeration of the lungs appears similar to previous study, allowing for technical differences.  IMPRESSION: Diffuse bilateral airspace disease again demonstrated in the lungs.   Original Report Authenticated By: Burman Nieves, M.D.    Dg Chest Port 1 View  09/07/2012  *RADIOLOGY REPORT*  Clinical Data: Worsening dyspnea  PORTABLE CHEST - 1 VIEW  Comparison: 09/04/2012  Findings: Cardiomediastinal silhouette is stable.  Again noted bilateral diffuse airspace disease which may be due to bilateral pulmonary edema or bilateral pneumonia.  Small left pleural  effusion.  IMPRESSION:  Again noted bilateral diffuse airspace disease which may be due to bilateral pulmonary edema or bilateral pneumonia.  Small left pleural effusion.   Original Report Authenticated By: Natasha Mead, M.D.     ASSESSMENT / PLAN:   PULMONARY A: Acute hypoxemic resp failure with bilat infiltrates, AECOPD - Poss XRT pneumonitis, lung CA, HCap?, amiod toxic?  P:   Monitor respiratory status closely, high risk for intubation Steroids Hold diuresis BDs   CARDIOVASCULAR A:  Ischemic CM Mild Hypotension Afib - chronic, on coumadin, RVR 4/3  P:  Hold bp meds Per cardiology Consider restart cardizem 4/3 at reduced dose (was on 180  Q24 at home) Hold lasix Coumadin per pharmacy   Canary Brim, NP-C  Pulmonary & Critical Care Pgr: [phone] or [phone]  Reviewed above, examined pt, and agree with assessment/plan.  Clinically looks better than what would be expected from CXR.  Continue Abx and solumedrol.  Goal even fluid balance.  F/u CXR 4/04.  Rate control for a fib per cardiology >> avoid amiodarone.  Coralyn Helling, MD [facility] Pulmonary/Critical Care 09/09/2012, 11:52 AM Pager:  [phone] After 3pm call: [phone]

## 2012-09-09 NOTE — Progress Notes (Signed)
ANTICOAGULATION CONSULT NOTE - Follow Up  Pharmacy Consult for Coumadin Indication: atrial fibrillation  No Known Allergies  Labs:  Recent Labs  09/07/12 0500 09/08/12 0335 09/09/12 0340  HGB 7.3* 7.7* 7.4*  HCT 21.7* 23.2* 22.4*  PLT 255 229 222  APTT  --   --  35  LABPROT 20.3* 23.8* 25.9*  INR 1.81* 2.24* 2.51*  CREATININE 1.13 1.Brandon 1.Brandon    Estimated Creatinine Clearance: 55.4 ml/min (by C-G formula based on Cr of 1.Brandon).   Assessment: 78 yo Clay on chronic Coumadin 4mg /day for h/o atrial fibrillation. Coumadin held prior to admission due to supratherapeutic INR, resumed 3/30.   INR therapeutic, still small rise overnight despite no coumadin for the last 2 days  H/H have slowly trended down, stable overnight, no bleeding reported.   Will hold coumadin again tonight as INR continues to rise, despite held doses which is similar to initial INR pattern on admission.  Also, pt on flagyl which can cause elevations in INR.  Goal of Therapy:  INR 2-3 Monitor platelets by anticoagulation protocol: Yes   Plan:   Hold coumadin tonight  Daily PT/INR  Pharmacy will f/u  Haynes Hoehn, PharmD 09/09/2012 10:56 AM  Pager: [phone]

## 2012-09-09 NOTE — Progress Notes (Signed)
Physical Therapy Treatment Patient Details Name: Brandon Clay MRN: [id number] DOB: Sep 18, 1934 Today's Date: 09/09/2012 Time: 4098-1191 PT Time Calculation (min): 14 min  PT Assessment / Plan / Recommendation Comments on Treatment Session  Pt. was cooperative and willing to get up. Pt. appears weaker today but was able to get up and take a few steps. HR 140. sats on 5 l >90%.    Follow Up Recommendations  SNF     Does the patient have the potential to tolerate intense rehabilitation     Barriers to Discharge        Equipment Recommendations  Rolling walker with 5" wheels;Wheelchair (measurements PT);Wheelchair cushion (measurements PT)    Recommendations for Other Services    Frequency Min 3X/week   Plan Discharge plan remains appropriate;Frequency remains appropriate    Precautions / Restrictions Precautions Precautions: Fall Precaution Comments: O2 dependent   Pertinent Vitals/Pain HR in 130-140. RN in room and aware.    Mobility  Bed Mobility Supine to Sit: 3: Mod assist;HOB elevated;With rails Details for Bed Mobility Assistance: assistance for getting trunk upright. Transfers Transfers: Stand Pivot Transfers Sit to Stand: 1: +2 Total assist;From bed Sit to Stand: Patient Percentage: 60% Stand to Sit: 3: Mod assist Details for Transfer Assistance: VCS safety, technique, hand placement. Assist to rise, stabilize, control descnet Ambulation/Gait Ambulation/Gait Assistance: 1: +2 Total assist Ambulation/Gait: Patient Percentage: 60% Ambulation Distance (Feet): 4 Feet Assistive device: Rolling walker Ambulation/Gait Assistance Details: VC's for safety, close guarding of knees to prevet buckling as pt. is very weak. Gait Pattern: Step-to pattern    Exercises General Exercises - Lower Extremity Ankle Circles/Pumps: AROM;Both;10 reps Quad Sets: AROM;10 reps;Both   PT Diagnosis:    PT Problem List:   PT Treatment Interventions:     PT Goals Acute Rehab PT  Goals Pt will go Supine/Side to Sit: with supervision PT Goal: Supine/Side to Sit - Progress: Progressing toward goal Pt will go Sit to Stand: with min assist PT Goal: Sit to Stand - Progress: Progressing toward goal Pt will Transfer Bed to Chair/Chair to Bed: with min assist PT Transfer Goal: Bed to Chair/Chair to Bed - Progress: Progressing toward goal  Visit Information  Last PT Received On: 09/09/12 Assistance Needed: +2    Subjective Data  Subjective: I need to get up.   Cognition  Cognition Overall Cognitive Status: Appears within functional limits for tasks assessed/performed    Balance     End of Session PT - End of Session Equipment Utilized During Treatment: Oxygen Activity Tolerance: Patient limited by fatigue Patient left: with call bell/phone within reach;with family/visitor present;in chair Nurse Communication: Mobility status   GP     Rada Hay 09/09/2012, 1:59 PM Blanchard Kelch PT [phone]

## 2012-09-09 NOTE — Progress Notes (Signed)
NUTRITION FOLLOW UP  Intervention:   Continue Glucerna BID  Continue Multivitamin with minerals daily  Encourage PO intake >75% of meals Discontinue Juven BID  Nutrition Dx:   Inadequate oral intake related to decreased appetite as evidenced by pt report of only eating a few bites of food today; improving  Goal:   Pt to meet >/= 90% of their estimated nutrition needs; likely being met   Monitor:   Wt; 26 lb wt gain from 3/28 to 4/3 (question accuracy of weights) PO intake; Good- 75-100% of meals Wounds; none recorded Labs; high glucose  Assessment:   78 yo male with multiple and complex medical history including 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 difficile colitis requiring vancomycin by mouth, atrial fibrillation on Coumadin, CAD, recent admission to [facility] in 07/2012 for abdominal pain, vomiting and diarrhea who was sent from radiation due to to generalized weakness, vomiting and diarrhea. Patient was receiving stereotactic body radiotherapy (#4 / 5 planned treatments) and was noticed to be very weak and dizzy upon standing up. Patient also reported having nausea, vomiting and diarrhea. Per patient he has not been feeling well for past one week prior to this admission. He does not exactly recall how many episodes of diarrhea he has had but reports a last one was this morning. Pt also has history of COPD and type 2 diabetes.  Pt reports that his appetite has improved and he is eating 75-100% of meals but, pt reports having missed some meals due to not being hungry. No wt loss. Pt states he has been drinking the Glucerna shakes daily and plans to continue intake at home after discharge. Pt has no further questions or concerns.    Height: Ht Readings from Last 1 Encounters:  09/07/12 5\' 10"  (1.778 m)    Weight Status:   Wt Readings from Last 1 Encounters:  09/09/12 192 lb 10.9 oz (87.4 kg)    Re-estimated needs:  Kcal: 2175-2370  Protein: 114-128 grams   Fluid: 2.0- 2.3 L  Skin: +1 RLE edema and LLE edema; ecchymosis on arms  Diet Order: Carb Control   Intake/Output Summary (Last 24 hours) at 09/09/12 1458 Last data filed at 09/09/12 1300  Gross per 24 hour  Intake 1562.5 ml  Output    340 ml  Net 1222.5 ml    Last BM: 4/3   Labs:   Recent Labs Lab 09/02/12 1515 09/02/12 1520  09/07/12 0500 09/08/12 0335 09/09/12 0340  NA  --  134*  < > 134* 132* 131*  K  --  3.6  < > 4.0 4.0 4.3  CL  --  98  < > 102 101 99  CO2  --  20  < > 23 26 25   BUN  --  24*  < > 45* 43* 54*  CREATININE  --  1.15  < > 1.13 1.19 1.19  CALCIUM  --  8.2*  < > 7.5* 7.5* 7.6*  MG  --  1.2*  --   --   --  1.8  PHOS 2.7 2.6  --   --   --  2.7  GLUCOSE  --  126*  < > 136* 110* 258*  < > = values in this interval not displayed.  CBG (last 3)   Recent Labs  09/08/12 2137 09/09/12 0751 09/09/12 1144  GLUCAP 329* 228* 366*    Scheduled Meds: . aspirin EC  81 mg Oral QODAY  . budesonide-formoterol  2 puff Inhalation BID  . ceFEPime (MAXIPIME) IV  1 g Intravenous Q12H  . feeding supplement  237 mL Oral BID BM  . fluconazole (DIFLUCAN) IV  100 mg Intravenous Q24H  . furosemide  40 mg Intravenous Once  . insulin aspart  0-20 Units Subcutaneous TID WC  . insulin aspart  0-5 Units Subcutaneous QHS  . insulin aspart  8 Units Subcutaneous TID WC  . insulin detemir  30 Units Subcutaneous QHS  . levofloxacin (LEVAQUIN) IV  500 mg Intravenous Q24H  . methylPREDNISolone (SOLU-MEDROL) injection  80 mg Intravenous Q8H  . metronidazole  500 mg Intravenous Q8H  . multivitamin with minerals  1 tablet Oral Daily  . nutrition supplement  1 packet Oral BID PC  . sodium chloride  500 mL Intravenous Once  . sodium chloride  3 mL Intravenous Q12H  . vancomycin  125 mg Oral Q6H  . vancomycin  750 mg Intravenous Q12H  . Warfarin - Pharmacist Dosing Inpatient   Does not apply q1800    Continuous Infusions: . sodium chloride 10 mL/hr at 09/05/12 1900  .  sodium chloride      Ian Malkin RD, LDN Inpatient Clinical Dietitian Pager: [phone] After Hours Pager: [phone]

## 2012-09-09 NOTE — Progress Notes (Signed)
[facility] PROGRESS NOTE  Brandon Clay OZH:[id number] DOB: 27-Jun-1934 DOA: 09/02/2012 PCP: [name], MD  Brief narrative: Brandon Clay is an 78 y.o. 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 difficile colitis requiring PO vancomycin, atrial fibrillation on chronic Coumadin, 3 vessel CAD with chronic occlusion of RCA, severe stenosis in proximal LCx & moderately severe disease in mid LAD, S/P PTCA, on ASA and Brilinta (Plavix nonresponder), recent admission 07/24/12-08/02/12 for treatment of C. Diff who was sent to the hospital from radiation for evaluation of generalized weakness, vomiting and diarrhea. Patient was receiving stereotactic body radiotherapy (#4 / 5 planned treatments) and was noticed to be very weak and dizzy upon standing up.  On admission, he was was noted to be in atrial fibrillation with heart rates in 150s. He also had significant leukocytosis of 32.7. He is currently being treated for recurrent Clostridium difficile infection as well as right lower extremity cellulitis.  After initially showing some improvement in his clinical status, his condition deteriorated on 09/07/12 requiring transfer back to the ICU for acute respiratory failure.  PCCM was consulting and is assisting with management. The patient's clinical status has improved with empiric antibiotics and steroids.  Assessment/Plan: Principal Problem:  Generalized weakness in the setting of nausea, vomiting, diarrhea, dehydration / Recurrent Clostridium difficile colitis -Likely from recurrent C. difficile colitis.  C. difficile PCR positive. N/V resolved. Diarrhea improved.  WBC normalized.  -Remains on oral vancomycin and IV Flagyl.   -Replace electrolytes as needed.  -Continue antiemetics as needed. -Status post physical and occupational therapy evaluations: SNF versus 24 hour supervision recommended. Active Problems: Acute respiratory failure with dyspnea /  hypoxia secondary to acute diastolic CHF and volume overload / R/O HCAP and radiation pneumonitis -Diuresed. -Pro BNP trending down 25,958 09/05/12--->12,670 09/08/12. -Chest x-ray done 09/07/2012: Persistent opacities worrisome for HCAP.  Empiric Levaquin and cefepime (already on vancomycin) started 09/08/2012. -Empiric steroids started by PCCM for radiation pneumonitis. Supratherapeutic INR -Given 0.5 mg of vitamin K 09/04/2012. -Coumadin dosing per pharmacy. Metabolic acidosis -Likely from bicarbonate losses in the stool. Corrected.   Right lower extremity cellulitis -Small fluctuant area noted RLE.  -Continue IV vancomycin, area improving with no current indication for debridement. Atrial fibrillation with RVR  -Known history of atrial fibrillation, on Coumadin. Goal INR 2-3. -Initially treated with Cardizem drip and metoprolol. Cardizem drip discontinued secondary to low blood pressure and given digoxin overnight on 09/02/2012.  Now off Amiodarone drip. -Heart rate improved 90s-low 100s.  Avoid further amiodarone per PCCM recommendations.  Use digoxin and IV Cardizem, if needed per cardiology recs. Leukocytosis  -WBC normalized 09/08/12. H/O Hypertension / Hypotension  -Blood pressure currently well-controlled (and on low side). Hypercholesterolemia  -History of statin intolerance. 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 CBGs still running high despite up titration of insulin.  CBGs 111-356. -Metformin currently on hold.  Continue Levemir 30 units at bedtime and continue sliding scale insulin before meals and bedtime, but change to resistant scale, and increase meal coverage to 8 units of NovoLog Q AC. Squamous cell lung cancer left upper lobe  -Completed stereotactic body radiation 09/07/12. -Not a candidate for lung resection.   COPD (chronic obstructive pulmonary disease)  -Continue Symbicort.  No wheeze.  Empiric steroids started by PCCM on  09/07/12. CAD (coronary artery disease), native coronary artery   -On outpatient Brilinta and aspirin.  -Now off Brilinta per cardiology recommendations. Anemia  of chronic disease  -Secondary to history multiple medical problems including squamous cell lung cancer.  -No signs of active bleed. Hemoglobin stable with no indication for transfusion. Benign prostatic hypertrophy  -Continue Finasteride and Flomax.  Code Status: Full  Family Communication: Wife and daughter updated at bedside 09/08/12. Disposition Plan: Home versus SNF depending on progress when stable.   Medical Consultants:  Dr. Olga Millers, Cardiology.  Dr. Lonia Farber, PCCM  Other Consultants:  Physical therapy: Skilled nursing home placement versus home with 24-hour supervision.  Anti-infectives:  Vancomycin 09/02/2012--->  Flagyl 09/02/2012---->  IV Vancomycin 09/03/12--->  Cefepime 09/07/12--->  Levaquin 09/07/12--->  HPI/Subjective: Brandon Clay is more awake and alert. He feels well and is requesting to be discharged home. No dyspnea. His work of breathing is markedly better. Appetite is good.  Objective: Filed Vitals:   09/09/12 0300 09/09/12 0400 09/09/12 0500 09/09/12 0600  BP: 90/51 97/63 99/64  103/49  Pulse: 98 102 107 108  Temp:  98.2 F (36.8 C)    TempSrc:  Oral    Resp: 16 18 20 18   Height:      Weight:  87.4 kg (192 lb 10.9 oz)    SpO2: 92% 94% 92% 92%    Intake/Output Summary (Last 24 hours) at 09/09/12 1610 Last data filed at 09/09/12 0600  Gross per 24 hour  Intake 1452.5 ml  Output    620 ml  Net  832.5 ml    Exam: Gen:  NAD, non-toxic appearing Cardiovascular:  HSIR, tachy Respiratory:  Lungs mostly clear, appears comfortable with no increased work of breathing Gastrointestinal:  Abdomen soft, NT/ND, + BS Extremities:  1+ edema, right lower extremity focal area of cellulitis resolving.  Data Reviewed: Basic Metabolic Panel:  Recent Labs Lab  09/02/12 1515 09/02/12 1520  09/05/12 0351 09/06/12 0756 09/07/12 0500 09/08/12 0335 09/09/12 0340  NA  --  134*  < > 130* 131* 134* 132* 131*  K  --  3.6  < > 4.4 4.3 4.0 4.0 4.3  CL  --  98  < > 99 100 102 101 99  CO2  --  20  < > 18* 20 23 26 25   GLUCOSE  --  126*  < > 252* 187* 136* 110* 258*  BUN  --  24*  < > 29* 40* 45* 43* 54*  CREATININE  --  1.15  < > 1.14 1.14 1.13 1.19 1.19  CALCIUM  --  8.2*  < > 7.2* 7.5* 7.5* 7.5* 7.6*  MG  --  1.2*  --   --   --   --   --  1.8  PHOS 2.7 2.6  --   --   --   --   --  2.7  < > = values in this interval not displayed. GFR Estimated Creatinine Clearance: 55.4 ml/min (by C-G formula based on Cr of 1.19). Liver Function Tests:  Recent Labs Lab 09/02/12 1052 09/02/12 1520 09/03/12 0341  AST 8 12 12   ALT 6 7 7   ALKPHOS 102 115 102  BILITOT 0.54 0.4 0.3  PROT 5.4* 5.8* 5.1*  ALBUMIN 2.1* 2.4* 2.0*   Coagulation profile  Recent Labs Lab 09/05/12 0351 09/06/12 0343 09/07/12 0500 09/08/12 0335 09/09/12 0340  INR 1.96* 1.38 1.81* 2.24* 2.51*    CBC:  Recent Labs Lab 09/02/12 1052 09/02/12 1520  09/05/12 0351 09/06/12 0756 09/07/12 0500 09/08/12 0335 09/09/12 0340  WBC 32.7* 31.9*  < > 18.8* 17.5* 11.9* 8.4 6.8  NEUTROABS 30.7* 29.8*  --   --   --   --   --   --   HGB 8.0* 8.8*  < > 8.4* 8.6* 7.3* 7.7* 7.4*  HCT 25.2* 26.5*  < > 25.3* 25.9* 21.7* 23.2* 22.4*  MCV 80.0 80.8  < > 79.6 78.7 78.1 78.6 78.3  PLT 242 283  < > 247 282 255 229 222  < > = values in this interval not displayed. BNP (last 3 results)  Recent Labs  09/05/12 0731 09/07/12 0500 09/08/12 0335  PROBNP 25958.0* 15589.0* 12670.0*   CBG:  Recent Labs Lab 09/08/12 0748 09/08/12 1157 09/08/12 1552 09/08/12 1957 09/08/12 2137  GLUCAP 111* 202* 289* 356* 329*   Microbiology Recent Results (from the past 240 hour(s))  MRSA PCR SCREENING     Status: None   Collection Time    09/02/12 10:21 PM      Result Value Range Status   MRSA by  PCR NEGATIVE  NEGATIVE Final   Comment:            The GeneXpert MRSA Assay (FDA     approved for NASAL specimens     only), is one component of a     comprehensive MRSA colonization     surveillance program. It is not     intended to diagnose MRSA     infection nor to guide or     monitor treatment for     MRSA infections.  CLOSTRIDIUM DIFFICILE BY PCR     Status: Abnormal   Collection Time    09/03/12 11:27 PM      Result Value Range Status   C difficile by pcr POSITIVE (*) NEGATIVE Final   Comment: CRITICAL RESULT CALLED TO, READ BACK BY AND VERIFIED WITH:     RICHARDSON,N RN 09/03/12 1410 WOOTEN,K  URINE CULTURE     Status: None   Collection Time    09/07/12  4:26 PM      Result Value Range Status   Specimen Description URINE, CLEAN CATCH   Final   Special Requests NONE   Final   Culture  Setup Time 09/07/2012 21:17   Final   Colony Count >=100,000 COLONIES/ML   Final   Culture YEAST   Final   Report Status 09/08/2012 FINAL   Final  MRSA PCR SCREENING     Status: None   Collection Time    09/07/12  6:46 PM      Result Value Range Status   MRSA by PCR NEGATIVE  NEGATIVE Final   Comment:            The GeneXpert MRSA Assay (FDA     approved for NASAL specimens     only), is one component of a     comprehensive MRSA colonization     surveillance program. It is not     intended to diagnose MRSA     infection nor to guide or     monitor treatment for     MRSA infections.     Procedures and Diagnostic Studies:  Dg Abd 1 View 09/04/2012 IMPRESSION: No evidence of bowel obstruction.  Gas in mildly prominent right and transverse colon.   Original Report Authenticated By: Charlett Nose, M.D.     Dg Chest Port 1 View 09/04/2012 IMPRESSION: Worsening bilateral airspace opacities, edema versus infection.  Stable left upper lobe mass.  Small bilateral effusions.   Original Report Authenticated By: Charlett Nose, M.D.  Dg Chest Port 1 View 09/07/2012  IMPRESSION:  Again noted  bilateral diffuse airspace disease which may be due to bilateral pulmonary edema or bilateral pneumonia.  Small left pleural effusion.   Original Report Authenticated By: Natasha Mead, M.D.     Dg Chest Port 1 View 09/09/2012  IMPRESSION: Diffuse bilateral airspace disease again demonstrated in the lungs.   Original Report Authenticated By: Burman Nieves, M.D.     Scheduled Meds: . aspirin EC  81 mg Oral QODAY  . budesonide-formoterol  2 puff Inhalation BID  . ceFEPime (MAXIPIME) IV  1 g Intravenous Q12H  . feeding supplement  237 mL Oral BID BM  . furosemide  40 mg Intravenous Once  . insulin aspart  0-15 Units Subcutaneous TID WC  . insulin aspart  0-5 Units Subcutaneous QHS  . insulin aspart  7 Units Subcutaneous TID WC  . insulin detemir  30 Units Subcutaneous QHS  . levofloxacin (LEVAQUIN) IV  500 mg Intravenous Q24H  . methylPREDNISolone (SOLU-MEDROL) injection  80 mg Intravenous Q8H  . metronidazole  500 mg Intravenous Q8H  . multivitamin with minerals  1 tablet Oral Daily  . nutrition supplement  1 packet Oral BID PC  . sodium chloride  500 mL Intravenous Once  . sodium chloride  3 mL Intravenous Q12H  . vancomycin  125 mg Oral Q6H  . vancomycin  750 mg Intravenous Q12H  . Warfarin - Pharmacist Dosing Inpatient   Does not apply q1800   Continuous Infusions: . sodium chloride 10 mL/hr at 09/05/12 1900  . sodium chloride      Time spent: 35 minutes.   LOS: 7 days   Alyxander Kollmann  [facility] Pager [phone].  If 8PM-8AM, please contact night-coverage at [email or web address], password [facility] 09/09/2012, 7:09 AM

## 2012-09-09 NOTE — Progress Notes (Signed)
Brandon Clay is an 78 y.o. 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 difficile colitis requiring PO vancomycin, atrial fibrillation on chronic Coumadin, 3 vessel CAD with chronic occlusion of RCA, severe stenosis in proximal LCx & moderately severe disease in mid LAD, S/P PTCA, on ASA and Brilinta (Plavix nonresponder), recent admission 07/24/12-08/02/12 for treatment of C. Diff who was sent to the hospital from radiation for evaluation of generalized weakness, vomiting and diarrhea. Patient was receiving stereotactic body radiotherapy (#4 / 5 planned treatments) and was noticed to be very weak and dizzy upon standing up. On admission, he was was noted to be in atrial fibrillation with heart rates in 150s. He also had significant leukocytosis of 32.7. He is currently being treated for recurrent Clostridium difficile infection as well as right lower extremity cellulitis, and over the course of his hospital stay, has shown gradual improvement in his clinical status.  Echo 07/25/12: Left ventricle: The cavity size was normal. There was mild to moderate concentric hypertrophy. Systolic function was normal. The estimated ejection fraction was in the range of 60% to 65%. Wall motion was normal; there were no regional wall motion abnormalities. - Ventricular septum: Mild septal flattening. - Left atrium: The atrium was mildly dilated. - Right atrium: The atrium was mildly dilated. - Atrial septum: No defect or patent foramen ovale was identified.   Pt was asleep this am and would not wake up.  Probably too sleepy to take meds   He had increasing work of breathing yesterday and was transferred to ICU.     Filed Vitals:   09/09/12 0300 09/09/12 0400 09/09/12 0500 09/09/12 0600  BP: 90/51 97/63 99/64  103/49  Pulse: 98 102 107 108  Temp:  98.2 F (36.8 C)    TempSrc:  Oral    Resp: 16 18 20 18   Height:      Weight:  192 lb 10.9 oz (87.4 kg)    SpO2:  92% 94% 92% 92%    Intake/Output Summary (Last 24 hours) at 09/09/12 0745 Last data filed at 09/09/12 0600  Gross per 24 hour  Intake 1452.5 ml  Output    620 ml  Net  832.5 ml    SUBJECTIVE No chest pain or dyspnea.  Heart rate improved today after addition of oral diltiazem yesterday. LABS: Basic Metabolic Panel:  Recent Labs  16/10/96 0335 09/09/12 0340  NA 132* 131*  K 4.0 4.3  CL 101 99  CO2 26 25  GLUCOSE 110* 258*  BUN 43* 54*  CREATININE 1.19 1.19  CALCIUM 7.5* 7.6*  MG  --  1.8  PHOS  --  2.7   Liver Function Tests: No results found for this basename: AST, ALT, ALKPHOS, BILITOT, PROT, ALBUMIN,  in the last 72 hours No results found for this basename: LIPASE, AMYLASE,  in the last 72 hours CBC:  Recent Labs  09/08/12 0335 09/09/12 0340  WBC 8.4 6.8  HGB 7.7* 7.4*  HCT 23.2* 22.4*  MCV 78.6 78.3  PLT 229 222   Hemoglobin A1C: No results found for this basename: HGBA1C,  in the last 72 hours Fasting Lipid Panel: No results found for this basename: CHOL, HDL, LDLCALC, TRIG, CHOLHDL, LDLDIRECT,  in the last 72 hours Thyroid Function Tests: No results found for this basename: TSH, T4TOTAL, FREET3, T3FREE, THYROIDAB,  in the last 72 hours Radiology/Studies:  Dg Chest Port 1 View  09/03/2012  *RADIOLOGY REPORT*  Clinical  Data: Pulmonary edema.  History of lung cancer.  PORTABLE CHEST - 1 VIEW  Comparison: PA and lateral chest 07/24/2012.  Findings: Left upper lobe mass is again seen measuring approximately 4.4 cm cranial-caudal, not markedly changed.  The lungs are emphysematous.  Bilateral alveolar and interstitial opacities have worsened.  There is cardiomegaly.  No pneumothorax or pleural fluid.  IMPRESSION:  1.  Bilateral alveolar and interstitial opacities superimposed on chronic change are likely due to pulmonary edema in this patient with cardiomegaly. Multi focal infection or interstitial tumor spread are felt less likely. 2.  Left upper lobe mass.    Original Report Authenticated By: Holley Dexter, M.D.     PHYSICAL EXAM BP 103/49  Pulse 108  Temp(Src) 98.2 F (36.8 C) (Oral)  Resp 18  Ht 5\' 10"  (1.778 m)  Wt 192 lb 10.9 oz (87.4 kg)  BMI 27.65 kg/m2  SpO2 92%  General: Elderly, chronically ill appearing, asleep Head: Normal Neck: Negative for carotid bruits. JVD not elevated. Lungs: clear anteriorly Heart: IRRR S1 S2 without murmurs, slightly tachycardic this am Abdomen: Soft, non-tender, non-distended with normoactive bowel sounds. No hepatomegaly. No rebound/guarding. No obvious abdominal masses. Extremities: trace edema.  Distal pedal pulses are 2+ and equal bilaterally. Neuro: nonfocal  TELEMETRY: Telemetry shows atrial fib with controlled ventricular response.  O2 sats - 95% at rest.   ASSESSMENT AND PLAN: 1. Atrial fibrillation with RVR.  Rate is ok.  He is mildly hypotensive -  Received IV NS bolus with some improvement.  HR slowing meds have been held.   PCCM may choose to hold Lasix this am.  Will leave this decision up to them.   2. CAD s/p BMS 07/12/12. Continue ASA 81 mg daily. 3. C. Diff colitis. 4. RLE cellulitis. 5. Lung CA. Poor candidate for resection. 6. DM 7. COPD.  - he is still hypoxic at rest.  PCCM to address. 8. Dyspnea - multifactorial - COPD, lung cancer, ? diastolic dysfunction ( he has normal LV systolic function_)  Principal Problem:   Generalized weakness with diarrhea, vomiting, and abdominal pain in the setting of a recent history of Clostridium difficile colitis Active Problems:   Hypertensive heart disease   Type 2 diabetes mellitus with vascular disease   Benign prostatic hypertrophy   Squamous cell lung cancer left upper lobe   COPD (chronic obstructive pulmonary disease)   CAD (coronary artery disease), native coronary artery   Anemia of chronic disease   Leukocytosis   Rapid atrial fibrillation   Cellulitis of right lower extremity   Hypokalemia   Metabolic acidosis    Hypotension, unspecified   Acute respiratory failure   Acute diastolic CHF (congestive heart failure)   Volume overload   Radiation pneumonitis   Amiodarone pulmonary toxicity   HCAP (healthcare-associated pneumonia)    Signed, Elyn Aquas. MD,FACC 09/09/2012 7:45 AM

## 2012-09-10 ENCOUNTER — Inpatient Hospital Stay (HOSPITAL_COMMUNITY): Payer: Medicare Other

## 2012-09-10 DIAGNOSIS — Z5189 Encounter for other specified aftercare: Secondary | ICD-10-CM

## 2012-09-10 LAB — BASIC METABOLIC PANEL
BUN: 57 mg/dL — ABNORMAL HIGH (ref 6–23)
CO2: 24 mEq/L (ref 19–32)
Chloride: 99 mEq/L (ref 96–112)
Glucose, Bld: 184 mg/dL — ABNORMAL HIGH (ref 70–99)
Potassium: 4.4 mEq/L (ref 3.5–5.1)

## 2012-09-10 LAB — GLUCOSE, CAPILLARY
Glucose-Capillary: 190 mg/dL — ABNORMAL HIGH (ref 70–99)
Glucose-Capillary: 204 mg/dL — ABNORMAL HIGH (ref 70–99)
Glucose-Capillary: 128 mg/dL — ABNORMAL HIGH (ref 70–99)
Glucose-Capillary: 275 mg/dL — ABNORMAL HIGH (ref 70–99)

## 2012-09-10 LAB — CBC
HCT: 22.5 % — ABNORMAL LOW (ref 39.0–52.0)
Hemoglobin: 7.6 g/dL — ABNORMAL LOW (ref 13.0–17.0)
MCH: 26.4 pg (ref 26.0–34.0)
MCHC: 33.8 g/dL (ref 30.0–36.0)
MCV: 78.1 fL (ref 78.0–100.0)
RBC: 2.88 MIL/uL — ABNORMAL LOW (ref 4.22–5.81)

## 2012-09-10 MED ORDER — VANCOMYCIN 50 MG/ML ORAL SOLUTION
125.0000 mg | Freq: Four times a day (QID) | ORAL | Status: DC
  Administered 2012-09-10 – 2012-09-17 (×29): 125 mg via ORAL
  Filled 2012-09-10 (×34): qty 2.5

## 2012-09-10 MED ORDER — WARFARIN 0.5 MG HALF TABLET
0.5000 mg | ORAL_TABLET | Freq: Once | ORAL | Status: AC
  Administered 2012-09-10: 0.5 mg via ORAL
  Filled 2012-09-10: qty 1

## 2012-09-10 MED ORDER — LEVOFLOXACIN 500 MG PO TABS
500.0000 mg | ORAL_TABLET | Freq: Every day | ORAL | Status: DC
  Administered 2012-09-10 – 2012-09-15 (×6): 500 mg via ORAL
  Filled 2012-09-10 (×7): qty 1

## 2012-09-10 MED ORDER — DIGOXIN 125 MCG PO TABS
0.1250 mg | ORAL_TABLET | Freq: Every day | ORAL | Status: DC
  Administered 2012-09-11 – 2012-09-17 (×7): 0.125 mg via ORAL
  Filled 2012-09-10 (×8): qty 1

## 2012-09-10 MED ORDER — ALBUTEROL SULFATE (5 MG/ML) 0.5% IN NEBU
2.5000 mg | INHALATION_SOLUTION | RESPIRATORY_TRACT | Status: DC | PRN
  Filled 2012-09-10: qty 0.5

## 2012-09-10 MED ORDER — PREDNISONE 50 MG PO TABS
60.0000 mg | ORAL_TABLET | Freq: Every day | ORAL | Status: DC
  Administered 2012-09-11 – 2012-09-13 (×3): 60 mg via ORAL
  Filled 2012-09-10 (×5): qty 1

## 2012-09-10 MED ORDER — METOPROLOL TARTRATE 25 MG PO TABS
25.0000 mg | ORAL_TABLET | Freq: Two times a day (BID) | ORAL | Status: DC
  Administered 2012-09-10 – 2012-09-14 (×10): 25 mg via ORAL
  Filled 2012-09-10 (×12): qty 1

## 2012-09-10 MED ORDER — DIGOXIN 0.25 MG/ML IJ SOLN
0.2500 mg | Freq: Four times a day (QID) | INTRAMUSCULAR | Status: AC
  Administered 2012-09-10 (×3): 0.25 mg via INTRAVENOUS
  Filled 2012-09-10 (×3): qty 1

## 2012-09-10 NOTE — Progress Notes (Signed)
Brandon Clay is an 78 y.o. 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 difficile colitis requiring PO vancomycin, atrial fibrillation on chronic Coumadin, 3 vessel CAD with chronic occlusion of RCA, severe stenosis in proximal LCx & moderately severe disease in mid LAD, S/P PTCA, on ASA and Brilinta (Plavix nonresponder), recent admission 07/24/12-08/02/12 for treatment of C. Diff who was sent to the hospital from radiation for evaluation of generalized weakness, vomiting and diarrhea. Patient was receiving stereotactic body radiotherapy (#4 / 5 planned treatments) and was noticed to be very weak and dizzy upon standing up. On admission, he was was noted to be in atrial fibrillation with heart rates in 150s. He also had significant leukocytosis of 32.7. He is currently being treated for recurrent Clostridium difficile infection as well as right lower extremity cellulitis, and over the course of his hospital stay, has shown gradual improvement in his clinical status.  Echo 07/25/12: Left ventricle: The cavity size was normal. There was mild to moderate concentric hypertrophy. Systolic function was normal. The estimated ejection fraction was in the range of 60% to 65%. Wall motion was normal; there were no regional wall motion abnormalities. - Ventricular septum: Mild septal flattening. - Left atrium: The atrium was mildly dilated. - Right atrium: The atrium was mildly dilated. - Atrial septum: No defect or patent foramen ovale was identified.  This am he is awake, alert, eating breakfast. O2 sats are 92% on [place] oxygen.   Filed Vitals:   09/10/12 0333 09/10/12 0400 09/10/12 0600 09/10/12 0800  BP: 98/69 93/62 96/64    Pulse: 120 110 111   Temp:  97.4 F (36.3 C)  97.7 F (36.5 C)  TempSrc:  Oral  Oral  Resp: 19 15 16    Height:      Weight:  189 lb 9.5 oz (86 kg)    SpO2: 95% 96% 91%     Intake/Output Summary (Last 24 hours) at 09/10/12  0807 Last data filed at 09/10/12 0600  Gross per 24 hour  Intake   2020 ml  Output    990 ml  Net   1030 ml    SUBJECTIVE No chest pain or dyspnea.  Heart rate improved today after addition of oral diltiazem yesterday. LABS: Basic Metabolic Panel:  Recent Labs  40/98/11 0340 09/10/12 0105  NA 131* 130*  K 4.3 4.4  CL 99 99  CO2 25 24  GLUCOSE 258* 184*  BUN 54* 57*  CREATININE 1.19 1.12  CALCIUM 7.6* 7.5*  MG 1.8  --   PHOS 2.7  --    Liver Function Tests: No results found for this basename: AST, ALT, ALKPHOS, BILITOT, PROT, ALBUMIN,  in the last 72 hours No results found for this basename: LIPASE, AMYLASE,  in the last 72 hours CBC:  Recent Labs  09/09/12 0340 09/10/12 0105  WBC 6.8 9.2  HGB 7.4* 7.6*  HCT 22.4* 22.5*  MCV 78.3 78.1  PLT 222 230   Hemoglobin A1C: No results found for this basename: HGBA1C,  in the last 72 hours Fasting Lipid Panel: No results found for this basename: CHOL, HDL, LDLCALC, TRIG, CHOLHDL, LDLDIRECT,  in the last 72 hours Thyroid Function Tests: No results found for this basename: TSH, T4TOTAL, FREET3, T3FREE, THYROIDAB,  in the last 72 hours Radiology/Studies:  Dg Chest Port 1 View  09/03/2012  *RADIOLOGY REPORT*  Clinical Data: Pulmonary edema.  History of lung cancer.  PORTABLE CHEST - 1  VIEW  Comparison: PA and lateral chest 07/24/2012.  Findings: Left upper lobe mass is again seen measuring approximately 4.4 cm cranial-caudal, not markedly changed.  The lungs are emphysematous.  Bilateral alveolar and interstitial opacities have worsened.  There is cardiomegaly.  No pneumothorax or pleural fluid.  IMPRESSION:  1.  Bilateral alveolar and interstitial opacities superimposed on chronic change are likely due to pulmonary edema in this patient with cardiomegaly. Multi focal infection or interstitial tumor spread are felt less likely. 2.  Left upper lobe mass.   Original Report Authenticated By: Holley Dexter, M.D.     PHYSICAL  EXAM BP 96/64  Pulse 111  Temp(Src) 97.7 F (36.5 C) (Oral)  Resp 16  Ht 5\' 10"  (1.778 m)  Wt 189 lb 9.5 oz (86 kg)  BMI 27.2 kg/m2  SpO2 91%  General: Elderly, chronically ill appearing, asleep Head: Normal Neck: Negative for carotid bruits. JVD not elevated. Lungs: fine rales. Heart: IRRR S1 S2 without murmurs, tachycardic - 135  Abdomen: Soft, non-tender, non-distended with normoactive bowel sounds. No hepatomegaly. No rebound/guarding. No obvious abdominal masses. Extremities: trace edema.  Distal pedal pulses are 2+ and equal bilaterally. Neuro: nonfocal  TELEMETRY: Telemetry shows atrial fib with controlled ventricular response.  O2 sats - 95% at rest.   ASSESSMENT AND PLAN: 1. Atrial fibrillation with RVR.  He is tachycardic this am.     HR slowing meds have been held.   Will restart metoprolol.  Will continue to hold amiodarone due to possible amiodarone toxicity. Will also give some Digoxin.  Will give a 0.75 mg load and reassess tomorrow.  Renal function appears to be stable.  2. CAD s/p BMS 07/12/12. Continue ASA 81 mg daily. 3. C. Diff colitis. 4. RLE cellulitis. 5. Lung CA. Poor candidate for resection. 6. DM 7. COPD.  - he is still hypoxic at rest.  PCCM to address. 8. Dyspnea - multifactorial - COPD, lung cancer, ? diastolic dysfunction ( he has normal LV systolic function_)  Principal Problem:   Generalized weakness with diarrhea, vomiting, and abdominal pain in the setting of a recent history of Clostridium difficile colitis Active Problems:   Hypertensive heart disease   Type 2 diabetes mellitus with vascular disease   Benign prostatic hypertrophy   Squamous cell lung cancer left upper lobe   COPD (chronic obstructive pulmonary disease)   CAD (coronary artery disease), native coronary artery   Anemia of chronic disease   Leukocytosis   Rapid atrial fibrillation   Cellulitis of right lower extremity   Hypokalemia   Metabolic acidosis   Hypotension,  unspecified   Acute respiratory failure   Acute diastolic CHF (congestive heart failure)   Volume overload   Radiation pneumonitis   Amiodarone pulmonary toxicity   HCAP (healthcare-associated pneumonia)    Signed, Elyn Aquas. MD,FACC 09/10/2012 8:07 AM

## 2012-09-10 NOTE — Progress Notes (Signed)
PULMONARY  / CRITICAL CARE MEDICINE  Name: Brandon Clay MRN: [id number] DOB: [birth year]    ADMISSION DATE:  09/02/2012 CONSULTATION DATE:  09/07/12  REFERRING MD :  [facility] PRIMARY SERVICE: [facility]  CHIEF COMPLAINT:  dyspnea  BRIEF PATIENT DESCRIPTION:  78 yo with COPD, recently diagnosed SCLCA currently undergoing SBRT, AF recently treated with Amiodarone and CAD with ischemic cardiomyopathy who developed progressive pulmonary infiltrates, hypoxemia and respiratory distress requiring transfer to ICU. Recent C-Diff Colitis on oral Vanco. PCCM was asked to evaluate.    SIGNIFICANT EVENTS / STUDIES:  3/27 - Admitted from radiation therapy (#4/5) with weakness/dizzy, & hypoxemia / resp distress 4/01 - Transferred to ICU with hypoxia / respiratory distress  4/02 - Afib with CVR, bp soft 4/03 - soft BP, Afib with increasing rate  LINES / TUBES: PIV  CULTURES: 3/28 C. Dif PCR >>> POS  4/1 Urine >>>large leukocytes, 21-50 WBC, neg nitrate 4/1 UC>>>100k yeast 4/1 MRSA PCR >>> neg   ANTIBIOTICS: Flagyl 3/27 >>>  Vancomycin PO 3/27 >>>  Cefepime 4/1 >>>  Levaquin 4/1 >>>  Vancomycin IV 4/1 >>>4-4 Diflucan 4/3>>>  SUBJECTIVE: NAD at rest   VITAL SIGNS: Temp:  [97.4 F (36.3 C)-98 F (36.7 C)] 97.7 F (36.5 C) (04/04 0800) Pulse Rate:  [108-127] 127 (04/04 0800) Resp:  [14-20] 20 (04/04 0800) BP: (93-113)/(56-76) 113/76 mmHg (04/04 0800) SpO2:  [91 %-96 %] 92 % (04/04 0800) Weight:  [86 kg (189 lb 9.5 oz)] 86 kg (189 lb 9.5 oz) (04/04 0400)  HEMODYNAMICS:    VENTILATOR SETTINGS:     INTAKE / OUTPUT: Intake/Output     04/03 0701 - 04/04 0700 04/04 0701 - 04/05 0700   P.O. 680 240   I.V. (mL/kg) 340 (4) 60 (0.7)   IV Piggyback 1000 100   Total Intake(mL/kg) 2020 (23.5) 400 (4.7)   Urine (mL/kg/hr) 990 (0.5) 350 (1.3)   Total Output 990 350   Net +1030 +50        Stool Occurrence  1 x     PHYSICAL EXAMINATION: General:  Sitting in chair looks better Neuro:   awake HEENT:  Dry mucus membranes Cardiovascular:  RRR Lungs:  resp's even/non-labored, soft crackles Abdomen:  Soft nt Musculoskeletal:  From, no acute deformities Skin:  Clear, no rashes / lesions, no edema  LABS:  Recent Labs Lab 09/04/12 0801  09/05/12 0731  09/07/12 0500 09/07/12 1520 09/07/12 2103 09/08/12 0335 09/09/12 0340 09/10/12 0105  HGB  --   < >  --   < > 7.3*  --   --  7.7* 7.4* 7.6*  WBC  --   < >  --   < > 11.9*  --   --  8.4 6.8 9.2  PLT  --   < >  --   < > 255  --   --  229 222 230  NA  --   < >  --   < > 134*  --   --  132* 131* 130*  K  --   < >  --   < > 4.0  --   --  4.0 4.3 4.4  CL  --   < >  --   < > 102  --   --  101 99 99  CO2  --   < >  --   < > 23  --   --  26 25 24   GLUCOSE  --   < >  --   < >  136*  --   --  110* 258* 184*  BUN  --   < >  --   < > 45*  --   --  43* 54* 57*  CREATININE  --   < >  --   < > 1.13  --   --  1.19 1.19 1.12  CALCIUM  --   < >  --   < > 7.5*  --   --  7.5* 7.6* 7.5*  MG  --   --   --   --   --   --   --   --  1.8  --   PHOS  --   --   --   --   --   --   --   --  2.7  --   APTT  --   --   --   --   --   --   --   --  35  --   INR  --   < >  --   < > 1.81*  --   --  2.24* 2.51* 2.42*  LATICACIDVEN 1.2  --   --   --   --   --   --   --   --   --   PROCALCITON  --   --   --   --   --   --  1.36 1.21 0.65  --   PROBNP  --   --  21308.6*  --  15589.0*  --   --  12670.0*  --   --   PHART  --   --   --   --   --  7.517*  --   --   --   --   PCO2ART  --   --   --   --   --  29.1*  --   --   --   --   PO2ART  --   --   --   --   --  51.0*  --   --   --   --   < > = values in this interval not displayed.  Recent Labs Lab 09/09/12 1144 09/09/12 1743 09/09/12 2115 09/10/12 0558 09/10/12 0748  GLUCAP 366* 190* 204* 128* 124*    Imaging: Dg Chest Port 1 View  09/10/2012  *RADIOLOGY REPORT*  Clinical Data: History of lung carcinoma, follow-up of pulmonary infiltrates  PORTABLE CHEST - 1 VIEW  Comparison: Portable chest  x-ray of 09/09/2012  Findings: Diffuse airspace disease with some interstitial component as well.  Cardiomegaly is stable and there may be small effusions present.  IMPRESSION: No significant change in diffuse airspace disease and suboptimal aeration.   Original Report Authenticated By: Dwyane Dee, M.D.    Dg Chest Port 1 View  09/09/2012  *RADIOLOGY REPORT*  Clinical Data: Airspace disease.  PORTABLE CHEST - 1 VIEW  Comparison: 09/07/2012  Findings: Diffuse parenchymal airspace disease in the lungs suggesting edema, any RDS, or pneumonia.  The heart size appears enlarged although the parenchymal process obscures the heart borders.  Shallow inspiration.  No definite pneumothorax or effusion.  Aeration of the lungs appears similar to previous study, allowing for technical differences.  IMPRESSION: Diffuse bilateral airspace disease again demonstrated in the lungs.   Original Report Authenticated By: Burman Nieves, M.D.     ASSESSMENT / PLAN:   PULMONARY A: Acute hypoxemic resp failure with bilat infiltrates, AECOPD -  Poss XRT pneumonitis, lung CA, HCap?, amiod toxic?  P:   Taper Abx >> d/c vancomycin 4/04, cefepime 4/04 Change to prednisone F/u CXR intermittently Negative fluid balance as tolerated BDs  CARDIOVASCULAR A:  Ischemic CM Mild Hypotension Afib - chronic, on coumadin, RVR 4/3  P:  Per cardiology Coumadin per pharmacy  Brett Canales Minor ACNP Adolph Pollack PCCM Pager [phone] till 3 pm If no answer page [phone] 09/10/2012, 10:03 AM  Reviewed above, examined pt, and agree with assessment/plan.  Coralyn Helling, MD [facility] Pulmonary/Critical Care 09/10/2012, 10:58 AM Pager:  [phone] After 3pm call: [phone]

## 2012-09-10 NOTE — Progress Notes (Signed)
ANTICOAGULATION CONSULT NOTE - Follow Up  Pharmacy Consult for Coumadin Indication: atrial fibrillation  No Known Allergies  Labs:  Recent Labs  09/08/12 0335 09/09/12 0340 09/10/12 0105  HGB 7.7* 7.4* 7.6*  HCT 23.2* 22.4* 22.5*  PLT 229 222 230  APTT  --  35  --   LABPROT 23.8* 25.9* 25.2*  INR 2.24* 2.51* 2.42*  CREATININE 1.19 1.19 1.12    Estimated Creatinine Clearance: 58.8 ml/min (by C-G formula based on Cr of 1.12).   Assessment: 78 yo M on chronic Coumadin 4mg /day for h/o atrial fibrillation. Coumadin held prior to admission due to supratherapeutic INR, resumed 3/30.   INR therapeutic despite no warfarin for past 3 days.  Slight drop in INR from yesterday. Reduced warfarin dosage requirements likely due to drug interactions (particularly fluconazole and metronidazole) and limited dietary intake.  H/H low but stabilizing;  No overt bleeding reported.    Goal of Therapy:  INR 2-3 Monitor platelets by anticoagulation protocol: Yes   Plan:   Warfarin 0.5mg  (1/2 x 1mg ) PO x 1 today.  Daily PT/INR   Elie Goody, PharmD, BCPS Pager: [phone] 09/10/2012  7:41 AM

## 2012-09-10 NOTE — Progress Notes (Signed)
Physical Therapy Treatment Patient Details Name: Brandon Clay MRN: [id number] DOB: 05/26/1935 Today's Date: 09/10/2012 Time: 7829-5621 PT Time Calculation (min): 28 min  PT Assessment / Plan / Recommendation Comments on Treatment Session  Pt. continues to be participatory but medical/cardiopulmonary status limiting activity. HR up to 153, RN aware. Continue as pt. is able to participate.    Follow Up Recommendations  SNF     Does the patient have the potential to tolerate intense rehabilitation     Barriers to Discharge        Equipment Recommendations  Rolling walker with 5" wheels;Wheelchair (measurements PT);Wheelchair cushion (measurements PT)    Recommendations for Other Services    Frequency Min 3X/week   Plan Discharge plan remains appropriate;Frequency remains appropriate    Precautions / Restrictions Precautions Precautions: Fall Precaution Comments: O2 dependent, HR up into 150's, RN aware.   Pertinent Vitals/Pain HR rang 125-153, RN aware, sats >90% on 6 l.    Mobility  Bed Mobility Bed Mobility: Sitting - Scoot to Edge of Bed Supine to Sit: 3: Mod assist;HOB elevated;With rails Sitting - Scoot to Edge of Bed: 4: Min assist Details for Bed Mobility Assistance: assistance for getting trunk upright. Transfers Transfers: Stand Pivot Transfers Sit to Stand: 1: +2 Total assist;From bed;With upper extremity assist;From elevated surface Sit to Stand: Patient Percentage: 60% Stand to Sit: 3: Mod assist;With upper extremity assist;To chair/3-in-1 Stand Pivot Transfers: 1: +2 Total assist Stand Pivot Transfers: Patient Percentage: 60% Details for Transfer Assistance: VCS safety, technique, hand placement. Assist to rise, stabilize, control descent. Ambulation/Gait Ambulation/Gait Assistance: Not tested (comment) (HR maintaining > 125.) Gait Pattern: Step-to pattern    Exercises     PT Diagnosis:    PT Problem List:   PT Treatment Interventions:     PT  Goals Acute Rehab PT Goals Pt will go Supine/Side to Sit: with supervision PT Goal: Supine/Side to Sit - Progress: Progressing toward goal Pt will go Sit to Stand: with min assist PT Goal: Sit to Stand - Progress: Progressing toward goal Pt will Transfer Bed to Chair/Chair to Bed: with min assist PT Transfer Goal: Bed to Chair/Chair to Bed - Progress: Progressing toward goal  Visit Information  Last PT Received On: 09/10/12 Assistance Needed: +2    Subjective Data  Subjective: It feels good to get up in the chair.   Cognition  Cognition Overall Cognitive Status: Appears within functional limits for tasks assessed/performed    Balance  Balance Balance Assessed: Yes Static Sitting Balance Static Sitting - Balance Support: Bilateral upper extremity supported Static Sitting - Level of Assistance: 5: Stand by assistance  End of Session PT - End of Session Equipment Utilized During Treatment: Oxygen Activity Tolerance: Patient limited by fatigue Patient left: with call bell/phone within reach;with family/visitor present;in chair Nurse Communication: Mobility status   GP     Rada Hay 09/10/2012, 9:54 AM  Blanchard Kelch PT [phone]

## 2012-09-10 NOTE — Progress Notes (Signed)
[facility] PROGRESS NOTE  Brandon Clay ZOX:[id number] DOB: 08-27-1934 DOA: 09/02/2012 PCP: [name], MD  Brief narrative: 78 year old male with a PMH of 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 difficile colitis requiring PO vancomycin, atrial fibrillation on chronic Coumadin, 3-vessel CAD with chronic occlusion of RCA, severe stenosis in proximal LCx & moderately severe disease in mid LAD, S/P PTCA, on ASA and Brilinta (Plavix nonresponder), recent admission 07/24/12-08/02/12 for treatment of C. Diff who was sent to the hospital from radiation for evaluation of generalized weakness, vomiting and diarrhea. Patient was receiving stereotactic body radiotherapy (#4 / 5 planned treatments) and was noticed to be very weak and dizzy upon standing up. On admission, he was was noted to be in atrial fibrillation with heart rates in 150s. He also had significant leukocytosis of 32.7. He is currently being treated for recurrent Clostridium difficile infection as well as right lower extremity cellulitis. After initially showing some improvement in his clinical status, his condition deteriorated on 09/07/12 requiring transfer back to the ICU for acute respiratory failure. PCCM is assisting with management. The patient's clinical status has improved with empiric antibiotics and steroids.   Assessment/Plan:   Principal Problem:  *Generalized weakness in the setting of nausea, vomiting, diarrhea, dehydration / Recurrent Clostridium difficile colitis  -Likely from recurrent C. difficile colitis. C. difficile PCR positive. Continue IV flagyl and Vanco PO - nausea, vomiting and diarrhea resolved. - WBC count is WNL. - per PT evaluation, recommendation for SNF versus 24 hour home supervision   Active Problems:  Acute hypoxic respiratory failure  - secondary to acute diastolic CHF and volume overload / HCAP and radiation pneumonitis  - BNP trended down from 25,958 to 12,670  (09/08/2012) with lasix.  - CXR repeated 09/10/2012 with similar findings of persistent opacities. We will continue current antibiotic regimen with cefepime, Levaquin and vanco - Empiric steroids started by PCCM for radiation pneumonitis. Prednisone 60 mg daily. Supratherapeutic INR  - Vitamin K 0.5 mg given once 09/04/2012.  - INR now therapeutic - coumadin per pharmacy Metabolic acidosis  - secondary to bicarbonate losses from GI losses - resolved, bicarb now 24 Right lower extremity cellulitis  - improving; continue IV vancomycin  Atrial fibrillation with RVR  - Initially treated with Cardizem drip and metoprolol. Cardizem drip discontinued secondary to low blood pressure and given digoxin on 09/02/2012. Was on amiodarone drip which is now discontinued due to toxicity.  - Currently on digoxin 0.25 mg Q 6 hours IV and 0.125 mg PO daily - continue metoprolol 25 mg PO BID - coumadin per pharmacy Leukocytosis  - secondary to HCAP, C.diff colitis. - WBC count WNL  H/O Hypertension / Hypotension  - BP still on soft side, 97/65 Hypercholesterolemia  - statin intolerant  Diabetes, type II and with complications / hypoglycemia  - Hemoglobin A1c 6.7%.  - CBG's in past 24 ours: 204, 128, 124 - Metformin currently on hold. Continue Levemir 30 units at bedtime and Novolog for meal coverage 8 units TIDAC - continue sliding scale insulin  Squamous cell lung cancer left upper lobe  - Completed stereotactic body radiation 09/07/12.  - Not a candidate for lung resection.  COPD (chronic obstructive pulmonary disease)  - Continue Symbicort. Continue prednisone 60 mg daily CAD (coronary artery disease), native coronary artery   - Off of Brilinta per cardiology recommendations.  Anemia of chronic disease  - Secondary to history multiple medical problems including squamous cell lung cancer.  - No  signs of active bleed. Hemoglobin stable with no indication for transfusion.  Benign prostatic hypertrophy  -  Continue Finasteride and Flomax.   Code Status: Full  Family Communication: Family not at bedside..  Disposition Plan: Pending discharge plan secondary to acute illness   Medical Consultants:  Dr. Olga Millers, Cardiology.  Dr. Lonia Farber, PCCM Other Consultants:  Physical therapy: Skilled nursing home placement versus home with 24-hour supervision. Anti-infectives:  Vancomycin 09/02/2012--->  Flagyl 09/02/2012---->  IV Vancomycin 09/03/12--->  Cefepime 09/07/12--->  Levaquin 09/07/12--->   [name], MD  [facility] Pager [phone]  If 7PM-7AM, please contact night-coverage [email or web address] Password [email or web address] 09/10/2012, 10:43 AM   LOS: 8 days    HPI/Subjective: Patient feels better. He wants to go home today.  Objective: Filed Vitals:   09/10/12 0333 09/10/12 0400 09/10/12 0600 09/10/12 0800  BP: 98/69 93/62 96/64  113/76  Pulse: 120 110 111 127  Temp:  97.4 F (36.3 C)  97.7 F (36.5 C)  TempSrc:  Oral  Oral  Resp: 19 15 16 20   Height:      Weight:  86 kg (189 lb 9.5 oz)    SpO2: 95% 96% 91% 92%    Intake/Output Summary (Last 24 hours) at 09/10/12 1043 Last data filed at 09/10/12 0902  Gross per 24 hour  Intake   1920 ml  Output   1340 ml  Net    580 ml    Exam:   General:  Pt is alert, follows commands appropriately, not in acute distress  Cardiovascular: Irregular rhythm, tachycardic, appreciated S1/S2  Respiratory: Diminished breath sounds bilaterally, no wheezing  Abdomen: Soft, non tender, non distended, bowel sounds present, no guarding  Extremities: Lower extremity pitting edema, pulses DP and PT palpable bilaterally; right lower extremity cellulitis improving  Neuro: Grossly nonfocal  Data Reviewed: Basic Metabolic Panel:  Recent Labs Lab 09/06/12 0756 09/07/12 0500 09/08/12 0335 09/09/12 0340 09/10/12 0105  NA 131* 134* 132* 131* 130*  K 4.3 4.0 4.0 4.3 4.4  CL 100 102 101 99 99  CO2 20 23 26 25 24   GLUCOSE 187* 136* 110* 258*  184*  BUN 40* 45* 43* 54* 57*  CREATININE 1.14 1.13 1.19 1.19 1.12  CALCIUM 7.5* 7.5* 7.5* 7.6* 7.5*  MG  --   --   --  1.8  --   PHOS  --   --   --  2.7  --    Liver Function Tests: No results found for this basename: AST, ALT, ALKPHOS, BILITOT, PROT, ALBUMIN,  in the last 168 hours No results found for this basename: LIPASE, AMYLASE,  in the last 168 hours No results found for this basename: AMMONIA,  in the last 168 hours CBC:  Recent Labs Lab 09/06/12 0756 09/07/12 0500 09/08/12 0335 09/09/12 0340 09/10/12 0105  WBC 17.5* 11.9* 8.4 6.8 9.2  HGB 8.6* 7.3* 7.7* 7.4* 7.6*  HCT 25.9* 21.7* 23.2* 22.4* 22.5*  MCV 78.7 78.1 78.6 78.3 78.1  PLT 282 255 229 222 230   Cardiac Enzymes: No results found for this basename: CKTOTAL, CKMB, CKMBINDEX, TROPONINI,  in the last 168 hours BNP: No components found with this basename: POCBNP,  CBG:  Recent Labs Lab 09/09/12 1144 09/09/12 1743 09/09/12 2115 09/10/12 0558 09/10/12 0748  GLUCAP 366* 190* 204* 128* 124*    Recent Results (from the past 240 hour(s))  MRSA PCR SCREENING     Status: None   Collection Time    09/02/12 10:21 PM  Result Value Range Status   MRSA by PCR NEGATIVE  NEGATIVE Final   Comment:            The GeneXpert MRSA Assay (FDA     approved for NASAL specimens     only), is one component of a     comprehensive MRSA colonization     surveillance program. It is not     intended to diagnose MRSA     infection nor to guide or     monitor treatment for     MRSA infections.  CLOSTRIDIUM DIFFICILE BY PCR     Status: Abnormal   Collection Time    09/03/12 11:27 PM      Result Value Range Status   C difficile by pcr POSITIVE (*) NEGATIVE Final   Comment: CRITICAL RESULT CALLED TO, READ BACK BY AND VERIFIED WITH:     RICHARDSON,N RN 09/03/12 1410 WOOTEN,K  URINE CULTURE     Status: None   Collection Time    09/07/12  4:26 PM      Result Value Range Status   Specimen Description URINE, CLEAN CATCH    Final   Special Requests NONE   Final   Culture  Setup Time 09/07/2012 21:17   Final   Colony Count >=100,000 COLONIES/ML   Final   Culture YEAST   Final   Report Status 09/08/2012 FINAL   Final  MRSA PCR SCREENING     Status: None   Collection Time    09/07/12  6:46 PM      Result Value Range Status   MRSA by PCR NEGATIVE  NEGATIVE Final   Comment:            The GeneXpert MRSA Assay (FDA     approved for NASAL specimens     only), is one component of a     comprehensive MRSA colonization     surveillance program. It is not     intended to diagnose MRSA     infection nor to guide or     monitor treatment for     MRSA infections.     Studies: Dg Chest Port 1 View 09/10/2012  IMPRESSION: No significant change in diffuse airspace disease and suboptimal aeration.   Original Report Authenticated By: Dwyane Dee, M.D.    Dg Chest Port 1 View 09/09/2012  * IMPRESSION: Diffuse bilateral airspace disease again demonstrated in the lungs.   Original Report Authenticated By: Burman Nieves, M.D.     Scheduled Meds: . aspirin EC  81 mg Oral QODAY  . budesonide-formoterol  2 puff Inhalation BID  . ceFEPime (MAXIPIME)  1 g Intravenous Q12H  . digoxin  0.25 mg Intravenous Q6H  . feeding supplement  237 mL Oral BID BM  . fluconazole (DIFLUCAN)   100 mg Intravenous Q24H  . furosemide  40 mg Intravenous Once  . insulin aspart  0-20 Units Subcutaneous TID WC  . insulin aspart  0-5 Units Subcutaneous QHS  . insulin aspart  8 Units Subcutaneous TID WC  . insulin detemir  30 Units Subcutaneous QHS  . levofloxacin (LEVAQUIN)  500 mg Intravenous Q24H  . methylPREDNISolone  80 mg Intravenous Q8H  . metoprolol tartrate  25 mg Oral BID  . metronidazole  500 mg Intravenous Q8H  . multivitamin   1 tablet Oral Daily  . vancomycin  125 mg Oral Q6H  . warfarin  0.5 mg Oral ONCE-1800

## 2012-09-10 NOTE — Progress Notes (Signed)
[id number]/Lunna Vogelgesang, RN, BSN, CCM:  CHART REVIEWED AND UPDATED.  Next chart review due on [id number]. NO DISCHARGE NEEDS PRESENT AT THIS TIME. CASE MANAGEMENT [phone] 

## 2012-09-10 NOTE — Clinical Social Work Psychosocial (Signed)
Clinical Social Work Department BRIEF PSYCHOSOCIAL ASSESSMENT 09/10/2012  Patient:  Brandon Clay, Brandon Clay     Account Number:  [id number]     Admit date:  09/02/2012  Clinical Social Worker:  Jodelle Red  Date/Time:  09/10/2012 10:30 AM  Referred by:  CSW  Date Referred:  09/09/2012 Referred for  Other - See comment   Other Referral:   possible need for SNF after reviewing PT note for SNF.   Interview type:  Patient Other interview type:    PSYCHOSOCIAL DATA Living Status:  FAMILY Admitted from facility:   Level of care:   Primary support name:  Kathlene Cote Primary support relationship to patient:  SPOUSE Degree of support available:   good from daughter and wife    CURRENT CONCERNS Current Concerns  Other - See comment   Other Concerns:   possible need for SNF    SOCIAL WORK ASSESSMENT / PLAN CSW reviewed chart and spoke with Pt about PT recs for SNF. CSW explained process and provided Pt with list of SNFs in his area. Pt resistant to SNF currently, states he has to go home. CSW tried to explain further, pt still not interested in SNF at this time.  CSW will follow up and suggested to Pt to share idea with his wife. He agreed.   Assessment/plan status:  Psychosocial Support/Ongoing Assessment of Needs Other assessment/ plan:   reassess openess to SNF   Information/referral to community resources:    PATIENT'S/FAMILY'S RESPONSE TO PLAN OF CARE: CSW will revisit rec for SNF. Pt refuses SNF search currently, but will disuss with wife. CSW will reassess/discuss in the days to come. SNF list left for pt to review.   Doreen Salvage, LCSW ICU/Stepdown Clinical Social Worker [facility] Cell [phone] Hours 8am-1200pm M-F

## 2012-09-11 DIAGNOSIS — E43 Unspecified severe protein-calorie malnutrition: Secondary | ICD-10-CM | POA: Diagnosis present

## 2012-09-11 LAB — CBC
HCT: 22.7 % — ABNORMAL LOW (ref 39.0–52.0)
MCH: 26.7 pg (ref 26.0–34.0)
MCHC: 33.5 g/dL (ref 30.0–36.0)
MCV: 79.6 fL (ref 78.0–100.0)
Platelets: 195 10*3/uL (ref 150–400)
RDW: 18.4 % — ABNORMAL HIGH (ref 11.5–15.5)
WBC: 10.2 10*3/uL (ref 4.0–10.5)

## 2012-09-11 LAB — COMPREHENSIVE METABOLIC PANEL
ALT: 28 U/L (ref 0–53)
AST: 43 U/L — ABNORMAL HIGH (ref 0–37)
Albumin: 1.7 g/dL — ABNORMAL LOW (ref 3.5–5.2)
Alkaline Phosphatase: 72 U/L (ref 39–117)
BUN: 53 mg/dL — ABNORMAL HIGH (ref 6–23)
Chloride: 106 mEq/L (ref 96–112)
Potassium: 4.6 mEq/L (ref 3.5–5.1)
Sodium: 136 mEq/L (ref 135–145)
Total Bilirubin: 0.2 mg/dL — ABNORMAL LOW (ref 0.3–1.2)

## 2012-09-11 LAB — TYPE AND SCREEN
ABO/RH(D): A POS
Antibody Screen: NEGATIVE

## 2012-09-11 LAB — GLUCOSE, CAPILLARY
Glucose-Capillary: 48 mg/dL — ABNORMAL LOW (ref 70–99)
Glucose-Capillary: 59 mg/dL — ABNORMAL LOW (ref 70–99)

## 2012-09-11 LAB — PRO B NATRIURETIC PEPTIDE: Pro B Natriuretic peptide (BNP): 12704 pg/mL — ABNORMAL HIGH (ref 0–450)

## 2012-09-11 LAB — PROTIME-INR: Prothrombin Time: 25.2 seconds — ABNORMAL HIGH (ref 11.6–15.2)

## 2012-09-11 LAB — PROCALCITONIN: Procalcitonin: 0.24 ng/mL

## 2012-09-11 MED ORDER — WARFARIN 0.5 MG HALF TABLET
0.5000 mg | ORAL_TABLET | Freq: Once | ORAL | Status: AC
  Administered 2012-09-11: 0.5 mg via ORAL
  Filled 2012-09-11: qty 1

## 2012-09-11 MED ORDER — FUROSEMIDE 10 MG/ML IJ SOLN
40.0000 mg | Freq: Once | INTRAMUSCULAR | Status: AC
  Administered 2012-09-11: 40 mg via INTRAVENOUS
  Filled 2012-09-11: qty 4

## 2012-09-11 NOTE — Progress Notes (Signed)
ANTICOAGULATION CONSULT NOTE - Follow Up  Pharmacy Consult for Coumadin Indication: atrial fibrillation  No Known Allergies  Labs:  Recent Labs  09/09/12 0340 09/10/12 0105 09/11/12 0400  HGB 7.4* 7.6* 7.6*  HCT 22.4* 22.5* 22.7*  PLT 222 230 195  APTT 35  --   --   LABPROT 25.9* 25.2* 25.2*  INR 2.51* 2.42* 2.42*  CREATININE 1.19 1.12 1.13    Estimated Creatinine Clearance: 58.3 ml/min (by C-G formula based on Cr of 1.13).   Assessment: 78 yo M on chronic Coumadin 4mg /day for h/o atrial fibrillation. Coumadin held prior to admission due to supratherapeutic INR, resumed 3/30.   INR remains therapeutic and stable from yesterday. Reduced warfarin dosage requirements likely due to drug interactions (particularly fluconazole and metronidazole but also levofloxacin and prednisone) and limited dietary intake.  H/H low but stabilizing, Pltc trending down, no overt bleeding reported.   Goal of Therapy:  INR 2-3 Monitor platelets by anticoagulation protocol: Yes   Plan:   Repeat warfarin 0.5mg  (1/2 x 1mg ) PO x 1 today.  Daily PT/INR.  Charolotte Eke, PharmD, pager [phone]. 09/11/2012,11:05 AM.

## 2012-09-11 NOTE — Progress Notes (Signed)
Brandon Clay is an 77 y.o. 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 difficile colitis requiring PO vancomycin, atrial fibrillation on chronic Coumadin, 3 vessel CAD with chronic occlusion of RCA, severe stenosis in proximal LCx & moderately severe disease in mid LAD, S/P PTCA, on ASA and Brilinta (Plavix nonresponder), recent admission 07/24/12-08/02/12 for treatment of C. Diff who was sent to the hospital from radiation for evaluation of generalized weakness, vomiting and diarrhea. Patient was receiving stereotactic body radiotherapy (#4 / 5 planned treatments) and was noticed to be very weak and dizzy upon standing up. On admission, he was was noted to be in atrial fibrillation with heart rates in 150s. He also had significant leukocytosis of 32.7. He is currently being treated for recurrent Clostridium difficile infection as well as right lower extremity cellulitis, and over the course of his hospital stay, has shown gradual improvement in his clinical status.  Echo 07/25/12: Left ventricle: The cavity size was normal. There was mild to moderate concentric hypertrophy. Systolic function was normal. The estimated ejection fraction was in the range of 60% to 65%. Wall motion was normal; there were no regional wall motion abnormalities. - Ventricular septum: Mild septal flattening. - Left atrium: The atrium was mildly dilated. - Right atrium: The atrium was mildly dilated. - Atrial septum: No defect or patent foramen ovale was identified.  This am he is awake, alert, eating breakfast. O2 sats are 92% on [place] oxygen.  His HR is still elevated.  We started digoxin yesterday   Filed Vitals:   09/11/12 0000 09/11/12 0200 09/11/12 0400 09/11/12 0600  BP: 105/65 106/69 96/53 102/65  Pulse: 82 80 88 78  Temp: 97.4 F (36.3 C)  97.5 F (36.4 C)   TempSrc: Oral  Oral   Resp: 14 16 19 17   Height:      Weight:   191 lb 12.8 oz (87 kg)   SpO2: 97% 97%  92% 94%    Intake/Output Summary (Last 24 hours) at 09/11/12 0800 Last data filed at 09/11/12 0600  Gross per 24 hour  Intake    950 ml  Output    775 ml  Net    175 ml    SUBJECTIVE No chest pain or dyspnea.  Heart rate improved today after addition of oral diltiazem yesterday. LABS: Basic Metabolic Panel:  Recent Labs  16/10/96 0340 09/10/12 0105 09/11/12 0400  NA 131* 130* 136  K 4.3 4.4 4.6  CL 99 99 106  CO2 25 24 23   GLUCOSE 258* 184* 63*  BUN 54* 57* 53*  CREATININE 1.19 1.12 1.13  CALCIUM 7.6* 7.5* 7.7*  MG 1.8  --   --   PHOS 2.7  --   --    Liver Function Tests:  Recent Labs  09/11/12 0400  AST 43*  ALT 28  ALKPHOS 72  BILITOT 0.2*  PROT 4.2*  ALBUMIN 1.7*   No results found for this basename: LIPASE, AMYLASE,  in the last 72 hours CBC:  Recent Labs  09/10/12 0105 09/11/12 0400  WBC 9.2 10.2  HGB 7.6* 7.6*  HCT 22.5* 22.7*  MCV 78.1 79.6  PLT 230 195   Hemoglobin A1C: No results found for this basename: HGBA1C,  in the last 72 hours Fasting Lipid Panel: No results found for this basename: CHOL, HDL, LDLCALC, TRIG, CHOLHDL, LDLDIRECT,  in the last 72 hours Thyroid Function Tests: No results found for this basename: TSH,  T4TOTAL, FREET3, T3FREE, THYROIDAB,  in the last 72 hours Radiology/Studies:  Dg Chest Port 1 View  09/03/2012  *RADIOLOGY REPORT*  Clinical Data: Pulmonary edema.  History of lung cancer.  PORTABLE CHEST - 1 VIEW  Comparison: PA and lateral chest 07/24/2012.  Findings: Left upper lobe mass is again seen measuring approximately 4.4 cm cranial-caudal, not markedly changed.  The lungs are emphysematous.  Bilateral alveolar and interstitial opacities have worsened.  There is cardiomegaly.  No pneumothorax or pleural fluid.  IMPRESSION:  1.  Bilateral alveolar and interstitial opacities superimposed on chronic change are likely due to pulmonary edema in this patient with cardiomegaly. Multi focal infection or interstitial tumor  spread are felt less likely. 2.  Left upper lobe mass.   Original Report Authenticated By: Holley Dexter, M.D.     PHYSICAL EXAM BP 102/65  Pulse 78  Temp(Src) 97.5 F (36.4 C) (Oral)  Resp 17  Ht 5\' 10"  (1.778 m)  Wt 191 lb 12.8 oz (87 kg)  BMI 27.52 kg/m2  SpO2 94%  General: Elderly, awake and alert today Head: Normal Neck: Negative for carotid bruits. JVD not elevated. Lungs: fine rales. Heart: IRRR S1 S2 without murmurs, tachycardic - 125 Abdomen: Soft, non-tender, non-distended with normoactive bowel sounds. No hepatomegaly. No rebound/guarding. No obvious abdominal masses. Extremities: trace edema.  Distal pedal pulses are 2+ and equal bilaterally. Neuro: nonfocal  TELEMETRY: Telemetry shows atrial fib with controlled ventricular response.  O2 sats - 94% at rest.   ASSESSMENT AND PLAN: 1. Atrial fibrillation with RVR.  He is tachycardic this am.     HR slowing meds have been held.   currently on low dose metoprolol and digoxin.  Will continue digoxin for now.  His rapid ventricular response may be due to other (non-cardiac) issues - anemia, lung disease, lung cancer, c-diff, etc.  2. CAD s/p BMS 07/12/12. Continue ASA 81 mg daily. 3. C. Diff colitis. 4. RLE cellulitis. 5. Lung CA. Poor candidate for resection. 6. DM 7. COPD.  - he is still hypoxic at rest.  PCCM to address. 8. Dyspnea - multifactorial - COPD, lung cancer, ? diastolic dysfunction ( he has normal LV systolic function_)  Principal Problem:   Generalized weakness with diarrhea, vomiting, and abdominal pain in the setting of a recent history of Clostridium difficile colitis Active Problems:   Hypertensive heart disease   Type 2 diabetes mellitus with vascular disease   Benign prostatic hypertrophy   Squamous cell lung cancer left upper lobe   COPD (chronic obstructive pulmonary disease)   CAD (coronary artery disease), native coronary artery   Anemia of chronic disease   Leukocytosis   C.  difficile colitis   Rapid atrial fibrillation   Cellulitis of right lower extremity   Hypokalemia   Metabolic acidosis   Hypotension, unspecified   Acute respiratory failure   Acute diastolic CHF (congestive heart failure)   Volume overload   Radiation pneumonitis   Amiodarone pulmonary toxicity   HCAP (healthcare-associated pneumonia)    Signed, Elyn Aquas. MD,FACC 09/11/2012 8:00 AM

## 2012-09-11 NOTE — Progress Notes (Signed)
Brandon Clay 1230 HIS AM CBG WAS 37- RECK'D 48. AFTER 4OZ. OJ AND MILK HIS CBG WAS 70. HE DID EAT A  SMALL AMOUNT OF HIS BF. MD WAS INFORMED AND ORDERS RECEIVED. HIS NOON CBG WAS 77. HE DID EAT PART OF HIS LUNCH.

## 2012-09-11 NOTE — Progress Notes (Signed)
PULMONARY  / CRITICAL CARE MEDICINE  Name: Brandon Clay MRN: [id number] DOB: [birth year]    ADMISSION DATE:  09/02/2012 CONSULTATION DATE:  09/07/12  REFERRING MD :  [facility] PRIMARY SERVICE: [facility]  CHIEF COMPLAINT:  dyspnea  BRIEF PATIENT DESCRIPTION:  78 yo with COPD, recently diagnosed SCLCA currently undergoing SBRT, AF recently treated with Amiodarone and CAD with ischemic cardiomyopathy who developed progressive pulmonary infiltrates, hypoxemia and respiratory distress requiring transfer to ICU. Recent C-Diff Colitis on oral Vanco. PCCM was asked to evaluate.    SIGNIFICANT EVENTS / STUDIES:  3/27 - Admitted from radiation therapy (#4/5) with weakness/dizzy, & hypoxemia / resp distress 4/01 - Transferred to ICU with hypoxia / respiratory distress  4/02 - Afib with CVR, bp soft 4/03 - soft BP, Afib with increasing rate  LINES / TUBES: PIV  CULTURES: 3/28 C. Dif PCR >>> POS  4/1 Urine >>>large leukocytes, 21-50 WBC, neg nitrate 4/1 UC>>>100k yeast 4/1 MRSA PCR >>> neg   ANTIBIOTICS: Flagyl 3/27 >>>  Vancomycin PO 3/27 >>>  Cefepime 4/1 >>>  Levaquin 4/1 >>>  Vancomycin IV 4/1 >>>4-4 Diflucan 4/3>>>  SUBJECTIVE: 09/11/12 - I want to go home. He is deconditoined.No overnight issues  VITAL SIGNS: Temp:  [95.8 F (35.4 C)-98.6 F (37 C)] 95.8 F (35.4 C) (04/05 0800) Pulse Rate:  [78-119] 78 (04/05 0600) Resp:  [14-20] 17 (04/05 0600) BP: (96-117)/(53-80) 102/65 mmHg (04/05 0600) SpO2:  [92 %-100 %] 94 % (04/05 0600) Weight:  [87 kg (191 lb 12.8 oz)] 87 kg (191 lb 12.8 oz) (04/05 0400)      INTAKE / OUTPUT: Intake/Output     04/04 0701 - 04/05 0700 04/05 0701 - 04/06 0700   P.O. 600    I.V. (mL/kg) 240 (2.8)    IV Piggyback 350    Total Intake(mL/kg) 1190 (13.7)    Urine (mL/kg/hr) 1125 (0.5)    Total Output 1125     Net +65          Stool Occurrence 1 x      PHYSICAL EXAMINATION: General:  Sitting in bed looks better but still deconditioned Neuro:   awake HEENT:  Dry mucus membranes Cardiovascular:  RRR Lungs:  resp's even/non-labored, soft crackles Abdomen:  Soft nt Musculoskeletal:  From, no acute deformities Skin:  Clear, no rashes / lesions, no edema  LABS:  Recent Labs Lab 09/07/12 0500 09/07/12 1520  09/08/12 0335 09/09/12 0340 09/10/12 0105 09/11/12 0400  HGB 7.3*  --   --  7.7* 7.4* 7.6* 7.6*  WBC 11.9*  --   --  8.4 6.8 9.2 10.2  PLT 255  --   --  229 222 230 195  NA 134*  --   --  132* 131* 130* 136  K 4.0  --   --  4.0 4.3 4.4 4.6  CL 102  --   --  101 99 99 106  CO2 23  --   --  26 25 24 23   GLUCOSE 136*  --   --  110* 258* 184* 63*  BUN 45*  --   --  43* 54* 57* 53*  CREATININE 1.13  --   --  1.19 1.19 1.12 1.13  CALCIUM 7.5*  --   --  7.5* 7.6* 7.5* 7.7*  MG  --   --   --   --  1.8  --   --   PHOS  --   --   --   --  2.7  --   --  AST  --   --   --   --   --   --  43*  ALT  --   --   --   --   --   --  28  ALKPHOS  --   --   --   --   --   --  72  BILITOT  --   --   --   --   --   --  0.2*  PROT  --   --   --   --   --   --  4.2*  ALBUMIN  --   --   --   --   --   --  1.7*  APTT  --   --   --   --  35  --   --   INR 1.81*  --   --  2.24* 2.51* 2.42* 2.42*  PROCALCITON  --   --   < > 1.21 0.65  --  0.24  PROBNP 15589.0*  --   --  12670.0*  --   --  12704.0*  PHART  --  7.517*  --   --   --   --   --   PCO2ART  --  29.1*  --   --   --   --   --   PO2ART  --  51.0*  --   --   --   --   --   < > = values in this interval not displayed.  Recent Labs Lab 09/10/12 1603 09/10/12 2200 09/11/12 0801 09/11/12 0803 09/11/12 0833  GLUCAP 195* 148* 37* 48* 59*    Imaging: Dg Chest Port 1 View  09/10/2012  *RADIOLOGY REPORT*  Clinical Data: History of lung carcinoma, follow-up of pulmonary infiltrates  PORTABLE CHEST - 1 VIEW  Comparison: Portable chest x-ray of 09/09/2012  Findings: Diffuse airspace disease with some interstitial component as well.  Cardiomegaly is stable and there may be small effusions  present.  IMPRESSION: No significant change in diffuse airspace disease and suboptimal aeration.   Original Report Authenticated By: Dwyane Dee, M.D.     ASSESSMENT / PLAN:   PULMONARY A: Acute hypoxemic resp failure with bilat infiltrates, AECOPD - Poss XRT pneumonitis, lung CA, HCap?, amiod toxic?  P:   Taper Abx >> d/c vancomycin 4/04, cefepime 4/04 Change to prednisone F/u CXR intermittently Negative fluid balance as tolerated BDs  CARDIOVASCULAR A:  Ischemic CM Mild Hypotension Afib - chronic, on coumadin, RVR 4/3  P:  Per cardiology Coumadin per pharmacy  PCCM will check back 09/13/12   Dr. Kalman Shan, M.D., [facility].C.P Pulmonary and Critical Care Medicine Staff Physician [facility] Pager: [phone], If no answer or between  15:00h - 7:00h: call [phone]  09/11/2012 10:17 AM

## 2012-09-11 NOTE — Progress Notes (Addendum)
[facility] PROGRESS NOTE  TATSUYA OKRAY RUE:[id number] DOB: [birth year] DOA: 09/02/2012 PCP: [name], MD  Brief narrative: 78 year old male with a PMH of 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 difficile colitis requiring PO vancomycin, atrial fibrillation on chronic Coumadin, 3-vessel CAD with chronic occlusion of RCA, severe stenosis in proximal LCx & moderately severe disease in mid LAD, S/P PTCA, on ASA and Brilinta (Plavix nonresponder), recent admission 07/24/12-08/02/12 for treatment of C. Diff who was sent to the hospital from radiation for evaluation of generalized weakness, vomiting and diarrhea. Patient was receiving stereotactic body radiotherapy, his 4th treatment and was noticed to be very weak and dizzy upon standing up. On admission, he was was noted to be in atrial fibrillation with heart rates in 150s. He also had significant leukocytosis of 32.7. He is currently being treated for recurrent Clostridium difficile infection as well as right lower extremity cellulitis. After initially showing some improvement in his clinical status, his condition deteriorated on 09/07/12 requiring transfer back to the ICU for acute respiratory failure. Patient continues to have tachycardia-bradycardia (45 -127) and cardiology is assisting the management.  Assessment/Plan:   Principal Problem:  *Severe sepsis in the setting of Recurrent C. Difficile colitis and yeast UTI  C. difficile PCR positive. Continue IV flagyl and Vanco PO   Nausea, vomiting and diarrhea is resolved  WBC count remains WNL but patient does have slight hypothermia with T = 95.53F. Check procalcitonin level.  Urine culture from 4/2 growing yeast but final report is not yet available. Continue fluconazole IV.  Active Problems:  Tachycardia/bradycardia  Now on low dose metoprolol and on digoxin Acute hypoxic respiratory failure   Secondary to acute diastolic CHF and volume overload /  HCAP and radiation pneumonitis   BNP trended down from 25,958 to 12,670 (09/08/2012) with lasix. Follow up BNP level today. Due to low urine output we will give another dose of lasix today 40 mg IV. Contineu daily weight monitoring and strict intake and output.  Respiratory status is stable. Oxygen saturation ranges 83 - 100% in past 24 hours.  CXR repeated 09/10/2012 with similar findings of persistent opacities. Patient was initially on Levaquin, Cefepime and Vanco IV but now all discontinued except for Levaquin  Empiric steroids started by [facility] for radiation pneumonitis. Prednisone 60 mg daily.  Supratherapeutic INR   Vitamin K 0.5 mg given once 09/04/2012.   INR continues to be in the therapeutic range  Coumadin per pharmacy  Metabolic acidosis   Secondary to bicarbonate losses from GI losses   Resolved, bicarb now 23 Right lower extremity cellulitis   Improved. Was on vancomycin but this is now discontinued.  Atrial fibrillation with RVR   Initially treated with Cardizem drip and metoprolol. Cardizem drip discontinued secondary to low blood pressure and given digoxin on 09/02/2012. Was on amiodarone drip which is now discontinued due to toxicity.   Currently on digoxin 0.125 mg PO daily   Continue metoprolol 25 mg PO BID   Coumadin per pharmacy  Leukocytosis   Secondary to HCAP, C.diff colitis, steroids  WBC count WNL  H/O Hypertension / Hypotension   BP 102/65; on low dose metoprolol Hypercholesterolemia   Statin intolerant  Diabetes, type II and with complications / hypoglycemia   Hemoglobin A1c 6.7%.   CBG's in past 24 hours: 37, 48, 59  Metformin currently on hold. Would discontinue current insulin regimen and leave only sliding scale due to frequent hypoglycemias Squamous cell lung cancer left upper  lobe   Completed stereotactic body radiation 09/07/12.   Not a candidate for lung resection due to multiple medical comorbidities COPD (chronic obstructive  pulmonary disease)   Continue Symbicort. Continue prednisone 60 mg daily  CAD (coronary artery disease), native coronary artery   Off of Brilinta per cardiology recommendations.   Continue aspirin daily. Anemia of chronic disease   Secondary to history multiple medical problems including squamous cell lung cancer.   No signs of active bleed. Hemoglobin stable with no indication for transfusion.   Hemoglobin range 7.4 - 7.6 Benign prostatic hypertrophy   Hold Finasteride and Flomax due to low BP Severe protein calorie malnutrition  Secondary to multiple medical comorbidities  Code Status: Full  Family Communication: Family not at bedside..  Disposition Plan: home versus SNF when stable  Medical Consultants:  Dr. Olga Millers, Cardiology.  Dr. Lonia Farber, PCCM Other Consultants:  Physical therapy: Skilled nursing home placement versus home with 24-hour supervision. Active Antibiotics:  Vancomycin PO 09/02/2012---> C. diff Flagyl 09/02/2012----> C.diff Fluconazole 09/09/12 --> Yeast UTI Levaquin 09/07/12---> Pneumonia Discontinued antibiotics IV Vancomycin 09/03/12---> 4/414 Cefepime 09/07/12---> 09/10/12   [name], MD  [facility]  Pager [phone]   If 7PM-7AM, please contact night-coverage [email or web address] Password [facility] 09/11/2012, 5:36 AM   LOS: 9 days    HPI/Subjective: No acute overnight events.  Objective: Filed Vitals:   09/10/12 2224 09/11/12 0000 09/11/12 0200 09/11/12 0400  BP: 115/76 105/65 106/69 96/53  Pulse: 119 82 80 88  Temp:  97.4 F (36.3 C)  97.5 F (36.4 C)  TempSrc:  Oral  Oral  Resp:  14 16 19   Height:      Weight:    87 kg (191 lb 12.8 oz)  SpO2:  97% 97% 92%    Intake/Output Summary (Last 24 hours) at 09/11/12 0536 Last data filed at 09/11/12 0046  Gross per 24 hour  Intake   1210 ml  Output    880 ml  Net    330 ml    Exam:   General:  Pt is alert, follows commands appropriately, not in acute  distress  Cardiovascular: irregular rhythm, rate controlled, S1/S2 appreciated  Respiratory: diminished breath sounds  Abdomen: Soft, non tender, non distended, bowel sounds present, no guarding  Extremities: LE edema, pulses DP and PT palpable bilaterally  Neuro: Grossly nonfocal  Data Reviewed: Basic Metabolic Panel:  Recent Labs Lab 09/07/12 0500 09/08/12 0335 09/09/12 0340 09/10/12 0105 09/11/12 0400  NA 134* 132* 131* 130* 136  K 4.0 4.0 4.3 4.4 4.6  CL 102 101 99 99 106  CO2 23 26 25 24 23   GLUCOSE 136* 110* 258* 184* 63*  BUN 45* 43* 54* 57* 53*  CREATININE 1.13 1.19 1.19 1.12 1.13  CALCIUM 7.5* 7.5* 7.6* 7.5* 7.7*  MG  --   --  1.8  --   --   PHOS  --   --  2.7  --   --    Liver Function Tests:  Recent Labs Lab 09/11/12 0400  AST 43*  ALT 28  ALKPHOS 72  BILITOT 0.2*  PROT 4.2*  ALBUMIN 1.7*   CBC:  Recent Labs Lab 09/07/12 0500 09/08/12 0335 09/09/12 0340 09/10/12 0105 09/11/12 0400  WBC 11.9* 8.4 6.8 9.2 10.2  HGB 7.3* 7.7* 7.4* 7.6* 7.6*  HCT 21.7* 23.2* 22.4* 22.5* 22.7*  MCV 78.1 78.6 78.3 78.1 79.6  PLT 255 229 222 230 195   CBG:  Recent Labs Lab 09/09/12 2115 09/10/12 0558 09/10/12  0748 09/10/12 1132 09/10/12 2200  GLUCAP 204* 128* 124* 275* 148*    CLOSTRIDIUM DIFFICILE BY PCR     Status: Abnormal   Collection Time    09/03/12 11:27 PM      Result Value Range Status   C difficile by pcr POSITIVE (*) NEGATIVE Final   Comment: CRITICAL RESULT CALLED TO, READ BACK BY AND VERIFIED WITH:     RICHARDSON,N RN 09/03/12 1410 WOOTEN,K  URINE CULTURE     Status: None   Collection Time    09/07/12  4:26 PM      Result Value Range Status   Specimen Description URINE, CLEAN CATCH   Final   Colony Count >=100,000 COLONIES/ML   Final   Culture YEAST   Final   Report Status 09/08/2012 FINAL   Final  MRSA PCR SCREENING     Status: None   Collection Time    09/07/12  6:46 PM      Result Value Range Status   MRSA by PCR NEGATIVE   NEGATIVE Final     Studies: Dg Chest Port 1 View 09/10/2012  *  IMPRESSION: No significant change in diffuse airspace disease and suboptimal aeration.   Original Report Authenticated By: Dwyane Dee, M.D.     Scheduled Meds: . aspirin EC  81 mg Oral QODAY  . budesonide-formoterol  2 puff Inhalation BID  . digoxin  0.125 mg Oral Daily  . feeding supplement  237 mL Oral BID BM  . fluconazole (DIFLUCAN)  100 mg Intravenous Q24H  . furosemide  40 mg Intravenous Once  . insulin aspart  0-20 Units Subcutaneous TID WC  . insulin aspart  0-5 Units Subcutaneous QHS  . insulin aspart  8 Units Subcutaneous TID WC  . insulin detemir  30 Units Subcutaneous QHS  . levofloxacin  500 mg Oral Daily  . metoprolol tartrate  25 mg Oral BID  . metronidazole  500 mg Intravenous Q8H  . multivitamin  1 tablet Oral Daily  . predniSONE  60 mg Oral Q breakfast  . vancomycin  125 mg Oral Q6H  . Warfarin    Does not apply q1800

## 2012-09-12 ENCOUNTER — Encounter: Payer: Self-pay | Admitting: Radiation Oncology

## 2012-09-12 LAB — CBC
MCH: 26.5 pg (ref 26.0–34.0)
Platelets: 237 10*3/uL (ref 150–400)
RBC: 3.28 MIL/uL — ABNORMAL LOW (ref 4.22–5.81)
RDW: 18.9 % — ABNORMAL HIGH (ref 11.5–15.5)
WBC: 11.9 10*3/uL — ABNORMAL HIGH (ref 4.0–10.5)

## 2012-09-12 LAB — GLUCOSE, CAPILLARY
Glucose-Capillary: 77 mg/dL (ref 70–99)
Glucose-Capillary: 136 mg/dL — ABNORMAL HIGH (ref 70–99)
Glucose-Capillary: 185 mg/dL — ABNORMAL HIGH (ref 70–99)
Glucose-Capillary: 257 mg/dL — ABNORMAL HIGH (ref 70–99)
Glucose-Capillary: 234 mg/dL — ABNORMAL HIGH (ref 70–99)

## 2012-09-12 LAB — BASIC METABOLIC PANEL
CO2: 24 mEq/L (ref 19–32)
Calcium: 7.7 mg/dL — ABNORMAL LOW (ref 8.4–10.5)
GFR calc Af Amer: 66 mL/min — ABNORMAL LOW (ref >90)
GFR calc non Af Amer: 57 mL/min — ABNORMAL LOW (ref >90)
Sodium: 129 mEq/L — ABNORMAL LOW (ref 135–145)

## 2012-09-12 NOTE — Progress Notes (Addendum)
Brandon Clay is an 78 y.o. 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 difficile colitis requiring PO vancomycin, atrial fibrillation on chronic Coumadin, 3 vessel CAD with chronic occlusion of RCA, severe stenosis in proximal LCx & moderately severe disease in mid LAD, S/P PTCA, on ASA and Brilinta (Plavix nonresponder), recent admission 07/24/12-08/02/12 for treatment of C. Diff who was sent to the hospital from radiation for evaluation of generalized weakness, vomiting and diarrhea. Patient was receiving stereotactic body radiotherapy (#4 / 5 planned treatments) and was noticed to be very weak and dizzy upon standing up. On admission, he was was noted to be in atrial fibrillation with heart rates in 150s. He also had significant leukocytosis of 32.7. He is currently being treated for recurrent Clostridium difficile infection as well as right lower extremity cellulitis, and over the course of his hospital stay, has shown gradual improvement in his clinical status.  Echo 07/25/12: Left ventricle: The cavity size was normal. There was mild to moderate concentric hypertrophy. Systolic function was normal. The estimated ejection fraction was in the range of 60% to 65%. Wall motion was normal; there were no regional wall motion abnormalities. - Ventricular septum: Mild septal flattening. - Left atrium: The atrium was mildly dilated. - Right atrium: The atrium was mildly dilated. - Atrial septum: No defect or patent foramen ovale was identified.  His HR is still elevated but is improving on current meds.  He is feeling much better and wants to go home.    Filed Vitals:   09/12/12 0114 09/12/12 0200 09/12/12 0400 09/12/12 0600  BP: 90/56 84/56 108/57 123/90  Pulse: 94 101 112 105  Temp:   97.8 F (36.6 C)   TempSrc:   Oral   Resp: 16 14 17 17   Height:      Weight:      SpO2: 98% 92% 92% 93%    Intake/Output Summary (Last 24 hours) at 09/12/12  0820 Last data filed at 09/12/12 0700  Gross per 24 hour  Intake   1990 ml  Output   2220 ml  Net   -230 ml    SUBJECTIVE No chest pain or dyspnea.  Heart rate improved today after addition of oral diltiazem yesterday. LABS: Basic Metabolic Panel:  Recent Labs  40/98/11 0400 09/12/12 0348  NA 136 129*  K 4.6 5.1  CL 106 100  CO2 23 24  GLUCOSE 63* 296*  BUN 53* 53*  CREATININE 1.13 1.21  CALCIUM 7.7* 7.7*   Liver Function Tests:  Recent Labs  09/11/12 0400  AST 43*  ALT 28  ALKPHOS 72  BILITOT 0.2*  PROT 4.2*  ALBUMIN 1.7*   No results found for this basename: LIPASE, AMYLASE,  in the last 72 hours CBC:  Recent Labs  09/11/12 0400 09/12/12 0348  WBC 10.2 11.9*  HGB 7.6* 8.7*  HCT 22.7* 26.1*  MCV 79.6 79.6  PLT 195 237   Hemoglobin A1C: No results found for this basename: HGBA1C,  in the last 72 hours Fasting Lipid Panel: No results found for this basename: CHOL, HDL, LDLCALC, TRIG, CHOLHDL, LDLDIRECT,  in the last 72 hours Thyroid Function Tests: No results found for this basename: TSH, T4TOTAL, FREET3, T3FREE, THYROIDAB,  in the last 72 hours Radiology/Studies:  Dg Chest Port 1 View  09/03/2012  *RADIOLOGY REPORT*  Clinical Data: Pulmonary edema.  History of lung cancer.  PORTABLE CHEST - 1 VIEW  Comparison: PA and  lateral chest 07/24/2012.  Findings: Left upper lobe mass is again seen measuring approximately 4.4 cm cranial-caudal, not markedly changed.  The lungs are emphysematous.  Bilateral alveolar and interstitial opacities have worsened.  There is cardiomegaly.  No pneumothorax or pleural fluid.  IMPRESSION:  1.  Bilateral alveolar and interstitial opacities superimposed on chronic change are likely due to pulmonary edema in this patient with cardiomegaly. Multi focal infection or interstitial tumor spread are felt less likely. 2.  Left upper lobe mass.   Original Report Authenticated By: Holley Dexter, M.D.     PHYSICAL EXAM BP 123/90  Pulse  105  Temp(Src) 97.8 F (36.6 C) (Oral)  Resp 17  Ht 5\' 10"  (1.778 m)  Wt 191 lb 12.8 oz (87 kg)  BMI 27.52 kg/m2  SpO2 93%  General: Elderly, awake and alert today Head: Normal Neck: Negative for carotid bruits. JVD not elevated. Lungs: fine rales. Heart: IRRR S1 S2 without murmurs, tachycardic - 105 Abdomen: Soft, non-tender, non-distended with normoactive bowel sounds. No hepatomegaly. No rebound/guarding. No obvious abdominal masses. Extremities: trace edema.  Distal pedal pulses are 2+ and equal bilaterally. Neuro: nonfocal  TELEMETRY: Telemetry shows atrial fib with slightly tachycardic ventricular response.  O2 sats - 94% at rest.   ASSESSMENT AND PLAN: 1. Atrial fibrillation with RVR.  He is tachycardic this am but is improving.  I think he can go to tele.  He may be close to baseline.   He has made great progress.   His HR remains a bit fast but this may be his new baseline.   He could probably go home on these current meds   His rapid ventricular response may be due to other (non-cardiac) issues - anemia, lung disease, lung cancer, c-diff, etc.  We will sign off.  Call for questions.   2. CAD s/p BMS 07/12/12. Continue ASA 81 mg daily. 3. C. Diff colitis. 4. RLE cellulitis. 5. Lung CA. Poor candidate for resection. 6. DM 7. COPD.  - he is still hypoxic at rest.  PCCM to address. 8. Dyspnea - multifactorial - COPD, lung cancer, ? diastolic dysfunction ( he has normal LV systolic function_)  Principal Problem:   Generalized weakness with diarrhea, vomiting, and abdominal pain in the setting of a recent history of Clostridium difficile colitis Active Problems:   Type 2 diabetes mellitus with vascular disease   Benign prostatic hypertrophy   Squamous cell lung cancer left upper lobe   COPD (chronic obstructive pulmonary disease)   CAD (coronary artery disease), native coronary artery   Anemia of chronic disease   Leukocytosis   C. difficile colitis   Rapid atrial  fibrillation   Cellulitis of right lower extremity   Hypokalemia   Metabolic acidosis   Hypotension, unspecified   Acute respiratory failure   Acute diastolic CHF (congestive heart failure)   Radiation pneumonitis   Amiodarone pulmonary toxicity   HCAP (healthcare-associated pneumonia)   Severe protein-calorie malnutrition    Vesta Mixer, Montez Hageman., MD, [facility] 09/12/2012, 8:29 AM Office - [phone] Pager [phone]

## 2012-09-12 NOTE — Progress Notes (Signed)
Met with Pt and wife at bedside.  Pt and wife in agreement that Pt will be able to come home.  Wife stated that Pt had help through a [facility] agency prior to his admission at [facility] and that they would like for this to resume upon d/c.  Wife and Pt stated that their daughter, [name], will know the name of the [facility] agency, if the [facility] needs this information.  CSW thanked Pt and wife for their time.  No further CSW needs.  CSW to sign off.  Providence Crosby, LCSWA Clinical Social Work [phone]

## 2012-09-12 NOTE — Progress Notes (Signed)
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 difficile colitis requiring PO vancomycin, atrial fibrillation on chronic Coumadin, 3-vessel CAD with chronic occlusion of RCA, severe stenosis in proximal LCx & moderately severe disease in mid LAD, S/P PTCA, on ASA and Brilinta (Plavix nonresponder), recent admission 07/24/12-08/02/12 for treatment of C. Diff who was sent to the hospital from radiation for evaluation of generalized weakness, vomiting and diarrhea. Patient was receiving stereotactic body radiotherapy, his 4th treatment and was noticed to be very weak and dizzy upon standing up. On admission, he was was noted to be in atrial fibrillation with heart rates in 150s. He also had significant leukocytosis of 32.7. He is currently being treated for recurrent Clostridium difficile infection as well as right lower extremity cellulitis. After initially showing some improvement in his clinical status, his condition deteriorated on 09/07/12 requiring transfer back to the ICU for acute respiratory failure. Patient is slowly improving, cardiology and PCCM assisting the management.   Assessment/Plan:   Principal Problem:  *Severe sepsis in the setting of Recurrent C. Difficile colitis and yeast UTI  C. difficile PCR positive. Continue IV flagyl and Vanco PO  Nausea, vomiting and diarrhea is resolved  WBC count slightly up which is probably due to steroids. Procalcitonin only mildly elevated at 0.24 Urine culture from 4/2 growing yeast but final report is not yet available. Continue fluconazole IV. Active Problems:  Tachycardia/bradycardia  Now on low dose metoprolol and on digoxin Heart rate 94 Acute hypoxic respiratory failure  Secondary to acute diastolic CHF and volume overload / HCAP and radiation pneumonitis   BNP trended down from 25,958 to 12,670 (to 12,074. Due to low urine output lasix 40 mg IV given 4/5. Contineu daily weight monitoring and strict intake and output.  Respiratory status is stable. Oxygen saturation ranges 83 - 100% in past 24 hours. CXR repeated 09/10/2012 with similar findings of persistent opacities. Patient was initially on Levaquin, Cefepime and Vanco IV but now all discontinued except for Levaquin Empiric steroids started by [facility] for radiation pneumonitis. Prednisone 60 mg daily.  Supratherapeutic INR  Vitamin K 0.5 mg given once 09/04/2012.  INR continues to be in the therapeutic range  Coumadin per pharmacy  Metabolic acidosis  Secondary to bicarbonate losses from GI losses  Resolved, bicarb now 24 Right lower extremity cellulitis  Improved. Was on vancomycin but this is now discontinued.  Atrial fibrillation with RVR  Initially treated with Cardizem drip and metoprolol. Cardizem drip discontinued secondary to low blood pressure and given digoxin on 09/02/2012. Was on amiodarone drip which is now discontinued due to toxicity.  Currently on digoxin 0.125 mg PO daily  Continue metoprolol 25 mg PO BID  Coumadin per pharmacy  Leukocytosis  Secondary to HCAP, C.diff colitis, steroids  H/O Hypertension / Hypotension  Continue metoprolol Hypercholesterolemia  Statin intolerant  Diabetes, type II and with complications / hypoglycemia  Hemoglobin A1c 6.7%.  CBG's in past 24 hours: 48, 59, 70 Metformin currently on hold.  Continue sliding scale Squamous cell lung cancer left upper lobe  Completed stereotactic body radiation 09/07/12.  Not a candidate for lung resection due to multiple medical comorbidities COPD (chronic obstructive pulmonary disease)  Continue Symbicort. Continue prednisone 60 mg daily  CAD (coronary artery disease), native coronary artery  Off of Brilinta per cardiology recommendations.  Continue aspirin daily.  Anemia of chronic disease  Secondary to  history multiple medical problems including squamous cell lung cancer.  No signs of active bleed. Hemoglobin stable with no indication for transfusion.  Hemoglobin range 7.4 - 8.7 Benign prostatic hypertrophy  Hold Finasteride and Flomax due to low BP Severe protein calorie malnutrition  Secondary to multiple medical comorbidities   Code Status: Full  Family Communication: Family not at bedside..  Disposition Plan: home versus SNF when stable    Medical Consultants:  Dr. Olga Millers, Cardiology.  Dr. Lonia Farber, PCCM Other Consultants:  Physical therapy: Skilled nursing home placement versus home with 24-hour supervision. Active Antibiotics:  Vancomycin PO 09/02/2012---> C. diff  Flagyl 09/02/2012----> C.diff  Fluconazole 09/09/12 --> Yeast UTI  Levaquin 09/07/12---> Pneumonia Discontinued antibiotics  IV Vancomycin 09/03/12---> 4/414  Cefepime 09/07/12---> 09/10/12   [name], MD  [facility]  Pager [phone]     If 7PM-7AM, please contact night-coverage [email or web address] Password [email or web address] 09/12/2012, 6:59 AM   LOS: 10 days    HPI/Subjective: No acute overnight events.  Objective: Filed Vitals:   09/11/12 2259 09/12/12 0000 09/12/12 0114 09/12/12 0400  BP: 119/73  90/56   Pulse: 115  94   Temp:  97.5 F (36.4 C)  97.8 F (36.6 C)  TempSrc:  Oral  Oral  Resp:   16   Height:      Weight:      SpO2:   98%     Intake/Output Summary (Last 24 hours) at 09/12/12 0659 Last data filed at 09/12/12 0538  Gross per 24 hour  Intake   1770 ml  Output   2250 ml  Net   -480 ml    Exam:   General:  Pt is sleeping, not in acute distress  Cardiovascular: irregular rhythm, rate controlled, S1/S2 appreciated  Respiratory: Clear to auscultation bilaterally, no wheezing  Abdomen: Soft, non tender, non distended, bowel sounds present, no guarding  Extremities: No edema, pulses DP and PT palpable bilaterally  Neuro: Grossly nonfocal  Data Reviewed: Basic Metabolic  Panel:  Recent Labs Lab 09/08/12 0335 09/09/12 0340 09/10/12 0105 09/11/12 0400 09/12/12 0348  NA 132* 131* 130* 136 129*  K 4.0 4.3 4.4 4.6 5.1  CL 101 99 99 106 100  CO2 26 25 24 23 24   GLUCOSE 110* 258* 184* 63* 296*  BUN 43* 54* 57* 53* 53*  CREATININE 1.19 1.19 1.12 1.13 1.21  CALCIUM 7.5* 7.6* 7.5* 7.7* 7.7*  MG  --  1.8  --   --   --   PHOS  --  2.7  --   --   --    Liver Function Tests:  Recent Labs Lab 09/11/12 0400  AST 43*  ALT 28  ALKPHOS 72  BILITOT 0.2*  PROT 4.2*  ALBUMIN 1.7*   No results found for this basename: LIPASE, AMYLASE,  in the last 168 hours No results found for this basename: AMMONIA,  in the last 168 hours CBC:  Recent Labs Lab 09/08/12 0335 09/09/12 0340 09/10/12 0105 09/11/12 0400 09/12/12 0348  WBC 8.4 6.8 9.2 10.2 11.9*  HGB 7.7* 7.4* 7.6* 7.6* 8.7*  HCT 23.2* 22.4* 22.5* 22.7* 26.1*  MCV 78.6 78.3 78.1 79.6 79.6  PLT 229 222 230 195 237   Cardiac Enzymes: No results found for this basename: CKTOTAL, CKMB, CKMBINDEX, TROPONINI,  in the last 168 hours BNP: No components found with this basename: POCBNP,  CBG:  Recent Labs Lab 09/10/12 2200 09/11/12 0801 09/11/12 0803 09/11/12 4540  09/11/12 0901  GLUCAP 148* 37* 48* 59* 70    Recent Results (from the past 240 hour(s))  MRSA PCR SCREENING     Status: None   Collection Time    09/02/12 10:21 PM      Result Value Range Status   MRSA by PCR NEGATIVE  NEGATIVE Final   Comment:            The GeneXpert MRSA Assay (FDA     approved for NASAL specimens     only), is one component of a     comprehensive MRSA colonization     surveillance program. It is not     intended to diagnose MRSA     infection nor to guide or     monitor treatment for     MRSA infections.  CLOSTRIDIUM DIFFICILE BY PCR     Status: Abnormal   Collection Time    09/03/12 11:27 PM      Result Value Range Status   C difficile by pcr POSITIVE (*) NEGATIVE Final   Comment: CRITICAL RESULT  CALLED TO, READ BACK BY AND VERIFIED WITH:     RICHARDSON,N RN 09/03/12 1410 WOOTEN,K  URINE CULTURE     Status: None   Collection Time    09/07/12  4:26 PM      Result Value Range Status   Specimen Description URINE, CLEAN CATCH   Final   Special Requests NONE   Final   Culture  Setup Time 09/07/2012 21:17   Final   Colony Count >=100,000 COLONIES/ML   Final   Culture YEAST   Final   Report Status 09/08/2012 FINAL   Final  MRSA PCR SCREENING     Status: None   Collection Time    09/07/12  6:46 PM      Result Value Range Status   MRSA by PCR NEGATIVE  NEGATIVE Final   Comment:            The GeneXpert MRSA Assay (FDA     approved for NASAL specimens     only), is one component of a     comprehensive MRSA colonization     surveillance program. It is not     intended to diagnose MRSA     infection nor to guide or     monitor treatment for     MRSA infections.     Studies: No results found.  Scheduled Meds: . aspirin EC  81 mg Oral QODAY  . budesonide-formoterol  2 puff Inhalation BID  . digoxin  0.125 mg Oral Daily  . feeding supplement  237 mL Oral BID BM  . fluconazole (DIFLUCAN) IV  100 mg Intravenous Q24H  . furosemide  40 mg Intravenous Once  . insulin aspart  0-20 Units Subcutaneous TID WC  . insulin aspart  0-5 Units Subcutaneous QHS  . levofloxacin  500 mg Oral Daily  . metoprolol tartrate  25 mg Oral BID  . metronidazole  500 mg Intravenous Q8H  . multivitamin with minerals  1 tablet Oral Daily  . predniSONE  60 mg Oral Q breakfast  . sodium chloride  500 mL Intravenous Once  . sodium chloride  3 mL Intravenous Q12H  . vancomycin  125 mg Oral Q6H  . Warfarin - Pharmacist Dosing Inpatient   Does not apply q1800   Continuous Infusions: . sodium chloride 20 mL/hr at 09/09/12 2116

## 2012-09-12 NOTE — Progress Notes (Signed)
ANTICOAGULATION CONSULT NOTE - Follow Up  Pharmacy Consult for Coumadin Indication: atrial fibrillation  No Known Allergies  Labs:  Recent Labs  09/10/12 0105 09/11/12 0400 09/12/12 0348  HGB 7.6* 7.6* 8.7*  HCT 22.5* 22.7* 26.1*  PLT 230 195 237  LABPROT 25.2* 25.2* 22.3*  INR 2.42* 2.42* 2.05*  CREATININE 1.12 1.13 1.21    Estimated Creatinine Clearance: 54.5 ml/min (by C-G formula based on Cr of 1.21).   Assessment: 78 yo M on chronic Coumadin 4mg /day for h/o atrial fibrillation. Coumadin held prior to admission due to supratherapeutic INR, resumed 3/30.   INR therapeutic but trended down. Reduced warfarin dosage requirements likely due to drug interactions (particularly fluconazole and metronidazole but also levofloxacin and prednisone) and limited dietary intake.  CBC improved.   Tolerating diet.  Goal of Therapy:  INR 2-3 Monitor platelets by anticoagulation protocol: Yes   Plan:   Give Coumadin 2mg  PO x 1 today.  Daily PT/INR.  Charolotte Eke, PharmD, pager [phone]. 09/12/2012,9:57 AM.

## 2012-09-12 NOTE — Progress Notes (Signed)
  Radiation Oncology         [phone] ________________________________  Name: MALAKYE NOLDEN MRN: [id number]  Date: 09/12/2012  DOB: [birth year]  End of Treatment Note  Diagnosis:      Squamous cell lung cancer, left. Clinical Stage I-B  Indication for treatment:  Definitive,  the patient was not an operative candidate       Radiation treatment dates:   08/23/2012 through 09/07/2012  Site/dose:   Left upper chest region 60 Gy in 5 fractions (12 Gy per fraction)  Beams/energy:   SBRT techniques  Narrative: The patient tolerated radiation treatment relatively well.   He did receive his last treatment as an inpatient. He was admitted for his atrial fibrillation as well as recurrent C. difficile infection  Plan: The patient has completed radiation treatment. The patient will return to radiation oncology clinic for routine followup in one month. I advised them to call or return sooner if they have any questions or concerns related to their recovery or treatment.  -----------------------------------  [name], PhD, MD

## 2012-09-13 LAB — CBC
MCH: 26.1 pg (ref 26.0–34.0)
MCHC: 32.7 g/dL (ref 30.0–36.0)
MCV: 79.9 fL (ref 78.0–100.0)
Platelets: 206 10*3/uL (ref 150–400)
RDW: 19.2 % — ABNORMAL HIGH (ref 11.5–15.5)

## 2012-09-13 LAB — GLUCOSE, CAPILLARY
Glucose-Capillary: 369 mg/dL — ABNORMAL HIGH (ref 70–99)
Glucose-Capillary: 303 mg/dL — ABNORMAL HIGH (ref 70–99)

## 2012-09-13 LAB — BASIC METABOLIC PANEL
Calcium: 7.6 mg/dL — ABNORMAL LOW (ref 8.4–10.5)
Creatinine, Ser: 1.12 mg/dL (ref 0.50–1.35)
GFR calc non Af Amer: 62 mL/min — ABNORMAL LOW (ref >90)
Sodium: 132 mEq/L — ABNORMAL LOW (ref 135–145)

## 2012-09-13 LAB — PROTIME-INR
INR: 1.76 — ABNORMAL HIGH (ref 0.00–1.49)
Prothrombin Time: 19.9 seconds — ABNORMAL HIGH (ref 11.6–15.2)

## 2012-09-13 MED ORDER — METRONIDAZOLE 500 MG PO TABS
500.0000 mg | ORAL_TABLET | Freq: Three times a day (TID) | ORAL | Status: DC
  Administered 2012-09-13 – 2012-09-17 (×12): 500 mg via ORAL
  Filled 2012-09-13 (×15): qty 1

## 2012-09-13 MED ORDER — WARFARIN SODIUM 2 MG PO TABS
2.0000 mg | ORAL_TABLET | Freq: Once | ORAL | Status: AC
  Administered 2012-09-13: 2 mg via ORAL
  Filled 2012-09-13: qty 1

## 2012-09-13 MED ORDER — FUROSEMIDE 10 MG/ML IJ SOLN
40.0000 mg | Freq: Once | INTRAMUSCULAR | Status: AC
  Administered 2012-09-13: 40 mg via INTRAVENOUS
  Filled 2012-09-13: qty 4

## 2012-09-13 MED ORDER — FLUCONAZOLE 100 MG PO TABS
100.0000 mg | ORAL_TABLET | Freq: Every day | ORAL | Status: DC
  Administered 2012-09-14 – 2012-09-17 (×4): 100 mg via ORAL
  Filled 2012-09-13 (×4): qty 1

## 2012-09-13 NOTE — Progress Notes (Signed)
Physical Therapy Treatment Patient Details Name: Brandon Clay MRN: [id number] DOB: 1934/10/06 Today's Date: 09/13/2012 Time: 3474-2595 PT Time Calculation (min): 31 min  PT Assessment / Plan / Recommendation Comments on Treatment Session  Pt overall weaker during this session, however RN states he had been in chair for 3 hours and had just gotten to bed.  Pt very motivated to get stronger, but at this time is still requiring +2 assist to even stand.  HR also continues to go up to 150's and SaO2 down to 84% at lowest on 3L O2. Discussed D/C plans with pt as RN states he now wants to D/C home.  Spoke with pt and how he will be much safer and benefit greatly from 24/7 care/assist at SNF for increased therapy for safer D/C to home eventually.  He seems to understand and had CSW speak with him following session.     Follow Up Recommendations  SNF     Does the patient have the potential to tolerate intense rehabilitation     Barriers to Discharge        Equipment Recommendations  Rolling walker with 5" wheels;Wheelchair (measurements PT);Wheelchair cushion (measurements PT)    Recommendations for Other Services    Frequency Min 3X/week   Plan Discharge plan remains appropriate;Frequency remains appropriate    Precautions / Restrictions Precautions Precautions: Fall Precaution Comments: O2 dependent, HR up into 150's, RN aware. Restrictions Weight Bearing Restrictions: No   Pertinent Vitals/Pain Pt denies pain, however fatigues very quickly.     Mobility  Bed Mobility Bed Mobility: Supine to Sit;Sit to Supine;Sitting - Scoot to Edge of Bed Supine to Sit: 3: Mod assist;HOB elevated;With rails Sitting - Scoot to Edge of Bed: 4: Min assist Sit to Supine: 4: Min assist;3: Mod assist;HOB flat Details for Bed Mobility Assistance: Pt able to bring LEs off EOB and reach for handrail with cues, however did require some assist for trunk to get into upright posture.  Transfers Transfers: Sit  to Stand;Stand to Sit Sit to Stand: 1: +2 Total assist;From bed;With upper extremity assist;From elevated surface;From chair/3-in-1;With armrests Sit to Stand: Patient Percentage: 40% Stand to Sit: 1: +2 Total assist;With armrests;To bed;To chair/3-in-1 Stand to Sit: Patient Percentage: 40% Stand Pivot Transfers: 1: +2 Total assist Stand Pivot Transfers: Patient Percentage: 40% Details for Transfer Assistance: Performed x 3 in order to take several steps then rest in recliner with MAX cues for hand placement, tactile tapping for quad facilitation and to use momentum when standing (esp from recliner, as it was a lower surface).   Ambulation/Gait Ambulation/Gait Assistance: 1: +2 Total assist Ambulation/Gait: Patient Percentage: 40% Ambulation Distance (Feet): 3 Feet (x 2 ) Assistive device: Rolling walker Ambulation/Gait Assistance Details: Pt only able to take a few steps then rest (x 2) with +2 to maintain upright stance and MAX cues (verbal and tactile) for quad/glute activation, however pt still with very flexed posture and recliner had to be VERY close for safety.  Gait Pattern: Step-to pattern    Exercises General Exercises - Lower Extremity Ankle Circles/Pumps: AROM;Both;10 reps Quad Sets: AROM;Both;15 reps (educated to do this 3x day)   PT Diagnosis:    PT Problem List:   PT Treatment Interventions:     PT Goals Acute Rehab PT Goals PT Goal Formulation: With patient/family Time For Goal Achievement: 09/20/12 Potential to Achieve Goals: Good Pt will go Supine/Side to Sit: with min assist PT Goal: Supine/Side to Sit - Progress: Revised due to lack of progress  Pt will go Sit to Stand: with mod assist PT Goal: Sit to Stand - Progress: Revised due to lack of progress Pt will Transfer Bed to Chair/Chair to Bed: with max assist PT Transfer Goal: Bed to Chair/Chair to Bed - Progress: Revised due to lack of progress Pt will Ambulate: 1 - 15 feet;with max assist;with least restrictive  assistive device PT Goal: Ambulate - Progress: Revised due to lack of progress  Visit Information  Last PT Received On: 09/13/12 Assistance Needed: +2    Subjective Data  Subjective: I feel better now (after getting up) Patient Stated Goal: home   Cognition  Cognition Overall Cognitive Status: Appears within functional limits for tasks assessed/performed Arousal/Alertness: Awake/alert Orientation Level: Appears intact for tasks assessed Behavior During Session: [facility] for tasks performed    Balance     End of Session PT - End of Session Equipment Utilized During Treatment: Oxygen Activity Tolerance: Patient limited by fatigue Patient left: in bed;with call bell/phone within reach Nurse Communication: Mobility status   GP     Vista Deck 09/13/2012, 3:48 PM

## 2012-09-13 NOTE — Progress Notes (Signed)
Patient complaining of extreme bladder discomfort and being unable to void since foley cath removed at 14:15.  Bladder scan showed greater than 200 cc.  In and out cath performed and 750 cc of amber urine obtained.  Patient expressed relief.  Will continue to monitor patient. [name], Iyani Dresner Cherie

## 2012-09-13 NOTE — Progress Notes (Signed)
CSW spoke with patient & his daugher, [name] (cell#: [phone]) re: SNF vs. Home with Home Health. Patient had just finished working with Physical Therapy & they recommended SNF. Patient states that he would be possibly be agreeable, requesting [facility] (though they are unsure whether they will have a private room available), also awaiting call back from [facility]. Patient's insurance will require prior authorization ([employer]), which will be initiated once bed offers have been made & patient chooses a facility.   Clinical Social Work Department CLINICAL SOCIAL WORK PLACEMENT NOTE 09/13/2012  Patient:  Brandon Clay, Brandon Clay  Account Number:  [id number] Admit date:  09/02/2012  Clinical Social Worker:  Orpah Greek  Date/time:  09/13/2012 03:56 PM  Clinical Social Work is seeking post-discharge placement for this patient at the following level of care:   SKILLED NURSING   (*CSW will update this form in Epic as items are completed)   09/13/2012  Patient/family provided with [facility] Department of Clinical Social Work's list of facilities offering this level of care within the geographic area requested by the patient (or if unable, by the patient's family).  09/13/2012  Patient/family informed of their freedom to choose among providers that offer the needed level of care, that participate in Medicare, Medicaid or managed care program needed by the patient, have an available bed and are willing to accept the patient.  09/13/2012  Patient/family informed of [facility]' ownership interest in [facility], as well as of the fact that they are under no obligation to receive care at this facility.  PASARR submitted to EDS on 09/13/2012 PASARR number received from EDS on 09/13/2012  FL2 transmitted to all facilities in geographic area requested by pt/family on  09/13/2012 FL2 transmitted to all facilities within larger geographic area on   Patient  informed that his/her managed care company has contracts with or will negotiate with  certain facilities, including the following:     Patient/family informed of bed offers received:   Patient chooses bed at  Physician recommends and patient chooses bed at    Patient to be transferred to  on   Patient to be transferred to facility by   The following physician request were entered in Epic:   Additional Comments:  Unice Bailey, LCSW [facility] Clinical Social Worker cell #: [phone]

## 2012-09-13 NOTE — Progress Notes (Addendum)
PULMONARY  / CRITICAL CARE MEDICINE  Name: NICKLOUS ABURTO MRN: [id number] DOB: 14-Nov-1934    ADMISSION DATE:  09/02/2012 CONSULTATION DATE:  09/07/12  REFERRING MD :  [facility] PRIMARY SERVICE: [facility]  CHIEF COMPLAINT:  dyspnea  BRIEF PATIENT DESCRIPTION:  78 yo with COPD, recently diagnosed SCLCA currently undergoing SBRT, AF recently treated with Amiodarone and CAD with ischemic cardiomyopathy who developed progressive pulmonary infiltrates, hypoxemia and respiratory distress requiring transfer to ICU. Recent C-Diff Colitis on oral Vanco. PCCM was asked to evaluate.    SIGNIFICANT EVENTS / STUDIES:  3/27 - Admitted from radiation therapy (#4/5) with weakness/dizzy, & hypoxemia / resp distress 4/01 - Transferred to ICU with hypoxia / respiratory distress  4/02 - Afib with CVR, bp soft 4/03 - soft BP, Afib with increasing rate  LINES / TUBES: PIV  CULTURES: 3/28 C. Dif PCR >>> POS  4/1 Urine >>>large leukocytes, 21-50 WBC, neg nitrate 4/1 UC>>>100k yeast 4/1 MRSA PCR >>> neg   ANTIBIOTICS: Flagyl 3/27 >>>  Vancomycin PO 3/27 >>>  Cefepime 4/1 >>> 4-4 Levaquin 4/1 >>>  Vancomycin IV 4/1 >>>4-4 Diflucan 4/3>>>  SUBJECTIVE: 4-7 not as animated today. Somewhat depressed  VITAL SIGNS: Temp:  [96.4 F (35.8 C)-98.3 F (36.8 C)] 97.3 F (36.3 C) (04/07 0800) Pulse Rate:  [90-132] 132 (04/07 0850) Resp:  [16-20] 20 (04/07 0800) BP: (78-135)/(45-100) 130/79 mmHg (04/07 0850) SpO2:  [97 %-100 %] 97 % (04/07 0808) Weight:  [84.5 kg (186 lb 4.6 oz)] 84.5 kg (186 lb 4.6 oz) (04/07 0400)      INTAKE / OUTPUT: Intake/Output     04/06 0701 - 04/07 0700 04/07 0701 - 04/08 0700   P.O. 780 240   I.V. (mL/kg) 480 (5.7) 20 (0.2)   IV Piggyback 350    Total Intake(mL/kg) 1610 (19.1) 260 (3.1)   Urine (mL/kg/hr) 1720 (0.8) 50 (0.3)   Total Output 1720 50   Net -110 +210        Stool Occurrence 1 x      PHYSICAL EXAMINATION: General:  Sitting in chairlooks better but still  deconditioned Neuro:  awake HEENT:  Dry mucus membranes Cardiovascular:  RRR Lungs:  resp's even/non-labored, soft crackles Abdomen:  Soft nt Musculoskeletal:  From, no acute deformities Skin:  Clear, no rashes / lesions, no edema  LABS:  Recent Labs Lab 09/07/12 0500 09/07/12 1520  09/08/12 0335 09/09/12 0340  09/11/12 0400 09/12/12 0348 09/13/12 0340  HGB 7.3*  --   --  7.7* 7.4*  < > 7.6* 8.7* 9.1*  WBC 11.9*  --   --  8.4 6.8  < > 10.2 11.9* 12.2*  PLT 255  --   --  229 222  < > 195 237 206  NA 134*  --   --  132* 131*  < > 136 129* 132*  K 4.0  --   --  4.0 4.3  < > 4.6 5.1 5.4*  CL 102  --   --  101 99  < > 106 100 102  CO2 23  --   --  26 25  < > 23 24 24   GLUCOSE 136*  --   --  110* 258*  < > 63* 296* 348*  BUN 45*  --   --  43* 54*  < > 53* 53* 46*  CREATININE 1.13  --   --  1.19 1.19  < > 1.13 1.21 1.12  CALCIUM 7.5*  --   --  7.5* 7.6*  < >  7.7* 7.7* 7.6*  MG  --   --   --   --  1.8  --   --   --   --   PHOS  --   --   --   --  2.7  --   --   --   --   AST  --   --   --   --   --   --  43*  --   --   ALT  --   --   --   --   --   --  28  --   --   ALKPHOS  --   --   --   --   --   --  72  --   --   BILITOT  --   --   --   --   --   --  0.2*  --   --   PROT  --   --   --   --   --   --  4.2*  --   --   ALBUMIN  --   --   --   --   --   --  1.7*  --   --   APTT  --   --   --   --  35  --   --   --   --   INR 1.81*  --   --  2.24* 2.51*  < > 2.42* 2.05* 1.76*  PROCALCITON  --   --   < > 1.21 0.65  --  0.24  --  0.13  PROBNP 15589.0*  --   --  12670.0*  --   --  12704.0*  --   --   PHART  --  7.517*  --   --   --   --   --   --   --   PCO2ART  --  29.1*  --   --   --   --   --   --   --   PO2ART  --  51.0*  --   --   --   --   --   --   --   < > = values in this interval not displayed.  Recent Labs Lab 09/12/12 0800 09/12/12 1244 09/12/12 1656 09/12/12 2139 09/13/12 0748  GLUCAP 324* 257* 234* 364* 372*    Imaging: No results found.  ASSESSMENT /  PLAN:   PULMONARY A: Acute hypoxemic resp failure with bilat infiltrates, AECOPD - Poss XRT pneumonitis, lung CA, HCap?, amiod toxic? Home O2 prior to admit P:   Taper Abx >> d/c vancomycin 4/04, cefepime 4/04 Can dc levaquin 4/8 Change to prednisone -taper slow over next 2-3 weeks F/u CXR intermittently Negative fluid balance as tolerated BDs  CARDIOVASCULAR Lab Results  Component Value Date   INR 1.76* 09/13/2012   INR 2.05* 09/12/2012   INR 2.42* 09/11/2012     A:  Ischemic CM Mild Hypotension Afib - chronic, on coumadin, RVR 4/3  P:  Per cardiology Coumadin per pharmacy Amiodarone stopped  Infectious:  See flow for tx for c diff , uti and pulmonary per IM.   Global: Pulmonary status unchanged, requires O2 and desaturates with activity. Very little for PCCM to offer. He can follow up with Dr. Marchelle Gearing as outpt. Dc foley, consider rehab  Brett Canales Minor ACNP Adolph Pollack PCCM Pager [phone] till 3 pm If  no answer page [phone]  Independently examined pt, evaluated data & formulated above care plan with NP  [facility] V.  09/13/2012, 9:10 AM

## 2012-09-13 NOTE — Progress Notes (Signed)
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 difficile colitis requiring PO vancomycin, atrial fibrillation on chronic Coumadin, 3-vessel CAD with chronic occlusion of RCA, severe stenosis in proximal LCx & moderately severe disease in mid LAD, S/P PTCA, on ASA and Brilinta (Plavix nonresponder), recent admission 07/24/12-08/02/12 for treatment of C. Diff who was sent to the hospital from radiation for evaluation of generalized weakness, vomiting and diarrhea. Patient was receiving stereotactic body radiotherapy, his 4th treatment and was noticed to be very weak and dizzy upon standing up. On admission, he was was noted to be in atrial fibrillation with heart rates in 150s. He also had significant leukocytosis of 32.7. He is currently being treated for recurrent Clostridium difficile infection as well as right lower extremity cellulitis. After initially showing some improvement in his clinical status, his condition deteriorated on 09/07/12 requiring transfer back to the ICU for acute respiratory failure. Patient is slowly improving, cardiology and PCCM assisting the management. Patient agreed to skilled nursing facility placement. Social work is assisting the discharge plan.  Assessment/Plan:   Principal Problem:  *Severe sepsis in the setting of Recurrent C. Difficile colitis and yeast UTI  C. difficile PCR positive. Continue Vanco and Flagyl by mouth  WBC count slightly up which is probably due to steroids. Procalcitonin only mildly elevated at 0.24  Urine culture from 4/2 growing yeast and final report is still not available. For now we will continue fluconazole but we will change it to by mouth regimen. Active Problems:  Tachycardia/bradycardia  Per cardiology patient may continue taking current medications,  metoprolol and digoxin. His tachycardia may be new baseline for him. Acute hypoxic respiratory failure  Secondary to acute diastolic CHF and volume overload / HCAP and radiation pneumonitis  BNP trended down from 25,958 to 12,670 to 12,074. Fluid balance is negative 1.4 L in past 24 hours. Respiratory status is stable. Oxygen saturation ranges 97 - 100% in past 24 hours.  CXR repeated 09/10/2012 with similar findings of persistent opacities. Patient was initially on Levaquin, Cefepime and Vanco IV but now all discontinued except for Levaquin  Empiric steroids started by [facility] for radiation pneumonitis. Prednisone 60 mg daily.  Supratherapeutic INR  Vitamin K 0.5 mg given once 09/04/2012.  Coumadin per pharmacy  Metabolic acidosis  Secondary to bicarbonate losses from GI losses  Resolved, bicarb now 24 Right lower extremity cellulitis  Improved. Was on vancomycin but this is now discontinued.  Atrial fibrillation with RVR  Initially treated with Cardizem drip and metoprolol. Cardizem drip discontinued secondary to low blood pressure and given digoxin on 09/02/2012. Was on amiodarone drip which is now discontinued due to toxicity.  Currently on digoxin 0.125 mg PO daily  Continue metoprolol 25 mg PO BID  Coumadin per pharmacy  Leukocytosis  Secondary to HCAP, C.diff colitis, steroids  H/O Hypertension / Hypotension  Continue metoprolol 25 mg by mouth twice a day Hypercholesterolemia  Statin intolerant  Diabetes, type II and with complications / hypoglycemia  Hemoglobin A1c 6.7%.  Metformin currently on hold.  Continue sliding scale Squamous cell lung cancer left upper lobe  Completed stereotactic body radiation 09/07/12.  Not a candidate for lung resection due to multiple medical comorbidities COPD (chronic obstructive pulmonary disease)  Continue Symbicort. Continue prednisone 60 mg daily  CAD (coronary artery disease), native coronary artery  Off of  Brilinta per cardiology  recommendations.  Continue aspirin daily. Anemia of chronic disease  Secondary to history multiple medical problems including squamous cell lung cancer.  No signs of active bleed. Hemoglobin stable with no indication for transfusion.  Hemoglobin range 7.4 - 9.1 Benign prostatic hypertrophy  Hold Finasteride and Flomax due to low BP Severe protein calorie malnutrition  Secondary to multiple medical comorbidities   Code Status: Full  Family Communication: Family not at bedside..  Disposition Plan: Likely skilled nursing facility in next 24 hours  Medical Consultants:  Dr. Olga Millers, Cardiology.  Dr. Lonia Farber, PCCM Other Consultants:  Physical therapy: Skilled nursing home placement versus home with 24-hour supervision. Active Antibiotics:  Vancomycin PO 09/02/2012---> C. diff  Flagyl 09/02/2012----> C.diff  Fluconazole 09/09/12 --> Yeast UTI  Levaquin 09/07/12---> Pneumonia Discontinued antibiotics  IV Vancomycin 09/03/12---> 4/414  Cefepime 09/07/12---> 09/10/12   [name], MD  [facility]  Pager [phone]    If 7PM-7AM, please contact night-coverage [email or web address] Password [facility] 09/13/2012, 12:43 PM   LOS: 11 days    HPI/Subjective: No acute overnight events.  Objective: Filed Vitals:   09/13/12 0800 09/13/12 0808 09/13/12 0850 09/13/12 1200  BP:   130/79 100/61  Pulse: 110  132   Temp: 97.3 F (36.3 C)   97.6 F (36.4 C)  TempSrc: Oral   Oral  Resp: 20   16  Height:      Weight:      SpO2: 99% 97%      Intake/Output Summary (Last 24 hours) at 09/13/12 1243 Last data filed at 09/13/12 1232  Gross per 24 hour  Intake   1530 ml  Output   2870 ml  Net  -1340 ml    Exam:   General:  Pt is alert, follows commands appropriately, not in acute distress  Cardiovascular: irregular rhythm, tachycardic, (+) S1, S2, S3  Respiratory: Clear to auscultation bilaterally, no wheezing, no crackles, no rhonchi  Abdomen: Soft, non tender, non distended,  bowel sounds present, no guarding  Extremities: LE edema, pulses DP and PT palpable bilaterally  Neuro: Grossly nonfocal  Data Reviewed: Basic Metabolic Panel:  Recent Labs Lab 09/09/12 0340 09/10/12 0105 09/11/12 0400 09/12/12 0348 09/13/12 0340  NA 131* 130* 136 129* 132*  K 4.3 4.4 4.6 5.1 5.4*  CL 99 99 106 100 102  CO2 25 24 23 24 24   GLUCOSE 258* 184* 63* 296* 348*  BUN 54* 57* 53* 53* 46*  CREATININE 1.19 1.12 1.13 1.21 1.12  CALCIUM 7.6* 7.5* 7.7* 7.7* 7.6*  MG 1.8  --   --   --   --   PHOS 2.7  --   --   --   --    Liver Function Tests:  Recent Labs Lab 09/11/12 0400  AST 43*  ALT 28  ALKPHOS 72  BILITOT 0.2*  PROT 4.2*  ALBUMIN 1.7*   No results found for this basename: LIPASE, AMYLASE,  in the last 168 hours No results found for this basename: AMMONIA,  in the last 168 hours CBC:  Recent Labs Lab 09/09/12 0340 09/10/12 0105 09/11/12 0400 09/12/12 0348 09/13/12 0340  WBC 6.8 9.2 10.2 11.9* 12.2*  HGB 7.4* 7.6* 7.6* 8.7* 9.1*  HCT 22.4* 22.5* 22.7* 26.1* 27.8*  MCV 78.3 78.1 79.6 79.6 79.9  PLT 222 230 195 237 206   Cardiac Enzymes: No results found for this basename: CKTOTAL, CKMB, CKMBINDEX, TROPONINI,  in the last 168 hours BNP: No components found with this basename:  POCBNP,  CBG:  Recent Labs Lab 09/12/12 1244 09/12/12 1656 09/12/12 2139 09/13/12 0748 09/13/12 1133  GLUCAP 257* 234* 364* 372* 369*    CLOSTRIDIUM DIFFICILE BY PCR     Status: Abnormal   Collection Time    09/03/12 11:27 PM      Result Value Range Status   C difficile by pcr POSITIVE (*) NEGATIVE Final   Comment: CRITICAL RESULT CALLED TO, READ BACK BY AND VERIFIED WITH:     RICHARDSON,N RN 09/03/12 1410 WOOTEN,K  URINE CULTURE     Status: None   Collection Time    09/07/12  4:26 PM      Result Value Range Status   Specimen Description URINE, CLEAN CATCH   Final   Special Requests NONE   Final   Culture  Setup Time 09/07/2012 21:17   Final   Colony Count  >=100,000 COLONIES/ML   Final   Culture YEAST   Final   Report Status 09/08/2012 FINAL   Final  MRSA PCR SCREENING     Status: None   Collection Time    09/07/12  6:46 PM      Result Value Range Status   MRSA by PCR NEGATIVE  NEGATIVE Final     Studies: No results found.  Scheduled Meds: . aspirin EC  81 mg Oral QODAY  . budesonide-formoterol  2 puff Inhalation BID  . digoxin  0.125 mg Oral Daily  . fluconazole  100 mg Oral Daily  . insulin aspart  0-20 Units Subcutaneous TID WC  . insulin aspart  0-5 Units Subcutaneous QHS  . levofloxacin  500 mg Oral Daily  . metoprolol tartrate  25 mg Oral BID  . metroNIDAZOLE  500 mg Oral Q8H  . multivitamin with miner  1 tablet Oral Daily  . predniSONE  60 mg Oral Q breakfast  . vancomycin  125 mg Oral Q6H  . Warfarin    Does not apply q1800

## 2012-09-13 NOTE — Progress Notes (Signed)
ANTICOAGULATION CONSULT NOTE - Follow Up  Pharmacy Consult for Coumadin Indication: atrial fibrillation  No Known Allergies  Labs:  Recent Labs  09/11/12 0400 09/12/12 0348 09/13/12 0340  HGB 7.6* 8.7* 9.1*  HCT 22.7* 26.1* 27.8*  PLT 195 237 206  LABPROT 25.2* 22.3* 19.9*  INR 2.42* 2.05* 1.76*  CREATININE 1.13 1.21 1.12    Estimated Creatinine Clearance: 58.8 ml/min (by C-G formula based on Cr of 1.12).   Assessment: 78 yo M on chronic Coumadin 4mg /day for h/o atrial fibrillation. Coumadin held prior to admission due to supratherapeutic INR, resumed 3/30.   INR now subtherapeutic and trending down. Reduced warfarin dosage requirements likely due to drug interactions (particularly fluconazole and metronidazole but also levofloxacin and prednisone) and limited dietary intake.  CBC improved.   Tolerating diet.  Goal of Therapy:  INR 2-3 Monitor platelets by anticoagulation protocol: Yes   Plan:   Coumadin 2mg  PO x 1 today.  Daily PT/INR.   Loralee Pacas, PharmD, BCPS Pager: [phone] 09/13/2012,7:09 AM.

## 2012-09-14 ENCOUNTER — Inpatient Hospital Stay (HOSPITAL_COMMUNITY): Payer: Medicare Other

## 2012-09-14 DIAGNOSIS — T466X5A Adverse effect of antihyperlipidemic and antiarteriosclerotic drugs, initial encounter: Secondary | ICD-10-CM

## 2012-09-14 LAB — CBC
Hemoglobin: 9.4 g/dL — ABNORMAL LOW (ref 13.0–17.0)
MCH: 26 pg (ref 26.0–34.0)
MCHC: 33.1 g/dL (ref 30.0–36.0)
Platelets: 202 10*3/uL (ref 150–400)
RBC: 3.61 MIL/uL — ABNORMAL LOW (ref 4.22–5.81)

## 2012-09-14 LAB — GLUCOSE, CAPILLARY
Glucose-Capillary: 83 mg/dL (ref 70–99)
Glucose-Capillary: 219 mg/dL — ABNORMAL HIGH (ref 70–99)
Glucose-Capillary: 113 mg/dL — ABNORMAL HIGH (ref 70–99)

## 2012-09-14 LAB — BASIC METABOLIC PANEL
CO2: 25 mEq/L (ref 19–32)
Calcium: 7.8 mg/dL — ABNORMAL LOW (ref 8.4–10.5)
GFR calc non Af Amer: 62 mL/min — ABNORMAL LOW (ref >90)
Glucose, Bld: 98 mg/dL (ref 70–99)
Potassium: 5.4 mEq/L — ABNORMAL HIGH (ref 3.5–5.1)
Sodium: 134 mEq/L — ABNORMAL LOW (ref 135–145)

## 2012-09-14 LAB — PROTIME-INR
INR: 1.87 — ABNORMAL HIGH (ref 0.00–1.49)
Prothrombin Time: 20.8 seconds — ABNORMAL HIGH (ref 11.6–15.2)

## 2012-09-14 MED ORDER — DIGOXIN 125 MCG PO TABS: 0.1250 mg | ORAL_TABLET | Freq: Every day | ORAL | Status: DC

## 2012-09-14 MED ORDER — FLUCONAZOLE 100 MG PO TABS: 100.0000 mg | ORAL_TABLET | Freq: Every day | ORAL | Status: DC

## 2012-09-14 MED ORDER — PREDNISONE 50 MG PO TABS
50.0000 mg | ORAL_TABLET | Freq: Every day | ORAL | Status: DC
  Administered 2012-09-15 – 2012-09-17 (×3): 50 mg via ORAL
  Filled 2012-09-14 (×4): qty 1

## 2012-09-14 MED ORDER — VANCOMYCIN 50 MG/ML ORAL SOLUTION: 125.0000 mg | Freq: Four times a day (QID) | ORAL | Status: DC

## 2012-09-14 MED ORDER — LEVOFLOXACIN 500 MG PO TABS: 500.0000 mg | ORAL_TABLET | Freq: Every day | ORAL | Status: DC

## 2012-09-14 MED ORDER — INSULIN DETEMIR 100 UNIT/ML ~~LOC~~ SOLN: 10.0000 [IU] | Freq: Every day | SUBCUTANEOUS | Status: DC

## 2012-09-14 MED ORDER — SODIUM POLYSTYRENE SULFONATE 15 GM/60ML PO SUSP
15.0000 g | Freq: Once | ORAL | Status: AC
  Administered 2012-09-14: 15 g via ORAL
  Filled 2012-09-14: qty 60

## 2012-09-14 MED ORDER — PREDNISONE 10 MG PO TABS: 10.0000 mg | ORAL_TABLET | Freq: Every day | ORAL | Status: DC

## 2012-09-14 MED ORDER — METOPROLOL TARTRATE 25 MG PO TABS: 25.0000 mg | ORAL_TABLET | Freq: Two times a day (BID) | ORAL | Status: DC

## 2012-09-14 MED ORDER — MAGIC MOUTHWASH W/LIDOCAINE
5.0000 mL | Freq: Four times a day (QID) | ORAL | Status: DC
  Administered 2012-09-14 – 2012-09-17 (×11): 5 mL via ORAL
  Filled 2012-09-14 (×14): qty 5

## 2012-09-14 MED ORDER — ALBUTEROL SULFATE (5 MG/ML) 0.5% IN NEBU: 2.5000 mg | INHALATION_SOLUTION | RESPIRATORY_TRACT | Status: DC | PRN

## 2012-09-14 MED ORDER — GLUCERNA SHAKE PO LIQD: 237.0000 mL | Freq: Two times a day (BID) | ORAL | Status: DC

## 2012-09-14 MED ORDER — ADULT MULTIVITAMIN W/MINERALS CH: 1.0000 | ORAL_TABLET | Freq: Every day | ORAL | Status: AC

## 2012-09-14 MED ORDER — WARFARIN SODIUM 2 MG PO TABS
2.0000 mg | ORAL_TABLET | Freq: Once | ORAL | Status: AC
  Administered 2012-09-14: 2 mg via ORAL
  Filled 2012-09-14: qty 1

## 2012-09-14 MED ORDER — PREDNISONE 10 MG PO TABS: 40.0000 mg | ORAL_TABLET | Freq: Every day | ORAL | Status: DC

## 2012-09-14 MED ORDER — WARFARIN SODIUM 2 MG PO TABS: 2.0000 mg | ORAL_TABLET | Freq: Every day | ORAL | Status: DC

## 2012-09-14 MED ORDER — METRONIDAZOLE 500 MG PO TABS: 500.0000 mg | ORAL_TABLET | Freq: Three times a day (TID) | ORAL | Status: DC

## 2012-09-14 NOTE — Progress Notes (Signed)
ANTICOAGULATION CONSULT NOTE - Follow Up  Pharmacy Consult for Coumadin Indication: atrial fibrillation  No Known Allergies  Labs:  Recent Labs  09/12/12 0348 09/13/12 0340 09/14/12 0440  HGB 8.7* 9.1* 9.4*  HCT 26.1* 27.8* 28.4*  PLT 237 206 202  LABPROT 22.3* 19.9* 20.8*  INR 2.05* 1.76* 1.87*  CREATININE 1.21 1.12 1.13    Estimated Creatinine Clearance: 58.3 ml/min (by C-G formula based on Cr of 1.13).   Assessment: 78 yo M on chronic Coumadin 4mg /day for h/o atrial fibrillation. Coumadin held prior to admission due to supratherapeutic INR, resumed 3/30.   INR remains subtherapeutic and however trending up from 4/7 INR. Reduced warfarin dosage requirements likely due to drug interactions (particularly fluconazole and metronidazole but also levofloxacin and prednisone) and limited dietary intake.  CBC improved.   Tolerating diet.  Anticipate discharge today.  Recommendation is to continue warfarin 2mg  daily with repeat check of INR on outpatient basis either Thur/Fri this week  As patient still currently in hospital, will order dose for today to be given prior to discharge  Goal of Therapy:  INR 2-3 Monitor platelets by anticoagulation protocol: Yes   Plan:   Coumadin 2mg  PO x 1 today.  Daily PT/INR.   Terrilee Files, PharmD  09/14/2012,4:11 PM.

## 2012-09-14 NOTE — Progress Notes (Addendum)
CSW spoke with patient & daughter, Brandon Clay via phone (cell#: [phone]) who has accepted bed offer @ [facility], insurance authorization submitted - awaiting approval. Patient made aware of insurance co-pays. Anticipating discharge tomorrow, pending insurance approval. Dr. Elisabeth Pigeon made aware.   Clinical Social Work Department CLINICAL SOCIAL WORK PLACEMENT NOTE 09/14/2012  Patient:  Brandon Clay, Brandon Clay  Account Number:  [id number] Admit date:  09/02/2012  Clinical Social Worker:  Orpah Greek  Date/time:  09/13/2012 03:56 PM  Clinical Social Work is seeking post-discharge placement for this patient at the following level of care:   SKILLED NURSING   (*CSW will update this form in Epic as items are completed)   09/13/2012  Patient/family provided with [facility] Department of Clinical Social Work's list of facilities offering this level of care within the geographic area requested by the patient (or if unable, by the patient's family).  09/13/2012  Patient/family informed of their freedom to choose among providers that offer the needed level of care, that participate in Medicare, Medicaid or managed care program needed by the patient, have an available bed and are willing to accept the patient.  09/13/2012  Patient/family informed of [facility]' ownership interest in [facility], as well as of the fact that they are under no obligation to receive care at this facility.  PASARR submitted to EDS on 09/13/2012 PASARR number received from EDS on 09/13/2012  FL2 transmitted to all facilities in geographic area requested by pt/family on  09/13/2012 FL2 transmitted to all facilities within larger geographic area on   Patient informed that his/her managed care company has contracts with or will negotiate with  certain facilities, including the following:     Patient/family informed of bed offers received:  09/14/2012 Patient chooses bed at [facility] Physician  recommends and patient chooses bed at    Patient to be transferred to [facility] on  09/17/12 Patient to be transferred to facility by [facility]  The following physician request were entered in Epic:   Additional Comments:  Unice Bailey, LCSW [facility] Clinical Social Worker cell #: [phone]

## 2012-09-14 NOTE — Discharge Summary (Signed)
Physician Discharge Summary  CUONG MOORMAN ZOX:[id number] DOB: 10/25/1934 DOA: 09/02/2012  PCP: [name], MD  Admit date: 09/02/2012 Discharge date: 09/14/2012  Recommendations for Outpatient Follow-up:  1. Continue vancomycin and Flagyl for 2 more days on discharge, until and including 09/16/2012 2. Continue Coumadin 2 mg at bedtime, recheck INR Thursday, 09/18/2012 and adjust Coumadin dosage as needed to keep INR in range 2-3 3. Taper prednisone starting at 40 mg a day by 5 mg a day to 0 mg and then stop. 4. Continue Levaquin for pneumonia for next 5 days 5. Continue fluconazole for 7 days on discharge for yeast UTI.  Discharge Diagnoses:  Principal Problem:   Generalized weakness with diarrhea, vomiting, and abdominal pain in the setting of a recent history of Clostridium difficile colitis Active Problems:   Type 2 diabetes mellitus with vascular disease   Squamous cell lung cancer left upper lobe   COPD (chronic obstructive pulmonary disease)   CAD (coronary artery disease), native coronary artery   Anemia of chronic disease   Leukocytosis   C. difficile colitis   Rapid atrial fibrillation   Cellulitis of right lower extremity   Hypokalemia   Metabolic acidosis   Hypotension, unspecified   Acute respiratory failure   Acute diastolic CHF (congestive heart failure)   Radiation pneumonitis   Amiodarone pulmonary toxicity   HCAP (healthcare-associated pneumonia)   Severe protein-calorie malnutrition   Benign prostatic hypertrophy  Discharge Condition: Medically stable for discharge to skilled nursing facility today  Diet recommendation: As tolerated  History of present illness:  78 year old male with a PMH of 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 difficile colitis requiring PO vancomycin, atrial fibrillation on chronic Coumadin, 3-vessel CAD with chronic occlusion of RCA, severe stenosis in proximal LCx & moderately severe disease in  mid LAD, S/P PTCA, on ASA and Brilinta (Plavix nonresponder), recent admission 07/24/12-08/02/12 for treatment of C. Diff who was sent to the hospital from radiation for evaluation of generalized weakness, vomiting and diarrhea. Patient was receiving stereotactic body radiotherapy, his 4th treatment and was noticed to be very weak and dizzy upon standing up. On admission, he was was noted to be in atrial fibrillation with heart rates in 150s. He also had significant leukocytosis of 32.7. He is currently being treated for recurrent Clostridium difficile infection as well as right lower extremity cellulitis. After initially showing some improvement in his clinical status, his condition deteriorated on 09/07/12 requiring transfer back to the ICU for acute respiratory failure. Patient is a stable in regards to respiratory status. He does still have slight tachycardia which is now per cardiology his new baseline.  Assessment/Plan:   Principal Problem:  *Severe sepsis in the setting of Recurrent C. Difficile colitis and yeast UTI  C. difficile PCR positive. Continue Vanco and Flagyl by mouth until 09/16/2012 WBC count slightly up which is probably due to steroids. Procalcitonin only mildly elevated at 0.24  Urine culture from 4/2 growing yeast. Continue fluconazole for 7 days. Active Problems:  Tachycardia/bradycardia  Per cardiology patient may continue taking current medications, metoprolol and digoxin. His tachycardia may be new baseline for him. Metoprolol 25 mg by mouth twice a day and digoxin 0.125 mg daily Acute hypoxic respiratory failure  Secondary to acute diastolic CHF and volume overload / HCAP and radiation pneumonitis  BNP trended down from 25,958 to 12,670 to 12,074. Fluid balance is negative 2.5 L in past 24 hours.  Respiratory status is stable. Oxygen saturation ranges 97 -  100% in past 24 hours.  CXR repeated 09/14/2012 with similar findings to prior studies with lower lobe pneumonia and left  upper lobe lung mass. Patient will continue Levaquin for 7 days on discharge.  Per pulmonary continue prednisone from 40 mg a day, taper by 5 mg a day to 0 mg. Supratherapeutic INR  Vitamin K 0.5 mg given once 09/04/2012.  Coumadin 2 mg a day, recheck INR to keep it 2-3 Metabolic acidosis  Secondary to bicarbonate losses from GI losses  Resolved, bicarb now 25 Right lower extremity cellulitis  Improved. Was on vancomycin but this is now discontinued.  Atrial fibrillation with RVR  Initially treated with Cardizem drip and metoprolol. Cardizem drip discontinued secondary to low blood pressure and given digoxin on 09/02/2012. Was on amiodarone drip which is now discontinued due to toxicity.  Currently on digoxin 0.125 mg PO daily  Continue metoprolol 25 mg PO BID  Coumadin 2 mg daily Leukocytosis  Secondary to HCAP, C.diff colitis, steroids  H/O Hypertension / Hypotension  Continue metoprolol 25 mg by mouth twice a day Hypercholesterolemia  Statin intolerant  Diabetes, type II and with complications / hypoglycemia  Hemoglobin A1c 6.7%.  Metformin to be held due to kidney failure  Levemir 9 units at bedtime Continue sliding scale Squamous cell lung cancer left upper lobe  Completed stereotactic body radiation 09/07/12.  Not a candidate for lung resection due to multiple medical comorbidities COPD (chronic obstructive pulmonary disease)  Continue Symbicort. Continue prednisone as above CAD (coronary artery disease), native coronary artery  Off of Brilinta per cardiology recommendations.  Continue aspirin daily. Anemia of chronic disease  Secondary to history multiple medical problems including squamous cell lung cancer.  No signs of active bleed. Hemoglobin stable with no indication for transfusion.  Hemoglobin range 7.4 - 9.4 Benign prostatic hypertrophy  Continue Finasteride and Flomax due to low BP Severe protein calorie malnutrition  Secondary to multiple medical  comorbidities   Code Status: Full  Family Communication: Family not at bedside..   Medical Consultants:  Dr. Olga Millers, Cardiology.  Dr. Lonia Farber, PCCM Other Consultants:  Physical therapy: Skilled nursing home Active Antibiotics:  Vancomycin PO 09/02/2012---> C. diff  Flagyl 09/02/2012----> C.diff  Fluconazole 09/09/12 --> Yeast UTI  Levaquin 09/07/12---> Pneumonia Discontinued antibiotics  IV Vancomycin 09/03/12---> 4/414  Cefepime 09/07/12---> 09/10/12   [name], MD  [facility]  Pager [phone]    Discharge Exam: Filed Vitals:   09/14/12 0547  BP: 131/88  Pulse: 101  Temp: 97.6 F (36.4 C)  Resp: 12   Filed Vitals:   09/13/12 1616 09/13/12 2048 09/13/12 2153 09/14/12 0547  BP: 130/75 107/74  131/88  Pulse: 78 121  101  Temp: 97.8 F (36.6 C) 97.7 F (36.5 C)  97.6 F (36.4 C)  TempSrc: Oral Oral  Axillary  Resp: 20 16  12   Height:      Weight:    85.5 kg (188 lb 7.9 oz)  SpO2: 97% 98% 98% 100%    General: Pt is alert, follows commands appropriately, not in acute distress Cardiovascular: Irregular rhythm, tachycardic, S1/S2 appreciated Respiratory: Clear to auscultation bilaterally, no wheezing, no crackles, no rhonchi Abdominal: Soft, non tender, non distended, bowel sounds +, no guarding Extremities: LE edema, no cyanosis, pulses palpable bilaterally DP and PT Neuro: Grossly nonfocal  Discharge Instructions  Discharge Orders   Future Appointments Provider Department Dept Phone   09/21/2012 9:15 AM Wendall Stade, MD [facility] Main Office [place]) [phone]   10/05/2012  3:00 PM Kalman Shan, MD [facility] [phone]   10/11/2012 1:00 PM [name], MD [facility] [phone]   10/14/2012 4:30 PM Delight Ovens, MD [facility] [phone]   11/03/2012 1:00 PM [facility] [phone]   11/03/2012 2:00 PM  Kalman Shan, MD [facility] [phone]   Future Orders Complete By Expires     Call MD for:  difficulty breathing, headache or visual disturbances  As directed     Call MD for:  persistant dizziness or light-headedness  As directed     Call MD for:  persistant nausea and vomiting  As directed     Call MD for:  severe uncontrolled pain  As directed     Diet - low sodium heart healthy  As directed     Increase activity slowly  As directed         Medication List    STOP taking these medications       diltiazem 180 MG 24 hr capsule  Commonly known as:  CARDIZEM CD     metFORMIN 500 MG 24 hr tablet  Commonly known as:  GLUCOPHAGE-XR     Ticagrelor 90 MG Tabs tablet  Commonly known as:  BRILINTA      TAKE these medications       albuterol (5 MG/ML) 0.5% nebulizer solution  Commonly known as:  PROVENTIL  Take 0.5 mLs (2.5 mg total) by nebulization every 2 (two) hours as needed for wheezing or shortness of breath.     aspirin EC 81 MG tablet  Take 81 mg by mouth every other day.     budesonide-formoterol 160-4.5 MCG/ACT inhaler  Commonly known as:  SYMBICORT  Inhale 2 puffs into the lungs 2 (two) times daily.     digoxin 0.125 MG tablet  Commonly known as:  LANOXIN  Take 1 tablet (0.125 mg total) by mouth daily.     feeding supplement Liqd  Take 237 mLs by mouth 2 (two) times daily between meals.     finasteride 5 MG tablet  Commonly known as:  PROSCAR  Take 5 mg by mouth every morning.     fluconazole 100 MG tablet  Commonly known as:  DIFLUCAN  Take 1 tablet (100 mg total) by mouth daily.     insulin detemir 100 UNIT/ML injection  Commonly known as:  LEVEMIR  Inject 0.1 mLs (10 Units total) into the skin at bedtime.     levofloxacin 500 MG tablet  Commonly known as:  LEVAQUIN  Take 1 tablet (500 mg total) by mouth daily.     loperamide 2 MG tablet  Commonly known as:  IMODIUM A-D  Take 2 mg by mouth 4 (four) times daily as needed for  diarrhea or loose stools.     metoprolol tartrate 25 MG tablet  Commonly known as:  LOPRESSOR  Take 1 tablet (25 mg total) by mouth 2 (two) times daily.     metroNIDAZOLE 500 MG tablet  Commonly known as:  FLAGYL  Take 1 tablet (500 mg total) by mouth every 8 (eight) hours.     multivitamin with minerals Tabs  Take 1 tablet by mouth daily.     predniSONE 10 MG tablet  Commonly known as:  DELTASONE  Take 4 tablets (40 mg total) by mouth daily with breakfast.     tamsulosin 0.4 MG Caps  Commonly known as:  FLOMAX  Take 0.4  mg by mouth every morning.     vancomycin 50 mg/mL oral solution  Commonly known as:  VANCOCIN  Take 2.5 mLs (125 mg total) by mouth every 6 (six) hours.     warfarin 2 MG tablet  Commonly known as:  COUMADIN  Take 1 tablet (2 mg total) by mouth daily at 6 PM.           Follow-up Information   Follow up with [facility], MD On 10/05/2012. (3 PM)    Contact information:   [address] [place] [address] [phone]        The results of significant diagnostics from this hospitalization (including imaging, microbiology, ancillary and laboratory) are listed below for reference.    Significant Diagnostic Studies: Dg Abd 1 View  09/04/2012  *RADIOLOGY REPORT*  Clinical Data: Abdominal distention, pain.  ABDOMEN - 1 VIEW  Comparison: 07/04/2012  Findings: Study is limited by patient motion.  Gas within mildly prominent right and transverse colon.  No evidence of bowel obstruction.  No free air or organomegaly.  Bilateral lung opacities are noted as seen on chest x-ray.  IMPRESSION: No evidence of bowel obstruction.  Gas in mildly prominent right and transverse colon.   Original Report Authenticated By: Charlett Nose, M.D.    Dg Chest Port 1 View  09/14/2012  *RADIOLOGY REPORT*  Clinical Data: Lung cancer.  Follow up of "infiltrate".  PORTABLE CHEST - 1 VIEW  Comparison: 09/10/2012  Findings: Midline trachea.  Cardiomegaly accentuated by AP portable  technique.  Similar small pleural effusion. No pneumothorax.  No significant change in the left greater than right lower lobe predominant airspace disease.  Left upper lobe and mass is partially obscured by underlying airspace disease.  IMPRESSION: No significant change since the prior exam.  Diffuse interstitial and airspace opacities, lower lobe predominant.  Considerations include infection and/or pulmonary edema.  Similar small left pleural effusion.  Left upper lobe lung mass.   Original Report Authenticated By: Jeronimo Greaves, M.D.    Dg Chest Port 1 View  09/10/2012  *RADIOLOGY REPORT*  Clinical Data: History of lung carcinoma, follow-up of pulmonary infiltrates  PORTABLE CHEST - 1 VIEW  Comparison: Portable chest x-ray of 09/09/2012  Findings: Diffuse airspace disease with some interstitial component as well.  Cardiomegaly is stable and there may be small effusions present.  IMPRESSION: No significant change in diffuse airspace disease and suboptimal aeration.   Original Report Authenticated By: Dwyane Dee, M.D.    Dg Chest Port 1 View  09/09/2012  *RADIOLOGY REPORT*  Clinical Data: Airspace disease.  PORTABLE CHEST - 1 VIEW  Comparison: 09/07/2012  Findings: Diffuse parenchymal airspace disease in the lungs suggesting edema, any RDS, or pneumonia.  The heart size appears enlarged although the parenchymal process obscures the heart borders.  Shallow inspiration.  No definite pneumothorax or effusion.  Aeration of the lungs appears similar to previous study, allowing for technical differences.  IMPRESSION: Diffuse bilateral airspace disease again demonstrated in the lungs.   Original Report Authenticated By: Burman Nieves, M.D.    Dg Chest Port 1 View  09/07/2012  *RADIOLOGY REPORT*  Clinical Data: Worsening dyspnea  PORTABLE CHEST - 1 VIEW  Comparison: 09/04/2012  Findings: Cardiomediastinal silhouette is stable.  Again noted bilateral diffuse airspace disease which may be due to bilateral pulmonary  edema or bilateral pneumonia.  Small left pleural effusion.  IMPRESSION:  Again noted bilateral diffuse airspace disease which may be due to bilateral pulmonary edema or bilateral pneumonia.  Small left pleural effusion.   Original Report Authenticated By: Natasha Mead, M.D.    Dg Chest Port 1 View  09/04/2012  *RADIOLOGY REPORT*  Clinical Data: Shortness of breath, lung cancer.  PORTABLE CHEST - 1 VIEW  Comparison: 09/03/2012  Findings: Bilateral airspace opacities are again noted and have worsened since prior study.  Left upper lobe mass again noted. Cardiomegaly.  Small bilateral pleural effusions. No acute bony abnormality.  IMPRESSION: Worsening bilateral airspace opacities, edema versus infection.  Stable left upper lobe mass.  Small bilateral effusions.   Original Report Authenticated By: Charlett Nose, M.D.    Dg Chest Port 1 View  09/03/2012  *RADIOLOGY REPORT*  Clinical Data: Pulmonary edema.  History of lung cancer.  PORTABLE CHEST - 1 VIEW  Comparison: PA and lateral chest 07/24/2012.  Findings: Left upper lobe mass is again seen measuring approximately 4.4 cm cranial-caudal, not markedly changed.  The lungs are emphysematous.  Bilateral alveolar and interstitial opacities have worsened.  There is cardiomegaly.  No pneumothorax or pleural fluid.  IMPRESSION:  1.  Bilateral alveolar and interstitial opacities superimposed on chronic change are likely due to pulmonary edema in this patient with cardiomegaly. Multi focal infection or interstitial tumor spread are felt less likely. 2.  Left upper lobe mass.   Original Report Authenticated By: Holley Dexter, M.D.     Microbiology: URINE CULTURE     Status: None   Collection Time    09/07/12  4:26 PM      Result Value Range Status   Specimen Description URINE, CLEAN CATCH   Final   Special Requests NONE   Final   Culture  Setup Time 09/07/2012 21:17   Final   Colony Count >=100,000 COLONIES/ML   Final   Culture YEAST   Final   Report Status  09/08/2012 FINAL   Final  MRSA PCR SCREENING     Status: None   Collection Time    09/07/12  6:46 PM      Result Value Range Status   MRSA by PCR NEGATIVE  NEGATIVE Final     Labs: Basic Metabolic Panel:  Recent Labs Lab 09/09/12 0340 09/10/12 0105 09/11/12 0400 09/12/12 0348 09/13/12 0340 09/14/12 0440  NA 131* 130* 136 129* 132* 134*  K 4.3 4.4 4.6 5.1 5.4* 5.4*  CL 99 99 106 100 102 103  CO2 25 24 23 24 24 25   GLUCOSE 258* 184* 63* 296* 348* 98  BUN 54* 57* 53* 53* 46* 44*  CREATININE 1.19 1.12 1.13 1.21 1.12 1.13  CALCIUM 7.6* 7.5* 7.7* 7.7* 7.6* 7.8*  MG 1.8  --   --   --   --   --   PHOS 2.7  --   --   --   --   --    Liver Function Tests:  Recent Labs Lab 09/11/12 0400  AST 43*  ALT 28  ALKPHOS 72  BILITOT 0.2*  PROT 4.2*  ALBUMIN 1.7*   No results found for this basename: LIPASE, AMYLASE,  in the last 168 hours No results found for this basename: AMMONIA,  in the last 168 hours CBC:  Recent Labs Lab 09/10/12 0105 09/11/12 0400 09/12/12 0348 09/13/12 0340 09/14/12 0440  WBC 9.2 10.2 11.9* 12.2* 13.5*  HGB 7.6* 7.6* 8.7* 9.1* 9.4*  HCT 22.5* 22.7* 26.1* 27.8* 28.4*  MCV 78.1 79.6 79.6 79.9 78.7  PLT 230 195 237 206 202   Cardiac Enzymes: No results found for this basename: CKTOTAL, CKMB,  CKMBINDEX, TROPONINI,  in the last 168 hours BNP: BNP (last 3 results)  Recent Labs  09/07/12 0500 09/08/12 0335 09/11/12 0400  PROBNP 15589.0* 12670.0* 12704.0*   CBG:  Recent Labs Lab 09/13/12 0748 09/13/12 1133 09/13/12 1737 09/13/12 2111 09/14/12 0747  GLUCAP 372* 369* 303* 266* 83    Time coordinating discharge: Over 30 minutes  Signed:  [name], MD  [facility]  09/14/2012, 10:38 AM  Pager #: [phone]

## 2012-09-14 NOTE — Progress Notes (Signed)
Occupational Therapy Treatment Patient Details Name: Brandon Clay MRN: [id number] DOB: August 29, 1934 Today's Date: 09/14/2012 Time: 7829-5621 OT Time Calculation (min): 23 min  OT Assessment / Plan / Recommendation Comments on Treatment Session  Pt agreeable to SNF    Follow Up Recommendations  SNF       Equipment Recommendations  None recommended by OT       Frequency Min 2X/week   Plan Discharge plan needs to be updated    Precautions / Restrictions Precautions Precautions: Fall Restrictions Weight Bearing Restrictions: No       ADL  Grooming: Performed;Moderate assistance Where Assessed - Grooming: Supported standing;Supported sitting;Other (comment) (finished in sitting after standing) Toilet Transfer: Performed;Other (comment);Moderate assistance (sit to stand from bed) Toilet Transfer Method: Sit to stand Toileting - Clothing Manipulation and Hygiene: Performed Where Assessed - Toileting Clothing Manipulation and Hygiene: Standing Transfers/Ambulation Related to ADLs: Pt very weak and will need ST SNF. Pt agreeable and knows he needs rehab prior to going home.       OT Goals ADL Goals ADL Goal: Grooming - Progress: Progressing toward goals ADL Goal: Toilet Transfer - Progress: Progressing toward goals  Visit Information  Last OT Received On: 09/14/12    Subjective Data  Subjective: I do want to get out of this bed- but i am weak. Being in bed for this many days has made me weak      Cognition  Cognition Overall Cognitive Status: Appears within functional limits for tasks assessed/performed Arousal/Alertness: Awake/alert Orientation Level: Appears intact for tasks assessed Behavior During Session: [facility] for tasks performed    Mobility  Bed Mobility Bed Mobility: Supine to Sit Supine to Sit: 3: Mod assist;With rails;HOB elevated Transfers Transfers: Stand to Sit;Sit to Stand Sit to Stand: 3: Mod assist;From bed;With upper extremity assist;With  armrests Stand to Sit: To chair/3-in-1;3: Mod assist;With armrests Details for Transfer Assistance: Verbal cues for pt not to lean on therapist and stand up straight for transfer          End of Session OT - End of Session Activity Tolerance: Patient tolerated treatment well Patient left: in chair;with call bell/phone within reach       [facility], Karin Golden D 09/14/2012, 9:46 AM

## 2012-09-14 NOTE — Progress Notes (Signed)
PULMONARY  / CRITICAL CARE MEDICINE  Name: Brandon Clay MRN: [id number] DOB: Jun 28, 1934    ADMISSION DATE:  09/02/2012 CONSULTATION DATE:  09/07/12  REFERRING MD :  [facility] PRIMARY SERVICE: TRH  CHIEF COMPLAINT:  dyspnea  BRIEF PATIENT DESCRIPTION:  78 yo with COPD, recently diagnosed SCLCA currently undergoing SBRT, AF recently treated with Amiodarone and CAD with ischemic cardiomyopathy who developed progressive pulmonary infiltrates, hypoxemia and respiratory distress requiring transfer to ICU. Recent C-Diff Colitis on oral Vanco. PCCM was asked to evaluate.    SIGNIFICANT EVENTS / STUDIES:  3/27 - Admitted from radiation therapy (#4/5) with weakness/dizzy, & hypoxemia / resp distress 4/01 - Transferred to ICU with hypoxia / respiratory distress  4/02 - Afib with CVR, bp soft 4/03 - soft BP, Afib with increasing rate  LINES / TUBES: PIV  CULTURES: 3/28 C. Dif PCR >>> POS  4/1 Urine >>>large leukocytes, 21-50 WBC, neg nitrate 4/1 UC>>>100k yeast 4/1 MRSA PCR >>> neg   ANTIBIOTICS: Flagyl 3/27 >>>  Vancomycin PO 3/27 >>>  Cefepime 4/1 >>> 4-4 Levaquin 4/1 >>>  Vancomycin IV 4/1 >>>4-4 Diflucan 4/3>>>  SUBJECTIVE: Denies CP Dyspnea improved oob to chair Had urinary retention overnight  VITAL SIGNS: Temp:  [97.6 F (36.4 C)-97.8 F (36.6 C)] 97.6 F (36.4 C) (04/08 0547) Pulse Rate:  [78-121] 101 (04/08 0547) Resp:  [12-21] 12 (04/08 0547) BP: (99-131)/(61-88) 131/88 mmHg (04/08 0547) SpO2:  [97 %-100 %] 100 % (04/08 0547) Weight:  [85.5 kg (188 lb 7.9 oz)] 85.5 kg (188 lb 7.9 oz) (04/08 0547)      INTAKE / OUTPUT: Intake/Output     04/07 0701 - 04/08 0700 04/08 0701 - 04/09 0700   P.O. 360    I.V. (mL/kg) 220 (2.6)    IV Piggyback 150    Total Intake(mL/kg) 730 (8.5)    Urine (mL/kg/hr) 2950 (1.4) 400 (1.6)   Total Output 2950 400   Net -2220 -400        Stool Occurrence 1 x      PHYSICAL EXAMINATION: General:  Sitting in chair looks better but  still deconditioned Neuro:  awake HEENT:  Dry mucus membranes Cardiovascular:  RRR Lungs:  resp's even/non-labored, soft crackles lt base Abdomen:  Soft nt Musculoskeletal:  From, no acute deformities Skin:  Clear, no rashes / lesions, no edema  LABS:  Recent Labs Lab 09/07/12 1520  09/08/12 0335 09/09/12 0340  09/11/12 0400 09/12/12 0348 09/13/12 0340 09/14/12 0440  HGB  --   < > 7.7* 7.4*  < > 7.6* 8.7* 9.1* 9.4*  WBC  --   < > 8.4 6.8  < > 10.2 11.9* 12.2* 13.5*  PLT  --   < > 229 222  < > 195 237 206 202  NA  --   < > 132* 131*  < > 136 129* 132* 134*  K  --   < > 4.0 4.3  < > 4.6 5.1 5.4* 5.4*  CL  --   < > 101 99  < > 106 100 102 103  CO2  --   < > 26 25  < > 23 24 24 25   GLUCOSE  --   < > 110* 258*  < > 63* 296* 348* 98  BUN  --   < > 43* 54*  < > 53* 53* 46* 44*  CREATININE  --   < > 1.19 1.19  < > 1.13 1.21 1.12 1.13  CALCIUM  --   < >  7.5* 7.6*  < > 7.7* 7.7* 7.6* 7.8*  MG  --   --   --  1.8  --   --   --   --   --   PHOS  --   --   --  2.7  --   --   --   --   --   AST  --   --   --   --   --  43*  --   --   --   ALT  --   --   --   --   --  28  --   --   --   ALKPHOS  --   --   --   --   --  72  --   --   --   BILITOT  --   --   --   --   --  0.2*  --   --   --   PROT  --   --   --   --   --  4.2*  --   --   --   ALBUMIN  --   --   --   --   --  1.7*  --   --   --   APTT  --   --   --  35  --   --   --   --   --   INR  --   < > 2.24* 2.51*  < > 2.42* 2.05* 1.76* 1.87*  PROCALCITON  --   < > 1.21 0.65  --  0.24  --  0.13  --   PROBNP  --   --  12670.0*  --   --  12704.0*  --   --   --   PHART 7.517*  --   --   --   --   --   --   --   --   PCO2ART 29.1*  --   --   --   --   --   --   --   --   PO2ART 51.0*  --   --   --   --   --   --   --   --   < > = values in this interval not displayed.  Recent Labs Lab 09/13/12 0748 09/13/12 1133 09/13/12 1737 09/13/12 2111 09/14/12 0747  GLUCAP 372* 369* 303* 266* 83    Imaging: Dg Chest Port 1  View  09/14/2012  *RADIOLOGY REPORT*  Clinical Data: Lung cancer.  Follow up of "infiltrate".  PORTABLE CHEST - 1 VIEW  Comparison: 09/10/2012  Findings: Midline trachea.  Cardiomegaly accentuated by AP portable technique.  Similar small pleural effusion. No pneumothorax.  No significant change in the left greater than right lower lobe predominant airspace disease.  Left upper lobe and mass is partially obscured by underlying airspace disease.  IMPRESSION: No significant change since the prior exam.  Diffuse interstitial and airspace opacities, lower lobe predominant.  Considerations include infection and/or pulmonary edema.  Similar small left pleural effusion.  Left upper lobe lung mass.   Original Report Authenticated By: Jeronimo Greaves, M.D.     ASSESSMENT / PLAN:   PULMONARY A: Acute hypoxemic resp failure with bilat infiltrates, AECOPD - Poss XRT pneumonitis, lung CA, HCap?, amiod toxic? Home O2 prior to admit P:   Taper Abx >> d/c vancomycin 4/04, cefepime 4/04 Can dc levaquin  4/8 Change to prednisone 50 mg -can dc on 40 mg & taper slow over next 2-3 weeks F/u CXR in 3-4 wks on fu appt Negative fluid balance as tolerated BDs  CARDIOVASCULAR Lab Results  Component Value Date   INR 1.87* 09/14/2012   INR 1.76* 09/13/2012   INR 2.05* 09/12/2012     A:  Ischemic CM Mild Hypotension Afib - chronic, on coumadin, RVR 4/3  P:  Per cardiology Coumadin per pharmacy Amiodarone stopped  Infectious:  See flow for tx for c diff , uti and pulmonary per IM.   Global: Pulmonary status unchanged, requires O2 and desaturates with activity. Very little for PCCM to offer. He can follow up with Dr. Marchelle Gearing as outpt. consider rehab PCCM to sign off   Rose Hegner V.  09/14/2012, 9:57 AM

## 2012-09-14 NOTE — Progress Notes (Signed)
Inpatient Diabetes Program Recommendations  AACE/ADA: New Consensus Statement on Inpatient Glycemic Control (2013)  Target Ranges:  Prepandial:   less than 140 mg/dL      Peak postprandial:   less than 180 mg/dL (1-2 hours)      Critically ill patients:  140 - 180 mg/dL   Reason for Visit: Hyperglycemia  Results for Brandon Clay, Brandon Clay (MRN [id number]) as of 09/14/2012 10:17  Ref. Range 09/12/2012 12:44 09/12/2012 16:56 09/12/2012 21:39 09/13/2012 07:48 09/13/2012 11:33 09/13/2012 17:37 09/13/2012 21:11 09/14/2012 07:47  Glucose-Capillary Latest Range: 70-99 mg/dL 086 (H) 578 (H) 469 (H) 372 (H) 369 (H) 303 (H) 266 (H) 83    Inpatient Diabetes Program Recommendations Insulin - Basal: Add Levemir 9 units QHS (1/2 of home dose)    Note: Will continue to follow.  Thank you. Ailene Ards, RD, LDN, CDE Inpatient Diabetes Coordinator [phone]

## 2012-09-15 LAB — BASIC METABOLIC PANEL
Calcium: 7.7 mg/dL — ABNORMAL LOW (ref 8.4–10.5)
GFR calc Af Amer: 80 mL/min — ABNORMAL LOW (ref >90)
GFR calc non Af Amer: 69 mL/min — ABNORMAL LOW (ref >90)
Sodium: 135 mEq/L (ref 135–145)

## 2012-09-15 LAB — CBC
MCH: 26.5 pg (ref 26.0–34.0)
MCHC: 33 g/dL (ref 30.0–36.0)
Platelets: 170 10*3/uL (ref 150–400)
RBC: 3.36 MIL/uL — ABNORMAL LOW (ref 4.22–5.81)

## 2012-09-15 LAB — GLUCOSE, CAPILLARY: Glucose-Capillary: 207 mg/dL — ABNORMAL HIGH (ref 70–99)

## 2012-09-15 LAB — PROTIME-INR
INR: 1.88 — ABNORMAL HIGH (ref 0.00–1.49)
Prothrombin Time: 20.9 seconds — ABNORMAL HIGH (ref 11.6–15.2)

## 2012-09-15 MED ORDER — WARFARIN SODIUM 2.5 MG PO TABS
2.5000 mg | ORAL_TABLET | Freq: Once | ORAL | Status: AC
  Administered 2012-09-15: 2.5 mg via ORAL
  Filled 2012-09-15: qty 1

## 2012-09-15 MED ORDER — LIDOCAINE VISCOUS 2 % MT SOLN
20.0000 mL | OROMUCOSAL | Status: DC | PRN
  Administered 2012-09-16 (×2): 20 mL via OROMUCOSAL
  Filled 2012-09-15: qty 20

## 2012-09-15 MED ORDER — INSULIN ASPART 100 UNIT/ML ~~LOC~~ SOLN
6.0000 [IU] | Freq: Once | SUBCUTANEOUS | Status: AC
  Administered 2012-09-15: 6 [IU] via SUBCUTANEOUS

## 2012-09-15 MED ORDER — SODIUM CHLORIDE 0.9 % IV SOLN
Freq: Once | INTRAVENOUS | Status: AC
  Administered 2012-09-15: 08:00:00 via INTRAVENOUS

## 2012-09-15 NOTE — Progress Notes (Signed)
[facility] PROGRESS NOTE  Brandon Clay ZOX:[id number] DOB: Jun 15, 1934 DOA: 09/02/2012 PCP: [name], MD  Brief narrative: Brandon Clay is an 78 y.o. 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 difficile colitis requiring PO vancomycin, atrial fibrillation on chronic Coumadin, 3 vessel CAD with chronic occlusion of RCA, severe stenosis in proximal LCx & moderately severe disease in mid LAD, S/P PTCA, on ASA and Brilinta (Plavix nonresponder), recent admission 07/24/12-08/02/12 for treatment of C. Diff who was sent to the hospital from radiation for evaluation of generalized weakness, vomiting and diarrhea. Patient was receiving stereotactic body radiotherapy (#4 / 5 planned treatments) and was noticed to be very weak and dizzy upon standing up.  On admission, he was was noted to be in atrial fibrillation with heart rates in 150s. He also had significant leukocytosis of 32.7. He is currently being treated for recurrent Clostridium difficile infection as well as right lower extremity cellulitis.  After initially showing some improvement in his clinical status, his condition deteriorated on 09/07/12 requiring transfer back to the ICU for acute respiratory failure.  PCCM was consulting and is assisting with management. The patient's clinical status has improved with empiric antibiotics and steroids.  Assessment/Plan: Principal Problem:  Generalized weakness in the setting of nausea, vomiting, diarrhea, dehydration / Recurrent Clostridium difficile colitis -Likely from recurrent C. difficile colitis.  C. difficile PCR positive. N/V resolved. Diarrhea improved.  WBC normalized.   -Remains on oral vancomycin and Flagyl.   -Replace electrolytes as needed.  -Continue antiemetics as needed. -Status post physical and occupational therapy evaluations: SNF versus 24 hour supervision recommended. Active Problems: Acute respiratory failure with dyspnea /  hypoxia secondary to acute diastolic CHF and volume overload / R/O HCAP and radiation pneumonitis -Diuresed. -Pro BNP trending down 25,958 09/05/12--->12,670 09/08/12. -Chest x-ray done 09/07/2012: Persistent opacities worrisome for HCAP.  Empiric Levaquin and cefepime (already on vancomycin) started 09/08/2012. Now on Levaquin monotherapy. -Empiric steroids started by PCCM for radiation pneumonitis. Prednisone now on taper. Supratherapeutic INR -Given 0.5 mg of vitamin K 09/04/2012. -Coumadin dosing per pharmacy. Metabolic acidosis -Likely from bicarbonate losses in the stool. Corrected.   Right lower extremity cellulitis -Small fluctuant area noted RLE. Resolved on vancomycin. Atrial fibrillation with RVR  -Known history of atrial fibrillation, on Coumadin. Goal INR 2-3. -Initially treated with Cardizem drip and metoprolol. Cardizem drip discontinued secondary to low blood pressure and given digoxin overnight on 09/02/2012.  Now off Amiodarone drip. -Heart rate improved 90s-low 100s.  Avoid further amiodarone per PCCM recommendations.  Use digoxin and IV Cardizem, if needed per cardiology recs. Leukocytosis  -WBC normalized 09/08/12. H/O Hypertension / Hypotension  -Blood pressure currently well-controlled (and on low side). Hypercholesterolemia  -History of statin intolerance. 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 CBGs [id number].  -Metformin currently on hold.  Continue current insulin therapy. Squamous cell lung cancer left upper lobe  -Completed stereotactic body radiation 09/07/12. -Not a candidate for lung resection.   COPD (chronic obstructive pulmonary disease)  -Continue Symbicort.  No wheeze.  Empiric steroids started by PCCM on 09/07/12. CAD (coronary artery disease), native coronary artery   -On outpatient Brilinta and aspirin.  -Now off Brilinta per cardiology recommendations. Anemia of chronic disease  -Secondary to history multiple medical  problems including squamous cell lung cancer.  -No signs of active bleed. Hemoglobin stable with no indication for transfusion. Benign prostatic hypertrophy  -Continue Finasteride and Flomax.  Code Status: Full  Family Communication: Wife and daughter updated at bedside 09/08/12. Disposition Plan: Home versus SNF depending on progress when stable.   Medical Consultants:  Dr. Olga Millers, Cardiology.  Dr. Lonia Farber, PCCM  Other Consultants:  Physical therapy: Skilled nursing home placement versus home with 24-hour supervision.  Anti-infectives:  Vancomycin 09/02/2012--->  Flagyl 09/02/2012---->  IV Vancomycin 09/03/12---> 09/10/2012  Cefepime 09/07/12---> 09/10/2012  Levaquin 09/07/12--->  HPI/Subjective: Brandon Clay is awake and alert. He looks better than when I saw him last, approximately 5 days ago. Dyspnea has improved. No increased work of breathing. No cough. No nausea or vomiting. Appetite remains fair.  Objective: Filed Vitals:   09/15/12 0744 09/15/12 0836 09/15/12 0903 09/15/12 1422  BP: 87/63 100/58  94/58  Pulse:    75  Temp:    98.3 F (36.8 C)  TempSrc:    Oral  Resp:    16  Height:      Weight:      SpO2:   99% 100%    Intake/Output Summary (Last 24 hours) at 09/15/12 1827 Last data filed at 09/15/12 1803  Gross per 24 hour  Intake 951.66 ml  Output   1125 ml  Net -173.34 ml    Exam: Gen:  NAD, non-toxic appearing Cardiovascular:  HSIR, tachy Respiratory:  Lungs clear to auscultation bilaterally Gastrointestinal:  Abdomen soft, NT/ND, + BS Extremities:  1+ edema  Data Reviewed: Basic Metabolic Panel:  Recent Labs Lab 09/09/12 0340  09/11/12 0400 09/12/12 0348 09/13/12 0340 09/14/12 0440 09/15/12 0430  NA 131*  < > 136 129* 132* 134* 135  K 4.3  < > 4.6 5.1 5.4* 5.4* 5.0  CL 99  < > 106 100 102 103 104  CO2 25  < > 23 24 24 25 25   GLUCOSE 258*  < > 63* 296* 348* 98 93  BUN 54*  < > 53* 53* 46* 44* 34*  CREATININE  1.19  < > 1.13 1.21 1.12 1.13 1.03  CALCIUM 7.6*  < > 7.7* 7.7* 7.6* 7.8* 7.7*  MG 1.8  --   --   --   --   --   --   PHOS 2.7  --   --   --   --   --   --   < > = values in this interval not displayed. GFR Estimated Creatinine Clearance: 64 ml/min (by C-G formula based on Cr of 1.03). Liver Function Tests:  Recent Labs Lab 09/11/12 0400  AST 43*  ALT 28  ALKPHOS 72  BILITOT 0.2*  PROT 4.2*  ALBUMIN 1.7*   Coagulation profile  Recent Labs Lab 09/11/12 0400 09/12/12 0348 09/13/12 0340 09/14/12 0440 09/15/12 0430  INR 2.42* 2.05* 1.76* 1.87* 1.88*    CBC:  Recent Labs Lab 09/11/12 0400 09/12/12 0348 09/13/12 0340 09/14/12 0440 09/15/12 0430  WBC 10.2 11.9* 12.2* 13.5* 9.2  HGB 7.6* 8.7* 9.1* 9.4* 8.9*  HCT 22.7* 26.1* 27.8* 28.4* 27.0*  MCV 79.6 79.6 79.9 78.7 80.4  PLT 195 237 206 202 170   BNP (last 3 results)  Recent Labs  09/07/12 0500 09/08/12 0335 09/11/12 0400  PROBNP 15589.0* 12670.0* 12704.0*   CBG:  Recent Labs Lab 09/14/12 1714 09/14/12 2127 09/15/12 0746 09/15/12 1155 09/15/12 1647  GLUCAP 219* 113* 130* 225* 207*   Microbiology Recent Results (from the past 240 hour(s))  URINE CULTURE     Status: None   Collection Time    09/07/12  4:26  PM      Result Value Range Status   Specimen Description URINE, CLEAN CATCH   Final   Special Requests NONE   Final   Culture  Setup Time 09/07/2012 21:17   Final   Colony Count >=100,000 COLONIES/ML   Final   Culture YEAST   Final   Report Status 09/08/2012 FINAL   Final  MRSA PCR SCREENING     Status: None   Collection Time    09/07/12  6:46 PM      Result Value Range Status   MRSA by PCR NEGATIVE  NEGATIVE Final   Comment:            The GeneXpert MRSA Assay (FDA     approved for NASAL specimens     only), is one component of a     comprehensive MRSA colonization     surveillance program. It is not     intended to diagnose MRSA     infection nor to guide or     monitor treatment  for     MRSA infections.     Procedures and Diagnostic Studies:  Dg Abd 1 View 09/04/2012 IMPRESSION: No evidence of bowel obstruction.  Gas in mildly prominent right and transverse colon.   Original Report Authenticated By: Charlett Nose, M.D.     Dg Chest Port 1 View 09/04/2012 IMPRESSION: Worsening bilateral airspace opacities, edema versus infection.  Stable left upper lobe mass.  Small bilateral effusions.   Original Report Authenticated By: Charlett Nose, M.D.     Dg Chest Port 1 View 09/07/2012  IMPRESSION:  Again noted bilateral diffuse airspace disease which may be due to bilateral pulmonary edema or bilateral pneumonia.  Small left pleural effusion.   Original Report Authenticated By: Natasha Mead, M.D.     Dg Chest Port 1 View 09/09/2012  IMPRESSION: Diffuse bilateral airspace disease again demonstrated in the lungs.   Original Report Authenticated By: Burman Nieves, M.D.     Scheduled Meds: . aspirin EC  81 mg Oral QODAY  . budesonide-formoterol  2 puff Inhalation BID  . digoxin  0.125 mg Oral Daily  . feeding supplement  237 mL Oral BID BM  . fluconazole  100 mg Oral Daily  . insulin aspart  0-20 Units Subcutaneous TID WC  . insulin aspart  0-5 Units Subcutaneous QHS  . levofloxacin  500 mg Oral Daily  . magic mouthwash w/lidocaine  5 mL Oral QID  . metroNIDAZOLE  500 mg Oral Q8H  . multivitamin with minerals  1 tablet Oral Daily  . predniSONE  50 mg Oral Q breakfast  . sodium chloride  3 mL Intravenous Q12H  . vancomycin  125 mg Oral Q6H  . Warfarin - Pharmacist Dosing Inpatient   Does not apply q1800   Continuous Infusions: . sodium chloride 10 mL/hr at 09/14/12 0659    Time spent: 25 minutes.   LOS: 13 days   RAMA,CHRISTINA  [facility] Pager [phone].  If 8PM-8AM, please contact night-coverage at [email or web address], password [facility] 09/15/2012, 6:27 PM

## 2012-09-15 NOTE — Progress Notes (Signed)
CSW spoke with [employer] case worker, Brandon Clay who said that she has been in hospital throughout the day and has been unable to access clinical information that was faxed over due to a firewall. Brandon Clay states that she will give prior authorization in the morning for Mr. Brandon Clay to go to [facility]. Facility & daughter aware. Text page sent to Dr. Darnelle Catalan.   Unice Bailey, LCSW [facility] Clinical Social Worker cell #: [phone]

## 2012-09-15 NOTE — Progress Notes (Signed)
Patient has a private room at [facility] (patient's first choice), patient & daughter, [name] aware. CSW attempting to reach [employer] for insurance authorization - updated clinicals faxed over this morning. Daughter to complete admission paperwork @ SNF this afternoon in anticipation of insurance approval & discharge this afternoon. CSW will continue to try to reach insurance case manager.   Unice Bailey, LCSW [facility] Clinical Social Worker cell #: [phone]

## 2012-09-15 NOTE — Progress Notes (Signed)
Chaplain saw pt on rounds due to length of stay.  Pt asleep and did not wake.  Will continue to follow and attempt to connect with pt for assessment.    Belva Crome MDiv 09/15/2012 1:14 PM

## 2012-09-15 NOTE — Progress Notes (Signed)
ANTICOAGULATION CONSULT NOTE - Follow Up  Pharmacy Consult for Coumadin Indication: atrial fibrillation  No Known Allergies  Labs:  Recent Labs  09/13/12 0340 09/14/12 0440 09/15/12 0430  HGB 9.1* 9.4* 8.9*  HCT 27.8* 28.4* 27.0*  PLT 206 202 170  LABPROT 19.9* 20.8* 20.9*  INR 1.76* 1.87* 1.88*  CREATININE 1.12 1.13 1.03    Estimated Creatinine Clearance: 64 ml/min (by C-G formula based on Cr of 1.03).   Assessment: 78 yo M on chronic Coumadin 4mg /day for h/o atrial fibrillation. Coumadin held prior to admission due to supratherapeutic INR, resumed 3/30.   INR remains subtherapeutic, stable overnight, near goal range. Reduced warfarin dosage requirements likely due to drug interactions (particularly fluconazole and metronidazole but also levofloxacin and prednisone) and limited dietary intake.  CBC improved.   Tolerating diet.  Noted discharge plans yesterday with stop dates for flagyl 4/10, fluconazole, 4/16  Recommendation is to continue warfarin 2mg  daily with repeat check of INR on outpatient basis either Thur/Fri this week.  Will give small increase in dose tonight to try to get INR to goal.     Goal of Therapy:  INR 2-3 Monitor platelets by anticoagulation protocol: Yes   Plan:   Coumadin 2.5mg  PO x 1 today.  Daily PT/INR.  Haynes Hoehn, PharmD 09/15/2012 9:53 AM  Pager: [phone]

## 2012-09-15 NOTE — Progress Notes (Signed)
Physical Therapy Treatment Patient Details Name: Brandon Clay MRN: [id number] DOB: January 21, 1935 Today's Date: 09/15/2012 Time: 8657-8469 PT Time Calculation (min): 27 min  PT Assessment / Plan / Recommendation Comments on Treatment Session  Pt ambulate 3 feet x2 with increased assist and performed exercises in recliner.  Pt remained on 3L O2 [place] throughout session.  Pt reports d/c today to SNF close to home.    Follow Up Recommendations  SNF     Does the patient have the potential to tolerate intense rehabilitation     Barriers to Discharge        Equipment Recommendations  Rolling walker with 5" wheels;Wheelchair (measurements PT);Wheelchair cushion (measurements PT)    Recommendations for Other Services    Frequency     Plan Discharge plan remains appropriate;Frequency remains appropriate    Precautions / Restrictions Precautions Precautions: Fall Precaution Comments: O2 dependent Restrictions Weight Bearing Restrictions: No   Pertinent Vitals/Pain "burning" tongue, RN notified    Mobility  Bed Mobility Bed Mobility: Supine to Sit Supine to Sit: 4: Min assist;HOB elevated;With rails Details for Bed Mobility Assistance: assist for trunk upright, rails also to assist Transfers Transfers: Sit to Stand;Stand to Sit Sit to Stand: 1: +2 Total assist;From bed;From chair/3-in-1;With upper extremity assist Sit to Stand: Patient Percentage: 50% Stand to Sit: 1: +2 Total assist;To chair/3-in-1;With upper extremity assist Stand to Sit: Patient Percentage: 50% Details for Transfer Assistance: verbal cues for safe technique including full hip and trunk extension upon standing, more assist from chair surface vs elevated bed Ambulation/Gait Ambulation/Gait Assistance: 1: +2 Total assist Ambulation/Gait: Patient Percentage: 50% Ambulation Distance (Feet): 3 Feet (x2) Assistive device: Rolling walker Ambulation/Gait Assistance Details: pt only able to tolerate a few steps with  increased verbal cues for technique including RW distance, step length and posture, increased verbal and tactile cues for trunk/hip extension, performed 3 feet x2 with seated rest break inbetween Gait Pattern: Step-to pattern;Trunk flexed Gait velocity: decreased General Gait Details: fatigues quickly    Exercises General Exercises - Lower Extremity Ankle Circles/Pumps: AROM;20 reps;Seated;Both Gluteal Sets: AROM;10 reps;Both Long Arc Quad: AROM;Both;10 reps;Seated Heel Slides: AROM;Both;Supine;10 reps Hip ABduction/ADduction: AROM;Both;10 reps;Supine Hip Flexion/Marching: AROM;Seated;Both;10 reps   PT Diagnosis:    PT Problem List:   PT Treatment Interventions:     PT Goals Acute Rehab PT Goals Pt will go Supine/Side to Sit: with modified independence PT Goal: Supine/Side to Sit - Progress: Updated due to goal met PT Goal: Sit to Stand - Progress: Progressing toward goal Pt will Ambulate: 1 - 15 feet;with min assist;with rolling walker PT Goal: Ambulate - Progress: Progressing toward goal  Visit Information  Last PT Received On: 09/15/12 Assistance Needed: +2    Subjective Data  Subjective: I think that's good.  (performed very short distance ambulation today)   Cognition  Cognition Overall Cognitive Status: Appears within functional limits for tasks assessed/performed Arousal/Alertness: Awake/alert Orientation Level: Appears intact for tasks assessed Behavior During Session: [facility] for tasks performed    Balance     End of Session PT - End of Session Equipment Utilized During Treatment: Oxygen Activity Tolerance: Patient limited by fatigue Patient left: in chair;with call bell/phone within reach Nurse Communication:  ("mouth rinse" per pt request for burning sensation)   GP     Taevin Mcferran,KATHrine E 09/15/2012, 12:24 PM Zenovia Jarred, PT, DPT 09/15/2012 Pager: [phone]

## 2012-09-16 LAB — GLUCOSE, CAPILLARY
Glucose-Capillary: 252 mg/dL — ABNORMAL HIGH (ref 70–99)
Glucose-Capillary: 283 mg/dL — ABNORMAL HIGH (ref 70–99)

## 2012-09-16 LAB — CBC
HCT: 26.3 % — ABNORMAL LOW (ref 39.0–52.0)
Hemoglobin: 8.7 g/dL — ABNORMAL LOW (ref 13.0–17.0)
MCH: 26.6 pg (ref 26.0–34.0)
MCHC: 33.1 g/dL (ref 30.0–36.0)
MCV: 80.4 fL (ref 78.0–100.0)

## 2012-09-16 LAB — BASIC METABOLIC PANEL
BUN: 31 mg/dL — ABNORMAL HIGH (ref 6–23)
Creatinine, Ser: 1.08 mg/dL (ref 0.50–1.35)
GFR calc non Af Amer: 65 mL/min — ABNORMAL LOW (ref >90)
Glucose, Bld: 227 mg/dL — ABNORMAL HIGH (ref 70–99)
Potassium: 4.6 mEq/L (ref 3.5–5.1)

## 2012-09-16 LAB — PROTIME-INR: INR: 1.77 — ABNORMAL HIGH (ref 0.00–1.49)

## 2012-09-16 MED ORDER — WARFARIN SODIUM 4 MG PO TABS
4.0000 mg | ORAL_TABLET | Freq: Once | ORAL | Status: AC
  Administered 2012-09-16: 4 mg via ORAL
  Filled 2012-09-16: qty 1

## 2012-09-16 MED ORDER — LIDOCAINE VISCOUS 2 % MT SOLN: 20.0000 mL | OROMUCOSAL | Status: DC | PRN

## 2012-09-16 NOTE — Progress Notes (Signed)
NUTRITION FOLLOW UP  Intervention:   Continue Multivitamin with minerals daily  Encourage PO intake >75% of meals Discontinue Glucerna BID    Nutrition Dx:   Inadequate oral intake related to decreased appetite as evidenced by pt report of only eating a few bites of food today; discontinue  Goal:   Pt to meet >/= 90% of their estimated nutrition needs; being met   Monitor:   Discontinue monitoring; signing off  Assessment:   Pt reports that he has been eating 100% of most meals and he stopped drinking Glucerna because it was causing diarrhea. Pt has been maintaining wt.   Height: Ht Readings from Last 1 Encounters:  09/07/12 5\' 10"  (1.778 m)    Weight Status:   Wt Readings from Last 1 Encounters:  09/16/12 186 lb 8.2 oz (84.6 kg)    Re-estimated needs:  Kcal: 2175-2370  Protein: 114-128 grams  Fluid: 2.0- 2.3 L  Skin: Intact; ecchymosis on arms  Diet Order: Carb Control   Intake/Output Summary (Last 24 hours) at 09/16/12 1705 Last data filed at 09/16/12 1448  Gross per 24 hour  Intake    520 ml  Output   1100 ml  Net   -580 ml    Last BM: 4/9   Labs:   Recent Labs Lab 09/14/12 0440 09/15/12 0430 09/16/12 0540  NA 134* 135 135  K 5.4* 5.0 4.6  CL 103 104 103  CO2 25 25 24   BUN 44* 34* 31*  CREATININE 1.13 1.03 1.08  CALCIUM 7.8* 7.7* 7.2*  GLUCOSE 98 93 227*    CBG (last 3)   Recent Labs  09/16/12 0006 09/16/12 0732 09/16/12 1146  GLUCAP 283* 188* 252*    Scheduled Meds: . aspirin EC  81 mg Oral QODAY  . budesonide-formoterol  2 puff Inhalation BID  . digoxin  0.125 mg Oral Daily  . feeding supplement  237 mL Oral BID BM  . fluconazole  100 mg Oral Daily  . insulin aspart  0-20 Units Subcutaneous TID WC  . insulin aspart  0-5 Units Subcutaneous QHS  . magic mouthwash w/lidocaine  5 mL Oral QID  . metroNIDAZOLE  500 mg Oral Q8H  . multivitamin with minerals  1 tablet Oral Daily  . predniSONE  50 mg Oral Q breakfast  . sodium  chloride  3 mL Intravenous Q12H  . vancomycin  125 mg Oral Q6H  . warfarin  4 mg Oral ONCE-1800  . Warfarin - Pharmacist Dosing Inpatient   Does not apply q1800    Continuous Infusions: . sodium chloride 10 mL/hr at 09/14/12 0659    Ian Malkin RD, LDN Inpatient Clinical Dietitian Pager: [phone] After Hours Pager: [phone]

## 2012-09-16 NOTE — Progress Notes (Signed)
ANTICOAGULATION CONSULT NOTE - Follow Up  Pharmacy Consult for Coumadin Indication: atrial fibrillation  No Known Allergies  Labs:  Recent Labs  09/14/12 0440 09/15/12 0430 09/16/12 0540  HGB 9.4* 8.9* 8.7*  HCT 28.4* 27.0* 26.3*  PLT 202 170 154  LABPROT 20.8* 20.9* 20.0*  INR 1.87* 1.88* 1.77*  CREATININE 1.13 1.03 1.08    Estimated Creatinine Clearance: 61 ml/min (by C-G formula based on Cr of 1.08).   Assessment: 78 yo M on chronic Coumadin 4mg /day for h/o atrial fibrillation. Coumadin held prior to admission due to supratherapeutic INR, resumed 3/30.   INR = 1.77, remains subtherapeutic, small drop overnight. Pt has been receiving smaller doses of coumadin due to supratherapeutic INR on admission with persistently high levels despite held coumadin and now d/t DDIs with flagyl, fluconazole, and levaquin. Noted stop dates for antibiotics.   CBC stable.    Tolerating diet.  Will give boosted dose of coumadin tonight.    Recommendation for discharge is to continue warfarin 2.5mg  daily with repeat check of INR on Friday or Saturday.  Once antibiotics and antifungals are stopped, pt can be resumed on home dose of coumadin with close follow-up.    Goal of Therapy:  INR 2-3 Monitor platelets by anticoagulation protocol: Yes   Plan:   Coumadin 4mg  PO x 1 today.  Daily PT/INR.  Haynes Hoehn, PharmD 09/16/2012 9:19 AM  Pager: [phone]

## 2012-09-16 NOTE — Progress Notes (Signed)
Inpatient Diabetes Program Recommendations  AACE/ADA: New Consensus Statement on Inpatient Glycemic Control (2013)  Target Ranges:  Prepandial:   less than 140 mg/dL      Peak postprandial:   less than 180 mg/dL (1-2 hours)      Critically ill patients:  140 - 180 mg/dL   Reason for Visit: Hyperglycemia  Results for Brandon, Clay (MRN [id number]) as of 09/16/2012 17:18  Ref. Range 09/16/2012 05:40  Sodium Latest Range: 136-145 mEq/L 135  Potassium Latest Range: 3.5-5.1 mEq/L 4.6  Chloride Latest Range: 96-112 mEq/L 103  CO2 Latest Range: 19-32 mEq/L 24  BUN Latest Range: 7.0-26.0 mg/dL 31 (H)  Creatinine Latest Range: 0.50-1.35 mg/dL 1.47  Calcium Latest Range: 8.4-10.5 mg/dL 7.2 (L)  GFR calc non Af Amer Latest Range: >90 mL/min 65 (L)  GFR calc Af Amer Latest Range: >90 mL/min 76 (L)  Glucose Latest Range: 70-99 mg/dl 829 (H)  Results for Brandon, Clay (MRN [id number]) as of 09/16/2012 17:18  Ref. Range 09/15/2012 21:21 09/15/2012 23:04 09/16/2012 00:06 09/16/2012 07:32 09/16/2012 11:46  Glucose-Capillary Latest Range: 70-99 mg/dL 784 (H) 696 (H) 295 (H) 188 (H) 252 (H)    Inpatient Diabetes Program Recommendations Insulin - Basal: Add Levemir 9 units QHS (1/2 of home dose)    May benefit from small amt of meal coverage insulin since pt eating 100%.

## 2012-09-16 NOTE — Progress Notes (Addendum)
[facility] PROGRESS NOTE  Witt Plitt Altidor WUJ:[id number] DOB: 10-09-1934 DOA: 09/02/2012 PCP: [name], MD  Brief narrative: Brandon Clay is an 78 y.o. 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 difficile colitis requiring PO vancomycin, atrial fibrillation on chronic Coumadin, 3 vessel CAD with chronic occlusion of RCA, severe stenosis in proximal LCx & moderately severe disease in mid LAD, S/P PTCA, on ASA and Brilinta (Plavix nonresponder), recent admission 07/24/12-08/02/12 for treatment of C. Diff who was sent to the hospital from radiation for evaluation of generalized weakness, vomiting and diarrhea. Patient was receiving stereotactic body radiotherapy (#4 / 5 planned treatments) and was noticed to be very weak and dizzy upon standing up.  On admission, he was was noted to be in atrial fibrillation with heart rates in 150s. He also had significant leukocytosis of 32.7. He is currently being treated for recurrent Clostridium difficile infection as well as right lower extremity cellulitis.  After initially showing some improvement in his clinical status, his condition deteriorated on 09/07/12 requiring transfer back to the ICU for acute respiratory failure.  PCCM was consulting and is assisting with management. The patient's clinical status has improved with empiric antibiotics and steroids.  He is now medically stable for discharge, however the facility that has accepted him pending insurance coverage now has reservations about his transfer.  Awaiting call from medical director to discuss.  Assessment/Plan: Principal Problem:  Generalized weakness in the setting of nausea, vomiting, diarrhea, dehydration / Recurrent Clostridium difficile colitis -Likely from recurrent C. difficile colitis.  C. difficile PCR positive. N/V resolved. Diarrhea improved.  WBC normalized.   -Remains on oral vancomycin and Flagyl.   -Replace electrolytes as  needed.  -Continue antiemetics as needed. -Status post physical and occupational therapy evaluations: SNF versus 24 hour supervision recommended. Active Problems: Acute respiratory failure with dyspnea / hypoxia secondary to acute diastolic CHF and volume overload / R/O HCAP and radiation pneumonitis -Diuresed. -Pro BNP trending down 25,958 09/05/12--->12,670 09/08/12. -Chest x-ray done 09/07/2012: Persistent opacities worrisome for HCAP.  Empiric Levaquin and cefepime (already on vancomycin) started 09/08/2012. Now on Levaquin monotherapy. -Empiric steroids started by PCCM for radiation pneumonitis. Prednisone now on taper. Supratherapeutic INR -Given 0.5 mg of vitamin K 09/04/2012. -Coumadin dosing per pharmacy. Metabolic acidosis -Likely from bicarbonate losses in the stool. Corrected.   Right lower extremity cellulitis -Small fluctuant area noted RLE. Resolved on vancomycin. Atrial fibrillation with RVR  -Known history of atrial fibrillation, on Coumadin. Goal INR 2-3. -Initially treated with Cardizem drip and metoprolol. Cardizem drip discontinued secondary to low blood pressure and given digoxin overnight on 09/02/2012.  Now off Amiodarone drip. -Heart rate improved 90s-low 100s.  Avoid further amiodarone per PCCM recommendations.  Use digoxin and IV Cardizem, if needed per cardiology recs. Leukocytosis  -WBC normalized 09/08/12. H/O Hypertension / Hypotension  -Blood pressure currently well-controlled (and on low side). Hypercholesterolemia  -History of statin intolerance. 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 CBGs [id number].  -Metformin currently on hold.  Continue current insulin therapy. Squamous cell lung cancer left upper lobe  -Completed stereotactic body radiation 09/07/12. -Not a candidate for lung resection.   COPD (chronic obstructive pulmonary disease)  -Continue Symbicort.  No wheeze.  Empiric steroids started by PCCM on 09/07/12. CAD  (coronary artery disease), native coronary artery   -On outpatient Brilinta and aspirin.  -Now off Brilinta per cardiology recommendations. Anemia of chronic disease  -Secondary  to history multiple medical problems including squamous cell lung cancer.  -No signs of active bleed. Hemoglobin stable with no indication for transfusion. Benign prostatic hypertrophy  -Continue Finasteride and Flomax.  Code Status: Full  Family Communication: No one at bedside this morning. Disposition Plan: Home versus SNF depending on progress when stable.   Medical Consultants:  Dr. Olga Millers, Cardiology.  Dr. Lonia Farber, PCCM  Other Consultants:  Physical therapy: Skilled nursing home placement versus home with 24-hour supervision.  Anti-infectives:  Vancomycin 09/02/2012--->  Flagyl 09/02/2012---->  IV Vancomycin 09/03/12---> 09/10/2012  Cefepime 09/07/12---> 09/10/2012  Levaquin 09/07/12--->  HPI/Subjective: Brandon Clay is a bit irritable this morning because he is still in the hospital. He wants to go home. Denies any further problems with diarrhea. Appetite is picking up. No pain.  Objective: Filed Vitals:   09/15/12 1900 09/15/12 2100 09/16/12 0500 09/16/12 0534  BP: 115/49   115/64  Pulse: 120   108  Temp: 98.1 F (36.7 C)   97.2 F (36.2 C)  TempSrc: Oral   Oral  Resp: 18 18  16   Height:      Weight:   84.6 kg (186 lb 8.2 oz)   SpO2: 94%   99%    Intake/Output Summary (Last 24 hours) at 09/16/12 1007 Last data filed at 09/16/12 0535  Gross per 24 hour  Intake 335.83 ml  Output   1400 ml  Net -1064.17 ml    Exam: Gen:  NAD, non-toxic appearing Cardiovascular:  HSIR, tachy Respiratory:  Lungs clear to auscultation bilaterally Gastrointestinal:  Abdomen soft, NT/ND, + BS Extremities:  1+ edema  Data Reviewed: Basic Metabolic Panel:  Recent Labs Lab 09/12/12 0348 09/13/12 0340 09/14/12 0440 09/15/12 0430 09/16/12 0540  NA 129* 132* 134* 135  135  K 5.1 5.4* 5.4* 5.0 4.6  CL 100 102 103 104 103  CO2 24 24 25 25 24   GLUCOSE 296* 348* 98 93 227*  BUN 53* 46* 44* 34* 31*  CREATININE 1.21 1.12 1.13 1.03 1.08  CALCIUM 7.7* 7.6* 7.8* 7.7* 7.2*   GFR Estimated Creatinine Clearance: 61 ml/min (by C-G formula based on Cr of 1.08). Liver Function Tests:  Recent Labs Lab 09/11/12 0400  AST 43*  ALT 28  ALKPHOS 72  BILITOT 0.2*  PROT 4.2*  ALBUMIN 1.7*   Coagulation profile  Recent Labs Lab 09/12/12 0348 09/13/12 0340 09/14/12 0440 09/15/12 0430 09/16/12 0540  INR 2.05* 1.76* 1.87* 1.88* 1.77*    CBC:  Recent Labs Lab 09/12/12 0348 09/13/12 0340 09/14/12 0440 09/15/12 0430 09/16/12 0540  WBC 11.9* 12.2* 13.5* 9.2 8.1  HGB 8.7* 9.1* 9.4* 8.9* 8.7*  HCT 26.1* 27.8* 28.4* 27.0* 26.3*  MCV 79.6 79.9 78.7 80.4 80.4  PLT 237 206 202 170 154   BNP (last 3 results)  Recent Labs  09/07/12 0500 09/08/12 0335 09/11/12 0400  PROBNP 15589.0* 12670.0* 12704.0*   CBG:  Recent Labs Lab 09/15/12 1647 09/15/12 2121 09/15/12 2304 09/16/12 0006 09/16/12 0732  GLUCAP 207* 403* 351* 283* 188*   Microbiology Recent Results (from the past 240 hour(s))  URINE CULTURE     Status: None   Collection Time    09/07/12  4:26 PM      Result Value Range Status   Specimen Description URINE, CLEAN CATCH   Final   Special Requests NONE   Final   Culture  Setup Time 09/07/2012 21:17   Final   Colony Count >=100,000 COLONIES/ML   Final  Culture YEAST   Final   Report Status 09/08/2012 FINAL   Final  MRSA PCR SCREENING     Status: None   Collection Time    09/07/12  6:46 PM      Result Value Range Status   MRSA by PCR NEGATIVE  NEGATIVE Final   Comment:            The GeneXpert MRSA Assay (FDA     approved for NASAL specimens     only), is one component of a     comprehensive MRSA colonization     surveillance program. It is not     intended to diagnose MRSA     infection nor to guide or     monitor treatment  for     MRSA infections.     Procedures and Diagnostic Studies: No results found.  Scheduled Meds: . aspirin EC  81 mg Oral QODAY  . budesonide-formoterol  2 puff Inhalation BID  . digoxin  0.125 mg Oral Daily  . feeding supplement  237 mL Oral BID BM  . fluconazole  100 mg Oral Daily  . insulin aspart  0-20 Units Subcutaneous TID WC  . insulin aspart  0-5 Units Subcutaneous QHS  . levofloxacin  500 mg Oral Daily  . magic mouthwash w/lidocaine  5 mL Oral QID  . metroNIDAZOLE  500 mg Oral Q8H  . multivitamin with minerals  1 tablet Oral Daily  . predniSONE  50 mg Oral Q breakfast  . sodium chloride  3 mL Intravenous Q12H  . vancomycin  125 mg Oral Q6H  . warfarin  4 mg Oral ONCE-1800  . Warfarin - Pharmacist Dosing Inpatient   Does not apply q1800   Continuous Infusions: . sodium chloride 10 mL/hr at 09/14/12 0659    Time spent: 25 minutes.   LOS: 14 days   Nayquan Evinger  [facility] Pager [phone].  If 8PM-8AM, please contact night-coverage at [email or web address], password [facility] 09/16/2012, 10:07 AM

## 2012-09-17 LAB — CBC
Hemoglobin: 8.8 g/dL — ABNORMAL LOW (ref 13.0–17.0)
MCH: 26.1 pg (ref 26.0–34.0)
MCV: 80.4 fL (ref 78.0–100.0)
Platelets: 182 10*3/uL (ref 150–400)
RBC: 3.37 MIL/uL — ABNORMAL LOW (ref 4.22–5.81)

## 2012-09-17 LAB — GLUCOSE, CAPILLARY
Glucose-Capillary: 176 mg/dL — ABNORMAL HIGH (ref 70–99)
Glucose-Capillary: 293 mg/dL — ABNORMAL HIGH (ref 70–99)

## 2012-09-17 LAB — BASIC METABOLIC PANEL
CO2: 26 mEq/L (ref 19–32)
Calcium: 7.8 mg/dL — ABNORMAL LOW (ref 8.4–10.5)
Creatinine, Ser: 1.08 mg/dL (ref 0.50–1.35)
Glucose, Bld: 205 mg/dL — ABNORMAL HIGH (ref 70–99)

## 2012-09-17 MED ORDER — VANCOMYCIN 50 MG/ML ORAL SOLUTION: 125.0000 mg | Freq: Four times a day (QID) | ORAL | Status: AC

## 2012-09-17 MED ORDER — DILTIAZEM HCL 30 MG PO TABS: 30.0000 mg | ORAL_TABLET | Freq: Two times a day (BID) | ORAL | Status: DC

## 2012-09-17 MED ORDER — WARFARIN SODIUM 2.5 MG PO TABS
2.5000 mg | ORAL_TABLET | Freq: Once | ORAL | Status: AC
  Administered 2012-09-17: 2.5 mg via ORAL
  Filled 2012-09-17: qty 1

## 2012-09-17 MED ORDER — DILTIAZEM HCL 30 MG PO TABS
30.0000 mg | ORAL_TABLET | Freq: Two times a day (BID) | ORAL | Status: DC
  Administered 2012-09-17 (×2): 30 mg via ORAL
  Filled 2012-09-17 (×2): qty 1

## 2012-09-17 NOTE — Progress Notes (Signed)
Occupational Therapy Treatment Patient Details Name: Brandon Clay MRN: [id number] DOB: 1934-12-17 Today's Date: 09/17/2012 Time: 4098-1191 OT Time Calculation (min): 23 min  OT Assessment / Plan / Recommendation Comments on Treatment Session Pt making gains in OT.  very motivated.  Rest breaks given as HR increases    Follow Up Recommendations  SNF    Barriers to Discharge       Equipment Recommendations  None recommended by OT    Recommendations for Other Services    Frequency Min 2X/week   Plan Discharge plan remains appropriate    Precautions / Restrictions Precautions Precautions: Fall Precaution Comments: O2 dependent Restrictions Weight Bearing Restrictions: No   Pertinent Vitals/Pain No pain.  sats 98-100% 2 liters.  HR 98-139    ADL  Grooming: Min guard;Wash/dry face;Brushing hair Where Assessed - Grooming: Supported standing Lower Body Dressing: Set up (socks only with rest breaks) Where Assessed - Lower Body Dressing: Unsupported sitting Toilet Transfer: Simulated;Moderate assistance (for sit to stand and min A steps to bed) Equipment Used: Rolling walker Transfers/Ambulation Related to ADLs: SPT only  ADL Comments: 02 sats 98-100%  HR 98 - 139.  Rest breaks given    OT Diagnosis:    OT Problem List:   OT Treatment Interventions:     OT Goals ADL Goals Pt Will Perform Grooming: with supervision;Standing at sink ADL Goal: Grooming - Progress: Progressing toward goals Pt Will Perform Lower Body Dressing: with min assist;Sit to stand from chair ADL Goal: Lower Body Dressing - Progress: Goal set today Pt Will Transfer to Toilet: with supervision;Comfort height toilet ADL Goal: Toilet Transfer - Progress: Progressing toward goals  Visit Information  Last OT Received On: 09/17/12 Assistance Needed: +2    Subjective Data      Prior Functioning       Cognition  Cognition Overall Cognitive Status: Appears within functional limits for tasks  assessed/performed Arousal/Alertness: Awake/alert Orientation Level: Appears intact for tasks assessed Behavior During Session: [facility] for tasks performed    Mobility  Bed Mobility Bed Mobility: Supine to Sit Supine to Sit: HOB elevated;With rails;4: Min guard Sit to Supine: 3: Mod assist;HOB flat Details for Bed Mobility Assistance: vcs for technique Transfers Sit to Stand: 3: Mod assist;With armrests;With upper extremity assist;From chair/3-in-1 Details for Transfer Assistance: vcs for hand placement    Exercises     Balance     End of Session OT - End of Session Activity Tolerance: Patient tolerated treatment well Patient left: in bed;with call bell/phone within reach  GO     Taleigha Pinson 09/17/2012, 2:06 PM  Marica Otter, OTR/L [phone] 09/17/2012

## 2012-09-17 NOTE — Progress Notes (Signed)
Patients HR increased from 136 in bed to 176 while ambulating with physical therapy this am. MD notified. Patient not complaining of shortness of breath or pain. Will continue to monitor patient. Setzer, Don Broach

## 2012-09-17 NOTE — Progress Notes (Signed)
Report called to [facility] LPN at facility.

## 2012-09-17 NOTE — Progress Notes (Signed)
Patient HR increased to 162 this am while RN in room with patient. Patient was in bed having conversation at the time. EKG done to confirm afib RVR. BP 90/60 manually. MD notified. Cardiology notified. Digoxin given early per MD. Will continue to closely monitor patient. Setzer, Don Broach

## 2012-09-17 NOTE — Progress Notes (Signed)
ANTICOAGULATION CONSULT NOTE - Follow Up  Pharmacy Consult for Coumadin Indication: atrial fibrillation  No Known Allergies  Labs:  Recent Labs  09/15/12 0430 09/16/12 0540 09/17/12 0435  HGB 8.9* 8.7* 8.8*  HCT 27.0* 26.3* 27.1*  PLT 170 154 182  LABPROT 20.9* 20.0* 23.4*  INR 1.88* 1.77* 2.19*  CREATININE 1.03 1.08 1.08    Estimated Creatinine Clearance: 61 ml/min (by C-G formula based on Cr of 1.08).   Assessment: 78 yo M on chronic Coumadin 4mg /day for h/o atrial fibrillation. Coumadin held prior to admission due to supratherapeutic INR, resumed 3/30.   INR = 2.19, has finally responded to boosted dose of coumadin. Pt has been receiving smaller doses of coumadin due to supratherapeutic INR on admission with persistently high levels despite held coumadin and now d/t DDIs with flagyl, fluconazole, and levaquin. Noted stop dates for antibiotics.   CBC stable.    Tolerating diet.   Recommendation for discharge is to continue warfarin 2.5mg  daily with repeat check of INR on Friday or Saturday.  Once antibiotics and antifungals are stopped, pt can be resumed on home dose of coumadin with close follow-up.    Goal of Therapy:  INR 2-3 Monitor platelets by anticoagulation protocol: Yes   Plan:   Coumadin 2.5mg  PO x 1 today.  Daily PT/INR.  Haynes Hoehn, PharmD 09/17/2012 8:13 AM  Pager: [phone]

## 2012-09-17 NOTE — Progress Notes (Signed)
Clinical Social Work  CSW faxed updated PT note to [employer]. CSW called Steward Drone with insurance and left a message explaining that clinicals have been sent and patient is ready to dc once authorization is received. CSW will continue to follow.  Unk Lightning, LCSW (Coverage for [employer])

## 2012-09-17 NOTE — Progress Notes (Signed)
CSW faxed over updated PT note to [facility] @ [employer]. Awaiting call back from [place] re: prior authorization.   Unice Bailey, LCSW [facility] Clinical Social Worker cell #: [phone]

## 2012-09-17 NOTE — Progress Notes (Signed)
Subjective:  We had signed off on patient but were called back to see him today because of increase in heart rate to 140s.  Patient has permanent atrial fib, on digoxin and warfarin. Patient denies any awareness of palpitations or chest pain or dyspnea. Awaiting transfer to SNF. EF 55-60% by recent echo.  Objective:  Vital Signs in the last 24 hours: Temp:  [97.9 F (36.6 C)-98.2 F (36.8 C)] 97.9 F (36.6 C) (04/11 0544) Pulse Rate:  [103-143] 143 (04/11 0839) Resp:  [16] 16 (04/11 0544) BP: (114-122)/(70-77) 122/70 mmHg (04/11 0544) SpO2:  [90 %-99 %] 98 % (04/11 0544) Weight:  [182 lb 5.1 oz (82.7 kg)] 182 lb 5.1 oz (82.7 kg) (04/11 0544)  Intake/Output from previous day: 04/10 0701 - 04/11 0700 In: 720 [P.O.:720] Out: 926 [Urine:925; Stool:1] Intake/Output from this shift: Total I/O In: 240 [P.O.:240] Out: -   . aspirin EC  81 mg Oral QODAY  . budesonide-formoterol  2 puff Inhalation BID  . digoxin  0.125 mg Oral Daily  . fluconazole  100 mg Oral Daily  . insulin aspart  0-20 Units Subcutaneous TID WC  . insulin aspart  0-5 Units Subcutaneous QHS  . magic mouthwash w/lidocaine  5 mL Oral QID  . metroNIDAZOLE  500 mg Oral Q8H  . multivitamin with minerals  1 tablet Oral Daily  . predniSONE  50 mg Oral Q breakfast  . sodium chloride  3 mL Intravenous Q12H  . vancomycin  125 mg Oral Q6H  . warfarin  2.5 mg Oral ONCE-1800  . Warfarin - Pharmacist Dosing Inpatient   Does not apply q1800   . sodium chloride 10 mL/hr at 09/14/12 5284    Physical Exam: The patient appears to be in no distress.  Head and neck exam reveals that the pupils are equal and reactive.  The extraocular movements are full.  There is no scleral icterus.  Mouth and pharynx are benign.  No lymphadenopathy.  No carotid bruits.  The jugular venous pressure is normal.  Thyroid is not enlarged or tender.  Chest is clear to percussion and auscultation.  No rales or rhonchi.  Expansion of the chest is  symmetrical.  Heart reveals no abnormal lift or heave.  First and second heart sounds are normal.  There is no murmur gallop rub or click. Pulse rapid and irregular.  The abdomen is soft and nontender.  Bowel sounds are normoactive.  There is no hepatosplenomegaly or mass.  There are no abdominal bruits.   Lab Results:  Recent Labs  09/16/12 0540 09/17/12 0435  WBC 8.1 9.0  HGB 8.7* 8.8*  PLT 154 182    Recent Labs  09/16/12 0540 09/17/12 0435  NA 135 135  K 4.6 4.6  CL 103 104  CO2 24 26  GLUCOSE 227* 205*  BUN 31* 29*  CREATININE 1.08 1.08   No results found for this basename: TROPONINI, CK, MB,  in the last 72 hours Hepatic Function Panel No results found for this basename: PROT, ALBUMIN, AST, ALT, ALKPHOS, BILITOT, BILIDIR, IBILI,  in the last 72 hours No results found for this basename: CHOL,  in the last 72 hours No results found for this basename: PROTIME,  in the last 72 hours  Imaging: Imaging results have been reviewed  Cardiac Studies: EKG shows atrial fib with heart rate 150s.  No acute ischemic changes. Assessment/Plan:  Atrial fibrillation with rapid ventricular response.  Normal LV systolic function. History of soft BP  Plan:  will add very small dose of cardizem 30 mg BID with parameters (at home he had been on cardizem CD 180 mg daily). Continue digoxin.  LOS: 15 days    Cassell Clement 09/17/2012, 9:44 AM

## 2012-09-17 NOTE — Progress Notes (Signed)
Clinical Social Work  CSW received authorization from Community education officer. Authorization # [id number]. CSW spoke with [facility] who is ready to accept patient. CSW informed patient, dtr and RN of dc plans and all parties agreeable. Dtr prefers [facility] transport patient to facility. CSW prepared DC packet and coordinated transportation via [facility]. CSW is signing off but available if needed.  Unk Lightning, LCSW  (Coverage for [employer])

## 2012-09-17 NOTE — Progress Notes (Signed)
[facility] PROGRESS NOTE  Brandon Clay FAO:[id number] DOB: 07-19-1934 DOA: 09/02/2012 PCP: [name], MD  Brief narrative: Brandon Clay is an 78 y.o. 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 difficile colitis requiring PO vancomycin, atrial fibrillation on chronic Coumadin, 3 vessel CAD with chronic occlusion of RCA, severe stenosis in proximal LCx & moderately severe disease in mid LAD, S/P PTCA, on ASA and Brilinta (Plavix nonresponder), recent admission 07/24/12-08/02/12 for treatment of C. Diff who was sent to the hospital from radiation for evaluation of generalized weakness, vomiting and diarrhea. Patient was receiving stereotactic body radiotherapy (#4 / 5 planned treatments) and was noticed to be very weak and dizzy upon standing up.  On admission, he was was noted to be in atrial fibrillation with heart rates in 150s. He also had significant leukocytosis of 32.7. He is currently being treated for recurrent Clostridium difficile infection as well as right lower extremity cellulitis.  After initially showing some improvement in his clinical status, his condition deteriorated on 09/07/12 requiring transfer back to the ICU for acute respiratory failure.  PCCM was consulting and is assisting with management. The patient's clinical status has improved with empiric antibiotics and steroids.  He is now medically stable for discharge, however the facility that has accepted him pending insurance coverage now has reservations about his transfer.  Awaiting call from medical director to discuss.  Assessment/Plan: Principal Problem:  Generalized weakness in the setting of nausea, vomiting, diarrhea, dehydration / Recurrent Clostridium difficile colitis -Likely from recurrent C. difficile colitis.  C. difficile PCR positive. N/V resolved. Diarrhea improved.  WBC normalized.   -Remains on oral vancomycin and Flagyl.   -Replace electrolytes as  needed.  -Continue antiemetics as needed. -Status post physical and occupational therapy evaluations: SNF versus 24 hour supervision recommended. Active Problems: Acute respiratory failure with dyspnea / hypoxia secondary to acute diastolic CHF and volume overload / R/O HCAP and radiation pneumonitis -Diuresed. -Pro BNP trending down 25,958 09/05/12--->12,670 09/08/12. -Status post a course of antibiotics including Cefipime, Vancomycin and Levaquin. -Empiric steroids started by PCCM for radiation pneumonitis. Prednisone now on taper. Supratherapeutic INR -Given 0.5 mg of vitamin K 09/04/2012. -Coumadin dosing per pharmacy. Metabolic acidosis -Likely from bicarbonate losses in the stool. Corrected.   Right lower extremity cellulitis -Small fluctuant area noted RLE. Resolved on vancomycin. Atrial fibrillation with RVR  -Known history of atrial fibrillation, on Coumadin. Goal INR 2-3. -Avoid further amiodarone per PCCM recommendations.  Use digoxin and IV Cardizem, if needed per cardiology recs. -Cardizem added 09/17/12 per cardiology for better rate control. Digoxin level 0.8. Leukocytosis  -WBC normalized 09/08/12. H/O Hypertension / Hypotension  -Blood pressure currently well-controlled. Hypercholesterolemia  -History of statin intolerance. 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  -Metformin currently on hold.  Continue current insulin therapy. Squamous cell lung cancer left upper lobe  -Completed stereotactic body radiation 09/07/12. -Not a candidate for lung resection.   COPD (chronic obstructive pulmonary disease)  -Continue Symbicort.  No wheeze.  Empiric steroids started by PCCM on 09/07/12, on taper. CAD (coronary artery disease), native coronary artery   -On outpatient Brilinta and aspirin.  -Now off Brilinta per cardiology recommendations. Anemia of chronic disease  -Secondary to history multiple medical problems including squamous cell lung  cancer.  -No signs of active bleed. Hemoglobin stable with no indication for transfusion. Benign prostatic hypertrophy  -Continue Finasteride and Flomax.  Code Status: Full  Family Communication: No one at bedside this morning. Disposition Plan: SNF.   Medical Consultants:  Dr. Olga Millers, Cardiology.  Dr. Lonia Farber, PCCM  Other Consultants:  Physical therapy: Skilled nursing home placement versus home with 24-hour supervision.  Anti-infectives:  Vancomycin 09/02/2012--->  Flagyl 09/02/2012---->  IV Vancomycin 09/03/12---> 09/10/2012  Cefepime 09/07/12---> 09/10/2012  Levaquin 09/07/12--->09/16/12  HPI/Subjective: Brandon Clay is sitting up in a chair and feels well. Appetite good. Walk down the hall with physical therapy today. Denies chest pain palpitations or shortness of breath. Feels well. No diarrhea. Wants to go to rehabilitation and if they will not take him, wants to go home.  Objective: Filed Vitals:   09/17/12 0544 09/17/12 0830 09/17/12 0839 09/17/12 1057  BP: 122/70 90/60  114/72  Pulse: 110  143   Temp: 97.9 F (36.6 C)     TempSrc: Oral     Resp: 16     Height:      Weight: 82.7 kg (182 lb 5.1 oz)     SpO2: 98%       Intake/Output Summary (Last 24 hours) at 09/17/12 1352 Last data filed at 09/17/12 1004  Gross per 24 hour  Intake    720 ml  Output   1051 ml  Net   -331 ml    Exam: Gen:  NAD, non-toxic appearing Cardiovascular:  HSIR, tachy Respiratory:  Lungs clear to auscultation bilaterally Gastrointestinal:  Abdomen soft, NT/ND, + BS Extremities:  1+ edema  Data Reviewed: Basic Metabolic Panel:  Recent Labs Lab 09/13/12 0340 09/14/12 0440 09/15/12 0430 09/16/12 0540 09/17/12 0435  NA 132* 134* 135 135 135  K 5.4* 5.4* 5.0 4.6 4.6  CL 102 103 104 103 104  CO2 24 25 25 24 26   GLUCOSE 348* 98 93 227* 205*  BUN 46* 44* 34* 31* 29*  CREATININE 1.12 1.13 1.03 1.08 1.08  CALCIUM 7.6* 7.8* 7.7* 7.2* 7.8*    GFR Estimated Creatinine Clearance: 61 ml/min (by C-G formula based on Cr of 1.08). Liver Function Tests:  Recent Labs Lab 09/11/12 0400  AST 43*  ALT 28  ALKPHOS 72  BILITOT 0.2*  PROT 4.2*  ALBUMIN 1.7*   Coagulation profile  Recent Labs Lab 09/13/12 0340 09/14/12 0440 09/15/12 0430 09/16/12 0540 09/17/12 0435  INR 1.76* 1.87* 1.88* 1.77* 2.19*    CBC:  Recent Labs Lab 09/13/12 0340 09/14/12 0440 09/15/12 0430 09/16/12 0540 09/17/12 0435  WBC 12.2* 13.5* 9.2 8.1 9.0  HGB 9.1* 9.4* 8.9* 8.7* 8.8*  HCT 27.8* 28.4* 27.0* 26.3* 27.1*  MCV 79.9 78.7 80.4 80.4 80.4  PLT 206 202 170 154 182   BNP (last 3 results)  Recent Labs  09/07/12 0500 09/08/12 0335 09/11/12 0400  PROBNP 15589.0* 12670.0* 12704.0*   CBG:  Recent Labs Lab 09/16/12 1146 09/16/12 1734 09/16/12 2146 09/17/12 0703 09/17/12 1221  GLUCAP 252* 244* 255* 176* 293*   Microbiology Recent Results (from the past 240 hour(s))  URINE CULTURE     Status: None   Collection Time    09/07/12  4:26 PM      Result Value Range Status   Specimen Description URINE, CLEAN CATCH   Final   Special Requests NONE   Final   Culture  Setup Time 09/07/2012 21:17   Final   Colony Count >=100,000 COLONIES/ML   Final   Culture YEAST   Final   Report Status 09/08/2012 FINAL   Final  MRSA PCR SCREENING     Status: None  Collection Time    09/07/12  6:46 PM      Result Value Range Status   MRSA by PCR NEGATIVE  NEGATIVE Final   Comment:            The GeneXpert MRSA Assay (FDA     approved for NASAL specimens     only), is one component of a     comprehensive MRSA colonization     surveillance program. It is not     intended to diagnose MRSA     infection nor to guide or     monitor treatment for     MRSA infections.     Procedures and Diagnostic Studies: No results found.  Scheduled Meds: . aspirin EC  81 mg Oral QODAY  . budesonide-formoterol  2 puff Inhalation BID  . digoxin  0.125  mg Oral Daily  . diltiazem  30 mg Oral BID  . fluconazole  100 mg Oral Daily  . insulin aspart  0-20 Units Subcutaneous TID WC  . insulin aspart  0-5 Units Subcutaneous QHS  . magic mouthwash w/lidocaine  5 mL Oral QID  . metroNIDAZOLE  500 mg Oral Q8H  . multivitamin with minerals  1 tablet Oral Daily  . predniSONE  50 mg Oral Q breakfast  . sodium chloride  3 mL Intravenous Q12H  . vancomycin  125 mg Oral Q6H  . warfarin  2.5 mg Oral ONCE-1800  . Warfarin - Pharmacist Dosing Inpatient   Does not apply q1800   Continuous Infusions: . sodium chloride 10 mL/hr at 09/14/12 0659    Time spent: 25 minutes.   LOS: 15 days   Brandon Clay  [facility] Pager [phone].  If 8PM-8AM, please contact night-coverage at [email or web address], password [facility] 09/17/2012, 1:52 PM

## 2012-09-17 NOTE — Progress Notes (Addendum)
Physical Therapy Treatment Patient Details Name: Brandon Clay MRN: [id number] DOB: 08-Jun-1935 Today's Date: 09/17/2012 Time: 8469-6295 PT Time Calculation (min): 32 min  PT Assessment / Plan / Recommendation Comments on Treatment Session  Pt able to progress ambulation distance today however limited by tachycardia.  Pt also performed a few exercises today.  Remained on O2 throughout session.  Pt making good progress and only limited ambulation due to tachycardia (RN in to check all vitals after exercises).  Feel pt would greatly benefit from SNF and tolerate up to 1 hour of PT daily for rehab.  Pt hopeful to d/c to SNF today.    Follow Up Recommendations  SNF     Does the patient have the potential to tolerate intense rehabilitation     Barriers to Discharge        Equipment Recommendations  Rolling walker with 5" wheels;Wheelchair (measurements PT);Wheelchair cushion (measurements PT)    Recommendations for Other Services    Frequency     Plan Discharge plan remains appropriate;Frequency remains appropriate    Precautions / Restrictions Precautions Precautions: Fall Precaution Comments: O2 dependent   Pertinent Vitals/Pain RN alerted PT to telemetry monitoring reporting HR up to 170s during ambulation    Mobility  Bed Mobility Bed Mobility: Supine to Sit Supine to Sit: HOB elevated;With rails;4: Min guard Details for Bed Mobility Assistance: verbal cues for technique Transfers Transfers: Sit to Stand;Stand to Sit Sit to Stand: 1: +2 Total assist;From bed;From chair/3-in-1;With upper extremity assist Sit to Stand: Patient Percentage: 70% Stand to Sit: 1: +2 Total assist;To chair/3-in-1;With upper extremity assist Stand to Sit: Patient Percentage: 70% Details for Transfer Assistance: verbal cues for safe technique including full hip and trunk extension upon standing, max cues for using armrests with transfers Ambulation/Gait Ambulation/Gait Assistance: 1: +2 Total  assist Ambulation/Gait: Patient Percentage: 70% Ambulation Distance (Feet): 41 Feet Assistive device: Rolling walker Ambulation/Gait Assistance Details: 41 feet total: 10 x1, 18 x1, 13 x1.  Pt required seated rest breaks inbetween due to fatigue, also RN notified PT of elevated HR during ambulation for last 2 sets of ambulation 150s and then 170s so stopped due to tachycardia, max verbal cues for trunk/hip extension, RW distance, step length Gait Pattern: Step-to pattern;Trunk flexed Gait velocity: decreased General Gait Details: able to tolerate more ambulation today    Exercises General Exercises - Lower Extremity Ankle Circles/Pumps: AROM;20 reps;Seated;Both;Supine Quad Sets: AROM;Both;15 reps;Supine Heel Slides: AROM;Both;10 reps;Supine Hip ABduction/ADduction: AROM;10 reps;Supine;Both   PT Diagnosis:    PT Problem List:   PT Treatment Interventions:     PT Goals Acute Rehab PT Goals PT Goal: Supine/Side to Sit - Progress: Progressing toward goal PT Goal: Sit to Stand - Progress: Progressing toward goal PT Goal: Ambulate - Progress: Progressing toward goal  Visit Information  Last PT Received On: 09/17/12 Assistance Needed: +2    Subjective Data  Subjective: I'm ready to get out of here.   Cognition  Cognition Overall Cognitive Status: Appears within functional limits for tasks assessed/performed Arousal/Alertness: Awake/alert Orientation Level: Appears intact for tasks assessed Behavior During Session: [facility] for tasks performed    Balance     End of Session PT - End of Session Equipment Utilized During Treatment: Oxygen Activity Tolerance: Patient tolerated treatment well;Other (comment) (other than tachycardia during gait) Patient left: in chair;with call bell/phone within reach;with nursing in room   GP     Tyesha Joffe,KATHrine E 09/17/2012, 1:06 PM Zenovia Jarred, PT, DPT 09/17/2012 Pager: [phone]

## 2012-09-19 ENCOUNTER — Telehealth (HOSPITAL_COMMUNITY): Payer: Self-pay | Admitting: Internal Medicine

## 2012-09-19 ENCOUNTER — Telehealth: Payer: Self-pay | Admitting: Cardiology

## 2012-09-19 NOTE — Telephone Encounter (Addendum)
Brandon Clay daughter called to report her concern regarding her father's care at [facility] in [place], [place]. He has had several admissions recently and was most recently discharged on 09/14/2012 from [facility] after admission for severe sepsis, C.diff.colitis, respiratory failure with known lung CA (per TCTS not a candidate for resection). Brandon Clay has noticed increased swelling in her father's legs, abdomen and now arms. He has also complained that his "underwear is too tight." Brandon Clay states "he has fluid seeping out everywhere." She discussed her concerns with the SNF nurse who explained the attending MD would be by later to assess him. I discussed this with Dr. Gala Romney today. Dr. Gala Romney contacted Brandon Clay's SNF nurse, Brandon Clay, to give order for IV Lasix. In addition, daily weights were recommended and follow-up BMET. Brandon Clay stated she will contact the attending MD ASAP. Dr. Gala Romney provided his cell phone number should the attending MD have any questions or concerns. I called Brandon Clay with an update. She expressed verbal understanding and gratitude for our help.

## 2012-09-19 NOTE — Telephone Encounter (Signed)
Spoke to Dr. Charlott Rakes about the situation. He was frustrated that patient came to SNF without orders for diuretic. Weight on admit to SNF was 183. Now 181. In looking back over chart baseline weight was 161-167. Was on lasix 20 daily with occasional 40 daily for flares. I conveyed this information to Dr. Yetta Flock. Recommended po demadex to start diuresis if not going to give IV. They will institute facility diuretic flowsheet. Told him to call me if he had any questions or patient needed to come to hospital.   Truman Hayward 3:11 PM

## 2012-09-20 ENCOUNTER — Ambulatory Visit: Payer: Medicare Other | Admitting: Cardiovascular Disease

## 2012-09-20 NOTE — Telephone Encounter (Signed)
Brandon Clay- we did not discharge him He was septic and dehydrated on admission and had a BUN of 30 on discharge.  Seems appropriate that he was not discharged on a diuretic and the SNF MD needs to follow if he needed diuretic added back.  He was discharged and primarily cared for by hospitalist

## 2012-09-21 ENCOUNTER — Ambulatory Visit (INDEPENDENT_AMBULATORY_CARE_PROVIDER_SITE_OTHER): Payer: Medicare Other | Admitting: Cardiovascular Disease

## 2012-09-21 ENCOUNTER — Encounter: Payer: Self-pay | Admitting: Cardiovascular Disease

## 2012-09-21 ENCOUNTER — Ambulatory Visit: Payer: Medicare Other | Admitting: Cardiovascular Disease

## 2012-09-21 VITALS — BP 80/60 | HR 80 | Wt 169.0 lb

## 2012-09-21 DIAGNOSIS — E1159 Type 2 diabetes mellitus with other circulatory complications: Secondary | ICD-10-CM

## 2012-09-21 DIAGNOSIS — I2510 Atherosclerotic heart disease of native coronary artery without angina pectoris: Secondary | ICD-10-CM

## 2012-09-21 DIAGNOSIS — I959 Hypotension, unspecified: Secondary | ICD-10-CM

## 2012-09-21 DIAGNOSIS — R609 Edema, unspecified: Secondary | ICD-10-CM | POA: Insufficient documentation

## 2012-09-21 DIAGNOSIS — I798 Other disorders of arteries, arterioles and capillaries in diseases classified elsewhere: Secondary | ICD-10-CM

## 2012-09-21 DIAGNOSIS — I4891 Unspecified atrial fibrillation: Secondary | ICD-10-CM

## 2012-09-21 NOTE — Patient Instructions (Signed)
Your physician recommends that you schedule a follow-up appointment in:   3 MONTHS WITH DR [facility] Your physician has recommended you make the following change in your medication:  WAS STARTED ON DEMADEX 10 MG  TWICE A DAY TODAY AND LASIX WAS STOPPED YESTERDAY   Your physician recommends that you return for lab work in: * 2 WEEKS  BMET

## 2012-09-21 NOTE — Progress Notes (Signed)
Patient ID: Brandon Clay, male   DOB: 1934-06-16, 78 y.o.   MRN: [id number] Brandon Clay is a 78 y.o. male who returns for follow up. He has a hx of HTN, DM2, COPD, tobacco abuse. He established with me 1/14 for surgical clearance for lobectomy for lung cancer. Myoview was abnormal with EF 45%, lateral and inferolateral scar with peri-infarct ischemia. LHC 07/02/12: dLM 20%, mLAD 60%, small D2 99%, pCFX 99%, dAVCFX 40%, pRCA 80%, mRCA 100%, distal filled with L-R collats, EF 45%. Patient was too high risk for CABG. He underwent PCI with BMS to the pCFX. He had a high PRU on Plavix and was switched to Brilinta. DAPT recommended x at least 1 month. He was then admitted 07/2012 with dehydration and AFib with RVR in the setting of CDiff colitis. He was placed on coumadin. Echo 07/25/12: mild to mod LVH, EF 60-65%, mild BAE. He underwent TEE-DCCV but returned to AFib again. He was seen by Dr. Johney Frame for EP. Best option felt to be rate control and anticoagulation. Options of amiodarone or Tikosyn could be considered after lung surgery. Of note, Dr. [name] saw the patient in the hospital and felt he was too weak to proceed with lung resection and alternative treatments would need to be pursued. He has since been set up with radiation Rx. I saw him 3/5 and he was volume overloaded. I increased his Lasix.   Deconditioned with orthostasis  Diuretics held but then becomes volume overloaded at [facility].  Long discussion with family  He has unresected lung CA.  He is deconditioned.   Hospice care should be considered HIs LUE is swollen from iv start for ? Lasix.  From a heart perspective he is stable with rate controlled afib and no chest pain now off Brillinta.  Called NH and they started demedex 10 bid today.  Also discussed issue of anticoagulation with unresected Lung CA , orthostasis and risk of fall  Low threshold to stop His diarhea has finally slowed down.  He is on steroids which may be contributing to edema  ROS:  Denies fever, malais, weight loss, blurry vision, decreased visual acuity, cough, sputum, SOB, hemoptysis, pleuritic pain, palpitaitons, heartburn, abdominal pain, melena, lower extremity edema, claudication, or rash.  All other systems reviewed and negative  General: Affect appropriate Chronically ill male on oxygen HEENT: normal Neck supple with no adenopathy JVP normal no bruits no thyromegaly Lungs clear with no wheezing and good diaphragmatic motion Heart:  S1/S2 no murmur, no rub, gallop or click PMI normal Abdomen: benighn, BS positve, no tenderness, no AAA no bruit.  No HSM or HJR Distal pulses intact with no bruits Plus two bilateral edema.  Edema in left forearm Neuro non-focal Skin weeping with mulitple echymosis in legs and arms No muscular weakness   Current Outpatient Prescriptions  Medication Sig Dispense Refill  . aspirin EC 81 MG tablet Take 81 mg by mouth every other day.      . budesonide-formoterol (SYMBICORT) 160-4.5 MCG/ACT inhaler Inhale 2 puffs into the lungs 2 (two) times daily.      . digoxin (LANOXIN) 0.125 MG tablet Take 1 tablet (0.125 mg total) by mouth daily.  30 tablet  0  . diltiazem (CARDIZEM) 30 MG tablet Take 1 tablet (30 mg total) by mouth 2 (two) times daily at 10 AM and 5 PM.      . finasteride (PROSCAR) 5 MG tablet Take 5 mg by mouth every morning.       Marland Kitchen  fluconazole (DIFLUCAN) 100 MG tablet Take 1 tablet (100 mg total) by mouth daily.  7 tablet  0  . insulin detemir (LEVEMIR) 100 UNIT/ML injection Inject 0.1 mLs (10 Units total) into the skin at bedtime.  10 mL  0  . lidocaine (XYLOCAINE) 2 % solution Take 20 mLs by mouth every 3 (three) hours as needed.  100 mL    . loperamide (IMODIUM A-D) 2 MG tablet Take 2 mg by mouth 4 (four) times daily as needed for diarrhea or loose stools.      . metoprolol tartrate (LOPRESSOR) 25 MG tablet Take 1 tablet (25 mg total) by mouth 2 (two) times daily.  60 tablet  0  . Multiple Vitamin (MULTIVITAMIN WITH  MINERALS) TABS Take 1 tablet by mouth daily.  30 tablet  0  . predniSONE (DELTASONE) 10 MG tablet Take 4 tablets (40 mg total) by mouth daily with breakfast.  10 tablet  0  . Tamsulosin HCl (FLOMAX) 0.4 MG CAPS Take 0.4 mg by mouth every morning.       . warfarin (COUMADIN) 2 MG tablet Take 1 tablet (2 mg total) by mouth daily at 6 PM.  30 tablet  0   No current facility-administered medications for this visit.    Allergies  Review of patient's allergies indicates no known allergies.  Electrocardiogram:  09/02/12  afib rate 157  Nonspecific ST/T wave changes dig effect  Assessment and Plan

## 2012-09-21 NOTE — Assessment & Plan Note (Signed)
Malnourished with lung CA on steroids.  Continue demedex 10 bid as BP tolerates Personally spoke with nursing home to confirm this was on his med list and arrange BMET in two weeks

## 2012-09-21 NOTE — Assessment & Plan Note (Signed)
Continue rate cotrolling drugs He is unaware of afib Still on coumadin but low threshold to stop given prognosis and risk of fall

## 2012-09-21 NOTE — Assessment & Plan Note (Signed)
May get worse with demedex but needs some diuretic at this point.  Orthostasis from DM, XRT and deconditioning.  On steroids  Primary can consider checking random cortisol

## 2012-09-21 NOTE — Assessment & Plan Note (Signed)
Discussed low carb diet.  Target hemoglobin A1c is 6.5 or less.  Continue current medications.  

## 2012-09-21 NOTE — Assessment & Plan Note (Signed)
Stable with no angina and good activity level.  Continue medical Rx Off Brillinta  

## 2012-09-21 NOTE — Assessment & Plan Note (Signed)
Finished with XRT  F/U oncology Still with severe protein caloric malnutrition and deconditioning Continue Rx at [facility]

## 2012-10-05 ENCOUNTER — Inpatient Hospital Stay: Payer: Medicare Other | Admitting: Internal Medicine

## 2012-10-08 ENCOUNTER — Encounter: Payer: Self-pay | Admitting: Radiation Oncology

## 2012-10-08 DIAGNOSIS — Z923 Personal history of irradiation: Secondary | ICD-10-CM | POA: Insufficient documentation

## 2012-10-11 ENCOUNTER — Encounter: Payer: Self-pay | Admitting: Radiation Oncology

## 2012-10-11 ENCOUNTER — Ambulatory Visit
Admit: 2012-10-11 | Discharge: 2012-10-11 | Disposition: A | Discharge status: Home / Self Care | Payer: Medicare Other | Attending: Radiation Oncology | Admitting: Radiation Oncology

## 2012-10-11 VITALS — BP 113/82 | HR 98 | Temp 98.0°F | Resp 20 | Wt 153.7 lb

## 2012-10-11 DIAGNOSIS — C3492 Malignant neoplasm of unspecified part of left bronchus or lung: Secondary | ICD-10-CM

## 2012-10-11 NOTE — Progress Notes (Signed)
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  Interval history is significant for the patient being discharged from rehabilitation unit 2 days ago. He continues on vancomycin for management of his C. difficile infection. He denies any further problems with diarrhea. He denies any cough shortness of breath or hemoptysis. Overall he is feeling well at this time. He is happy to be home.                              ALLERGIES:  has No Known Allergies.  Meds: Current Outpatient Prescriptions  Medication Sig Dispense Refill  . aspirin EC 81 MG tablet Take 81 mg by mouth every other day.      . budesonide-formoterol (SYMBICORT) 160-4.5 MCG/ACT inhaler Inhale 2 puffs into the lungs 2 (two) times daily.      . digoxin (LANOXIN) 0.125 MG tablet Take 1 tablet (0.125 mg total) by mouth daily.  30 tablet  0  . diltiazem (CARDIZEM) 30 MG tablet Take 1 tablet (30 mg total) by mouth 2 (two) times daily at 10 AM and 5 PM.      . finasteride (PROSCAR) 5 MG tablet Take 5 mg by mouth every morning.       Marland Kitchen HYDROcodone-acetaminophen (NORCO/VICODIN) 5-325 MG per tablet       . insulin detemir (LEVEMIR) 100 UNIT/ML injection Inject 0.1 mLs (10 Units total) into the skin at bedtime.  10 mL  0  . lidocaine (XYLOCAINE) 2 % solution Take 20 mLs by mouth every 3 (three) hours as needed.  100 mL    . loperamide (IMODIUM A-D) 2 MG tablet Take 2 mg by mouth 4 (four) times daily as needed for diarrhea or loose stools.      . metoprolol tartrate (LOPRESSOR) 25 MG tablet Take 1 tablet (25 mg total) by mouth 2 (two) times daily.  60 tablet  0  . Multiple Vitamin  (MULTIVITAMIN WITH MINERALS) TABS Take 1 tablet by mouth daily.  30 tablet  0  . potassium chloride (K-DUR) 10 MEQ tablet       . Tamsulosin HCl (FLOMAX) 0.4 MG CAPS Take 0.4 mg by mouth every morning.       . warfarin (COUMADIN) 2 MG tablet Take 1 tablet (2 mg total) by mouth daily at 6 PM.  30 tablet  0   No current facility-administered medications for this encounter.    Physical Findings: The patient is in no acute distress. Patient is alert and oriented.  weight is 153 lb 11.2 oz (69.718 kg). His oral temperature is 98 F (36.7 C). His blood pressure is 113/82 and his pulse is 98. His respiration is 20. Marland Kitchen  No palpable supraclavicular or axillary adenopathy. The lungs are clear. The heart has an irregular rhythm consistent with atrial fibrillation. Patient has much less peripheral edema in his arms and legs at this time.  Lab Findings: Lab Results  Component Value Date   WBC 9.0 09/17/2012   HGB 8.8* 09/17/2012   HCT 27.1* 09/17/2012   MCV 80.4 09/17/2012   PLT 182 09/17/2012  Radiographic Findings: Dg Chest Port 1 View  09/14/2012  *RADIOLOGY REPORT*  Clinical Data: Lung cancer.  Follow up of "infiltrate".  PORTABLE CHEST - 1 VIEW  Comparison: 09/10/2012  Findings: Midline trachea.  Cardiomegaly accentuated by AP portable technique.  Similar small pleural effusion. No pneumothorax.  No significant change in the left greater than right lower lobe predominant airspace disease.  Left upper lobe and mass is partially obscured by underlying airspace disease.  IMPRESSION: No significant change since the prior exam.  Diffuse interstitial and airspace opacities, lower lobe predominant.  Considerations include infection and/or pulmonary edema.  Similar small left pleural effusion.  Left upper lobe lung mass.   Original Report Authenticated By: Jeronimo Greaves, M.D.     Impression:  The patient is doing much better at this time.  Plan:  Routine followup in 2 months. At that time the patient will  have a CT scan to assess his response to his SBRT.  Marland Kitchen Patient will see Dr. [name] later this week with a chest x-ray ordered at that time.  _____________________________________  -----------------------------------  [name], PhD, MD

## 2012-10-11 NOTE — Progress Notes (Signed)
Pt was d/c from rehab facility 2 days ago. He will continue PT at home. He is on Vancomycin, tapering off per daughter. Pt denies pain, cough, SOB, loss of appetite. He is using a walker at home.

## 2012-10-14 ENCOUNTER — Ambulatory Visit: Payer: Medicare Other | Admitting: Cardiothoracic Surgery

## 2012-10-18 ENCOUNTER — Telehealth: Payer: Self-pay | Admitting: Dietician

## 2012-10-20 ENCOUNTER — Inpatient Hospital Stay: Payer: Medicare Other | Admitting: Internal Medicine

## 2012-10-26 ENCOUNTER — Other Ambulatory Visit: Payer: Self-pay | Admitting: *Deleted

## 2012-10-28 ENCOUNTER — Ambulatory Visit (INDEPENDENT_AMBULATORY_CARE_PROVIDER_SITE_OTHER): Payer: Medicare Other | Admitting: Cardiothoracic Surgery

## 2012-10-28 ENCOUNTER — Ambulatory Visit
Admission: RE | Admit: 2012-10-28 | Discharge: 2012-10-28 | Disposition: A | Discharge status: Home / Self Care | Payer: Medicare Other | Source: Ambulatory Visit | Attending: Cardiothoracic Surgery | Admitting: Cardiothoracic Surgery

## 2012-10-28 ENCOUNTER — Encounter: Payer: Self-pay | Admitting: Cardiothoracic Surgery

## 2012-10-28 DIAGNOSIS — C3410 Malignant neoplasm of upper lobe, unspecified bronchus or lung: Secondary | ICD-10-CM

## 2012-10-28 DIAGNOSIS — R911 Solitary pulmonary nodule: Secondary | ICD-10-CM

## 2012-10-28 NOTE — Progress Notes (Signed)
[address]       [place] [address]             [phone]         TSUTOMU BARFOOT [facility] Medical Record #[id number] Date of Birth: 1935/04/14  Referring: Dr  Abran Cantor Primary Care: [name], MD  Chief Complaint:    No chief complaint on file.   History of Present Illness:    Patient returns now after several months of a complex medical treatment including at least 2 episodes of prolonged stays in a nursing home. He has undergone stereotactic radiotherapy for the left upper lobe lung nodule. During this period C. difficile again flared up and he required hospitalization. Functionally he is now much improved, back in home ambulating well and not on oxygen any longer. He has lost a significant amount of weight but is slowly gaining his strength back.   Current Activity/ Functional Status: Patient is independent with mobility/ambulation, transfers, ADL's, IADL's. Zubrod Score: At the time of surgery this patient's most appropriate activity status/level should be described as: []  Normal activity, no symptoms [x]  Symptoms, fully ambulatory []  Symptoms, in bed less than or equal to 50% of the time []  Symptoms, in bed greater than 50% of the time but less than 100% []  Bedridden []  Moribund  Past Medical History  Diagnosis Date  . Hypertension   . Hypercholesterolemia     statin intolerant  . Diabetes mellitus, type 2   . Benign prostatic hypertrophy   . Emphysema   . Lung cancer     Followed by Dr. [name] (LUL squamous cell carcinoma)  . COPD (chronic obstructive pulmonary disease)   . CKD (chronic kidney disease)   . Colitis   . Coronary artery disease     Cath 07/02/12 3v CAD w/ chronic occlusion of RCA (fills from left collaterals), severe stenosis prox LCx & moderately severe dz in mid LAD; s/p PTCA/BMS to prox LCx 07/12/12   . Normocytic anemia   . Persistent atrial fibrillation   . Atrial  enlargement, left   . Hx of radiation therapy 08/23/12- 09/07/12    left upper chest region    Past Surgical History  Procedure Laterality Date  . Hemorrhoid surgery    . Tee without cardioversion N/A 07/29/2012    Procedure: TRANSESOPHAGEAL ECHOCARDIOGRAM (TEE);  Surgeon: Laurey Morale, MD;  Location: [facility] ENDOSCOPY;  Service: Cardiovascular;  Laterality: N/A;  patient coming from [facility] room 1236 talk to jennifer at [facility]  talk to [facility] from care link will pick up pat. at 8:15 from [place]  . Cardioversion N/A 07/29/2012    Procedure: CARDIOVERSION;  Surgeon: Laurey Morale, MD;  Location: [facility] ENDOSCOPY;  Service: Cardiovascular;  Laterality: N/A;  . Cardiac catheterization    . Coronary angioplasty with stent placement    . Lung biopsy      Family History  Problem Relation Age of Onset  . Stroke Mother   . Heart disease Father     died of heart attack  . Lung cancer Brother   . Hypertension Brother   . Cancer Brother     brain cancer  . Hypothyroidism Brother   . Hyperlipidemia Sister   . Hyperlipidemia Sister     History   Social History  .  Marital Status: Married    Spouse Name: N/A    Number of Children: 1  . Years of Education: N/A   Occupational History  . retired [employer] for over 20 years  . retired     Personnel officer for 10 years   Social History Main Topics  . Smoking status: Former Smoker -- 0.5 packs/day for 50 years    Types: Cigarettes    Start date: 06/09/1958    Quit date: 06/10/2007  . Smokeless tobacco: Never Used  . Alcohol Use: No  . Drug Use: No  . Sexually Active: Not on file      History  Smoking status  . Former Smoker -- 0.50 packs/day for 50 years  . Types: Cigarettes  . Start date: 06/09/1958  . Quit date: 06/10/2007  Smokeless tobacco  . Never Used    History  Alcohol Use No     No Known Allergies  Current Outpatient Prescriptions  Medication Sig Dispense Refill  . aspirin EC 81  MG tablet Take 81 mg by mouth every other day.      . budesonide-formoterol (SYMBICORT) 160-4.5 MCG/ACT inhaler Inhale 2 puffs into the lungs 2 (two) times daily.      . digoxin (LANOXIN) 0.125 MG tablet Take 1 tablet (0.125 mg total) by mouth daily.  30 tablet  0  . diltiazem (CARDIZEM) 30 MG tablet Take 1 tablet (30 mg total) by mouth 2 (two) times daily at 10 AM and 5 PM.      . finasteride (PROSCAR) 5 MG tablet Take 5 mg by mouth every morning.       Marland Kitchen HYDROcodone-acetaminophen (NORCO/VICODIN) 5-325 MG per tablet       . insulin detemir (LEVEMIR) 100 UNIT/ML injection Inject 0.1 mLs (10 Units total) into the skin at bedtime.  10 mL  0  . lidocaine (XYLOCAINE) 2 % solution Take 20 mLs by mouth every 3 (three) hours as needed.  100 mL    . loperamide (IMODIUM A-D) 2 MG tablet Take 2 mg by mouth 4 (four) times daily as needed for diarrhea or loose stools.      . metoprolol tartrate (LOPRESSOR) 25 MG tablet Take 1 tablet (25 mg total) by mouth 2 (two) times daily.  60 tablet  0  . Multiple Vitamin (MULTIVITAMIN WITH MINERALS) TABS Take 1 tablet by mouth daily.  30 tablet  0  . potassium chloride (K-DUR) 10 MEQ tablet       . Tamsulosin HCl (FLOMAX) 0.4 MG CAPS Take 0.4 mg by mouth every morning.       . warfarin (COUMADIN) 2 MG tablet Take 1 tablet (2 mg total) by mouth daily at 6 PM.  30 tablet  0   No current facility-administered medications for this visit.       Review of Systems:     Cardiac Review of Systems: Y or N  Chest Pain [ vague not sure    ]  Resting SOB [ n  ] Exertional SOB  Cove.Etienne  ]  Orthopnea [ n ]   Pedal Edema [ y  ]    Palpitations Cove.Etienne  ] Syncope  [ n ]   Presyncope [ n  ]  General Review of Systems: [Y] = yes [  ]=no Constitional: recent weight change Milo.Brash  ]; anorexia [ n ]; fatigue Cove.Etienne  ]; nausea [n  ]; night sweats [n  ]; fever [ n ]; or chills [  n ];                                                                                                                                           Dental: poor dentition[ dentures ];   Eye : blurred vision [  ]; diplopia [   ]; vision changes [  ];  Amaurosis fugax[  ]; Resp: cough [ y ];  wheezing[y  ];  hemoptysis[n  ]; shortness of breath[y  ]; paroxysmal nocturnal dyspnea[  ]; dyspnea on exertion[ y ]; or orthopnea[  ];  GI:  gallstones[  ], vomiting[  ];  dysphagia[  ]; melena[  ];  hematochezia [  ]; heartburn[  ];   Hx of  Colonoscopy[y  ]; loose stools have improved GU: kidney stones [  ]; hematuria[  ];   dysuria [n  ];  nocturia[  ];  history of     obstruction [  ];             Skin: rash, swelling[  ];, hair loss[  ];  peripheral edema[  ];  or itching[  ]; Musculosketetal: myalgias[  ];  joint swelling[  ];  joint erythema[  ];  joint pain[  ];  back pain[  ];  Heme/Lymph: bruising[  ];  bleeding[  ];  anemia[  ];  Neuro: TIA[  ];  headaches[  ];  stroke[  ];  vertigo[  ];  seizures[  ];   paresthesias[  ];  difficulty walking[  ];  Psych:depression[  ]; anxiety[  ];  Endocrine: diabetes[ y ];  thyroid dysfunction[  ];  Immunizations: Flu [ y ]; Pneumococcal[y  ];  Other:  Physical Exam: There were no vitals taken for this visit.  General appearance: alert, cooperative, appears stated age and no distress Neurologic: intact Heart: regular rate and rhythm, S1, S2 normal, no murmur, click, rub or gallop and normal apical impulse Lungs: diminished breath sounds bibasilar Abdomen: soft, non-tender; bowel sounds normal; no masses,  no organomegaly Extremities: edema 2+ pedal edema in both ankles, patient notes its worst and in the afternoons Do not appreciate any cervical or supraclavicular adenopathy he has no axillary adenopathy Patient appears to be in sinus rhythm today.  Diagnostic Studies & Laboratory data:     Recent Radiology Findings:   Recent Lab Findings:  Dg Chest 2 View  10/28/2012   *RADIOLOGY REPORT*  Clinical Data: History of squamous cell carcinoma of the left upper lobe  CHEST - 2 VIEW   Comparison: Chest x-ray of 09/14/2012  Findings: The lungs appear much better aerated with resolution of many of the interstitial lung markings described previously, possibly previously representing interstitial edema.  The left upper lobe mass remains anteriorly consistent with primary lung carcinoma.  No effusion is seen.  Cardiomegaly is stable.  No bony abnormality is seen.  IMPRESSION:  1.  Significant improvement in diffuse interstitial lung markings most consistent with improvement in interstitial edema. 2.  Stable left upper lobe anterior lung mass consistent with primary lung carcinoma. 3.  Cardiomegaly.   Original Report Authenticated By: Dwyane Dee, M.D.        Lab Results  Component Value Date   WBC 9.0 09/17/2012   Cardiac Stress Test: Impression  Exercise Capacity: Lexiscan with no exercise.  BP Response: Normal blood pressure response.  Clinical Symptoms: No significant symptoms noted.  ECG Impression: No significant ST segment change suggestive of ischemia.  Comparison with Prior Nuclear Study: No previous nuclear study performed  Overall Impression: Intermediate stress nuclear study. Large lateral and inferolateral wall infarct with mild peri infarct ischemia  LV Ejection Fraction: 45%. LV Wall Motion: Inferior and lateral wall hypokinesis  Charlton Haws   PFT's  FEV1 2.47 79 %, DLCO 9.69 29%     Cardiac cath done 07/02/2012. See report : Hemodynamic Findings:  Central aortic pressure: 134/49  Left ventricular pressure: 134/15/23  Angiographic Findings:  Left main: Distal 20% stenosis.  Left Anterior Descending Artery: Large caliber vessel that courses to the apex. The proximal vessel has diffuse plaque disease. The mid vessel has a 60% stenosis just before the diagonal branch. The first diagonal branch is small in caliber and has diffuse disease. The second diagonal is small in caliber and has 99% stenosis.  Circumflex Artery: Large caliber vessel with 99% proximal  stenosis. The distal AV groove Circumflex has diffuse 40% stenosis.  Right Coronary Artery: Large dominant vessel with 80% diffuse stenosis proximal vessel. The mid vessel has a 100% stenosis. The distal vessel fills from left to right collaterals.  Left Ventricular Angiogram: LVEF=45%. Inferior wall hypokinesis.  Impression:  1. 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  2. Segmental LV systolic dysfunction  Recommendations: Complex situation in patient with lung cancer and new diagnosis of triple vessel CAD. His RCA is chronically occluded. The proximal Circumflex has a severe stenosis which could be treated percutaneously however this will need to be discussed with Dr. [name] and Dr. Eden Emms in regards to planning the timing with lobectomy. Will discuss possibility of CABG versus stenting of the Circumflex with a bare metal stent which would require one month of dual anti-platelet therapy. He will go home today and further planning will be done as an outpatient.  Complications: None. The patient tolerated the procedure well.   Patient underwent placement of a bare-metal stent in the circumflex.  Assessment / Plan:      1 new diagnosis of stage clinical stage IB squamous cell carcinoma of the left upper lobe, now treated with stereotactic radiotherapy. 2 Advanced emphysema with severe diffusion defect, has not been smoking for 4 years 3 History of diabetes 4 recent admission for atrial fibrillation and diffuse colitis  I'll plan to see the patient back in 2 months after a followup CT scan of the chest arranged by Dr. Roselind Messier.   Delight Ovens MD  Beeper [phone] Office [phone] 10/28/2012 9:23 AM

## 2012-11-03 ENCOUNTER — Encounter: Payer: Self-pay | Admitting: Internal Medicine

## 2012-11-03 ENCOUNTER — Ambulatory Visit (INDEPENDENT_AMBULATORY_CARE_PROVIDER_SITE_OTHER): Payer: Medicare Other | Admitting: Internal Medicine

## 2012-11-03 VITALS — BP 124/82 | HR 94 | Temp 98.2°F | Ht 70.0 in | Wt 162.0 lb

## 2012-11-03 DIAGNOSIS — J449 Chronic obstructive pulmonary disease, unspecified: Secondary | ICD-10-CM

## 2012-11-03 DIAGNOSIS — J438 Other emphysema: Secondary | ICD-10-CM

## 2012-11-03 DIAGNOSIS — J439 Emphysema, unspecified: Secondary | ICD-10-CM

## 2012-11-03 LAB — PULMONARY FUNCTION TEST
FEF 25-75 Post: 2.92 L/sec
FEF2575-%Pred-Pre: 102 %
FEV1-%Change-Post: 4 %
FEV1-%Pred-Pre: 88 %
FEV1-Post: 2.81 L
FEV1FVC-%Change-Post: 2 %
FEV6-%Change-Post: 2 %
FEV6-%Pred-Pre: 87 %
FEV6FVC-%Change-Post: 0 %
FEV6FVC-%Pred-Post: 106 %
FVC-%Change-Post: 1 %
FVC-%Pred-Pre: 82 %
Pre FEV1/FVC ratio: 77 %

## 2012-11-03 NOTE — Assessment & Plan Note (Signed)
He clearly has emphysema of the lung based on CT scan of the chest but he appears relatively asymptomatic and he does not desaturate with exertion. He was deconditioned but now has regained his conditioning. He is able to do his activities of daily living which is minimal anyway but without any inhalers. He does not see the need to followup in pulmonary and have agreed with him and his family on this unless there are records COPD exacerbations or he is having symptoms. I've advised him to take albuterol as needed if and when he needs it  In terms of the lung mass have asked his daughter to sort out who will be responsible to follow this up with annual surveillance CT scans after his radiation is complete. They think they will followup with thoracic surgery for this    > 50% of this > 25 min visit spent in face to face counseling (15 min visit converted to 25 min)

## 2012-11-03 NOTE — Progress Notes (Signed)
Subjective:    Patient ID: Brandon Clay, male    DOB: [birth year], 78 y.o.   MRN: [id number]  HPI 78 year old male. Patient was last seen in February 2014 a month ago in the intensive care unit for Clostridium difficile diarrhea and hypotension. He now presents for COPD followup. In the interim, a decision was made not to operate on his left upper lobe lung cancer and he is on currently curative radiation treatment. Of note, he has coronary artery disease status post stenting because he was too high risk for surgery. He is being followed at [facility] cardiology. And CT scan of the chest does show smoking related emphysema February 2014  Office visit 09/01/2012 is for COPD.    At this point he is denying any major shortness of breath or cough. In fact on his COPD cat score dyspnea and cough are not prominent symptoms but instead fatigue and deconditioning are. It looks like since his admission in mid February he improved but over a week ago he fell presumably due to orthostasis and since then is having orthostatic dizziness. Her blood pressure checked a few days ago atd radiation oncology revealed orthostatic hypotension. Today in our office as well his standing systolic was 80 while sitting systolic was  126.Marland Kitchen Tthe family is most concerned about his orthostasis . Of note, he is on Lopressor at a reduced dose but despite the reduction in dose he continues to orthostasis. They deny being on other bp med though cardizem is noted on med list. They have stopped his lasix as well. Blood test yesterday in oncology revealed high white count and he's been given erythromycin though he is denying respiratory symptoms.He feels this issue is beyond complexity of his PMD   He continues his Symbicort   Recent Labs Lab 08/25/12 1520 08/31/12 1305  HGB 8.6* 8.9*  HCT 26.7* 27.6*  WBC 13.1* 21.4*  PLT 171 252    Recent Labs Lab 08/25/12 1520 08/31/12 1305  NA 137 137  K 3.4* 3.9  CL 105 105  CO2 20*  21*  GLUCOSE 124* 103*  BUN 23.5 22.5  CREATININE 1.5* 1.1  CALCIUM 9.0 8.2*     #COPD  This appears stable continue Symbicort  At followup we will do pulmonary function test  #Orthostatic dizziness and fatigue and high white count  - Stop the Lopressor - I discussed this with Dr. Peter Swaziland of cardiology  - Repeat lab tests including magnesium and phosphorus in the the lab today; will call you with the results  - Monitor blood pressure supine and standing at home for the next few to several days if the blood pressure drops in the standing position please let us know  #Followup  2 months for COPD with pulmonary function test   OV 11/03/2012 After last visit he was quickly hospitalized with issues of severe Clostridium difficile diarrhea recurrence along with cellulitis. He was in the intensive care unit. After several weeks in the hospital and developing anasarca he was discharged to a skilled nursing facility rehabilitation where he spent 3 weeks and discharged home 4 weeks ago. He is progressively gained health. He's been diuresed of 30 pounds and currently weighs around 150 pounds. He is functional does activities of daily living around the house with a cane and does and has attempted driving the car a little bit. He denies any dyspnea in fact to stop her Symbicort and he feels well. There is no cough. COPD cat score is significantly improved  to 5 reflecting very minimal symptoms burden. Of note, sometime along in rehabilitation his oxygen was discontinued because he was doing well. Currently finishing up home physical therapy as well    Pulmonary function test today 10/26/2012 shows normal spirometry. Postoperative spirometry is FEC 3.5 L/82%. FEV1 2.8 L/92%. Ratio is 79/110%. Total lung capacity is reduced at 61%. DLCO is significant reduced at 29%. Is normal spirometry with restriction and significant reduction in diffusion capacity.  Walk yesterday on room 185 feet x3 laps at is low  normal pace he did not desaturate below 91%. This is despite his low diffusion capacity  CAT COPD Symptom & Quality of Life Score ([employer] trademark) 0 is no burden. 5 is highest burden 09/01/2012  11/03/2012   Never Cough -> Cough all the time 2 1  No phlegm in chest -> Chest is full of phlegm 2 0  No chest tightness -> Chest feels very tight 0 0  No dyspnea for 1 flight stairs/hill -> Very dyspneic for 1 flight of stairs 4 1  No limitations for ADL at home -> Very limited with ADL at home 5 1  Confident leaving home -> Not at all confident leaving home 1 0  Sleep soundly -> Do not sleep soundly because of lung condition 1 0  Lots of Energy -> No energy at all 5 2  TOTAL Score (max 40)  20 5   Past, Family, Social reviewed: no change since last visit    Review of Systems  Constitutional: Negative for fever and unexpected weight change.  HENT: Negative for ear pain, nosebleeds, congestion, sore throat, rhinorrhea, sneezing, trouble swallowing, dental problem, postnasal drip and sinus pressure.   Eyes: Negative for redness and itching.  Respiratory: Negative for cough, chest tightness, shortness of breath and wheezing.   Cardiovascular: Negative for palpitations and leg swelling.  Gastrointestinal: Negative for nausea and vomiting.  Genitourinary: Negative for dysuria.  Musculoskeletal: Negative for joint swelling.  Skin: Negative for rash.  Neurological: Negative for headaches.  Hematological: Does not bruise/bleed easily.  Psychiatric/Behavioral: Negative for dysphoric mood. The patient is not nervous/anxious.        Objective:   Physical Exam Nursing note and vitals reviewed. Constitutional: He is oriented to person, place, and time. He appears well-developed and well-nourished. No distress.   this time he sitting in a chair with a cane looking much better HENT:  Head: Normocephalic and atraumatic.  Right Ear: External ear normal.  Left Ear: External ear normal.  Mouth/Throat:  Oropharynx is clear and moist. No oropharyngeal exudate.  Eyes: Conjunctivae and EOM are normal. Pupils are equal, round, and reactive to light. Right eye exhibits no discharge. Left eye exhibits no discharge. No scleral icterus.  Neck: Normal range of motion. Neck supple. No JVD present. No tracheal deviation present. No thyromegaly present.  Cardiovascular: Normal rate, regular rhythm and intact distal pulses.  Exam reveals no gallop and no friction rub.   No murmur heard. Pulmonary/Chest: Effort normal and breath sounds normal. No respiratory distress. He has no wheezes. He has no rales. He exhibits no tenderness.  Abdominal: Soft. Bowel sounds are normal. He exhibits no distension and no mass. There is no tenderness. There is no rebound and no guarding.  Musculoskeletal: Normal range of motion. He exhibits no edema and no tenderness.  Lymphadenopathy:    He has no cervical adenopathy.  Neurological: He is alert and oriented to person, place, and time. He has normal reflexes. No cranial nerve deficit. Coordination  normal.  Skin: Skin is warm and dry. No rash noted. He is not diaphoretic. No erythema. No pallor.  Psychiatric: Judgment and thought content normal.  Flat affect            Assessment & Plan:

## 2012-11-03 NOTE — Patient Instructions (Addendum)
#   emphysema - You have emphysema and is scan from prior smoking but at this point in time this is not affecting you - Continue daily exercises - Use albuterol as needed - No further followup in pulmonary because you're doing well but please come back if there are problems  #Lung cancer - Please sort out with thoracic surgery and radiation specialist who will follow you after his radiation  treatment is complete -

## 2012-11-03 NOTE — Progress Notes (Signed)
PFT done today. 

## 2012-12-16 ENCOUNTER — Ambulatory Visit: Payer: Medicare Other | Admitting: Radiation Oncology

## 2012-12-17 ENCOUNTER — Telehealth: Payer: Self-pay | Admitting: *Deleted

## 2012-12-17 NOTE — Telephone Encounter (Signed)
Received call from pt's daughter, [name] who is asking when pt's ct chest scan and FU w/Dr Roselind Messier is scheduled. Informed her that Dr Roselind Messier is out of office today, will be informed of request for order when he returns to office on 12/20/12. Also advised her that when order is placed by dr, Jacolyn Reedy, medical secretary will schedule scan and FU then inform her of both appointments.  Informed her this message will be routed to [facility] and Dr Roselind Messier. Darlene verbalized understanding. Her # [phone].

## 2012-12-21 ENCOUNTER — Other Ambulatory Visit: Payer: Self-pay | Admitting: Radiation Oncology

## 2012-12-21 ENCOUNTER — Telehealth: Payer: Self-pay | Admitting: *Deleted

## 2012-12-21 NOTE — Telephone Encounter (Signed)
Darlene, daughter of Mr. Tayden Nichelson called requesting to speak with Talbert Forest about patients CT scan appt, she wasked that she be called instead of her parents, her number is [phone], informed her I would e-mail and put not on shirleys desk, she is out of office at a meeting,  12:36 PM

## 2012-12-22 ENCOUNTER — Telehealth: Payer: Self-pay | Admitting: *Deleted

## 2012-12-22 NOTE — Telephone Encounter (Signed)
Called patient's daughter Beckie Salts to notify that the test on her dad has been cancelled due to insurance not approving , it has been rescheduled for 12-24-12 at 3 pm at [facility], patient's daughter is good with this, if the test would need to be moved out to Monday because of insurance coverage the daughter stated that it would be o.k.

## 2012-12-22 NOTE — Telephone Encounter (Signed)
Called patient's daughter Beckie Salts to inform of CT on 12-23-12 at 2:00 pm, and STAT Labs prior to CT at 1:00 pm on 12/07/12, and fu visit on 12-28-12 at 2:00 pm with Dr. Roselind Messier, patient's daughter  agreed to dates of lab, test and fu visit.

## 2012-12-22 NOTE — Telephone Encounter (Signed)
XXXX 

## 2012-12-23 ENCOUNTER — Telehealth: Payer: Self-pay | Admitting: *Deleted

## 2012-12-23 ENCOUNTER — Ambulatory Visit (HOSPITAL_COMMUNITY): Payer: Medicare Other

## 2012-12-23 ENCOUNTER — Ambulatory Visit: Payer: Medicare Other

## 2012-12-23 NOTE — Telephone Encounter (Signed)
Phoned this patient's daughter to let her know that her dad's scan has been approved by the insurance, scan will be tomorrow at [facility] at 3 pm, patient's daughter is aware of this

## 2012-12-24 ENCOUNTER — Ambulatory Visit
Admission: RE | Admit: 2012-12-24 | Discharge: 2012-12-24 | Disposition: A | Discharge status: Home / Self Care | Payer: Medicare Other | Source: Ambulatory Visit | Attending: Radiation Oncology | Admitting: Radiation Oncology

## 2012-12-24 ENCOUNTER — Encounter (HOSPITAL_COMMUNITY): Payer: Self-pay

## 2012-12-24 ENCOUNTER — Ambulatory Visit (HOSPITAL_COMMUNITY)
Admission: RE | Admit: 2012-12-24 | Discharge: 2012-12-24 | Disposition: A | Discharge status: Home / Self Care | Payer: Medicare Other | Source: Ambulatory Visit | Attending: Radiation Oncology | Admitting: Radiation Oncology

## 2012-12-24 DIAGNOSIS — K8020 Calculus of gallbladder without cholecystitis without obstruction: Secondary | ICD-10-CM | POA: Insufficient documentation

## 2012-12-24 DIAGNOSIS — I700 Atherosclerosis of aorta: Secondary | ICD-10-CM | POA: Insufficient documentation

## 2012-12-24 DIAGNOSIS — C3410 Malignant neoplasm of upper lobe, unspecified bronchus or lung: Secondary | ICD-10-CM | POA: Insufficient documentation

## 2012-12-24 DIAGNOSIS — R599 Enlarged lymph nodes, unspecified: Secondary | ICD-10-CM | POA: Insufficient documentation

## 2012-12-24 DIAGNOSIS — Z923 Personal history of irradiation: Secondary | ICD-10-CM | POA: Insufficient documentation

## 2012-12-24 DIAGNOSIS — R911 Solitary pulmonary nodule: Secondary | ICD-10-CM | POA: Insufficient documentation

## 2012-12-24 DIAGNOSIS — I2510 Atherosclerotic heart disease of native coronary artery without angina pectoris: Secondary | ICD-10-CM | POA: Insufficient documentation

## 2012-12-24 DIAGNOSIS — J90 Pleural effusion, not elsewhere classified: Secondary | ICD-10-CM | POA: Insufficient documentation

## 2012-12-24 MED ORDER — IOHEXOL 300 MG/ML  SOLN
80.0000 mL | Freq: Once | INTRAMUSCULAR | Status: AC | PRN
  Administered 2012-12-24: 80 mL via INTRAVENOUS

## 2012-12-27 ENCOUNTER — Ambulatory Visit: Payer: Medicare Other | Admitting: Radiation Oncology

## 2012-12-28 ENCOUNTER — Ambulatory Visit: Payer: Medicare Other | Admitting: Radiation Oncology

## 2012-12-30 ENCOUNTER — Ambulatory Visit: Payer: Medicare Other | Admitting: Cardiothoracic Surgery

## 2013-01-04 ENCOUNTER — Ambulatory Visit: Payer: Medicare Other | Admitting: Cardiothoracic Surgery

## 2013-01-06 ENCOUNTER — Ambulatory Visit: Payer: Medicare Other | Admitting: Cardiothoracic Surgery

## 2013-02-10 ENCOUNTER — Ambulatory Visit: Payer: Medicare Other | Admitting: Cardiothoracic Surgery

## 2013-02-21 ENCOUNTER — Telehealth: Payer: Self-pay | Admitting: Oncology

## 2013-02-21 NOTE — Telephone Encounter (Addendum)
Brandon Clay's daughter, Brandon Clay, called and said that her father has been having pain and swelling in his left side since Friday of last week.  He has not had any shortness of breath.  He has been putting Aspercreme on it which does help.  Brandon Clay says that she thinks he may be doing too much.  He has been fixing lawnmowers on a lift.  I have put in a call to try to talk to Brandon Clay himself to see if he has any other symptoms.  Also advised Brandon Clay to call Dr. Wilford Sports office to see if he needs to be seen.   Late entry:  Spoke to Brandon Clay and he said he thinks he pulled a muscle working on his lawnmowers.  He denies shortness of breath and chest pain.  He said his home care nurse had looked at it and thinks he pulled a muscle.  He said the aspercream did help.  He said that his wife thought she felt a lump where he is having the pain.  Advised him to rest and to take ibuprofen as needs.  He said he will call back if the pain gets worse or does not go away.

## 2013-03-03 ENCOUNTER — Ambulatory Visit
Admission: RE | Admit: 2013-03-03 | Discharge: 2013-03-03 | Disposition: A | Discharge status: Home / Self Care | Payer: Medicare Other | Source: Ambulatory Visit | Attending: Cardiothoracic Surgery | Admitting: Cardiothoracic Surgery

## 2013-03-03 ENCOUNTER — Ambulatory Visit (INDEPENDENT_AMBULATORY_CARE_PROVIDER_SITE_OTHER): Payer: Medicare Other | Admitting: Cardiothoracic Surgery

## 2013-03-03 ENCOUNTER — Other Ambulatory Visit: Payer: Self-pay

## 2013-03-03 ENCOUNTER — Encounter: Payer: Self-pay | Admitting: Cardiothoracic Surgery

## 2013-03-03 VITALS — BP 132/87 | HR 80 | Resp 19 | Ht 70.0 in | Wt 163.0 lb

## 2013-03-03 DIAGNOSIS — R911 Solitary pulmonary nodule: Secondary | ICD-10-CM

## 2013-03-03 DIAGNOSIS — D381 Neoplasm of uncertain behavior of trachea, bronchus and lung: Secondary | ICD-10-CM

## 2013-03-03 NOTE — Progress Notes (Signed)
[address]       [place] [address]             [phone]       Brandon Clay [facility] Medical Record #[id number] Date of Birth: 01/31/1935  Referring: Dr  Abran Cantor Primary Care: [name], MD  Chief Complaint:    Chief Complaint  Patient presents with  . Follow-up    3 month f/u to review CT scan    History of Present Illness:    Patient returns now after several months of a complex medical treatment including at least 2 episodes of prolonged stays in a nursing home. He has undergone stereotactic radiotherapy for the left upper lobe lung nodule. During this period C. difficile again flared up and he required hospitalization. Functionally he is now much improved, back in home ambulating well and not on oxygen any longer. He has lost a significant amount of weight but is slowly gaining his strength back. The patient does note soreness in constant pain left upper chest. Was made worse by some lifting and work around the house several weeks ago.  Diagnosis: Squamous cell lung cancer, left. Clinical Stage I-B  Radiation treatment dates: 08/23/2012 through 09/07/2012  Site/dose: Left upper chest region 60 Gy in 5 fractions (12 Gy per fraction)  Beams/energy: SBRT techniques   Current Activity/ Functional Status: Patient is independent with mobility/ambulation, transfers, ADL's, IADL's. Zubrod Score: At the time of surgery this patient's most appropriate activity status/level should be described as: []  Normal activity, no symptoms [x]  Symptoms, fully ambulatory []  Symptoms, in bed less than or equal to 50% of the time []  Symptoms, in bed greater than 50% of the time but less than 100% []  Bedridden []  Moribund  Past Medical History  Diagnosis Date  . Hypertension   . Hypercholesterolemia     statin intolerant  . Diabetes mellitus, type 2   . Benign prostatic hypertrophy   . Emphysema   . COPD (chronic  obstructive pulmonary disease)   . CKD (chronic kidney disease)   . Colitis   . Coronary artery disease     Cath 07/02/12 3v CAD w/ chronic occlusion of RCA (fills from left collaterals), severe stenosis prox LCx & moderately severe dz in mid LAD; s/p PTCA/BMS to prox LCx 07/12/12   . Normocytic anemia   . Persistent atrial fibrillation   . Atrial enlargement, left   . Hx of radiation therapy 08/23/12- 09/07/12    left upper chest region  . Lung cancer     Followed by Dr. [name] (LUL squamous cell carcinoma)    Past Surgical History  Procedure Laterality Date  . Hemorrhoid surgery    . Tee without cardioversion N/A 07/29/2012    Procedure: TRANSESOPHAGEAL ECHOCARDIOGRAM (TEE);  Surgeon: Laurey Morale, MD;  Location: [facility] ENDOSCOPY;  Service: Cardiovascular;  Laterality: N/A;  patient coming from [place] room 1236 talk to jennifer at [place]  talk to [facility] from care link will pick up pat. at 8:15 from [place]  . Cardioversion N/A 07/29/2012    Procedure: CARDIOVERSION;  Surgeon: Laurey Morale, MD;  Location: [facility] ENDOSCOPY;  Service: Cardiovascular;  Laterality: N/A;  . Cardiac catheterization    . Coronary angioplasty with stent placement    . Lung biopsy      Family History  Problem Relation Age of Onset  .  Stroke Mother   . Heart disease Father     died of heart attack  . Lung cancer Brother   . Hypertension Brother   . Cancer Brother     brain cancer  . Hypothyroidism Brother   . Hyperlipidemia Sister   . Hyperlipidemia Sister     History   Social History  . Marital Status: Married    Spouse Name: N/A    Number of Children: 1  . Years of Education: N/A   Occupational History  . retired [employer] for over 20 years  . retired     Personnel officer for 10 years   Social History Main Topics  . Smoking status: Former Smoker -- 0.5 packs/day for 50 years    Types: Cigarettes    Start date: 06/09/1958    Quit date: 06/10/2007  .  Smokeless tobacco: Never Used  . Alcohol Use: No  . Drug Use: No  . Sexually Active: Not on file      History  Smoking status  . Former Smoker -- 0.50 packs/day for 50 years  . Types: Cigarettes  . Start date: 06/09/1958  . Quit date: 06/10/2007  Smokeless tobacco  . Never Used    History  Alcohol Use No     No Known Allergies  Current Outpatient Prescriptions  Medication Sig Dispense Refill  . aspirin EC 81 MG tablet Take 81 mg by mouth every other day.      . budesonide-formoterol (SYMBICORT) 160-4.5 MCG/ACT inhaler Inhale 2 puffs into the lungs 2 (two) times daily.      . cefdinir (OMNICEF) 300 MG capsule       . cholestyramine (QUESTRAN) 4 G packet       . digoxin (LANOXIN) 0.125 MG tablet Take 1 tablet (0.125 mg total) by mouth daily.  30 tablet  0  . diltiazem (CARDIZEM) 30 MG tablet Take 1 tablet (30 mg total) by mouth 2 (two) times daily at 10 AM and 5 PM.      . finasteride (PROSCAR) 5 MG tablet Take 5 mg by mouth every morning.       . furosemide (LASIX) 20 MG tablet Take 20 mg by mouth 2 (two) times daily.       . insulin detemir (LEVEMIR) 100 UNIT/ML injection Inject 0.1 mLs (10 Units total) into the skin at bedtime.  10 mL  0  . metoprolol tartrate (LOPRESSOR) 25 MG tablet Take 1 tablet (25 mg total) by mouth 2 (two) times daily.  60 tablet  0  . Multiple Vitamin (MULTIVITAMIN WITH MINERALS) TABS Take 1 tablet by mouth daily.  30 tablet  0  . NOVOLOG FLEXPEN 100 UNIT/ML SOPN FlexPen       . potassium chloride SA (K-DUR,KLOR-CON) 20 MEQ tablet Take 20 mEq by mouth daily.       . Tamsulosin HCl (FLOMAX) 0.4 MG CAPS Take 0.4 mg by mouth every morning.       . warfarin (COUMADIN) 10 MG tablet Take 10 mg by mouth daily.       No current facility-administered medications for this visit.       Review of Systems:     Cardiac Review of Systems: Y or N  Chest Pain [ vague not sure    ]  Resting SOB [ n  ] Exertional SOB  Cove.Etienne  ]  Orthopnea [ n ]   Pedal Edema [ y   ]    Palpitations [  y  ] Syncope  [ n ]   Presyncope [ n  ]  General Review of Systems: [Y] = yes [  ]=no Constitional: recent weight change [n  ]; anorexia [ n ]; fatigue Cove.Etienne  ]; nausea [n  ]; night sweats [n  ]; fever [ n ]; or chills [ n ];                                                                                                                                          Dental: poor dentition[ dentures ];   Eye : blurred vision [  ]; diplopia [   ]; vision changes [  ];  Amaurosis fugax[  ]; Resp: cough [ y ];  wheezing[y  ];  hemoptysis[n  ]; shortness of breath[y  ]; paroxysmal nocturnal dyspnea[  ]; dyspnea on exertion[ y ]; or orthopnea[  ];  GI:  gallstones[  ], vomiting[  ];  dysphagia[  ]; melena[  ];  hematochezia [  ]; heartburn[  ];   Hx of  Colonoscopy[y  ]; loose stools have improved GU: kidney stones [  ]; hematuria[  ];   dysuria [n  ];  nocturia[  ];  history of     obstruction [  ];             Skin: rash, swelling[  ];, hair loss[  ];  peripheral edema[  ];  or itching[  ]; Musculosketetal: myalgias[  ];  joint swelling[  ];  joint erythema[  ];  joint pain[  ];  back pain[  ];  Heme/Lymph: bruising[  ];  bleeding[  ];  anemia[  ];  Neuro: TIA[  ];  headaches[  ];  stroke[  ];  vertigo[  ];  seizures[  ];   paresthesias[  ];  difficulty walking[  ];  Psych:depression[  ]; anxiety[  ];  Endocrine: diabetes[ y ];  thyroid dysfunction[  ];  Immunizations: Flu [ y ]; Pneumococcal[y  ];  Other:  Physical Exam: BP 132/87  Pulse 80  Resp 19  Ht 5\' 10"  (1.778 m)  Wt 163 lb (73.936 kg)  BMI 23.39 kg/m2  SpO2 93%  General appearance: alert, cooperative, appears stated age and no distress Neurologic: intact Heart: regular rate and rhythm, S1, S2 normal, no murmur, click, rub or gallop and normal apical impulse Lungs: diminished breath sounds bibasilar Abdomen: soft, non-tender; bowel sounds normal; no masses,  no organomegaly Extremities: edema 2+ pedal edema in both  ankles, patient notes its worst and in the afternoons Do not appreciate any cervical or supraclavicular adenopathy he has no axillary adenopathy Patient appears to be in sinus rhythm today.  Diagnostic Studies & Laboratory data:     Recent Radiology Findings:  Dg Chest 2 View  03/03/2013   CLINICAL DATA:  Lung cancer. Shortness of Breath.  EXAM: CHEST  2  VIEW  COMPARISON:  12/10/2012.  FINDINGS: Stable left upper lobe scarring changes likely related to radiation. Some of this is likely a persistent left upper quadrant mass. Stable severe underlying emphysematous changes and pulmonary scarring. No definite new/ acute pulmonary process. No pleural effusion. The bony thorax is intact.  IMPRESSION: Stable left upper lobe lung lesion and radiation/scarring. No new/ progressive findings.  Stable severe chronic emphysematous changes.   Electronically Signed   By: Loralie Champagne M.D.   On: 03/03/2013 14:14    12/24/2012 RADIOLOGY REPORT*  Clinical Data: Stage I non-small cell lung cancer, status post SBRT  CT CHEST WITH CONTRAST  Technique: Multidetector CT imaging of the chest was performed  following the standard protocol during bolus administration of  intravenous contrast.  Contrast: 80mL OMNIPAQUE IOHEXOL 300 MG/ML SOLN  Comparison: CTA chest dated 11/05/2012  Findings: 2.6 x 2.6 cm left upper lobe pulmonary nodule which abuts  the anterior pleural surface, corresponding to known non-small cell  lung cancer, previously 3.4 x 2.7 cm.  Associated patchy opacity / nodularity inferiorly (series 5/image  21), possibly reflecting radiation changes.  7 x 11 mm nodule at the right lung base (series 5/image 43), motion  degraded on the prior study, but grossly unchanged.  Extensive paraseptal and centrilobular emphysematous changes.  Small left pleural effusion, increased. No pneumothorax.  Visualized left thyroid is mildly heterogeneous/nodular (series  2/image 12).  Mild cardiomegaly. No  pericardial effusion. Coronary  atherosclerosis. Atherosclerotic calcifications of the aortic  arch.  Mild thoracic lymphadenopathy, more conspicuous than on prior CT  chest but similar to prior PET CT, including:  --11 mm short axis precarinal node (series 2/image 23)  --16 mm short-axis subcarinal node (series 2/image 29)  --9 mm short-axis left hilar node (series 2/image 31)  Visualized upper abdomen is notable for cholelithiasis and vascular  calcifications.  Visualized osseous structures are within normal limits.  IMPRESSION:  2.6 cm left upper lobe pulmonary nodule, corresponding to known non-  small cell lung cancer, previously 3.4 cm. Suspected surrounding  radiation changes.  7 x 11 mm nodule at the right lung base, grossly unchanged.  Mild thoracic lymphadenopathy, as described above.  Small left pleural effusion, increased.  Original Report Authenticated By: Charline Bills, M.D.       Lab Results  Component Value Date   WBC 9.0 09/17/2012   Cardiac Stress Test: Impression  Exercise Capacity: Lexiscan with no exercise.  BP Response: Normal blood pressure response.  Clinical Symptoms: No significant symptoms noted.  ECG Impression: No significant ST segment change suggestive of ischemia.  Comparison with Prior Nuclear Study: No previous nuclear study performed  Overall Impression: Intermediate stress nuclear study. Large lateral and inferolateral wall infarct with mild peri infarct ischemia  LV Ejection Fraction: 45%. LV Wall Motion: Inferior and lateral wall hypokinesis  Charlton Haws   PFT's  FEV1 2.47 79 %, DLCO 9.69 29%     Cardiac cath done 07/02/2012. See report : Hemodynamic Findings:  Central aortic pressure: 134/49  Left ventricular pressure: 134/15/23  Angiographic Findings:  Left main: Distal 20% stenosis.  Left Anterior Descending Artery: Large caliber vessel that courses to the apex. The proximal vessel has diffuse plaque disease. The mid vessel  has a 60% stenosis just before the diagonal branch. The first diagonal branch is small in caliber and has diffuse disease. The second diagonal is small in caliber and has 99% stenosis.  Circumflex Artery: Large caliber vessel with 99% proximal stenosis. The distal AV groove  Circumflex has diffuse 40% stenosis.  Right Coronary Artery: Large dominant vessel with 80% diffuse stenosis proximal vessel. The mid vessel has a 100% stenosis. The distal vessel fills from left to right collaterals.  Left Ventricular Angiogram: LVEF=45%. Inferior wall hypokinesis.  Impression:  1. 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  2. Segmental LV systolic dysfunction  Recommendations: Complex situation in patient with lung cancer and new diagnosis of triple vessel CAD. His RCA is chronically occluded. The proximal Circumflex has a severe stenosis which could be treated percutaneously however this will need to be discussed with Dr. [name] and Dr. Eden Emms in regards to planning the timing with lobectomy. Will discuss possibility of CABG versus stenting of the Circumflex with a bare metal stent which would require one month of dual anti-platelet therapy. He will go home today and further planning will be done as an outpatient.  Complications: None. The patient tolerated the procedure well.   Patient underwent placement of a bare-metal stent in the circumflex.  Assessment / Plan:      1diagnosis of stage clinical stage IB squamous cell carcinoma of the left upper lobe, now treated with stereotactic radiotherapy, with associated left chest wall pain Chest x-ray done today shows no acute bony erosions or fractures, but suspect the pain is related to the radiation therapy to this area. The last CT scan in July showed the mass to have decreased in size. I'll plan to see him back again in approximately 2 months after a followup CT scan arranged by Dr. Roselind Messier 2  Advanced emphysema with severe diffusion defect, has not been smoking for 4 years 3 History of diabetes 4 recurrent admission for atrial fibrillation and diffuse colitis in July 2004 to    Delight Ovens MD  Beeper [phone] Office [phone] 03/03/2013 12:53 PM

## 2013-04-04 ENCOUNTER — Ambulatory Visit: Payer: Medicare Other | Admitting: Radiation Oncology

## 2013-04-14 ENCOUNTER — Ambulatory Visit: Payer: Medicare Other | Admitting: Cardiothoracic Surgery

## 2013-04-14 ENCOUNTER — Ambulatory Visit: Payer: Self-pay | Admitting: Radiation Oncology

## 2013-04-15 ENCOUNTER — Ambulatory Visit: Payer: Self-pay | Admitting: Unknown Physician Specialty

## 2013-05-02 ENCOUNTER — Ambulatory Visit
Admission: RE | Admit: 2013-05-02 | Discharge: 2013-05-02 | Disposition: A | Discharge status: Home / Self Care | Payer: Medicare Other | Source: Ambulatory Visit | Attending: Radiation Oncology | Admitting: Radiation Oncology

## 2013-05-02 ENCOUNTER — Encounter: Payer: Self-pay | Admitting: Radiation Oncology

## 2013-05-02 VITALS — BP 125/68 | HR 82 | Temp 98.1°F | Ht 70.0 in | Wt 162.4 lb

## 2013-05-02 DIAGNOSIS — C3492 Malignant neoplasm of unspecified part of left bronchus or lung: Secondary | ICD-10-CM

## 2013-05-02 NOTE — Progress Notes (Signed)
Brandon Clay is here for an assessment following Radiation Therapy to his left upper chest.  He denies any pain , nor SOB when ambulating.  He admits to his legs being weak and ambulates with a cane.  ~ 2-3 weeks ago he had a Fecal Implant at [facility] to control his diarrhea, and thus far he notes good results.

## 2013-05-02 NOTE — Progress Notes (Signed)
Radiation Oncology         [phone] ________________________________  Name: Brandon Clay MRN: [id number]  Date: 05/02/2013  DOB: 01/04/1935  Follow-Up Visit Note  CC: [name], MD  [name],*  Diagnosis:   Clinical stage IB squamous cell carcinoma of the left lung  Interval Since Last Radiation:  7  months  Narrative:  The patient returns today for routine follow-up.  The patient is doing better at this time. He recently underwent a fecal implant which has finally cleared up his problems with diarrhea. The patient at times will have some discomfort in the left upper chest area but nothing consistent. He denies any significant cough or hemoptysis. He denies any breathing problems. His strength is diminished in light of him being admitted 5 times over the past year.                              ALLERGIES:  has No Known Allergies.  Meds: Current Outpatient Prescriptions  Medication Sig Dispense Refill  . aspirin EC 81 MG tablet Take 81 mg by mouth every other day.      . budesonide-formoterol (SYMBICORT) 160-4.5 MCG/ACT inhaler Inhale 2 puffs into the lungs 2 (two) times daily.      . cholestyramine (QUESTRAN) 4 G packet       . digoxin (LANOXIN) 0.125 MG tablet Take 1 tablet (0.125 mg total) by mouth daily.  30 tablet  0  . furosemide (LASIX) 20 MG tablet Take 20 mg by mouth 2 (two) times daily.       . insulin detemir (LEVEMIR) 100 UNIT/ML injection Inject 5 Units into the skin 2 (two) times daily.      . metoprolol tartrate (LOPRESSOR) 25 MG tablet Take 1 tablet (25 mg total) by mouth 2 (two) times daily.  60 tablet  0  . Multiple Vitamin (MULTIVITAMIN WITH MINERALS) TABS Take 1 tablet by mouth daily.  30 tablet  0  . NOVOLOG FLEXPEN 100 UNIT/ML SOPN FlexPen       . potassium chloride SA (K-DUR,KLOR-CON) 20 MEQ tablet Take 20 mEq by mouth daily.       . Tamsulosin HCl (FLOMAX) 0.4 MG CAPS Take 0.4 mg by mouth every morning.       . warfarin  (COUMADIN) 1 MG tablet Take 9 mg by mouth daily.      Marland Kitchen diltiazem (CARDIZEM) 30 MG tablet Take 240 mg by mouth daily.      Marland Kitchen GAVILYTE-G 236 G solution        No current facility-administered medications for this encounter.    Physical Findings: The patient is in no acute distress. Patient is alert and oriented.  height is 5\' 10"  (1.778 m) and weight is 162 lb 6.4 oz (73.664 kg). His temperature is 98.1 F (36.7 C). His blood pressure is 125/68 and his pulse is 82. Marland Kitchen  No palpable supraclavicular or axillary adenopathy. The lungs are clear to auscultation. The heart has regular rhythm and rate.  Lab Findings: Lab Results  Component Value Date   WBC 9.0 09/17/2012   HGB 8.8* 09/17/2012   HCT 27.1* 09/17/2012   MCV 80.4 09/17/2012   PLT 182 09/17/2012        Radiographic Findings: No results found.  Impression:  The patient is recovering from the effects of radiation.    Plan:  The patient will be scheduled for a CT scan of the chest.  Routine followup in 6 months. In the interim the patient will be seen by thoracic surgery.  _____________________________________ -----------------------------------  [name], PhD, MD

## 2013-05-11 ENCOUNTER — Ambulatory Visit (HOSPITAL_COMMUNITY): Payer: Medicare Other

## 2013-05-11 ENCOUNTER — Ambulatory Visit: Payer: Medicare Other

## 2013-05-12 ENCOUNTER — Ambulatory Visit: Admission: RE | Admit: 2013-05-12 | Payer: Medicare Other | Source: Ambulatory Visit

## 2013-05-12 ENCOUNTER — Ambulatory Visit (HOSPITAL_COMMUNITY): Payer: Medicare Other

## 2013-05-19 ENCOUNTER — Ambulatory Visit
Admission: RE | Admit: 2013-05-19 | Discharge: 2013-05-19 | Disposition: A | Discharge status: Home / Self Care | Payer: Medicare Other | Source: Ambulatory Visit | Attending: Radiation Oncology | Admitting: Radiation Oncology

## 2013-05-19 ENCOUNTER — Ambulatory Visit (HOSPITAL_COMMUNITY)
Admission: RE | Admit: 2013-05-19 | Discharge: 2013-05-19 | Disposition: A | Discharge status: Home / Self Care | Payer: Medicare Other | Source: Ambulatory Visit | Attending: Radiation Oncology | Admitting: Radiation Oncology

## 2013-05-19 DIAGNOSIS — I2510 Atherosclerotic heart disease of native coronary artery without angina pectoris: Secondary | ICD-10-CM | POA: Insufficient documentation

## 2013-05-19 DIAGNOSIS — C3490 Malignant neoplasm of unspecified part of unspecified bronchus or lung: Secondary | ICD-10-CM | POA: Insufficient documentation

## 2013-05-19 DIAGNOSIS — R911 Solitary pulmonary nodule: Secondary | ICD-10-CM | POA: Insufficient documentation

## 2013-05-19 DIAGNOSIS — C3492 Malignant neoplasm of unspecified part of left bronchus or lung: Secondary | ICD-10-CM

## 2013-05-19 DIAGNOSIS — J438 Other emphysema: Secondary | ICD-10-CM | POA: Insufficient documentation

## 2013-05-19 DIAGNOSIS — K8020 Calculus of gallbladder without cholecystitis without obstruction: Secondary | ICD-10-CM | POA: Insufficient documentation

## 2013-05-19 MED ORDER — IOHEXOL 300 MG/ML  SOLN
50.0000 mL | Freq: Once | INTRAMUSCULAR | Status: AC | PRN
  Administered 2013-05-19: 50 mL via INTRAVENOUS

## 2013-05-25 ENCOUNTER — Telehealth: Payer: Self-pay | Admitting: Radiation Oncology

## 2013-05-25 NOTE — Telephone Encounter (Signed)
Patient informed of good results on CT scan.

## 2013-11-03 ENCOUNTER — Ambulatory Visit
Admission: RE | Admit: 2013-11-03 | Discharge: 2013-11-03 | Disposition: A | Discharge status: Home / Self Care | Payer: Medicare HMO | Source: Ambulatory Visit | Attending: Radiation Oncology | Admitting: Radiation Oncology

## 2013-11-03 ENCOUNTER — Encounter: Payer: Self-pay | Admitting: Radiation Oncology

## 2013-11-03 VITALS — BP 156/87 | HR 88 | Temp 97.4°F | Ht 70.0 in | Wt 170.8 lb

## 2013-11-03 DIAGNOSIS — C3490 Malignant neoplasm of unspecified part of unspecified bronchus or lung: Secondary | ICD-10-CM

## 2013-11-03 DIAGNOSIS — Z79899 Other long term (current) drug therapy: Secondary | ICD-10-CM | POA: Insufficient documentation

## 2013-11-03 DIAGNOSIS — Z794 Long term (current) use of insulin: Secondary | ICD-10-CM | POA: Insufficient documentation

## 2013-11-03 DIAGNOSIS — C3410 Malignant neoplasm of upper lobe, unspecified bronchus or lung: Secondary | ICD-10-CM | POA: Insufficient documentation

## 2013-11-03 DIAGNOSIS — Z7901 Long term (current) use of anticoagulants: Secondary | ICD-10-CM | POA: Insufficient documentation

## 2013-11-03 DIAGNOSIS — Z7982 Long term (current) use of aspirin: Secondary | ICD-10-CM | POA: Insufficient documentation

## 2013-11-03 LAB — BUN AND CREATININE (CC13)
BUN: 32.9 mg/dL — ABNORMAL HIGH (ref 7.0–26.0)
Creatinine: 1.6 mg/dL — ABNORMAL HIGH (ref 0.7–1.3)

## 2013-11-03 NOTE — Progress Notes (Signed)
Brandon Clay here for follow up after treatment to his left lung.  He denies pan.  He reports having sharp left chest pain after doing work in his bathroom a few months ago.  He said that has resolved.  He denies shortness of breath, cough and hemoptysis.  He reports that he starts to stumble when he starts walking. He denies fatigue.  He reports a good appetite.  His bp today is elevated at 156/87.

## 2013-11-03 NOTE — Progress Notes (Signed)
  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  He is doing well this time. He denies any pain in the chest area,  cough or shortness of breath. He denies any hemoptysis. No new bony pain headaches dizziness or blurred vision.   ALLERGIES:  has No Known Allergies.  Meds: Current Outpatient Prescriptions  Medication Sig Dispense Refill  . aspirin EC 81 MG tablet Take 81 mg by mouth every other day.      . budesonide-formoterol (SYMBICORT) 160-4.5 MCG/ACT inhaler Inhale 2 puffs into the lungs 2 (two) times daily.      . digoxin (LANOXIN) 0.125 MG tablet Take 1 tablet (0.125 mg total) by mouth daily.  30 tablet  0  . diltiazem (CARDIZEM) 30 MG tablet Take 240 mg by mouth daily.      . furosemide (LASIX) 20 MG tablet Take 20 mg by mouth 2 (two) times daily.       . insulin detemir (LEVEMIR) 100 UNIT/ML injection Inject 5 Units into the skin 2 (two) times daily.      . metoprolol tartrate (LOPRESSOR) 25 MG tablet Take 1 tablet (25 mg total) by mouth 2 (two) times daily.  60 tablet  0  . Multiple Vitamin (MULTIVITAMIN WITH MINERALS) TABS Take 1 tablet by mouth daily.  30 tablet  0  . NOVOLOG FLEXPEN 100 UNIT/ML SOPN FlexPen       . potassium chloride SA (K-DUR,KLOR-CON) 20 MEQ tablet Take 20 mEq by mouth daily.       . Tamsulosin HCl (FLOMAX) 0.4 MG CAPS Take 0.4 mg by mouth every morning.       . warfarin (COUMADIN) 1 MG tablet Take 9 mg by mouth daily. Takes 7 mg Monday and Thursday, 8 mg all other days       No current facility-administered medications for this encounter.    Physical Findings: The patient is in no  acute distress. Patient is alert and oriented.  height is 5\' 10"  (1.778 m) and weight is 170 lb 12.8 oz (77.474 kg). His oral temperature is 97.4 F (36.3 C). His blood pressure is 156/87 and his pulse is 88. His oxygen saturation is 96%. .  No palpable supraclavicular or axillary adenopathy. The lungs are clear to auscultation. The heart has regular rhythm and rate.   Lab Findings: Lab Results  Component Value Date   WBC 9.0 09/17/2012   HGB 8.8* 09/17/2012   HCT 27.1* 09/17/2012   MCV 80.4 09/17/2012   PLT 182 09/17/2012     Radiographic Findings: No results found.  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 Patient is doing well clinically at this time.   Plan:   BUN/creatinine and chest CT scan.  routine followup in 6 months.   ____________________________________ Blair Promise, MD

## 2013-11-10 ENCOUNTER — Encounter (HOSPITAL_COMMUNITY): Payer: Self-pay

## 2013-11-10 ENCOUNTER — Ambulatory Visit (HOSPITAL_COMMUNITY)
Admission: RE | Admit: 2013-11-10 | Discharge: 2013-11-10 | Disposition: A | Discharge status: Home / Self Care | Payer: Medicare HMO | Source: Ambulatory Visit | Attending: Radiation Oncology | Admitting: Radiation Oncology

## 2013-11-10 DIAGNOSIS — Z923 Personal history of irradiation: Secondary | ICD-10-CM | POA: Insufficient documentation

## 2013-11-10 DIAGNOSIS — C3490 Malignant neoplasm of unspecified part of unspecified bronchus or lung: Secondary | ICD-10-CM

## 2013-11-10 DIAGNOSIS — C3410 Malignant neoplasm of upper lobe, unspecified bronchus or lung: Secondary | ICD-10-CM | POA: Insufficient documentation

## 2013-11-10 MED ORDER — IOHEXOL 300 MG/ML  SOLN
60.0000 mL | Freq: Once | INTRAMUSCULAR | Status: AC | PRN
  Administered 2013-11-10: 60 mL via INTRAVENOUS

## 2014-05-01 ENCOUNTER — Ambulatory Visit: Payer: Medicare HMO | Admitting: Radiation Oncology

## 2014-05-08 ENCOUNTER — Ambulatory Visit
Admission: RE | Admit: 2014-05-08 | Discharge: 2014-05-08 | Disposition: A | Discharge status: Home / Self Care | Payer: Medicare HMO | Source: Ambulatory Visit | Attending: Radiation Oncology | Admitting: Radiation Oncology

## 2014-05-08 ENCOUNTER — Encounter: Payer: Self-pay | Admitting: Radiation Oncology

## 2014-05-08 ENCOUNTER — Ambulatory Visit: Payer: Medicare HMO | Admitting: Radiation Oncology

## 2014-05-08 VITALS — BP 164/68 | HR 78 | Temp 97.8°F | Resp 12 | Wt 169.8 lb

## 2014-05-08 DIAGNOSIS — C3492 Malignant neoplasm of unspecified part of left bronchus or lung: Secondary | ICD-10-CM

## 2014-05-08 NOTE — Progress Notes (Signed)
He is currently in no pain. Pt complains of, Generalized Weakness. Coughing  Productive and Color of Phlegm is [place], started a few weeks ago. Reports his nose occasionally has green drainage. Pt is on room air. Noted skin is warm dry and intact. Pt denies dysphagia.

## 2014-05-08 NOTE — Progress Notes (Signed)
  Radiation Oncology         [phone] ________________________________  Name: Brandon Clay MRN: [id number]  Date: 05/08/2014  DOB: Jul 04, 1934  Follow-Up Visit Note  CC: Lillard Anes, MD  Grace Isaac, MD    ICD-9-CM ICD-10-CM   1. Squamous cell lung cancer, left 162.9 C34.92 BUN     Creatinine, serum     CT Chest W Contrast    Diagnosis:   Clinical stage IB squamous cell carcinoma of the left lung   Interval Since Last Radiation:  One year and 7 months    Narrative:  The patient returns today for routine follow-up.  He is doing well at this time. He has generalized fatigue but no new complaints. He denies any cough or significant shortness of breath. He denies any hemoptysis or pain within the chest area.                              ALLERGIES:  has No Known Allergies.  Meds: Current Outpatient Prescriptions  Medication Sig Dispense Refill  . aspirin EC 81 MG tablet Take 81 mg by mouth every other day.    . digoxin (LANOXIN) 0.125 MG tablet Take 1 tablet (0.125 mg total) by mouth daily. 30 tablet 0  . furosemide (LASIX) 20 MG tablet Take 20 mg by mouth 2 (two) times daily.     . insulin detemir (LEVEMIR) 100 UNIT/ML injection Inject 5 Units into the skin 2 (two) times daily.    . metoprolol tartrate (LOPRESSOR) 25 MG tablet Take 1 tablet (25 mg total) by mouth 2 (two) times daily. 60 tablet 0  . Multiple Vitamin (MULTIVITAMIN WITH MINERALS) TABS Take 1 tablet by mouth daily. 30 tablet 0  . NOVOLOG FLEXPEN 100 UNIT/ML SOPN FlexPen     . potassium chloride SA (K-DUR,KLOR-CON) 20 MEQ tablet Take 20 mEq by mouth daily.     Marland Kitchen warfarin (COUMADIN) 1 MG tablet Take 9 mg by mouth daily. Takes 7 mg Monday and Thursday, 8 mg all other days    . budesonide-formoterol (SYMBICORT) 160-4.5 MCG/ACT inhaler Inhale 2 puffs into the lungs 2 (two) times daily.    Marland Kitchen diltiazem (CARDIZEM) 30 MG tablet Take 240 mg by mouth daily.    . Tamsulosin HCl (FLOMAX) 0.4 MG CAPS Take 0.4  mg by mouth every morning.      No current facility-administered medications for this encounter.    Physical Findings: The patient is in no acute distress. Patient is alert and oriented.  weight is 169 lb 12.8 oz (77.021 kg). His oral temperature is 97.8 F (36.6 C). His blood pressure is 164/68 and his pulse is 78. His respiration is 12 and oxygen saturation is 95%. . No palpable supraclavicular or axillary adenopathy. Lungs are clear to auscultation. The heart has a regular rhythm and rate.  Lab Findings: Lab Results  Component Value Date   WBC 9.0 09/17/2012   HGB 8.8* 09/17/2012   HCT 27.1* 09/17/2012   MCV 80.4 09/17/2012   PLT 182 09/17/2012    Radiographic Findings: No results found.  Impression:  No evidence of recurrence on clinical exam today  Plan:  Routine followup in 6 months. The patient will be scheduled for a repeat CT scan of the chest in early January of 2016 with BUN and creatinine prior to the scan.  ____________________________________ Blair Promise, MD

## 2014-05-09 ENCOUNTER — Telehealth: Payer: Self-pay | Admitting: *Deleted

## 2014-05-09 NOTE — Telephone Encounter (Signed)
CALLED PATIENT TO INFORM OF LAB AND TEST ON 06-13-14, NO ANSWER WILL CALL LATER.

## 2014-05-11 ENCOUNTER — Telehealth: Payer: Self-pay | Admitting: *Deleted

## 2014-05-11 NOTE — Telephone Encounter (Signed)
CALLED PATIENT'S DAUGHTER DARLENE ALLRED TO INFORM OF LAB, CT AND FU VISIT, SPOKE WITH DARLENE ALLRED AND SHE IS AWARE OF THESE APPTS.

## 2014-05-18 ENCOUNTER — Encounter (HOSPITAL_COMMUNITY): Payer: Self-pay | Admitting: Cardiovascular Disease

## 2014-06-13 ENCOUNTER — Ambulatory Visit (HOSPITAL_COMMUNITY)
Admission: RE | Admit: 2014-06-13 | Discharge: 2014-06-13 | Disposition: A | Discharge status: Home / Self Care | Payer: Medicare Other | Source: Ambulatory Visit | Attending: Radiation Oncology | Admitting: Radiation Oncology

## 2014-06-13 ENCOUNTER — Ambulatory Visit
Admission: RE | Admit: 2014-06-13 | Discharge: 2014-06-13 | Disposition: A | Discharge status: Home / Self Care | Payer: Medicare Other | Source: Ambulatory Visit | Attending: Radiation Oncology | Admitting: Radiation Oncology

## 2014-06-13 ENCOUNTER — Encounter (HOSPITAL_COMMUNITY): Payer: Self-pay

## 2014-06-13 DIAGNOSIS — I517 Cardiomegaly: Secondary | ICD-10-CM | POA: Diagnosis not present

## 2014-06-13 DIAGNOSIS — Z923 Personal history of irradiation: Secondary | ICD-10-CM | POA: Insufficient documentation

## 2014-06-13 DIAGNOSIS — C3490 Malignant neoplasm of unspecified part of unspecified bronchus or lung: Secondary | ICD-10-CM | POA: Diagnosis not present

## 2014-06-13 DIAGNOSIS — R918 Other nonspecific abnormal finding of lung field: Secondary | ICD-10-CM | POA: Diagnosis not present

## 2014-06-13 DIAGNOSIS — R911 Solitary pulmonary nodule: Secondary | ICD-10-CM | POA: Insufficient documentation

## 2014-06-13 DIAGNOSIS — J90 Pleural effusion, not elsewhere classified: Secondary | ICD-10-CM | POA: Diagnosis not present

## 2014-06-13 DIAGNOSIS — J449 Chronic obstructive pulmonary disease, unspecified: Secondary | ICD-10-CM | POA: Insufficient documentation

## 2014-06-13 DIAGNOSIS — K8020 Calculus of gallbladder without cholecystitis without obstruction: Secondary | ICD-10-CM | POA: Insufficient documentation

## 2014-06-13 DIAGNOSIS — I2510 Atherosclerotic heart disease of native coronary artery without angina pectoris: Secondary | ICD-10-CM | POA: Insufficient documentation

## 2014-06-13 DIAGNOSIS — C3492 Malignant neoplasm of unspecified part of left bronchus or lung: Secondary | ICD-10-CM

## 2014-06-13 LAB — BUN AND CREATININE (CC13)
BUN: 30.5 mg/dL — AB (ref 7.0–26.0)
Creatinine: 1.5 mg/dL — ABNORMAL HIGH (ref 0.7–1.3)
EGFR: 44 mL/min/{1.73_m2} — ABNORMAL LOW (ref >90)

## 2014-06-13 MED ORDER — IOHEXOL 300 MG/ML  SOLN
80.0000 mL | Freq: Once | INTRAMUSCULAR | Status: AC | PRN
  Administered 2014-06-13: 80 mL via INTRAVENOUS

## 2014-06-19 ENCOUNTER — Encounter: Payer: Self-pay | Admitting: Radiation Oncology

## 2014-06-19 NOTE — Progress Notes (Signed)
   Department of Radiation Oncology  Phone:  [phone] Fax:        [phone]  Documentation:  Pt. Informed of results on recent Chest CT scan...  No sxs of pneumonia.  Pt to call if he develops cough fever or breathing problems otherwise routine f/u as already scheduled.   Blair Promise, MD

## 2014-10-23 ENCOUNTER — Ambulatory Visit
Admission: RE | Admit: 2014-10-23 | Discharge: 2014-10-23 | Disposition: A | Discharge status: Home / Self Care | Payer: Medicare HMO | Source: Ambulatory Visit | Attending: Radiation Oncology | Admitting: Radiation Oncology

## 2014-10-23 ENCOUNTER — Encounter: Payer: Self-pay | Admitting: Radiation Oncology

## 2014-10-23 VITALS — BP 96/36 | HR 82 | Temp 97.6°F | Resp 12 | Ht 70.0 in | Wt 173.4 lb

## 2014-10-23 DIAGNOSIS — C3492 Malignant neoplasm of unspecified part of left bronchus or lung: Secondary | ICD-10-CM

## 2014-10-23 NOTE — Progress Notes (Signed)
  Radiation Oncology         [phone] ________________________________  Name: Brandon Clay MRN: [id number]  Date: 10/23/2014  DOB: September 22, 1934  Follow-Up Visit Note  CC: Landry Mellow, MD    ICD-9-CM ICD-10-CM   1. Squamous cell lung cancer, left 162.9 C34.92 CT Chest Wo Contrast    Diagnosis:   Squamous cell lung cancer, left. Clinical Stage I-B  Interval Since Last Radiation:  2 years and 1 month, s/p SBRT  Narrative:  The patient returns today for routine follow-up. He is doing well as far as his lung cancer concern. He denies any pain within the chest  cough or breathing problems. He denies any hemoptysis. He has had some problems with balance and will see his primary care physician concerning this issue.  He denies any headaches or visual problems                        ALLERGIES:  has No Known Allergies.  Meds: Current Outpatient Prescriptions  Medication Sig Dispense Refill  . aspirin EC 81 MG tablet Take 81 mg by mouth every other day.    . digoxin (LANOXIN) 0.125 MG tablet Take 1 tablet (0.125 mg total) by mouth daily. 30 tablet 0  . diltiazem (CARDIZEM) 30 MG tablet Take 240 mg by mouth daily.    . furosemide (LASIX) 20 MG tablet Take 20 mg by mouth 2 (two) times daily.     . insulin detemir (LEVEMIR) 100 UNIT/ML injection Inject 5 Units into the skin 2 (two) times daily.    Marland Kitchen lisinopril (PRINIVIL,ZESTRIL) 10 MG tablet     . metoprolol tartrate (LOPRESSOR) 25 MG tablet Take 1 tablet (25 mg total) by mouth 2 (two) times daily. 60 tablet 0  . Multiple Vitamin (MULTIVITAMIN WITH MINERALS) TABS Take 1 tablet by mouth daily. 30 tablet 0  . NOVOLOG FLEXPEN 100 UNIT/ML SOPN FlexPen     . potassium chloride SA (K-DUR,KLOR-CON) 20 MEQ tablet Take 20 mEq by mouth daily.     Marland Kitchen warfarin (COUMADIN) 1 MG tablet Take 9 mg by mouth daily. Takes 7 mg Monday and Thursday, 8 mg all other days    . budesonide-formoterol (SYMBICORT) 160-4.5 MCG/ACT inhaler  Inhale 2 puffs into the lungs 2 (two) times daily.    . Tamsulosin HCl (FLOMAX) 0.4 MG CAPS Take 0.4 mg by mouth every morning.      No current facility-administered medications for this encounter.    Physical Findings: The patient is in no acute distress. Patient is alert and oriented.  height is '5\' 10"'$  (1.778 m) and weight is 173 lb 6.4 oz (78.654 kg). His oral temperature is 97.6 F (36.4 C). His blood pressure is 96/36 and his pulse is 82. His respiration is 12 and oxygen saturation is 95%. .  No palpable supraclavicular or axillary adenopathy. Lungs are clear to auscultation. The heart has a regular rhythm and rate.  Lab Findings: Lab Results  Component Value Date   WBC 9.0 09/17/2012   HGB 8.8* 09/17/2012   HCT 27.1* 09/17/2012   MCV 80.4 09/17/2012   PLT 182 09/17/2012    Radiographic Findings: No results found.  Impression:  No evidence of recurrence on clinical exam today  Plan:  CT scan of the chest in the near future and routine follow-up in 6 months  ____________________________________ Blair Promise, MD

## 2014-10-23 NOTE — Progress Notes (Signed)
Brandon Clay here for follow up.  He denies pain and shortness of breath.  He reports an occasional dry cough. He denies hemoptysis.  He reports that he has been having trouble controlling his feet.  He reports he starts off walking and then starts running.  He reports feeling dizzy when standing.  His bp was low at 96/36 in his right arm and then was 134/61 in his left arm.  He reports fatigue.  BP 96/36 mmHg  Pulse 82  Temp(Src) 97.6 F (36.4 C) (Oral)  Resp 12  Ht '5\' 10"'$  (1.778 m)  Wt 173 lb 6.4 oz (78.654 kg)  BMI 24.88 kg/m2  SpO2 95%

## 2014-10-24 ENCOUNTER — Telehealth: Payer: Self-pay | Admitting: *Deleted

## 2014-10-24 NOTE — Telephone Encounter (Signed)
CALLED PATIENT'S DAUGHTER, DARLENE ALLRED PER PATIENT REQUEST, TO INFORM OF SCAN AND FU, SPOKE WITH DARLENE ALLRED AND SHE IS AWARE OF THESE APPTS.

## 2014-11-08 ENCOUNTER — Ambulatory Visit (HOSPITAL_COMMUNITY)
Admission: RE | Admit: 2014-11-08 | Discharge: 2014-11-08 | Disposition: A | Discharge status: Home / Self Care | Payer: Medicare Other | Source: Ambulatory Visit | Attending: Radiation Oncology | Admitting: Radiation Oncology

## 2014-11-08 DIAGNOSIS — Z923 Personal history of irradiation: Secondary | ICD-10-CM | POA: Insufficient documentation

## 2014-11-08 DIAGNOSIS — C3412 Malignant neoplasm of upper lobe, left bronchus or lung: Secondary | ICD-10-CM | POA: Diagnosis not present

## 2014-11-08 DIAGNOSIS — J439 Emphysema, unspecified: Secondary | ICD-10-CM | POA: Insufficient documentation

## 2014-11-08 DIAGNOSIS — C3492 Malignant neoplasm of unspecified part of left bronchus or lung: Secondary | ICD-10-CM

## 2014-11-08 DIAGNOSIS — Z9221 Personal history of antineoplastic chemotherapy: Secondary | ICD-10-CM | POA: Insufficient documentation

## 2015-04-23 ENCOUNTER — Ambulatory Visit
Admission: RE | Admit: 2015-04-23 | Discharge: 2015-04-23 | Disposition: A | Discharge status: Home / Self Care | Payer: Medicare Other | Source: Ambulatory Visit | Attending: Radiation Oncology | Admitting: Radiation Oncology

## 2015-04-23 ENCOUNTER — Encounter: Payer: Self-pay | Admitting: Radiation Oncology

## 2015-04-23 VITALS — BP 171/70 | HR 87 | Temp 98.2°F | Resp 18 | Ht 70.0 in | Wt 179.3 lb

## 2015-04-23 DIAGNOSIS — C3492 Malignant neoplasm of unspecified part of left bronchus or lung: Secondary | ICD-10-CM

## 2015-04-23 NOTE — Progress Notes (Signed)
  Radiation Oncology         [phone] ________________________________  Name: Brandon Clay MRN: [id number]  Date: 04/23/2015  DOB: [birth year]  Follow-Up Visit Note  CC: Landry Mellow, MD    ICD-9-CM ICD-10-CM   1. Squamous cell lung cancer, left (HCC) 162.9 C34.92     Diagnosis:   Squamous cell lung cancer, left. Clinical Stage I-B  Interval Since Last Radiation:  2 years and 7 months, s/p SBRT  Narrative:  The patient returns today for routine follow-up.  Patient is doing well at this time. He denies any cough or breathing problems. He did have one episode of hemoptysis related to Xarelto but has stopped using this medication in light of this issue. He had one episode of mild chest pain 2-3 weeks ago but none since. He has had some problems with his balance and will be seeing his primary care physician concerning this issue.                              ALLERGIES:  has No Known Allergies.  Meds: Current Outpatient Prescriptions  Medication Sig Dispense Refill  . aspirin EC 81 MG tablet Take 81 mg by mouth every other day.    . digoxin (LANOXIN) 0.125 MG tablet Take 1 tablet (0.125 mg total) by mouth daily. 30 tablet 0  . furosemide (LASIX) 20 MG tablet Take 20 mg by mouth 2 (two) times daily.     . insulin detemir (LEVEMIR) 100 UNIT/ML injection Inject 5 Units into the skin 2 (two) times daily.    Marland Kitchen lisinopril (PRINIVIL,ZESTRIL) 10 MG tablet     . metFORMIN (GLUCOPHAGE-XR) 500 MG 24 hr tablet     . metoprolol tartrate (LOPRESSOR) 25 MG tablet Take 1 tablet (25 mg total) by mouth 2 (two) times daily. 60 tablet 0  . Multiple Vitamin (MULTIVITAMIN WITH MINERALS) TABS Take 1 tablet by mouth daily. 30 tablet 0  . potassium chloride SA (K-DUR,KLOR-CON) 20 MEQ tablet Take 20 mEq by mouth daily.     Marland Kitchen warfarin (COUMADIN) 1 MG tablet Take 9 mg by mouth daily. Takes 7 mg Monday and Thursday, 8 mg all other days    . NOVOLOG FLEXPEN 100 UNIT/ML SOPN FlexPen      . Tamsulosin HCl (FLOMAX) 0.4 MG CAPS Take 0.4 mg by mouth every morning.      No current facility-administered medications for this encounter.    Physical Findings: The patient is in no acute distress. Patient is alert and oriented.  height is '5\' 10"'$  (1.778 m) and weight is 179 lb 4.8 oz (81.33 kg). His oral temperature is 98.2 F (36.8 C). His blood pressure is 171/70 and his pulse is 87. His respiration is 18 and oxygen saturation is 98%. .  No palpable subclavicular or axillary adenopathy. The lungs are clear to auscultation. The heart has a regular rhythm and rate.  Lab Findings: Lab Results  Component Value Date   WBC 9.0 09/17/2012   HGB 8.8* 09/17/2012   HCT 27.1* 09/17/2012   MCV 80.4 09/17/2012   PLT 182 09/17/2012    Radiographic Findings: No results found.  Impression: No evidence of recurrence on clinical exam today  Plan:  CT scan of the chest without contrast later this week depending on scheduling issues. Routine follow-up in radiation oncology in 6 months.  ____________________________________ Gery Pray, MD

## 2015-04-23 NOTE — Progress Notes (Addendum)
Brandon Clay here for follow up.  He reports having pain in his left chest about 2-3 weeks ago.  He denies shortness of breath and cough.  He reports having hemoptysis after taking xarelto 2 months ago.  He has not seen any since he stopped taking it.  He reports he has a good appetite and energy level.  He reports he still is having trouble with his balance and feels like his head is going faster than his feet.  He reports feeling weakness in his lower legs with activity.  BP 171/70 mmHg  Pulse 87  Temp(Src) 98.2 F (36.8 C) (Oral)  Resp 18  Ht '5\' 10"'$  (1.778 m)  Wt 179 lb 4.8 oz (81.33 kg)  BMI 25.73 kg/m2  SpO2 98%

## 2015-04-24 ENCOUNTER — Telehealth: Payer: Self-pay | Admitting: *Deleted

## 2015-04-24 NOTE — Telephone Encounter (Signed)
CALLED PATIENT'S DAUGHTER, DARLENE ALLRED TO INFORM OF CT ON 05-10-15 - ARRIVAL TIME - 9:15 AM @ [facility] AND HIS FU ON 10-25-15 @ 9:50 AM, SPOKE WITH PATIENT'S DAUGHTER - DARLENE ALLRED AND SHE IS AWARE OF THESE APPTS.

## 2015-04-26 ENCOUNTER — Ambulatory Visit: Payer: Medicare Other | Admitting: Radiation Oncology

## 2015-04-27 ENCOUNTER — Ambulatory Visit (HOSPITAL_COMMUNITY): Payer: Medicare Other

## 2015-05-10 ENCOUNTER — Encounter (HOSPITAL_COMMUNITY): Payer: Self-pay

## 2015-05-10 ENCOUNTER — Ambulatory Visit (HOSPITAL_COMMUNITY)
Admission: RE | Admit: 2015-05-10 | Discharge: 2015-05-10 | Disposition: A | Discharge status: Home / Self Care | Payer: Medicare Other | Source: Ambulatory Visit | Attending: Radiation Oncology | Admitting: Radiation Oncology

## 2015-05-10 DIAGNOSIS — J439 Emphysema, unspecified: Secondary | ICD-10-CM | POA: Diagnosis not present

## 2015-05-10 DIAGNOSIS — I2510 Atherosclerotic heart disease of native coronary artery without angina pectoris: Secondary | ICD-10-CM | POA: Insufficient documentation

## 2015-05-10 DIAGNOSIS — E042 Nontoxic multinodular goiter: Secondary | ICD-10-CM | POA: Diagnosis not present

## 2015-05-10 DIAGNOSIS — K8020 Calculus of gallbladder without cholecystitis without obstruction: Secondary | ICD-10-CM | POA: Insufficient documentation

## 2015-05-10 DIAGNOSIS — S2242XA Multiple fractures of ribs, left side, initial encounter for closed fracture: Secondary | ICD-10-CM | POA: Diagnosis not present

## 2015-05-10 DIAGNOSIS — C3492 Malignant neoplasm of unspecified part of left bronchus or lung: Secondary | ICD-10-CM | POA: Diagnosis present

## 2015-05-10 DIAGNOSIS — R590 Localized enlarged lymph nodes: Secondary | ICD-10-CM | POA: Insufficient documentation

## 2015-05-10 DIAGNOSIS — I311 Chronic constrictive pericarditis: Secondary | ICD-10-CM | POA: Diagnosis not present

## 2015-05-10 DIAGNOSIS — Z932 Ileostomy status: Secondary | ICD-10-CM | POA: Diagnosis not present

## 2015-10-09 ENCOUNTER — Other Ambulatory Visit (HOSPITAL_COMMUNITY): Payer: Self-pay | Admitting: Physician Assistant

## 2015-10-09 DIAGNOSIS — I739 Peripheral vascular disease, unspecified: Secondary | ICD-10-CM

## 2015-10-11 ENCOUNTER — Ambulatory Visit (HOSPITAL_COMMUNITY): Payer: Medicare Other

## 2015-10-12 ENCOUNTER — Ambulatory Visit (HOSPITAL_COMMUNITY)
Admission: RE | Admit: 2015-10-12 | Discharge: 2015-10-12 | Disposition: A | Discharge status: Home / Self Care | Payer: Medicare Other | Source: Ambulatory Visit | Attending: Physician Assistant | Admitting: Physician Assistant

## 2015-10-12 DIAGNOSIS — N189 Chronic kidney disease, unspecified: Secondary | ICD-10-CM | POA: Insufficient documentation

## 2015-10-12 DIAGNOSIS — N400 Enlarged prostate without lower urinary tract symptoms: Secondary | ICD-10-CM | POA: Diagnosis not present

## 2015-10-12 DIAGNOSIS — I481 Persistent atrial fibrillation: Secondary | ICD-10-CM | POA: Diagnosis not present

## 2015-10-12 DIAGNOSIS — I129 Hypertensive chronic kidney disease with stage 1 through stage 4 chronic kidney disease, or unspecified chronic kidney disease: Secondary | ICD-10-CM | POA: Insufficient documentation

## 2015-10-12 DIAGNOSIS — I2510 Atherosclerotic heart disease of native coronary artery without angina pectoris: Secondary | ICD-10-CM | POA: Insufficient documentation

## 2015-10-12 DIAGNOSIS — E7800 Pure hypercholesterolemia, unspecified: Secondary | ICD-10-CM | POA: Diagnosis not present

## 2015-10-12 DIAGNOSIS — E1122 Type 2 diabetes mellitus with diabetic chronic kidney disease: Secondary | ICD-10-CM | POA: Diagnosis not present

## 2015-10-12 DIAGNOSIS — I739 Peripheral vascular disease, unspecified: Secondary | ICD-10-CM | POA: Diagnosis not present

## 2015-10-12 NOTE — Progress Notes (Signed)
VASCULAR LAB PRELIMINARY  ARTERIAL  ABI completed:    RIGHT    LEFT    PRESSURE WAVEFORM  PRESSURE WAVEFORM  BRACHIAL 129 Monophasic BRACHIAL 134 Monophasic  DP 88 Dampened monophasic DP 129 Dampened monophasic  AT   AT    PT 170 Dampened monophasic PT 200 Dampened monophasic  PER   PER    GREAT TOE  NA GREAT TOE  NA    RIGHT LEFT  ABI 1.27 1.49    The right ABI is within normal limits, however the waveforms are abnormal suggestive of possible medial calcification and/or proximal stenosis.  The left ABI is elevated with abnormal waveforms, suggestive of medial calcification and/or proximal stenosis.   10/12/2015 10:18 AM Maudry Mayhew, RVT, RDCS, RDMS

## 2015-10-25 ENCOUNTER — Encounter: Payer: Self-pay | Admitting: Radiation Oncology

## 2015-10-25 ENCOUNTER — Telehealth: Payer: Self-pay | Admitting: *Deleted

## 2015-10-25 ENCOUNTER — Ambulatory Visit
Admission: RE | Admit: 2015-10-25 | Discharge: 2015-10-25 | Disposition: A | Discharge status: Home / Self Care | Payer: Medicare Other | Source: Ambulatory Visit | Attending: Radiation Oncology | Admitting: Radiation Oncology

## 2015-10-25 VITALS — BP 143/53 | HR 66 | Temp 97.7°F | Ht 70.0 in | Wt 179.0 lb

## 2015-10-25 DIAGNOSIS — C3492 Malignant neoplasm of unspecified part of left bronchus or lung: Secondary | ICD-10-CM

## 2015-10-25 NOTE — Progress Notes (Signed)
Brandon Clay here for follow up.  He denies having pain.  He reports he coughed up some blood last week.  He is taking coumadin and his dose was adjusted.  He has not had any this week.  He denies having shortness of breath or coughing.  He reports having a good energy level.  BP 143/53 mmHg  Pulse 66  Temp(Src) 97.7 F (36.5 C) (Oral)  Ht '5\' 10"'$  (1.778 m)  Wt 179 lb (81.194 kg)  BMI 25.68 kg/m2  SpO2 94%   Wt Readings from Last 3 Encounters:  10/25/15 179 lb (81.194 kg)  04/23/15 179 lb 4.8 oz (81.33 kg)  10/23/14 173 lb 6.4 oz (78.654 kg)

## 2015-10-25 NOTE — Telephone Encounter (Signed)
CALLED  PATIENT TO INFORM OF CT FOR 11-01-15, - ARRIVAL TIME - 10:15 AM @ [facility], NO RESTRICTIONS, SPOKE WITH PATIENT'S WIFE NANCY AND SHE IS AWARE OF THIS TEST

## 2015-10-25 NOTE — Progress Notes (Signed)
Radiation Oncology         [phone] ________________________________  Name: Brandon Clay MRN: [id number]  Date: 10/25/2015  DOB: January 03, 1935  Follow-Up Visit Note  CC: Landry Mellow, MD    ICD-9-CM ICD-10-CM   1. Squamous cell lung cancer, left (HCC) 162.9 C34.92 CT Chest Wo Contrast    Diagnosis:   Squamous cell lung cancer, left. Clinical Stage I-B  Interval Since Last Radiation:  3 years 1 1/2 months; 08/23/2012 through 09/07/2012  Site/dose:   Left upper chest region 60 Gy in 5 fractions (12 Gy per fraction)  Narrative:  The patient returns today for routine follow-up. Patient is doing well at this time. He reports he coughed up some blood last week. He is taking Coumadin and his dose was adjusted. He has not had any this week. He denies having shortness of breath or coughing. He denies any chest pain. He reports having a good energy level. He reports leg weakness as well as pain upon walking up inclines. He is going to see a specialist for this. He denies any headaches.                           ALLERGIES:  has No Known Allergies.  Meds: Current Outpatient Prescriptions  Medication Sig Dispense Refill  . aspirin EC 81 MG tablet Take 81 mg by mouth every other day.    . digoxin (LANOXIN) 0.125 MG tablet Take 1 tablet (0.125 mg total) by mouth daily. 30 tablet 0  . furosemide (LASIX) 20 MG tablet Take 20 mg by mouth 2 (two) times daily.     . insulin glargine (LANTUS) 100 UNIT/ML injection Inject 16 Units into the skin 2 (two) times daily.    Marland Kitchen lisinopril (PRINIVIL,ZESTRIL) 10 MG tablet     . metFORMIN (GLUCOPHAGE-XR) 500 MG 24 hr tablet     . metoprolol tartrate (LOPRESSOR) 25 MG tablet Take 1 tablet (25 mg total) by mouth 2 (two) times daily. 60 tablet 0  . Multiple Vitamin (MULTIVITAMIN WITH MINERALS) TABS Take 1 tablet by mouth daily. 30 tablet 0  . potassium chloride SA (K-DUR,KLOR-CON) 20 MEQ tablet Take 20 mEq by mouth daily.     Marland Kitchen  warfarin (COUMADIN) 1 MG tablet Take 9 mg by mouth daily. Takes 7 mg Monday and Thursday, 8 mg all other days    . insulin detemir (LEVEMIR) 100 UNIT/ML injection Inject 5 Units into the skin 2 (two) times daily. Reported on 10/25/2015    . NOVOLOG FLEXPEN 100 UNIT/ML SOPN FlexPen Reported on 10/25/2015    . Tamsulosin HCl (FLOMAX) 0.4 MG CAPS Take 0.4 mg by mouth every morning. Reported on 10/25/2015     No current facility-administered medications for this encounter.   Physical Findings: The patient is in no acute distress. Patient is alert and oriented.  height is '5\' 10"'$  (1.778 m) and weight is 179 lb (81.194 kg). His oral temperature is 97.7 F (36.5 C). His blood pressure is 143/53 and his pulse is 66. His oxygen saturation is 94%.  No palpable cervical, subclavicular or axillary adenopathy. The lungs are clear to auscultation. The heart has a regular rhythm and rate.  Lab Findings: Lab Results  Component Value Date   WBC 9.0 09/17/2012   HGB 8.8* 09/17/2012   HCT 27.1* 09/17/2012   MCV 80.4 09/17/2012   PLT 182 09/17/2012    Radiographic Findings: No results found.  Impression: No  clinical evidence of recurrence on exam today  Plan: Chest CT scan will be scheduled for next week. I will see him back for routine follow up in 6 months.   -----------------------------------  Blair Promise, PhD, MD This document serves as a record of services personally performed by Gery Pray, MD. It was created on his behalf by Jenell Milliner, a trained medical scribe. The creation of this record is based on the scribe's personal observations and the provider's statements to them. This document has been checked and approved by the attending provider.

## 2015-11-01 ENCOUNTER — Ambulatory Visit (HOSPITAL_COMMUNITY): Payer: Medicare Other

## 2015-11-01 ENCOUNTER — Ambulatory Visit (HOSPITAL_COMMUNITY)
Admission: RE | Admit: 2015-11-01 | Discharge: 2015-11-01 | Disposition: A | Discharge status: Home / Self Care | Payer: Medicare Other | Source: Ambulatory Visit | Attending: Radiation Oncology | Admitting: Radiation Oncology

## 2015-11-01 DIAGNOSIS — R590 Localized enlarged lymph nodes: Secondary | ICD-10-CM | POA: Insufficient documentation

## 2015-11-01 DIAGNOSIS — I311 Chronic constrictive pericarditis: Secondary | ICD-10-CM | POA: Diagnosis not present

## 2015-11-01 DIAGNOSIS — J439 Emphysema, unspecified: Secondary | ICD-10-CM | POA: Diagnosis not present

## 2015-11-01 DIAGNOSIS — C3492 Malignant neoplasm of unspecified part of left bronchus or lung: Secondary | ICD-10-CM | POA: Diagnosis not present

## 2015-11-01 DIAGNOSIS — Z923 Personal history of irradiation: Secondary | ICD-10-CM | POA: Diagnosis not present

## 2015-11-01 DIAGNOSIS — I2510 Atherosclerotic heart disease of native coronary artery without angina pectoris: Secondary | ICD-10-CM | POA: Insufficient documentation

## 2015-11-01 DIAGNOSIS — S2242XA Multiple fractures of ribs, left side, initial encounter for closed fracture: Secondary | ICD-10-CM | POA: Diagnosis not present

## 2015-11-01 DIAGNOSIS — K8020 Calculus of gallbladder without cholecystitis without obstruction: Secondary | ICD-10-CM | POA: Diagnosis not present

## 2015-11-01 DIAGNOSIS — R911 Solitary pulmonary nodule: Secondary | ICD-10-CM | POA: Insufficient documentation

## 2015-11-02 ENCOUNTER — Ambulatory Visit (HOSPITAL_COMMUNITY): Payer: Medicare Other

## 2015-11-08 ENCOUNTER — Other Ambulatory Visit: Payer: Self-pay | Admitting: *Deleted

## 2015-11-08 DIAGNOSIS — I739 Peripheral vascular disease, unspecified: Secondary | ICD-10-CM

## 2015-11-13 ENCOUNTER — Other Ambulatory Visit: Payer: Self-pay | Admitting: Radiation Oncology

## 2015-11-13 DIAGNOSIS — C3492 Malignant neoplasm of unspecified part of left bronchus or lung: Secondary | ICD-10-CM

## 2015-11-22 ENCOUNTER — Encounter: Payer: Self-pay | Admitting: Vascular Surgery

## 2015-11-22 ENCOUNTER — Encounter (HOSPITAL_COMMUNITY)
Admission: RE | Admit: 2015-11-22 | Discharge: 2015-11-22 | Disposition: A | Discharge status: Home / Self Care | Payer: Medicare Other | Source: Ambulatory Visit | Attending: Radiation Oncology | Admitting: Radiation Oncology

## 2015-11-22 DIAGNOSIS — C3492 Malignant neoplasm of unspecified part of left bronchus or lung: Secondary | ICD-10-CM | POA: Diagnosis present

## 2015-11-22 LAB — GLUCOSE, CAPILLARY: Glucose-Capillary: 151 mg/dL — ABNORMAL HIGH (ref 65–99)

## 2015-11-22 MED ORDER — FLUDEOXYGLUCOSE F - 18 (FDG) INJECTION
9.9300 | Freq: Once | INTRAVENOUS | Status: AC | PRN
  Administered 2015-11-22: 9.93 via INTRAVENOUS

## 2015-11-23 ENCOUNTER — Encounter: Payer: Medicare Other | Admitting: Vascular Surgery

## 2015-11-23 ENCOUNTER — Encounter (HOSPITAL_COMMUNITY): Payer: Medicare Other

## 2015-11-26 ENCOUNTER — Ambulatory Visit (HOSPITAL_COMMUNITY): Payer: Medicare Other

## 2015-11-30 ENCOUNTER — Encounter: Payer: Self-pay | Admitting: Vascular Surgery

## 2015-11-30 ENCOUNTER — Ambulatory Visit (HOSPITAL_COMMUNITY): Admission: RE | Admit: 2015-11-30 | Payer: Medicare Other | Source: Ambulatory Visit

## 2015-11-30 ENCOUNTER — Ambulatory Visit (INDEPENDENT_AMBULATORY_CARE_PROVIDER_SITE_OTHER): Payer: Medicare Other | Admitting: Vascular Surgery

## 2015-11-30 VITALS — BP 151/78 | HR 63 | Temp 97.4°F | Resp 18 | Ht 70.0 in | Wt 176.0 lb

## 2015-11-30 DIAGNOSIS — I70213 Atherosclerosis of native arteries of extremities with intermittent claudication, bilateral legs: Secondary | ICD-10-CM | POA: Diagnosis not present

## 2015-11-30 DIAGNOSIS — I70219 Atherosclerosis of native arteries of extremities with intermittent claudication, unspecified extremity: Secondary | ICD-10-CM | POA: Insufficient documentation

## 2015-11-30 NOTE — Progress Notes (Signed)
Referred by:  Cyndi Bender, PA-C [place], [place] [address]  Reason for referral: right calf cramping  History of Present Illness  Brandon Clay is a 81 y.o. (03-02-1935) male who presents with chief complaint: right calf cramping.  Onset of symptom occurred years ago.  His recollection of his leg problems is limited.  Wife suggests pain has been in place for multiple year..  Pain is described as cramping in right calf, severity 5-10/10, and associated with walking up inclines.  Patient has attempted to treat this pain with rest.  The patient has no rest pain symptoms also and no leg wounds/ulcers.  Atherosclerotic risk factors include: HTN, HLD, DM, and history of smoking.  Pt thinks his sx do interfere with ADLs.  Family does not agree.   Past Medical History  Diagnosis Date  . Hypertension   . Hypercholesterolemia     statin intolerant  . Diabetes mellitus, type 2 ([place])   . Benign prostatic hypertrophy   . Emphysema   . COPD (chronic obstructive pulmonary disease) ([place])   . CKD (chronic kidney disease)   . Colitis   . Coronary artery disease     Cath 07/02/12 3v CAD w/ chronic occlusion of RCA (fills from left collaterals), severe stenosis prox LCx & moderately severe dz in mid LAD; s/p PTCA/BMS to prox LCx 07/12/12   . Normocytic anemia   . Persistent atrial fibrillation ([place])   . Atrial enlargement, left   . Hx of radiation therapy 08/23/12- 09/07/12    left upper chest region  . 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 Hemorrhoid surgery    . Tee without cardioversion N/A 07/29/2012    Procedure: TRANSESOPHAGEAL ECHOCARDIOGRAM (TEE);  Surgeon: Larey Dresser, MD;  Location: [facility] ENDOSCOPY;  Service: Cardiovascular;  Laterality: N/A;  patient coming from [place] long room 1236 talk to jennifer at [place]  talk to [facility] from care link will pick up pat. at 8:15 from [place]   . Cardioversion N/A 07/29/2012    Procedure: CARDIOVERSION;  Surgeon: Larey Dresser, MD;  Location: [place];  Service: Cardiovascular;  Laterality: N/A;  . Cardiac catheterization    . Coronary angioplasty with stent placement    . Lung biopsy    . Percutaneous coronary stent intervention (pci-s) N/A 07/12/2012    Procedure: PERCUTANEOUS CORONARY STENT INTERVENTION (PCI-S);  Surgeon: Burnell Blanks, MD;  Location: [facility] CATH LAB;  Service: Cardiovascular;  Laterality: N/A;    Social History   Social History  . Marital Status: Married    Spouse Name: N/A  . Number of Children: 1  . Years of Education: N/A   Occupational History  . retired [employer] for over 20 years  . retired     Clinical biochemist for 10 years   Social History Main Topics  . Smoking status: Former Smoker -- 0.50 packs/day for 50 years    Types: Cigarettes    Start date: 06/09/1958    Quit date: 06/10/2007  . Smokeless tobacco: Never Used  . Alcohol Use: No  . Drug Use: No  . Sexual Activity: No   Other Topics Concern  . Not on file   Social History Narrative    Family History  Problem Relation Age of Onset  . Stroke Mother   . Heart disease Father  died of heart attack  . Lung cancer Brother   . Hypertension Brother   . Cancer Brother     brain cancer  . Hypothyroidism Brother   . Hyperlipidemia Sister   . Hyperlipidemia Sister     Current Outpatient Prescriptions  Medication Sig Dispense Refill  . aspirin EC 81 MG tablet Take 81 mg by mouth every other day.    . digoxin (LANOXIN) 0.125 MG tablet Take 1 tablet (0.125 mg total) by mouth daily. 30 tablet 0  . furosemide (LASIX) 20 MG tablet Take 20 mg by mouth 2 (two) times daily.     . insulin glargine (LANTUS) 100 UNIT/ML injection Inject 16 Units into the skin 2 (two) times daily.    Marland Kitchen lisinopril (PRINIVIL,ZESTRIL) 10 MG tablet     . metFORMIN (GLUCOPHAGE-XR) 500 MG 24 hr tablet     . metoprolol  tartrate (LOPRESSOR) 25 MG tablet Take 1 tablet (25 mg total) by mouth 2 (two) times daily. (Patient taking differently: Take 12.5 mg by mouth 2 (two) times daily. ) 60 tablet 0  . Multiple Vitamin (MULTIVITAMIN WITH MINERALS) TABS Take 1 tablet by mouth daily. 30 tablet 0  . potassium chloride SA (K-DUR,KLOR-CON) 20 MEQ tablet Take 20 mEq by mouth daily.     Marland Kitchen warfarin (COUMADIN) 1 MG tablet Take 9 mg by mouth daily. Takes 7 mg Monday and Thursday, 8 mg all other days    . insulin detemir (LEVEMIR) 100 UNIT/ML injection Inject 5 Units into the skin 2 (two) times daily. Reported on 11/30/2015    . NOVOLOG FLEXPEN 100 UNIT/ML SOPN FlexPen Reported on 11/30/2015    . Tamsulosin HCl (FLOMAX) 0.4 MG CAPS Take 0.4 mg by mouth every morning. Reported on 11/30/2015     No current facility-administered medications for this visit.    No Known Allergies   REVIEW OF SYSTEMS:  (Positives checked otherwise negative)  CARDIOVASCULAR:   '[ ]'$  chest pain,  '[ ]'$  chest pressure,  '[ ]'$  palpitations,  '[ ]'$  shortness of breath when laying flat,  '[x]'$  shortness of breath with exertion,   '[x]'$  pain in feet when walking,  '[ ]'$  pain in feet when laying flat, '[ ]'$  history of blood clot in veins (DVT),  '[ ]'$  history of phlebitis,  '[ ]'$  swelling in legs,  '[ ]'$  varicose veins  PULMONARY:   '[ ]'$  productive cough,  '[ ]'$  asthma,  '[ ]'$  wheezing  NEUROLOGIC:   '[x]'$  weakness in arms or legs,  '[ ]'$  numbness in arms or legs,  '[ ]'$  difficulty speaking or slurred speech,  '[ ]'$  temporary loss of vision in one eye,  '[ ]'$  dizziness  HEMATOLOGIC:   '[ ]'$  bleeding problems,  '[ ]'$  problems with blood clotting too easily  MUSCULOSKEL:   '[x]'$  joint pain, '[ ]'$  joint swelling  GASTROINTEST:   '[ ]'$  vomiting blood,  '[ ]'$  blood in stool     GENITOURINARY:   '[ ]'$  burning with urination,  '[ ]'$  blood in urine  PSYCHIATRIC:   '[ ]'$  history of major depression  INTEGUMENTARY:   '[ ]'$  rashes,  '[ ]'$  ulcers  CONSTITUTIONAL:   '[ ]'$  fever,  '[ ]'$   chills   For VQI Use Only  PRE-ADM LIVING: Home  AMB STATUS: Ambulatory  CAD Sx: History of MI, but no symptoms No MI within 6 months  PRIOR CHF: Mild  STRESS TEST: [x ] No, '[ ]'$  Normal, '[ ]'$  + ischemia, '[ ]'$  +  MI, '[ ]'$  Both   Physical Examination  Filed Vitals:   11/30/15 0954 11/30/15 1001  BP: 160/77 151/78  Pulse: 64 63  Temp: 97.4 F (36.3 C)   Resp: 18   Height: '5\' 10"'$  (1.778 m)   Weight: 176 lb (79.833 kg)   SpO2: 95%    Body mass index is 25.25 kg/(m^2).  General: A&O x 3, WD, thin, elderly  Head: [place]/AT  Ear/Nose/Throat: Hearing grossly intact, nares without erythema or drainage, oropharynx without Erythema/Exudate, Mallampati score: 3  Eyes: PERRLA, EOMI  Neck: Supple, no nuchal rigidity, no palpable LAD  Pulmonary: Sym exp, good air movt, CTAB, no rales, rhonchi, & wheezing  Cardiac: RRR, Nl S1, S2, no Murmurs, rubs or gallops  Vascular: Vessel Right Left  Radial Not Palpable Not Palpable  Brachial Faintly Palpable Faintly Palpable  Carotid Palpable, without bruit Palpable, without bruit  Aorta Not palpable N/A  Femoral Palpable Palpable  Popliteal Not palpable Not palpable  PT Not Palpable Not Palpable  DP Not Palpable Not Palpable   Gastrointestinal: soft, NTND, no G/R, no HSM, no masses, no CVAT B  Musculoskeletal: M/S 5/5 throughout , Extremities without ischemic changes, some dependent rubor, some toe cyanosis, mild LDS, mild edema BLE  Neurologic: CN 2-12 intact , Pain and light touch intact in extremities , Motor exam as listed above  Psychiatric: Judgment intact, Mood & affect appropriate for pt's clinical situation  Dermatologic: See M/S exam for extremity exam, no rashes otherwise noted  Lymph : No Cervical, Axillary, or Inguinal lymphadenopathy    Non-Invasive Vascular Imaging  ABI (Date: 10/12/15)  R:   ABI: 1.27,   DP: damp. mono,   PT: damp. mono,   L:   ABI: 1.49,   DP: damp. mono,   PT: damp. mono,     Outside Studies/Documentation 10 pages of outside documents were reviewed including: outpatient PCP    Medical Decision Making  Brandon Clay is a 81 y.o. male who presents with: RLE intermittent claudication, minimally moderate BLE PAD, CAD   I don't have access to waveforms to review the study, but the report suggests minimally moderate BLE PAD if not severe disease in a classic diabetic pattern.  I discussed the options with the patient, at this point, he agree to maximal medical mgmt with walking plan for 6 months before considering angiography.  Based on this patient's history and physical exam, I recommend: maximal medical mgmt and ABI in 3 months  I discussed with the patient the natural history of intermittent claudication: 75% of patients have stable or improved symptoms in a year an only 2% require amputation. Eventually 20% may require intervention in a year.  I discussed in depth with the patient the nature of atherosclerosis, and emphasized the importance of maximal medical management including strict control of blood pressure, blood glucose, and lipid levels, antiplatelet agent, obtaining regular exercise, and cessation of smoking.    The patient is aware that without maximal medical management the underlying atherosclerotic disease process will progress, limiting the benefit of any interventions.  I discussed in depth with the patient a walking plan and how to execute such.  The patient is not a candidate for Pletal as pt is on Digoxin for heart failure. The patient is currently not on a statin: reported intolerance.  Would try a different low dose statin to see if this would be better tolerated. The patient is currently on an anti-platelet: ASA.  Thank you for allowing [place] to participate  in this patient's care.   Adele Barthel, MD [facility] Office: [phone] Pager: [phone]  11/30/2015, 10:54 AM

## 2015-12-05 ENCOUNTER — Other Ambulatory Visit: Payer: Self-pay | Admitting: Oncology

## 2015-12-05 ENCOUNTER — Other Ambulatory Visit: Payer: Self-pay | Admitting: Radiation Oncology

## 2015-12-05 ENCOUNTER — Telehealth: Payer: Self-pay | Admitting: Oncology

## 2015-12-05 DIAGNOSIS — C3492 Malignant neoplasm of unspecified part of left bronchus or lung: Secondary | ICD-10-CM

## 2015-12-05 NOTE — Telephone Encounter (Signed)
Brandon Clay's daughter, [name], called and asked for the results from his recent scan.  She requested a call back at [phone].

## 2015-12-19 ENCOUNTER — Other Ambulatory Visit: Payer: Self-pay | Admitting: *Deleted

## 2015-12-19 ENCOUNTER — Encounter: Payer: Self-pay | Admitting: Cardiothoracic Surgery

## 2015-12-19 ENCOUNTER — Institutional Professional Consult (permissible substitution) (INDEPENDENT_AMBULATORY_CARE_PROVIDER_SITE_OTHER): Payer: Medicare Other | Admitting: Cardiothoracic Surgery

## 2015-12-19 VITALS — BP 143/74 | HR 65 | Resp 20 | Ht 70.0 in | Wt 175.0 lb

## 2015-12-19 DIAGNOSIS — R911 Solitary pulmonary nodule: Secondary | ICD-10-CM

## 2015-12-19 DIAGNOSIS — Z85118 Personal history of other malignant neoplasm of bronchus and lung: Secondary | ICD-10-CM | POA: Diagnosis not present

## 2015-12-19 DIAGNOSIS — C3412 Malignant neoplasm of upper lobe, left bronchus or lung: Secondary | ICD-10-CM

## 2015-12-19 NOTE — Progress Notes (Signed)
[address]       [place] [address]             [phone]                    [name] Wales Medical Record #[id number] Date of Birth: Feb 27, 1935  Referring: Gery Pray, MD Primary Care: Fae Pippin  Chief Complaint:    Chief Complaint  Patient presents with  . Lung Lesion    Surgical eval, Chest CT 11/01/15, PET Scan 11/22/15, HX of lung cancer    History of Present Illness:    Brandon Clay 81 y.o. male is seen in the office  today for abnormal PET scan. Patient known from 2014 when he presented with CAD, left lung mass, severe pulmonary disease and  Recurrent Cdiff. His functional status was poor. He was treated with coronary stent and stereotactic radiotherapy  To left upper lobe for stage I non small cell carcinoma. He has never gone back to cardiology. Since radiation therapy he has been followed with interval CT of chest. Enlarging area left upper lobe was noted and PET scan done.  Overall patients functional status is improved compared to 2014, he had fecal transplant and has not had further bouts of Cdiff colitis . He walks daily but still is limited by SOB. He has claudication rt> then left    He is on chronic coumadin for AFIB but has had no cardiology follow up since 2014.  Current Activity/ Functional Status:  Patient is independent with mobility/ambulation, transfers, ADL's, IADL's.   Zubrod Score: At the time of surgery this patient's most appropriate activity status/level should be described as: '[]'$     0    Normal activity, no symptoms '[]'$     1    Restricted in physical strenuous activity but ambulatory, able to do out light work '[x]'$     2    Ambulatory and capable of self care, unable to do work activities, up and about               >50 % of waking hours                              '[]'$     3    Only limited self care, in bed greater than 50% of waking hours '[]'$     4    Completely disabled, no self care, confined to  bed or chair '[]'$     5    Moribund   Past Medical History  Diagnosis Date  . Hypertension   . Hypercholesterolemia     statin intolerant  . Diabetes mellitus, type 2 ([place])   . Benign prostatic hypertrophy   . Emphysema   . COPD (chronic obstructive pulmonary disease) ([place])   . CKD (chronic kidney disease)   . Colitis   . Coronary artery disease     Cath 07/02/12 3v CAD w/ chronic occlusion of RCA (fills from left collaterals), severe stenosis prox LCx & moderately severe dz in mid LAD; s/p PTCA/BMS to prox LCx 07/12/12   . Normocytic anemia   . Persistent atrial fibrillation ([place])   . Atrial enlargement, left   . Hx of radiation therapy 08/23/12- 09/07/12    left upper chest region  . 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 Hemorrhoid surgery    . Tee without cardioversion N/A 07/29/2012    Procedure: TRANSESOPHAGEAL ECHOCARDIOGRAM (TEE);  Surgeon: Larey Dresser, MD;  Location: [facility] ENDOSCOPY;  Service: Cardiovascular;  Laterality: N/A;  patient coming from [place] long room 1236 talk to jennifer at [place]  talk to [facility] from care link will pick up pat. at 8:15 from [place]  . Cardioversion N/A 07/29/2012    Procedure: CARDIOVERSION;  Surgeon: Larey Dresser, MD;  Location: [place];  Service: Cardiovascular;  Laterality: N/A;  . Cardiac catheterization    . Coronary angioplasty with stent placement    . Lung biopsy    . Percutaneous coronary stent intervention (pci-s) N/A 07/12/2012    Procedure: PERCUTANEOUS CORONARY STENT INTERVENTION (PCI-S);  Surgeon: Burnell Blanks, MD;  Location: [facility] CATH LAB;  Service: Cardiovascular;  Laterality: N/A;    Family History  Problem Relation Age of Onset  . Stroke Mother   . Heart disease Father     died of heart attack  . Lung cancer Brother   . Hypertension Brother   . Cancer Brother     brain cancer  . Hypothyroidism Brother   .  Hyperlipidemia Sister   . Hyperlipidemia Sister     Social History   Social History  . Marital Status: Married    Spouse Name: N/A  . Number of Children: 1  . Years of Education: N/A   Occupational History  . retired [employer] for over 20 years  . retired     Clinical biochemist for 10 years   Social History Main Topics  . Smoking status: Former Smoker -- 0.50 packs/day for 50 years    Types: Cigarettes    Start date: 06/09/1958    Quit date: 06/10/2007  . Smokeless tobacco: Never Used  . Alcohol Use: No  . Drug Use: No  . Sexual Activity: No   Other Topics Concern  . Not on file   Social History Narrative    History  Smoking status  . Former Smoker -- 0.50 packs/day for 50 years  . Types: Cigarettes  . Start date: 06/09/1958  . Quit date: 06/10/2007  Smokeless tobacco  . Never Used    History  Alcohol Use No     No Known Allergies  Current Outpatient Prescriptions  Medication Sig Dispense Refill  . aspirin EC 81 MG tablet Take 81 mg by mouth every other day.    . digoxin (LANOXIN) 0.125 MG tablet Take 1 tablet (0.125 mg total) by mouth daily. 30 tablet 0  . furosemide (LASIX) 20 MG tablet Take 20 mg by mouth 2 (two) times daily.     . insulin glargine (LANTUS) 100 UNIT/ML injection Inject 16 Units into the skin 2 (two) times daily.    Marland Kitchen lisinopril (PRINIVIL,ZESTRIL) 10 MG tablet     . metFORMIN (GLUCOPHAGE-XR) 500 MG 24 hr tablet     . metoprolol tartrate (LOPRESSOR) 25 MG tablet Take 1 tablet (25 mg total) by mouth 2 (two) times daily. (Patient taking differently: Take 12.5 mg by mouth 2 (two) times daily. ) 60 tablet 0  . Multiple Vitamin (MULTIVITAMIN WITH MINERALS) TABS Take 1 tablet by mouth daily. 30 tablet 0  . potassium chloride SA (K-DUR,KLOR-CON) 20 MEQ tablet Take 20 mEq by mouth daily.     Marland Kitchen warfarin (COUMADIN) 1 MG tablet Take 9 mg by mouth daily. Takes 7 mg Monday and Thursday, 8 mg all  other days     No current  facility-administered medications for this visit.      Review of Systems:     Cardiac Review of Systems: Y or N  Chest Pain [  n  ]  Resting SOB [ y  ] Exertional SOB  Blue.Reese  ]  Vertell Limber Florencio.Farrier  ]   Pedal Edema [ n  ]    Palpitations Blue.Reese  ] Syncope  Florencio.Farrier  ]   Presyncope [ n ]  General Review of Systems: [Y] = yes [  ]=no Constitional: recent weight change [  ];  Wt loss over the last 3 months [   ] anorexia [  ]; fatigue [  ]; nausea [  ]; night sweats [  ]; fever [  ]; or chills [  ];          Dental: poor dentition[  ]; Last Dentist visit:   Eye : blurred vision [  ]; diplopia [   ]; vision changes [  ];  Amaurosis fugax[  ]; Resp: cough [  ];  wheezing[  ];  hemoptysis[  ]; shortness of breath[  ]; paroxysmal nocturnal dyspnea[  ]; dyspnea on exertion[  ]; or orthopnea[  ];  GI:  gallstones[  ], vomiting[  ];  dysphagia[  ]; melena[  ];  hematochezia [  ]; heartburn[  ];   Hx of  Colonoscopy[  ]; GU: kidney stones [  ]; hematuria[  ];   dysuria [  ];  nocturia[  ];  history of     obstruction [  ]; urinary frequency [  ]             Skin: rash, swelling[  ];, hair loss[  ];  peripheral edema[  ];  or itching[  ]; Musculosketetal: myalgias[  ];  joint swelling[  ];  joint erythema[  ];  joint pain[  ];  back pain[  ];  Heme/Lymph: bruising[  ];  bleeding[  ];  anemia[  ];  Neuro: TIA[  ];  headaches[  ];  stroke[  ];  vertigo[  ];  seizures[  ];   paresthesias[  ];  difficulty walking[  ];  Psych:depression[  ]; anxiety[  ];  Endocrine: diabetes[  ];  thyroid dysfunction[  ];  Immunizations: Flu up to date [  ]; Pneumococcal up to date [  ];  Other:  Physical Exam: BP 143/74 mmHg  Pulse 65  Resp 20  Ht '5\' 10"'$  (1.778 m)  Wt 175 lb (79.379 kg)  BMI 25.11 kg/m2  SpO2 93%  PHYSICAL EXAMINATION: General appearance: alert, cooperative and no distress Head: Normocephalic, without obvious abnormality, atraumatic Neck: no adenopathy, no carotid bruit, no JVD, supple, symmetrical, trachea  midline and thyroid not enlarged, symmetric, no tenderness/mass/nodules Lymph nodes: Cervical, supraclavicular, and axillary nodes normal. Resp: diminished breath sounds bibasilar Back: symmetric, no curvature. ROM normal. No CVA tenderness. Cardio: irregularly irregular rhythm GI: soft, non-tender; bowel sounds normal; no masses,  no organomegaly Extremities: no palpable pulses pt or dp bilaterial, recent dopplers done vascular office  Neurologic: Grossly normal  Diagnostic Studies & Laboratory data:     Recent Radiology Findings:   Nm Pet Image Restag (ps) Skull Base To Thigh  11/22/2015  CLINICAL DATA:  Subsequent treatment strategy for restaging of left-sided lung cancer. EXAM: NUCLEAR MEDICINE PET SKULL BASE TO THIGH TECHNIQUE: 9.9 mCi F-18 FDG was injected intravenously. Full-ring PET imaging was performed from the skull base  to thigh after the radiotracer. CT data was obtained and used for attenuation correction and anatomic localization. FASTING BLOOD GLUCOSE:  Value: 151 mg/dl COMPARISON:  Chest CTs, most recent 11/01/2015. FINDINGS: NECK No areas of abnormal hypermetabolism. CHEST Relatively diffuse hypermetabolism about anterior left-sided pleural and chest wall thickening. This measures a S.U.V. max of 6.1, including on image 67/series 4. This combination of pleural and chest wall soft tissue thickening measures on the order of 2.6 cm on image 66/series 4 today and is similar to 11/01/2015. Minimally increased from 2.4 cm on 05/10/2015. Focal hypermetabolism corresponds to the left upper lobe nodule, positioned about the inferior and lateral aspect of presumed radiation change. This measures 2.2 x 2.2 cm and a S.U.V. max of 6.0 on image 28/series 2. Right paratracheal node measures 9 mm and a S.U.V. max of 3.7, similar to the mediastinal pool. This node is similar in size back to 05/10/2015. Subcarinal node is normal sized at 7 mm and a S.U.V. max of 3.9 today. ABDOMEN/PELVIS No areas of  abnormal hypermetabolism. SKELETON No abnormal marrow activity. CT IMAGES PERFORMED FOR ATTENUATION CORRECTION Cerebral atrophy. No cervical adenopathy. Chest findings deferred to recent diagnostic CT. Mild cardiomegaly with multivessel coronary artery atherosclerosis. Pulmonary artery enlargement, including a 3.0 cm right pulmonary artery. Centrilobular emphysema. Bibasilar subsegmental atelectasis. Mild renal cortical thinning bilaterally. Probable lower pole left renal cyst. Renal vascular calcifications bilaterally. Cholelithiasis, with stones on the order of 1.3 cm. Fat containing left larger than right inguinal hernias. Left second and third rib fractures again identified and favored to related to radiation necrosis. A mild superior endplate L3 compression deformity is of indeterminate acuity. New since 11/26/2012 plain films. IMPRESSION: 1. The left upper lobe nodule, at the inferior aspect of radiation fibrosis, is hypermetabolic, suspicious for locally recurrent disease. Consider tissue sampling. 2. Thoracic nodes are small and demonstrate low-level hypermetabolism, similar to mediastinal pool. Indeterminate. 3. Hypermetabolism about an area of left-sided pleural and chest wall soft tissue thickening is favored to be related to radiation change, given its relatively diffuse appearance. This has been slightly progressive over prior exams and warrants special followup attention. 4. Pulmonary artery enlargement suggests pulmonary arterial hypertension. 5.  Atherosclerosis, including within the coronary arteries. 6. Mild L3 compression deformity. 7. Cholelithiasis. Electronically Signed   By: [name] M.D.   On: 11/22/2015 14:50     I have independently reviewed the above radiologic studies.  Recent Lab Findings: Lab Results  Component Value Date   WBC 9.0 09/17/2012   HGB 8.8* 09/17/2012   HCT 27.1* 09/17/2012   PLT 182 09/17/2012   GLUCOSE 205* 09/17/2012   ALT 28 09/11/2012   AST 43*  09/11/2012   NA 135 09/17/2012   K 4.6 09/17/2012   CL 104 09/17/2012   CREATININE 1.5* 06/13/2014   BUN 30.5* 06/13/2014   CO2 26 09/17/2012   TSH 0.845 09/03/2012   INR 2.19* 09/17/2012   HGBA1C 6.7* 09/02/2012      Assessment / Plan:   Question of recurrent non small cell cancer in left upper lobe  And or left upper chest wall. Findings discussed with patient daughter and wife. Will hold coumadin for ct guided needle biopsy , with needle biopsy of left upper lobe lesion and left chest wall hyper metabolic. With the patients previous history and underlying pulmonary disease, would be poor operative candidate.    I  spent 30 minutes counseling the patient face to face and 50% or more the  time was  spent in counseling and coordination of care. The total time spent in the appointment was 45 minutes.  Grace Isaac MD      [place] [place] [address] Office [phone]   [place] [phone]  12/19/2015 4:24 PM

## 2015-12-28 ENCOUNTER — Other Ambulatory Visit: Payer: Self-pay | Admitting: Radiology

## 2015-12-31 ENCOUNTER — Other Ambulatory Visit: Payer: Self-pay | Admitting: Cardiothoracic Surgery

## 2015-12-31 ENCOUNTER — Ambulatory Visit (HOSPITAL_COMMUNITY)
Admission: RE | Admit: 2015-12-31 | Discharge: 2015-12-31 | Disposition: A | Discharge status: Home / Self Care | Payer: Medicare Other | Source: Ambulatory Visit | Attending: Cardiothoracic Surgery | Admitting: Cardiothoracic Surgery

## 2015-12-31 ENCOUNTER — Encounter (HOSPITAL_COMMUNITY): Payer: Self-pay

## 2015-12-31 ENCOUNTER — Ambulatory Visit (HOSPITAL_COMMUNITY)
Admission: RE | Admit: 2015-12-31 | Discharge: 2015-12-31 | Disposition: A | Discharge status: Home / Self Care | Payer: Medicare Other | Source: Ambulatory Visit | Attending: Diagnostic Radiology | Admitting: Diagnostic Radiology

## 2015-12-31 DIAGNOSIS — Z85118 Personal history of other malignant neoplasm of bronchus and lung: Secondary | ICD-10-CM | POA: Diagnosis not present

## 2015-12-31 DIAGNOSIS — R918 Other nonspecific abnormal finding of lung field: Secondary | ICD-10-CM | POA: Diagnosis not present

## 2015-12-31 DIAGNOSIS — Z8349 Family history of other endocrine, nutritional and metabolic diseases: Secondary | ICD-10-CM | POA: Diagnosis not present

## 2015-12-31 DIAGNOSIS — J948 Other specified pleural conditions: Secondary | ICD-10-CM

## 2015-12-31 DIAGNOSIS — I2510 Atherosclerotic heart disease of native coronary artery without angina pectoris: Secondary | ICD-10-CM | POA: Diagnosis not present

## 2015-12-31 DIAGNOSIS — I129 Hypertensive chronic kidney disease with stage 1 through stage 4 chronic kidney disease, or unspecified chronic kidney disease: Secondary | ICD-10-CM | POA: Diagnosis not present

## 2015-12-31 DIAGNOSIS — Z7982 Long term (current) use of aspirin: Secondary | ICD-10-CM | POA: Insufficient documentation

## 2015-12-31 DIAGNOSIS — Z823 Family history of stroke: Secondary | ICD-10-CM | POA: Diagnosis not present

## 2015-12-31 DIAGNOSIS — Z8249 Family history of ischemic heart disease and other diseases of the circulatory system: Secondary | ICD-10-CM | POA: Diagnosis not present

## 2015-12-31 DIAGNOSIS — J449 Chronic obstructive pulmonary disease, unspecified: Secondary | ICD-10-CM | POA: Diagnosis not present

## 2015-12-31 DIAGNOSIS — C3492 Malignant neoplasm of unspecified part of left bronchus or lung: Secondary | ICD-10-CM | POA: Insufficient documentation

## 2015-12-31 DIAGNOSIS — Z955 Presence of coronary angioplasty implant and graft: Secondary | ICD-10-CM | POA: Diagnosis not present

## 2015-12-31 DIAGNOSIS — Z87891 Personal history of nicotine dependence: Secondary | ICD-10-CM | POA: Insufficient documentation

## 2015-12-31 DIAGNOSIS — R911 Solitary pulmonary nodule: Secondary | ICD-10-CM

## 2015-12-31 DIAGNOSIS — Z7901 Long term (current) use of anticoagulants: Secondary | ICD-10-CM | POA: Insufficient documentation

## 2015-12-31 DIAGNOSIS — Z801 Family history of malignant neoplasm of trachea, bronchus and lung: Secondary | ICD-10-CM | POA: Diagnosis not present

## 2015-12-31 DIAGNOSIS — Z923 Personal history of irradiation: Secondary | ICD-10-CM | POA: Insufficient documentation

## 2015-12-31 DIAGNOSIS — N189 Chronic kidney disease, unspecified: Secondary | ICD-10-CM | POA: Diagnosis not present

## 2015-12-31 DIAGNOSIS — E7800 Pure hypercholesterolemia, unspecified: Secondary | ICD-10-CM | POA: Diagnosis not present

## 2015-12-31 DIAGNOSIS — Z79899 Other long term (current) drug therapy: Secondary | ICD-10-CM | POA: Insufficient documentation

## 2015-12-31 DIAGNOSIS — R222 Localized swelling, mass and lump, trunk: Secondary | ICD-10-CM | POA: Diagnosis present

## 2015-12-31 DIAGNOSIS — Z9889 Other specified postprocedural states: Secondary | ICD-10-CM

## 2015-12-31 DIAGNOSIS — I481 Persistent atrial fibrillation: Secondary | ICD-10-CM | POA: Insufficient documentation

## 2015-12-31 DIAGNOSIS — Z794 Long term (current) use of insulin: Secondary | ICD-10-CM | POA: Diagnosis not present

## 2015-12-31 DIAGNOSIS — E1122 Type 2 diabetes mellitus with diabetic chronic kidney disease: Secondary | ICD-10-CM | POA: Insufficient documentation

## 2015-12-31 LAB — CBC
HCT: 39.3 % (ref 39.0–52.0)
Hemoglobin: 13.3 g/dL (ref 13.0–17.0)
MCH: 28.7 pg (ref 26.0–34.0)
MCHC: 33.8 g/dL (ref 30.0–36.0)
MCV: 84.9 fL (ref 78.0–100.0)
PLATELETS: 90 10*3/uL — AB (ref 150–400)
RBC: 4.63 MIL/uL (ref 4.22–5.81)
RDW: 15.6 % — ABNORMAL HIGH (ref 11.5–15.5)
WBC: 5.9 10*3/uL (ref 4.0–10.5)

## 2015-12-31 LAB — PROTIME-INR
INR: 1.13 (ref 0.00–1.49)
PROTHROMBIN TIME: 14.7 s (ref 11.6–15.2)

## 2015-12-31 LAB — GLUCOSE, CAPILLARY: Glucose-Capillary: 214 mg/dL — ABNORMAL HIGH (ref 65–99)

## 2015-12-31 LAB — APTT: APTT: 33 s (ref 24–37)

## 2015-12-31 MED ORDER — LIDOCAINE HCL 1 % IJ SOLN
INTRAMUSCULAR | Status: AC
  Filled 2015-12-31: qty 40

## 2015-12-31 MED ORDER — FENTANYL CITRATE (PF) 100 MCG/2ML IJ SOLN
INTRAMUSCULAR | Status: AC | PRN
  Administered 2015-12-31: 50 ug via INTRAVENOUS

## 2015-12-31 MED ORDER — MIDAZOLAM HCL 2 MG/2ML IJ SOLN
INTRAMUSCULAR | Status: AC
  Filled 2015-12-31: qty 2

## 2015-12-31 MED ORDER — MIDAZOLAM HCL 2 MG/2ML IJ SOLN
INTRAMUSCULAR | Status: AC | PRN
  Administered 2015-12-31: 1 mg via INTRAVENOUS

## 2015-12-31 MED ORDER — SODIUM CHLORIDE 0.9 % IV SOLN: Freq: Once | INTRAVENOUS | Status: DC

## 2015-12-31 MED ORDER — FENTANYL CITRATE (PF) 100 MCG/2ML IJ SOLN
INTRAMUSCULAR | Status: AC
  Filled 2015-12-31: qty 2

## 2015-12-31 MED ORDER — HYDROCODONE-ACETAMINOPHEN 5-325 MG PO TABS: 1.0000 | ORAL_TABLET | ORAL | Status: DC | PRN

## 2015-12-31 NOTE — Sedation Documentation (Signed)
Patient is resting comfortably. 

## 2015-12-31 NOTE — Sedation Documentation (Signed)
Patient denies pain and is resting comfortably.  

## 2015-12-31 NOTE — Procedures (Signed)
CT guided core biopsies of left upper lobe lesion, 2 cores obtained.   CT guided core biopsies of left chest wall/pleural thickening, 4 cores obtained.   No immediate complication. Minimal blood loss.

## 2015-12-31 NOTE — Sedation Documentation (Signed)
NS bolus during procedure for BP support.

## 2015-12-31 NOTE — H&P (Signed)
Chief Complaint: left lung mass and chest wall mass  Referring Physician:Dr. Lanelle Bal  Supervising Physician: Markus Daft  Patient Status: Out-pt  HPI: Brandon Clay is an 81 y.o. male with a history of Stage I lung cancer.  He was seen by Dr. Servando Snare in 2014.  At that time, he was treated with radiation as he was not in good health due to other medical conditions.  He has since improved, but on follow up CT scans as well as a new PET scan, it appears he has a left chest wall mass and LUL lung mass that are concerning for recurrence.  He saw Dr. Servando Snare recently and a request for a biopsy has been made.  The patient state he is feeling well and in his normal state of health.  He denies CP, SOB, cough, fevers, HA, chills.  He does take coumadin for A. Fib but this has been held for the last 5 days.    Past Medical History:  Past Medical History:  Diagnosis Date  . Atrial enlargement, left   . Benign prostatic hypertrophy   . CKD (chronic kidney disease)   . Colitis   . COPD (chronic obstructive pulmonary disease) ([place])   . Coronary artery disease    Cath 07/02/12 3v CAD w/ chronic occlusion of RCA (fills from left collaterals), severe stenosis prox LCx & moderately severe dz in mid LAD; s/p PTCA/BMS to prox LCx 07/12/12   . Diabetes mellitus, type 2 ([place])   . Emphysema   . Hx of radiation therapy 08/23/12- 09/07/12   left upper chest region  . Hypercholesterolemia    statin intolerant  . Hypertension   . Lung cancer ([place])    Followed by Dr. Servando Snare (LUL squamous cell carcinoma)  . Normocytic anemia   . Persistent atrial fibrillation [facility])     Past Surgical History:  Past Surgical History:  Procedure Laterality Date  . CARDIAC CATHETERIZATION    . CARDIOVERSION N/A 07/29/2012   Procedure: CARDIOVERSION;  Surgeon: Larey Dresser, MD;  Location: [place];  Service: Cardiovascular;  Laterality: N/A;  . CORONARY ANGIOPLASTY WITH STENT PLACEMENT    . HEMORRHOID  SURGERY    . LUNG BIOPSY    . PERCUTANEOUS CORONARY STENT INTERVENTION (PCI-S) N/A 07/12/2012   Procedure: PERCUTANEOUS CORONARY STENT INTERVENTION (PCI-S);  Surgeon: Burnell Blanks, MD;  Location: [facility] CATH LAB;  Service: Cardiovascular;  Laterality: N/A;  . TEE WITHOUT CARDIOVERSION N/A 07/29/2012   Procedure: TRANSESOPHAGEAL ECHOCARDIOGRAM (TEE);  Surgeon: Larey Dresser, MD;  Location: [facility] ENDOSCOPY;  Service: Cardiovascular;  Laterality: N/A;  patient coming from [place] long room 1236 talk to jennifer at [facility]  talk to [facility] from care link will pick up pat. at 8:15 from [place]    Family History:  Family History  Problem Relation Age of Onset  . Stroke Mother   . Heart disease Father     died of heart attack  . Lung cancer Brother   . Hypertension Brother   . Cancer Brother     brain cancer  . Hypothyroidism Brother   . Hyperlipidemia Sister   . Hyperlipidemia Sister     Social History:  reports that he quit smoking about 8 years ago. His smoking use included Cigarettes. He started smoking about 57 years ago. He has a 25.00 pack-year smoking history. He has never used smokeless tobacco. He reports that he does not drink alcohol or use drugs.  Allergies: No Known Allergies  Medications:  Medication List    ASK your doctor about these medications   aspirin EC 81 MG tablet Take 81 mg by mouth every other day.   digoxin 0.125 MG tablet Commonly known as:  LANOXIN Take 1 tablet (0.125 mg total) by mouth daily.   furosemide 20 MG tablet Commonly known as:  LASIX Take 20 mg by mouth daily.   insulin glargine 100 UNIT/ML injection Commonly known as:  LANTUS Inject 16 Units into the skin 2 (two) times daily.   lisinopril 10 MG tablet Commonly known as:  PRINIVIL,ZESTRIL Take 10 mg by mouth daily.   metFORMIN 500 MG 24 hr tablet Commonly known as:  GLUCOPHAGE-XR Take 500 mg by mouth daily with breakfast.   metoprolol tartrate 25 MG tablet Commonly known as:   LOPRESSOR Take 1 tablet (25 mg total) by mouth 2 (two) times daily.   multivitamin with minerals Tabs tablet Take 1 tablet by mouth daily.   potassium chloride SA 20 MEQ tablet Commonly known as:  K-DUR,KLOR-CON Take 20 mEq by mouth daily.   warfarin 5 MG tablet Commonly known as:  COUMADIN Take 7.5-10 mg by mouth See admin instructions. Mon and Fri 7.5 mg, 10 mg all other days       Please HPI for pertinent positives, otherwise complete 10 system ROS negative.  Mallampati Score: MD Evaluation Airway: WNL Heart: WNL Abdomen: WNL Chest/ Lungs: WNL ASA  Classification: 3 Mallampati/Airway Score: Two  Physical Exam: BP (!) 164/79   Pulse 60   Temp 98 F (36.7 C)   Resp 18   Ht '5\' 10"'$  (1.778 m)   Wt 175 lb (79.4 kg)   SpO2 96%   BMI 25.11 kg/m  Body mass index is 25.11 kg/m. General: pleasant, WD, WN white male who is laying in bed in NAD HEENT: head is normocephalic, atraumatic.  Sclera are noninjected.  PERRL.  Ears and nose without any masses or lesions.  Mouth is pink and moist Heart: regular, rate, and rhythm.  Normal s1,s2. No obvious murmurs, gallops, or rubs noted.  Palpable radial and pedal pulses bilaterally Lungs: CTAB, no wheezes, rhonchi, or rales noted.  Respiratory effort nonlabored Abd: soft, NT, ND, +BS, no masses, hernias, or organomegaly MS: all 4 extremities are symmetrical with no cyanosis, clubbing, or edema. Psych: A&Ox3 with an appropriate affect.   Labs: Results for orders placed or performed during the hospital encounter of 12/31/15 (from the past 48 hour(s))  Glucose, capillary     Status: Abnormal   Collection Time: 12/31/15  9:54 AM  Result Value Ref Range   Glucose-Capillary 214 (H) 65 - 99 mg/dL  APTT upon arrival     Status: None   Collection Time: 12/31/15 10:00 AM  Result Value Ref Range   aPTT 33 24 - 37 seconds  CBC upon arrival     Status: Abnormal   Collection Time: 12/31/15 10:00 AM  Result Value Ref Range   WBC 5.9 4.0  - 10.5 K/uL   RBC 4.63 4.22 - 5.81 MIL/uL   Hemoglobin 13.3 13.0 - 17.0 g/dL   HCT 39.3 39.0 - 52.0 %   MCV 84.9 78.0 - 100.0 fL   MCH 28.7 26.0 - 34.0 pg   MCHC 33.8 30.0 - 36.0 g/dL   RDW 15.6 (H) 11.5 - 15.5 %   Platelets 90 (L) 150 - 400 K/uL    Comment: REPEATED TO VERIFY SPECIMEN CHECKED FOR CLOTS PLATELET COUNT CONFIRMED BY SMEAR   Protime-INR upon arrival  Status: None   Collection Time: 12/31/15 10:00 AM  Result Value Ref Range   Prothrombin Time 14.7 11.6 - 15.2 seconds   INR 1.13 0.00 - 1.49    Imaging: No results found.  Assessment/Plan 1. Left chest wall and LUL lung mass, hx of lung cancer -we will plan to proceed with a biopsy of both of these lesions. -labs and vitals have been reviewed. -the patient is s/p radiation to this lung.  This will put him at slightly higher risk for bleeding complications.  These have been discussed with the patient and his family. -INR is 1.13. -Risks and Benefits discussed with the patient including, but not limited to bleeding, hemoptysis, respiratory failure requiring intubation, infection, pneumothorax requiring chest tube placement, stroke from air embolism or even death. All of the patient's questions were answered, patient is agreeable to proceed. Consent signed and in chart.   Thank you for this interesting consult.  I greatly enjoyed meeting Brandon Clay and look forward to participating in their care.  A copy of this report was sent to the requesting provider on this date.  Electronically Signed: Henreitta Cea 12/31/2015, 11:04 AM   I spent a total of  30 Minutes   in face to face in clinical consultation, greater than 50% of which was counseling/coordinating care for left chest wall mass and LUL lung mass

## 2015-12-31 NOTE — Discharge Instructions (Signed)

## 2016-01-03 ENCOUNTER — Other Ambulatory Visit: Payer: Self-pay | Admitting: *Deleted

## 2016-01-03 ENCOUNTER — Ambulatory Visit (INDEPENDENT_AMBULATORY_CARE_PROVIDER_SITE_OTHER): Payer: Medicare Other | Admitting: Cardiothoracic Surgery

## 2016-01-03 ENCOUNTER — Encounter: Payer: Self-pay | Admitting: Diagnostic Radiology

## 2016-01-03 ENCOUNTER — Encounter: Payer: Self-pay | Admitting: Cardiothoracic Surgery

## 2016-01-03 VITALS — BP 137/74 | HR 61 | Resp 20 | Ht 70.0 in | Wt 175.0 lb

## 2016-01-03 DIAGNOSIS — Z9889 Other specified postprocedural states: Secondary | ICD-10-CM | POA: Diagnosis not present

## 2016-01-03 DIAGNOSIS — Z85118 Personal history of other malignant neoplasm of bronchus and lung: Secondary | ICD-10-CM

## 2016-01-03 DIAGNOSIS — R911 Solitary pulmonary nodule: Secondary | ICD-10-CM

## 2016-01-03 DIAGNOSIS — C3412 Malignant neoplasm of upper lobe, left bronchus or lung: Secondary | ICD-10-CM | POA: Diagnosis not present

## 2016-01-03 NOTE — Progress Notes (Signed)
Patient ID: Brandon Clay, male   DOB: Sep 04, 1934, 81 y.o.   MRN: [id number] Patient had recent CT guided biopsy of the left chest wall and left lung nodule. I reviewed the pathology results and believe the specimens were mislabeled. The left chest wall biopsy consisted of 4 core samples and left lung nodule had 2 core samples. After reviewing this information with Dr. Lyndon Code in Pathology, we believe the specimens were mislabeled.  Therefore, the left lung nodule is positive for malignancy rather than the left chest wall/pleura. This has been discussed with Dr. Servando Snare.

## 2016-01-03 NOTE — Progress Notes (Signed)
[address]       [place] [address]             [phone]                    Benjie P Trani [place] Medical Record #[id number] Date of Birth: 1935/02/21  Referring: Gery Pray, MD Primary Care: Cyndi Bender, PA-C  Chief Complaint:    Chief Complaint  Patient presents with  . Lung Lesion    f/u to discuss biopsy results, s/p CT BX 12/31/15    History of Present Illness:    Brandon Clay 81 y.o. male is seen in the office  today for abnormal PET scan. Patient known from 2014 when he presented with CAD, left lung mass, severe pulmonary disease and  Recurrent Cdiff. His functional status was poor. He was treated with coronary stent and stereotactic radiotherapy  To left upper lobe for stage I non small cell carcinoma. He has never gone back to cardiology. Since radiation therapy he has been followed with interval CT of chest. Enlarging area left upper lobe was noted and PET scan done.  Overall patients functional status is improved compared to 2014, he had fecal transplant and has not had further bouts of Cdiff colitis . He walks daily but still is limited by SOB. He has claudication rt> then left    He is on chronic coumadin for AFIB but has had no cardiology follow up since 2014.   CT-guided needle biopsy of the left lung lesion and the left chest wall was done several days ago, patient tolerated this well, path report is as below. At the multidisciplinary conference today results were reviewed, after discussing the case with radiology and addendum to the report is to be created as it is the core needle biopsy of the left upper lobe lung lesion which is adenocarcinoma and the needle biopsy of the pleura is dense fibrotic tissue. Radiology and pathology are issuing updated reports concerning this.  Diagnosis 1. Pleura, biopsy, Chest wall, left anterior - ADENOCARCINOMA. - SEE COMMENT. 2. Lung, needle/core biopsy(ies), LUL - DENSE FIBROSIS. -  VIABLE MALIGNANCY IS NOT IDENTIFIED. Microscopic Comment 1. Dr. Vicente Males has reviewed the case and concurs with this interpretation. Dr. Servando Snare was paged on 01/01/16. Additional studies can be performed upon clinician request. (JBK:ds 01/01/16) Enid Cutter MD Pathologist, Electronic Signature (Case signed 01/01/2016) Specimen Gross   Current Activity/ Functional Status:  Patient is independent with mobility/ambulation, transfers, ADL's, IADL's.   Zubrod Score: At the time of surgery this patient's most appropriate activity status/level should be described as: '[]'$     0    Normal activity, no symptoms '[]'$     1    Restricted in physical strenuous activity but ambulatory, able to do out light work '[x]'$     2    Ambulatory and capable of self care, unable to do work activities, up and about               >50 % of waking hours                              '[]'$     3    Only limited self care, in bed greater than 50% of waking hours '[]'$     4    Completely disabled, no self care, confined to bed or chair '[]'$     5  Moribund   Past Medical History:  Diagnosis Date  . Atrial enlargement, left   . Benign prostatic hypertrophy   . CKD (chronic kidney disease)   . Colitis   . COPD (chronic obstructive pulmonary disease) ([place])   . Coronary artery disease    Cath 07/02/12 3v CAD w/ chronic occlusion of RCA (fills from left collaterals), severe stenosis prox LCx & moderately severe dz in mid LAD; s/p PTCA/BMS to prox LCx 07/12/12   . Diabetes mellitus, type 2 ([place])   . Emphysema   . Hx of radiation therapy 08/23/12- 09/07/12   left upper chest region  . Hypercholesterolemia    statin intolerant  . Hypertension   . 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 Normocytic anemia   . Persistent atrial fibrillation [facility])     Past Surgical History:  Procedure Laterality Date  . CARDIAC CATHETERIZATION    . CARDIOVERSION N/A 07/29/2012   Procedure: CARDIOVERSION;   Surgeon: Larey Dresser, MD;  Location: [place];  Service: Cardiovascular;  Laterality: N/A;  . CORONARY ANGIOPLASTY WITH STENT PLACEMENT    . HEMORRHOID SURGERY    . LUNG BIOPSY    . PERCUTANEOUS CORONARY STENT INTERVENTION (PCI-S) N/A 07/12/2012   Procedure: PERCUTANEOUS CORONARY STENT INTERVENTION (PCI-S);  Surgeon: Burnell Blanks, MD;  Location: [facility] CATH LAB;  Service: Cardiovascular;  Laterality: N/A;  . TEE WITHOUT CARDIOVERSION N/A 07/29/2012   Procedure: TRANSESOPHAGEAL ECHOCARDIOGRAM (TEE);  Surgeon: Larey Dresser, MD;  Location: [facility] ENDOSCOPY;  Service: Cardiovascular;  Laterality: N/A;  patient coming from [place] long room 1236 talk to jennifer at [place]  talk to [facility] from care link will pick up pat. at 8:15 from [place]    Family History  Problem Relation Age of Onset  . Stroke Mother   . Heart disease Father     died of heart attack  . Lung cancer Brother   . Hypertension Brother   . Cancer Brother     brain cancer  . Hypothyroidism Brother   . Hyperlipidemia Sister   . Hyperlipidemia Sister     Social History   Social History  . Marital status: Married    Spouse name: N/A  . Number of children: 1  . Years of education: N/A   Occupational History  . retired [employer] for over 20 years  . retired     Clinical biochemist for 10 years   Social History Main Topics  . Smoking status: Former Smoker    Packs/day: 0.50    Years: 50.00    Types: Cigarettes    Start date: 06/09/1958    Quit date: 06/10/2007  . Smokeless tobacco: Never Used  . Alcohol use No  . Drug use: No  . Sexual activity: No   Other Topics Concern  . Not on file   Social History Narrative  . No narrative on file    History  Smoking Status  . Former Smoker  . Packs/day: 0.50  . Years: 50.00  . Types: Cigarettes  . Start date: 06/09/1958  . Quit date: 06/10/2007  Smokeless Tobacco  . Never Used    History  Alcohol Use No     No Known  Allergies  Current Outpatient Prescriptions  Medication Sig Dispense Refill  . aspirin EC 81 MG tablet Take 81 mg by mouth every other day.    . digoxin (LANOXIN) 0.125 MG tablet Take 1 tablet (  0.125 mg total) by mouth daily. (Patient taking differently: Take 0.0625 mg by mouth daily. ) 30 tablet 0  . furosemide (LASIX) 20 MG tablet Take 20 mg by mouth daily.     . insulin glargine (LANTUS) 100 UNIT/ML injection Inject 16 Units into the skin 2 (two) times daily.    Marland Kitchen lisinopril (PRINIVIL,ZESTRIL) 10 MG tablet Take 10 mg by mouth daily.     . metFORMIN (GLUCOPHAGE-XR) 500 MG 24 hr tablet Take 500 mg by mouth daily with breakfast.     . metoprolol tartrate (LOPRESSOR) 25 MG tablet Take 1 tablet (25 mg total) by mouth 2 (two) times daily. (Patient taking differently: Take 12.5 mg by mouth 2 (two) times daily. ) 60 tablet 0  . Multiple Vitamin (MULTIVITAMIN WITH MINERALS) TABS Take 1 tablet by mouth daily. 30 tablet 0  . potassium chloride SA (K-DUR,KLOR-CON) 20 MEQ tablet Take 20 mEq by mouth daily.     Marland Kitchen warfarin (COUMADIN) 5 MG tablet Take 7.5-10 mg by mouth See admin instructions. Mon and Fri 7.5 mg, 10 mg all other days     No current facility-administered medications for this visit.       Review of Systems:     Cardiac Review of Systems: Y or N  Chest Pain [  n  ]  Resting SOB [ y  ] Exertional SOB  Blue.Reese  ]  Vertell Limber Florencio.Farrier  ]   Pedal Edema [ n  ]    Palpitations Blue.Reese  ] Syncope  Florencio.Farrier  ]   Presyncope [ n ]  General Review of Systems: [Y] = yes [  ]=no Constitional: recent weight change [  ];  Wt loss over the last 3 months [   ] anorexia [  ]; fatigue [  ]; nausea [  ]; night sweats [  ]; fever [  ]; or chills [  ];          Dental: poor dentition[  ]; Last Dentist visit:   Eye : blurred vision [  ]; diplopia [   ]; vision changes [  ];  Amaurosis fugax[  ]; Resp: cough [  ];  wheezing[  ];  hemoptysis[  ]; shortness of breath[  ]; paroxysmal nocturnal dyspnea[  ]; dyspnea on exertion[  ]; or  orthopnea[  ];  GI:  gallstones[  ], vomiting[  ];  dysphagia[  ]; melena[  ];  hematochezia [  ]; heartburn[  ];   Hx of  Colonoscopy[  ]; GU: kidney stones [  ]; hematuria[  ];   dysuria [  ];  nocturia[  ];  history of     obstruction [  ]; urinary frequency [  ]             Skin: rash, swelling[  ];, hair loss[  ];  peripheral edema[  ];  or itching[  ]; Musculosketetal: myalgias[  ];  joint swelling[  ];  joint erythema[  ];  joint pain[  ];  back pain[  ];  Heme/Lymph: bruising[  ];  bleeding[  ];  anemia[  ];  Neuro: TIA[  ];  headaches[  ];  stroke[  ];  vertigo[  ];  seizures[  ];   paresthesias[  ];  difficulty walking[  ];  Psych:depression[  ]; anxiety[  ];  Endocrine: diabetes[  ];  thyroid dysfunction[  ];  Immunizations: Flu up to date [  ]; Pneumococcal up to date [  ];  Other:  Physical Exam: BP 137/74 (BP Location: Right Arm, Patient Position: Sitting, Cuff Size: Small)   Pulse 61   Resp 20   Ht '5\' 10"'$  (1.778 m)   Wt 175 lb (79.4 kg)   SpO2 95% Comment: RA  BMI 25.11 kg/m   PHYSICAL EXAMINATION: General appearance: alert, cooperative and no distress Head: Normocephalic, without obvious abnormality, atraumatic Neck: no adenopathy, no carotid bruit, no JVD, supple, symmetrical, trachea midline and thyroid not enlarged, symmetric, no tenderness/mass/nodules Lymph nodes: Cervical, supraclavicular, and axillary nodes normal. Resp: diminished breath sounds bibasilar Back: symmetric, no curvature. ROM normal. No CVA tenderness. Cardio: irregularly irregular rhythm GI: soft, non-tender; bowel sounds normal; no masses,  no organomegaly Extremities: no palpable pulses pt or dp bilaterial, recent dopplers done vascular office  Neurologic: Grossly normal  Diagnostic Studies & Laboratory data:     Recent Radiology Findings:   Nm Pet Image Restag (ps) Skull Base To Thigh  11/22/2015  CLINICAL DATA:  Subsequent treatment strategy for restaging of left-sided lung cancer.  EXAM: NUCLEAR MEDICINE PET SKULL BASE TO THIGH TECHNIQUE: 9.9 mCi F-18 FDG was injected intravenously. Full-ring PET imaging was performed from the skull base to thigh after the radiotracer. CT data was obtained and used for attenuation correction and anatomic localization. FASTING BLOOD GLUCOSE:  Value: 151 mg/dl COMPARISON:  Chest CTs, most recent 11/01/2015. FINDINGS: NECK No areas of abnormal hypermetabolism. CHEST Relatively diffuse hypermetabolism about anterior left-sided pleural and chest wall thickening. This measures a S.U.V. max of 6.1, including on image 67/series 4. This combination of pleural and chest wall soft tissue thickening measures on the order of 2.6 cm on image 66/series 4 today and is similar to 11/01/2015. Minimally increased from 2.4 cm on 05/10/2015. Focal hypermetabolism corresponds to the left upper lobe nodule, positioned about the inferior and lateral aspect of presumed radiation change. This measures 2.2 x 2.2 cm and a S.U.V. max of 6.0 on image 28/series 2. Right paratracheal node measures 9 mm and a S.U.V. max of 3.7, similar to the mediastinal pool. This node is similar in size back to 05/10/2015. Subcarinal node is normal sized at 7 mm and a S.U.V. max of 3.9 today. ABDOMEN/PELVIS No areas of abnormal hypermetabolism. SKELETON No abnormal marrow activity. CT IMAGES PERFORMED FOR ATTENUATION CORRECTION Cerebral atrophy. No cervical adenopathy. Chest findings deferred to recent diagnostic CT. Mild cardiomegaly with multivessel coronary artery atherosclerosis. Pulmonary artery enlargement, including a 3.0 cm right pulmonary artery. Centrilobular emphysema. Bibasilar subsegmental atelectasis. Mild renal cortical thinning bilaterally. Probable lower pole left renal cyst. Renal vascular calcifications bilaterally. Cholelithiasis, with stones on the order of 1.3 cm. Fat containing left larger than right inguinal hernias. Left second and third rib fractures again identified and favored to  related to radiation necrosis. A mild superior endplate L3 compression deformity is of indeterminate acuity. New since 11/26/2012 plain films. IMPRESSION: 1. The left upper lobe nodule, at the inferior aspect of radiation fibrosis, is hypermetabolic, suspicious for locally recurrent disease. Consider tissue sampling. 2. Thoracic nodes are small and demonstrate low-level hypermetabolism, similar to mediastinal pool. Indeterminate. 3. Hypermetabolism about an area of left-sided pleural and chest wall soft tissue thickening is favored to be related to radiation change, given its relatively diffuse appearance. This has been slightly progressive over prior exams and warrants special followup attention. 4. Pulmonary artery enlargement suggests pulmonary arterial hypertension. 5.  Atherosclerosis, including within the coronary arteries. 6. Mild L3 compression deformity. 7. Cholelithiasis. Electronically Signed   By: [name]  M.D.   On: 11/22/2015 14:50     I have independently reviewed the above radiologic studies.  Recent Lab Findings: Lab Results  Component Value Date   WBC 5.9 12/31/2015   HGB 13.3 12/31/2015   HCT 39.3 12/31/2015   PLT 90 (L) 12/31/2015   GLUCOSE 205 (H) 09/17/2012   ALT 28 09/11/2012   AST 43 (H) 09/11/2012   NA 135 09/17/2012   K 4.6 09/17/2012   CL 104 09/17/2012   CREATININE 1.5 (H) 06/13/2014   BUN 30.5 (H) 06/13/2014   CO2 26 09/17/2012   TSH 0.845 09/03/2012   INR 1.13 12/31/2015   HGBA1C 6.7 (H) 09/02/2012      Assessment / Plan:   Question of recurrent non small cell cancer in left upper lobe  And or left upper chest wall - now biopsy proven adenocarcinoma recurrence left upper lobe, the hypermetabolic area of the chest wall biopsy shows fibrous tissue.  I discussed the pathologic findings with the patient and his family. He would be a poor operative candidate first left upper lobectomy. But there is mild metabolic activity in the mediastinal nodes,   Right  paratracheal node measures 9 mm and a S.U.V. max of 3.7, similar to the mediastinal pool. This node is similar in size back to 05/10/2015. Subcarinal node is normal sized at 7 mm and a S.U.V. max of 3.9 - I discussed with him proceeding with bronchoscopy ebus 2 sample these nodes as it would significantly change therapy choices if they were positive. The patient took a dose of Coumadin last night, he will hold his Coumadin tonight and will proceed with bronchoscopy and ebus July 31.   Grace Isaac MD      [address] [place] [address] Office [phone]   Beeper [phone]  01/03/2016 1:12 PM

## 2016-01-04 ENCOUNTER — Encounter (HOSPITAL_COMMUNITY): Payer: Self-pay

## 2016-01-04 ENCOUNTER — Other Ambulatory Visit: Payer: Self-pay

## 2016-01-04 ENCOUNTER — Ambulatory Visit (HOSPITAL_COMMUNITY)
Admission: RE | Admit: 2016-01-04 | Discharge: 2016-01-04 | Disposition: A | Discharge status: Home / Self Care | Payer: Medicare Other | Source: Ambulatory Visit | Attending: Cardiothoracic Surgery | Admitting: Cardiothoracic Surgery

## 2016-01-04 ENCOUNTER — Encounter (HOSPITAL_COMMUNITY)
Admission: RE | Admit: 2016-01-04 | Discharge: 2016-01-04 | Disposition: A | Discharge status: Home / Self Care | Payer: Medicare Other | Source: Ambulatory Visit | Attending: Cardiothoracic Surgery | Admitting: Cardiothoracic Surgery

## 2016-01-04 DIAGNOSIS — R918 Other nonspecific abnormal finding of lung field: Secondary | ICD-10-CM | POA: Insufficient documentation

## 2016-01-04 DIAGNOSIS — I129 Hypertensive chronic kidney disease with stage 1 through stage 4 chronic kidney disease, or unspecified chronic kidney disease: Secondary | ICD-10-CM | POA: Diagnosis not present

## 2016-01-04 DIAGNOSIS — Z85118 Personal history of other malignant neoplasm of bronchus and lung: Secondary | ICD-10-CM | POA: Diagnosis not present

## 2016-01-04 DIAGNOSIS — I2510 Atherosclerotic heart disease of native coronary artery without angina pectoris: Secondary | ICD-10-CM | POA: Insufficient documentation

## 2016-01-04 DIAGNOSIS — Z955 Presence of coronary angioplasty implant and graft: Secondary | ICD-10-CM | POA: Insufficient documentation

## 2016-01-04 DIAGNOSIS — Z01812 Encounter for preprocedural laboratory examination: Secondary | ICD-10-CM | POA: Insufficient documentation

## 2016-01-04 DIAGNOSIS — C3412 Malignant neoplasm of upper lobe, left bronchus or lung: Secondary | ICD-10-CM | POA: Diagnosis not present

## 2016-01-04 DIAGNOSIS — E1122 Type 2 diabetes mellitus with diabetic chronic kidney disease: Secondary | ICD-10-CM | POA: Insufficient documentation

## 2016-01-04 DIAGNOSIS — E7800 Pure hypercholesterolemia, unspecified: Secondary | ICD-10-CM | POA: Diagnosis not present

## 2016-01-04 DIAGNOSIS — R911 Solitary pulmonary nodule: Secondary | ICD-10-CM

## 2016-01-04 DIAGNOSIS — J849 Interstitial pulmonary disease, unspecified: Secondary | ICD-10-CM | POA: Diagnosis not present

## 2016-01-04 DIAGNOSIS — Q211 Atrial septal defect: Secondary | ICD-10-CM | POA: Insufficient documentation

## 2016-01-04 DIAGNOSIS — Z01818 Encounter for other preprocedural examination: Secondary | ICD-10-CM | POA: Diagnosis not present

## 2016-01-04 DIAGNOSIS — Z7901 Long term (current) use of anticoagulants: Secondary | ICD-10-CM | POA: Insufficient documentation

## 2016-01-04 DIAGNOSIS — Z87891 Personal history of nicotine dependence: Secondary | ICD-10-CM | POA: Diagnosis not present

## 2016-01-04 DIAGNOSIS — Z794 Long term (current) use of insulin: Secondary | ICD-10-CM | POA: Diagnosis not present

## 2016-01-04 DIAGNOSIS — Z79899 Other long term (current) drug therapy: Secondary | ICD-10-CM | POA: Insufficient documentation

## 2016-01-04 DIAGNOSIS — Z923 Personal history of irradiation: Secondary | ICD-10-CM | POA: Insufficient documentation

## 2016-01-04 DIAGNOSIS — I482 Chronic atrial fibrillation: Secondary | ICD-10-CM | POA: Diagnosis not present

## 2016-01-04 DIAGNOSIS — Z7982 Long term (current) use of aspirin: Secondary | ICD-10-CM | POA: Insufficient documentation

## 2016-01-04 DIAGNOSIS — J449 Chronic obstructive pulmonary disease, unspecified: Secondary | ICD-10-CM | POA: Diagnosis not present

## 2016-01-04 DIAGNOSIS — N189 Chronic kidney disease, unspecified: Secondary | ICD-10-CM | POA: Insufficient documentation

## 2016-01-04 HISTORY — DX: Cardiac arrhythmia, unspecified: I49.9

## 2016-01-04 HISTORY — DX: Atrial septal defect: Q21.1

## 2016-01-04 HISTORY — DX: Patent foramen ovale: Q21.12

## 2016-01-04 LAB — GLUCOSE, CAPILLARY: Glucose-Capillary: 249 mg/dL — ABNORMAL HIGH (ref 65–99)

## 2016-01-04 LAB — COMPREHENSIVE METABOLIC PANEL
ALT: 16 U/L — ABNORMAL LOW (ref 17–63)
AST: 17 U/L (ref 15–41)
Albumin: 3.8 g/dL (ref 3.5–5.0)
Alkaline Phosphatase: 64 U/L (ref 38–126)
Anion gap: 6 (ref 5–15)
BUN: 29 mg/dL — ABNORMAL HIGH (ref 6–20)
CO2: 21 mmol/L — ABNORMAL LOW (ref 22–32)
Calcium: 9.2 mg/dL (ref 8.9–10.3)
Chloride: 112 mmol/L — ABNORMAL HIGH (ref 101–111)
Creatinine, Ser: 1.52 mg/dL — ABNORMAL HIGH (ref 0.61–1.24)
GFR calc Af Amer: 49 mL/min — ABNORMAL LOW (ref >60)
GFR calc non Af Amer: 42 mL/min — ABNORMAL LOW (ref >60)
Glucose, Bld: 214 mg/dL — ABNORMAL HIGH (ref 65–99)
Potassium: 4.4 mmol/L (ref 3.5–5.1)
Sodium: 139 mmol/L (ref 135–145)
Total Bilirubin: 0.7 mg/dL (ref 0.3–1.2)
Total Protein: 7 g/dL (ref 6.5–8.1)

## 2016-01-04 LAB — CBC
HCT: 40.9 % (ref 39.0–52.0)
Hemoglobin: 13.4 g/dL (ref 13.0–17.0)
MCH: 28.2 pg (ref 26.0–34.0)
MCHC: 32.8 g/dL (ref 30.0–36.0)
MCV: 85.9 fL (ref 78.0–100.0)
Platelets: 85 10*3/uL — ABNORMAL LOW (ref 150–400)
RBC: 4.76 MIL/uL (ref 4.22–5.81)
RDW: 15.6 % — ABNORMAL HIGH (ref 11.5–15.5)
WBC: 5.9 10*3/uL (ref 4.0–10.5)

## 2016-01-04 LAB — PROTIME-INR
INR: 1.02
Prothrombin Time: 13.5 seconds (ref 11.4–15.2)

## 2016-01-04 LAB — APTT: aPTT: 34 seconds (ref 24–36)

## 2016-01-04 NOTE — Progress Notes (Signed)
Anesthesiology Addendum:  As noted, patient has a new RBBB since the last tracing in 2014. I discussed these findings with Dr. Servando Snare and he stated the patient is clinically improved since 2014. Dr. Servando Snare also said that he plans to perform a bronch/EBUS at this time and will only do a mediastinoscopy only if the results are inconclusive. Should more aggressive surgery be considered, he will send the patient for further cardiac workup.   Brandon Clay

## 2016-01-04 NOTE — Progress Notes (Addendum)
Cardiologist-saw Dr.Nishan in 2014  Sees PA Cyndi Bender for primary needs  Echo and stress reports in epic from 2014  Heart cath 2014    Per Note in [place] pt to hold Coumadin on starting July 27.

## 2016-01-04 NOTE — Pre-Procedure Instructions (Signed)
Bartlett Enke Scarbro  01/04/2016      [id number] [address]Phone: [phone]: [address]Phone: [phone]: [phone]   Your procedure is scheduled on Mon, July 31 @ 7:30 AM  [facility]  Call this number if you have problems the morning of surgery:  [phone]  Remember:  Do not eat food or drink liquids after midnight.  Take these medicines the morning of surgery with A SIP OF WATER Digoxin(Lanoxin) and Metoprolol(Lopressor)  No Goody's,BC's,Aleve,Advil,Ibuprofen,Motrin,Fish Oil,or any Herbal Medications.      How to Manage Your Diabetes Before and After Surgery  Why is it important to control my blood sugar before and after surgery? . Improving blood sugar levels before and after surgery helps healing and can limit problems. . A way of improving blood sugar control is eating a healthy diet by: o  Eating less sugar and carbohydrates o  Increasing activity/exercise o  Talking with your doctor about reaching your blood sugar goals . High blood sugars (greater than 180 mg/dL) can raise your risk of infections and slow your recovery, so you will need to focus on controlling your diabetes during the weeks before surgery. . Make sure that the doctor who takes care of your diabetes knows about your planned surgery including the date and location.  How do I manage my blood sugar before surgery? . Check your blood sugar at least 4 times a day, starting 2 days before surgery, to make sure that the level is not too high or low. o Check your blood sugar the morning of your surgery when you wake up and every 2 hours until you get to the Short Stay unit. . If your blood sugar is less than 70 mg/dL, you will  need to treat for low blood sugar: o Do not take insulin. o Treat a low blood sugar (less than 70 mg/dL) with  cup of clear juice (cranberry or apple), 4 glucose tablets, OR glucose gel. o Recheck blood sugar in 15 minutes after treatment (to make sure it is greater than 70 mg/dL). If your blood sugar is not greater than 70 mg/dL on recheck, call [phone] further instructions. . Report your blood sugar to the short stay nurse when you get to Short Stay.  . If you are admitted to the hospital after surgery: o Your blood sugar will be checked by the staff and you will probably be given insulin after surgery (instead of oral diabetes medicines) to make sure you have good blood sugar levels. o The goal for blood sugar control after surgery is 80-180 mg/dL.              WHAT DO I DO ABOUT MY DIABETES MEDICATION?   .Marland KitchenDo not take oral diabetes medicines (pills) the morning of surgery.  . THE NIGHT BEFORE SURGERY, take ______8_____ units of _______Lantus____insulin.       . THE MORNING OF SURGERY, take _________8____ units of _____Lantus_____insulin.  . The day of surgery, do not take other diabetes injectables, including Byetta (exenatide), Bydureon (exenatide ER), Victoza (liraglutide), or Trulicity (dulaglutide).  . If your CBG is greater than 220 mg/dL, you may take  of your sliding scale (correction) dose of insulin.  Other Instructions:          Patient Signature:  Date:   Nurse Signature:  Date:   Reviewed and Endorsed by [facility] Patient Education Committee, August 2015   Do not wear jewelry.  Do not wear lotions, powders, or colognes.     Men may shave face and neck.  Do not bring valuables to the hospital.  [facility] is not responsible for any belongings or valuables.  Contacts, dentures or bridgework may not be worn into surgery.  Leave your suitcase in the car.  After surgery it may be brought to your room.  For patients admitted to the  hospital, discharge time will be determined by your treatment team.  Patients discharged the day of surgery will not be allowed to drive home.    [facility]  Before surgery, you can play an important role.  Because skin is not sterile, your skin needs to be as free of germs as possible.  You can reduce the number of germs on you skin by washing with CHG (chlorahexidine gluconate) soap before surgery.  CHG is an antiseptic cleaner which kills germs and bonds with the skin to continue killing germs even after washing.  Please DO NOT use if you have an allergy to CHG or antibacterial soaps.  If your skin becomes reddened/irritated stop using the CHG and inform your nurse when you arrive at Short Stay.  Do not shave (including legs and underarms) for at least 48 hours prior to the first CHG shower.  You may shave your face.  Please follow these instructions carefully:   1.  Shower with CHG Soap the night before surgery and the                                morning of Surgery.  2.  If you choose to wash your hair, wash your hair first as usual with your       normal shampoo.  3.  After you shampoo, rinse your hair and body thoroughly to remove the                      Shampoo.  4.  Use CHG as you would any other liquid soap.  You can apply chg directly       to the skin and wash gently with scrungie or a clean washcloth.  5.  Apply the CHG Soap to your body ONLY FROM THE NECK DOWN.        Do not use on open wounds or open sores.  Avoid contact with your eyes,       ears, mouth and genitals (private parts).  Wash genitals (private parts)       with your normal soap.  6.  Wash thoroughly, paying special attention to the area where your surgery        will be performed.  7.  Thoroughly rinse your body with warm water from the neck down.  8.  DO NOT shower/wash with your normal soap after using and rinsing off       the CHG Soap.  9.  Pat yourself dry with a  clean towel.            10.  Wear clean pajamas.            11.  Place clean sheets on your bed the night  of your first shower and do not        sleep with pets.  Day of Surgery  Do not apply any lotions/deoderants the morning of surgery.  Please wear clean clothes to the hospital/surgery center.      Please read over the following fact sheets that you were given. Pain Booklet and Coughing and Deep Breathing

## 2016-01-04 NOTE — Progress Notes (Signed)
Anesthesia chart review: Patient is a 81 year old male scheduled for video bronchoscopy with endobronchial ultrasound, transbronchial biopsy on 01/07/2016 by Dr. Servando Snare. He has known lung CA s/p stereotactic radiotherapy '14, CAD, severe emphysema and poor functional status that was seen by Dr. Servando Snare yesterday for question of recurrent non-small cell left lung cancer. He had a left chest wall biopsy and left lung nodule biopsy by IR with left lung nodule positive for malignancy (see 01/03/16 progress note by Dr. Anselm Pancoast). Dr. 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 There was a PFO with bidirectional shunting by TTT 07/29/12.  - PCP is Cyndi Bender, PA-C. - Cardiologist is Dr. Johnsie Cancel [place], last seen 09/21/12. Saw EP cardiologist Dr. Rayann Heman on 07/29/12 who recommended rate control of afib following failed cardioversion. - Pulmonologist is Dr. Chase Caller, last visit 11/03/12. Joint decision that he could follow-up PRN. - Vascular surgeon is Dr. Bridgett Larsson. - RAD-ONC is Dr. Sondra Come.  Meds include aspirin 81 mg, digoxin, Lasix, Lantus, lisinopril, metformin, Lopressor, KCl, warfarin (held starting 01/03/16).  PAT Vitals: BP 132/59, HR 59, RR 20, T 36.4C, O2 sat 97%. CBG 249. BMI 25.11.  01/04/16 EKG: SR with first degree AVB, PACs, right BBB, plus RVH. Inferior infarct (age undetermined), Anterolateral infarct (age undetermined). SR has replaced afib, right BBB is new, small Q waves in V4-6 are new, Q wave in II is new (but present in III, aVF) when compared to 09/17/12  tracing.  07/29/12 TEE: Study Conclusions - Left ventricle: The cavity size was normal. Wall thickness was normal. Systolic function was normal. The estimated ejection fraction was in the range of 55% to 60%. Wall motion was normal; there were no regional wall motion abnormalities. - Aortic valve: There was no stenosis. - Aorta: Normal caliber. Grade III-IV plaque in the descending thoracic aorta. - Mitral valve: Trivial regurgitation. - Left atrium: The atrium was mildly to moderately dilated. No evidence of thrombus in the atrial cavity or appendage. - Right ventricle: The cavity size was normal. Systolic function was normal. - Right atrium: No evidence of thrombus in the atrial cavity or appendage. - Atrial septum: There is a PFO with bidirectional shunting. Impressions: - May proceed to DCCV.  07/02/12 Cardiac cath: Left main: Distal 20% stenosis.  Left Anterior Descending Artery: Large caliber vessel that courses to the apex. The proximal vessel has diffuse plaque disease. The mid vessel has a 60% stenosis just before the diagonal branch. The first diagonal branch is small in caliber and has diffuse disease. The second diagonal is small in caliber and has 99% stenosis.  Circumflex Artery: Large caliber vessel with 99% proximal stenosis. The distal AV groove Circumflex has diffuse 40% stenosis.  Right Coronary Artery: Large dominant vessel with 80% diffuse stenosis proximal vessel. The mid vessel has a 100% stenosis. The distal vessel fills from left to right collaterals.  Left Ventricular Angiogram: LVEF=45%. Inferior wall hypokinesis.  Impression: 1. 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  2. Segmental LV systolic dysfunction Recommendations: Complex situation in patient with lung cancer and new diagnosis of triple vessel CAD. His RCA  is chronically occluded. The proximal Circumflex has a severe  stenosis which could be treated percutaneously however this will need to be discussed with Dr. Servando Snare and Dr. Johnsie Cancel in regards to planning the timing with lobectomy. Will discuss possibility of CABG versus stenting of the Circumflex with a bare metal stent which would require one month of dual anti-platelet therapy. (After discussion, he was felt too high risk for combined CABG/lobectomy and too high risk for lobectomy without revascularization of the CX. RCA fills from left to right collaterals. Plan for BMS to CX followed by one month of DAPT then lobectomy--although ultimately he had stereotatic radiotherapy rather than surgical resection due to deconditioning, etc.) 07/12/12 PCI: Successful PTCA/bare metal stent x 1 proximal Circumflex.  01/04/16 CXR: IMPRESSION: Chronic interstitial lung disease. Left upper lobe mass poorly visualized.  11/22/15 PET scan: IMPRESSION: 1. The left upper lobe nodule, at the inferior aspect of radiation fibrosis, is hypermetabolic, suspicious for locally recurrent disease. Consider tissue sampling. 2. Thoracic nodes are small and demonstrate low-level hypermetabolism, similar to mediastinal pool. Indeterminate. 3. Hypermetabolism about an area of left-sided pleural and chest wall soft tissue thickening is favored to be related to radiation change, given its relatively diffuse appearance. This has been slightly progressive over prior exams and warrants special followup attention. 4. Pulmonary artery enlargement suggests pulmonary arterial hypertension. 5.  Atherosclerosis, including within the coronary arteries. 6. Mild L3 compression deformity. 7. Cholelithiasis.  Preoperative labs noted. Cr 1.52, stable since 06/13/14. Glucose 214. H/H 13.4/40.9. PLT 85K (previously 90K 12/31/15, 182K 09/17/12). PT/PTT WNL. A1c pending. (PLT count called to TCTS RN Thurmond Butts.)  Discussed with anesthesiologist Dr. Linna Caprice. If patient is without any worrisome CV symptoms than consider  proceeding as planned. He will discuss further with Dr. Servando Snare to inquire about his exam findings from his recent evaluation of patient and urgency of procedure.   George Hugh [facility] Short Stay Center/Anesthesiology Phone [phone] 01/04/2016 12:39 PM

## 2016-01-05 LAB — HEMOGLOBIN A1C
HEMOGLOBIN A1C: 8.7 % — AB (ref 4.8–5.6)
Mean Plasma Glucose: 203 mg/dL

## 2016-01-07 ENCOUNTER — Encounter (HOSPITAL_COMMUNITY): Payer: Self-pay | Admitting: Anesthesiology

## 2016-01-07 ENCOUNTER — Ambulatory Visit (HOSPITAL_COMMUNITY): Payer: Medicare Other | Admitting: Vascular Surgery

## 2016-01-07 ENCOUNTER — Encounter (HOSPITAL_COMMUNITY)
Admission: RE | Disposition: A | Discharge status: Home / Self Care | Payer: Self-pay | Source: Ambulatory Visit | Attending: Cardiothoracic Surgery

## 2016-01-07 ENCOUNTER — Ambulatory Visit (HOSPITAL_COMMUNITY): Payer: Medicare Other | Admitting: Anesthesiology

## 2016-01-07 ENCOUNTER — Ambulatory Visit (HOSPITAL_COMMUNITY)
Admission: RE | Admit: 2016-01-07 | Discharge: 2016-01-07 | Disposition: A | Discharge status: Home / Self Care | Payer: Medicare Other | Source: Ambulatory Visit | Attending: Cardiothoracic Surgery | Admitting: Cardiothoracic Surgery

## 2016-01-07 ENCOUNTER — Telehealth: Payer: Self-pay | Admitting: *Deleted

## 2016-01-07 DIAGNOSIS — C3412 Malignant neoplasm of upper lobe, left bronchus or lung: Secondary | ICD-10-CM

## 2016-01-07 DIAGNOSIS — J449 Chronic obstructive pulmonary disease, unspecified: Secondary | ICD-10-CM | POA: Insufficient documentation

## 2016-01-07 DIAGNOSIS — N189 Chronic kidney disease, unspecified: Secondary | ICD-10-CM

## 2016-01-07 DIAGNOSIS — Z923 Personal history of irradiation: Secondary | ICD-10-CM | POA: Diagnosis not present

## 2016-01-07 DIAGNOSIS — I482 Chronic atrial fibrillation: Secondary | ICD-10-CM | POA: Insufficient documentation

## 2016-01-07 DIAGNOSIS — R911 Solitary pulmonary nodule: Secondary | ICD-10-CM

## 2016-01-07 DIAGNOSIS — Z955 Presence of coronary angioplasty implant and graft: Secondary | ICD-10-CM | POA: Diagnosis not present

## 2016-01-07 DIAGNOSIS — I129 Hypertensive chronic kidney disease with stage 1 through stage 4 chronic kidney disease, or unspecified chronic kidney disease: Secondary | ICD-10-CM | POA: Insufficient documentation

## 2016-01-07 DIAGNOSIS — E1122 Type 2 diabetes mellitus with diabetic chronic kidney disease: Secondary | ICD-10-CM | POA: Insufficient documentation

## 2016-01-07 DIAGNOSIS — Z87891 Personal history of nicotine dependence: Secondary | ICD-10-CM | POA: Insufficient documentation

## 2016-01-07 DIAGNOSIS — I2510 Atherosclerotic heart disease of native coronary artery without angina pectoris: Secondary | ICD-10-CM | POA: Diagnosis not present

## 2016-01-07 DIAGNOSIS — Z7901 Long term (current) use of anticoagulants: Secondary | ICD-10-CM | POA: Insufficient documentation

## 2016-01-07 HISTORY — PX: LUNG BIOPSY: SHX5088

## 2016-01-07 HISTORY — DX: Chronic kidney disease, unspecified: N18.9

## 2016-01-07 HISTORY — PX: VIDEO BRONCHOSCOPY WITH ENDOBRONCHIAL ULTRASOUND: SHX6177

## 2016-01-07 LAB — GLUCOSE, CAPILLARY
Glucose-Capillary: 239 mg/dL — ABNORMAL HIGH (ref 65–99)
Glucose-Capillary: 180 mg/dL — ABNORMAL HIGH (ref 65–99)

## 2016-01-07 SURGERY — BRONCHOSCOPY, WITH EBUS
Anesthesia: General

## 2016-01-07 MED ORDER — OXYCODONE HCL 5 MG PO TABS: 5.0000 mg | ORAL_TABLET | Freq: Once | ORAL | Status: DC | PRN

## 2016-01-07 MED ORDER — PROPOFOL 10 MG/ML IV BOLUS
INTRAVENOUS | Status: AC
  Filled 2016-01-07: qty 20

## 2016-01-07 MED ORDER — FENTANYL CITRATE (PF) 100 MCG/2ML IJ SOLN
INTRAMUSCULAR | Status: DC | PRN
  Administered 2016-01-07: 50 ug via INTRAVENOUS

## 2016-01-07 MED ORDER — PHENYLEPHRINE HCL 10 MG/ML IJ SOLN
INTRAMUSCULAR | Status: DC | PRN
  Administered 2016-01-07 (×3): 40 ug via INTRAVENOUS
  Administered 2016-01-07: 80 ug via INTRAVENOUS
  Administered 2016-01-07: 40 ug via INTRAVENOUS
  Administered 2016-01-07: 80 ug via INTRAVENOUS

## 2016-01-07 MED ORDER — ONDANSETRON HCL 4 MG/2ML IJ SOLN
INTRAMUSCULAR | Status: DC | PRN
  Administered 2016-01-07: 4 mg via INTRAVENOUS

## 2016-01-07 MED ORDER — FENTANYL CITRATE (PF) 100 MCG/2ML IJ SOLN: 25.0000 ug | INTRAMUSCULAR | Status: DC | PRN

## 2016-01-07 MED ORDER — LACTATED RINGERS IV SOLN
INTRAVENOUS | Status: DC | PRN
  Administered 2016-01-07 (×2): via INTRAVENOUS

## 2016-01-07 MED ORDER — FENTANYL CITRATE (PF) 250 MCG/5ML IJ SOLN
INTRAMUSCULAR | Status: AC
  Filled 2016-01-07: qty 5

## 2016-01-07 MED ORDER — OXYCODONE HCL 5 MG/5ML PO SOLN: 5.0000 mg | Freq: Once | ORAL | Status: DC | PRN

## 2016-01-07 MED ORDER — LIDOCAINE HCL (CARDIAC) 20 MG/ML IV SOLN
INTRAVENOUS | Status: DC | PRN
  Administered 2016-01-07: 30 mg via INTRAVENOUS

## 2016-01-07 MED ORDER — EPHEDRINE SULFATE 50 MG/ML IJ SOLN
INTRAMUSCULAR | Status: DC | PRN
  Administered 2016-01-07: 10 mg via INTRAVENOUS
  Administered 2016-01-07: 5 mg via INTRAVENOUS
  Administered 2016-01-07 (×2): 10 mg via INTRAVENOUS
  Administered 2016-01-07: 5 mg via INTRAVENOUS

## 2016-01-07 MED ORDER — 0.9 % SODIUM CHLORIDE (POUR BTL) OPTIME
TOPICAL | Status: DC | PRN
  Administered 2016-01-07: 1000 mL

## 2016-01-07 MED ORDER — SUGAMMADEX SODIUM 200 MG/2ML IV SOLN
INTRAVENOUS | Status: DC | PRN
  Administered 2016-01-07: 158.8 mg via INTRAVENOUS

## 2016-01-07 MED ORDER — PROPOFOL 10 MG/ML IV BOLUS
INTRAVENOUS | Status: DC | PRN
  Administered 2016-01-07: 30 mg via INTRAVENOUS
  Administered 2016-01-07: 100 mg via INTRAVENOUS

## 2016-01-07 MED ORDER — ONDANSETRON HCL 4 MG/2ML IJ SOLN: 4.0000 mg | Freq: Four times a day (QID) | INTRAMUSCULAR | Status: DC | PRN

## 2016-01-07 MED ORDER — ROCURONIUM BROMIDE 100 MG/10ML IV SOLN
INTRAVENOUS | Status: DC | PRN
  Administered 2016-01-07: 40 mg via INTRAVENOUS

## 2016-01-07 SURGICAL SUPPLY — 23 items
BRUSH CYTOL CELLEBRITY 1.5X140 (MISCELLANEOUS) IMPLANT
CANISTER SUCTION 2500CC (MISCELLANEOUS) ×2 IMPLANT
CONT SPEC 4OZ CLIKSEAL STRL BL (MISCELLANEOUS) ×2 IMPLANT
COVER DOME SNAP 22 D (MISCELLANEOUS) ×2 IMPLANT
COVER TABLE BACK 60X90 (DRAPES) ×2 IMPLANT
FORCEPS BIOP RJ4 1.8 (CUTTING FORCEPS) IMPLANT
GAUZE SPONGE 4X4 12PLY STRL (GAUZE/BANDAGES/DRESSINGS) ×2 IMPLANT
GLOVE BIO SURGEON STRL SZ 6.5 (GLOVE) ×2 IMPLANT
KIT CLEAN ENDO COMPLIANCE (KITS) ×4 IMPLANT
KIT ROOM TURNOVER OR (KITS) ×2 IMPLANT
MARKER SKIN DUAL TIP RULER LAB (MISCELLANEOUS) ×2 IMPLANT
NEEDLE BIOPSY TRANSBRONCH 21G (NEEDLE) IMPLANT
NEEDLE BLUNT 18X1 FOR OR ONLY (NEEDLE) IMPLANT
NEEDLE EBUS SONO TIP PENTAX (NEEDLE) ×2 IMPLANT
NS IRRIG 1000ML POUR BTL (IV SOLUTION) ×2 IMPLANT
OIL SILICONE PENTAX (PARTS (SERVICE/REPAIRS)) ×2 IMPLANT
PAD ARMBOARD 7.5X6 YLW CONV (MISCELLANEOUS) ×4 IMPLANT
SYR 20CC LL (SYRINGE) ×2 IMPLANT
SYR 20ML ECCENTRIC (SYRINGE) ×2 IMPLANT
TOWEL OR 17X24 6PK STRL BLUE (TOWEL DISPOSABLE) ×2 IMPLANT
TRAP SPECIMEN MUCOUS 40CC (MISCELLANEOUS) ×2 IMPLANT
TUBE CONNECTING 20X1/4 (TUBING) ×2 IMPLANT
WATER STERILE IRR 1000ML POUR (IV SOLUTION) ×2 IMPLANT

## 2016-01-07 NOTE — Brief Op Note (Signed)
      [address]       [place] [address]             [phone]      01/07/2016  9:06 AM  PATIENT:  Brandon Clay July  81 y.o. male  PRE-OPERATIVE DIAGNOSIS:  Non small Cell CA of left Upper Lobe   POST-OPERATIVE DIAGNOSIS:  Same   PROCEDURE:  Procedure(s): VIDEO BRONCHOSCOPY WITH ENDOBRONCHIAL ULTRASOUND (N/A) TRANSBRONCHIAL BIOPSY (N/A) of 4R and 7 nodes   SURGEON:  Surgeon(s) and Role:    * Grace Isaac, MD - Primary   ANESTHESIA:   general  EBL:  Total I/O In: 1000 [I.V.:1000] Out: -   BLOOD ADMINISTERED:none  DRAINS: none   LOCAL MEDICATIONS USED:  NONE  SPECIMEN:  Source of Specimen:  4R and 7 nodes   DISPOSITION OF SPECIMEN:  PATHOLOGY  COUNTS:  YES  TOURNIQUET:  * No tourniquets in log *  DICTATION: .Dragon Dictation  PLAN OF CARE: Discharge to home after PACU  PATIENT DISPOSITION:  PACU - hemodynamically stable.   Delay start of Pharmacological VTE agent (>24hrs) due to surgical blood loss or risk of bleeding: yes

## 2016-01-07 NOTE — Anesthesia Procedure Notes (Signed)
Procedure Name: Intubation Date/Time: 01/07/2016 7:36 AM Performed by: Neldon Newport Pre-anesthesia Checklist: Timeout performed, Patient being monitored, Suction available, Emergency Drugs available and Patient identified Patient Re-evaluated:Patient Re-evaluated prior to inductionOxygen Delivery Method: Circle system utilized Preoxygenation: Pre-oxygenation with 100% oxygen Intubation Type: IV induction Ventilation: Mask ventilation without difficulty Laryngoscope Size: Mac and 4 Grade View: Grade I Tube type: Oral Tube size: 8.5 mm Number of attempts: 1 Placement Confirmation: breath sounds checked- equal and bilateral and positive ETCO2 Secured at: 22 cm Tube secured with: Tape Dental Injury: Teeth and Oropharynx as per pre-operative assessment

## 2016-01-07 NOTE — Transfer of Care (Signed)
Immediate Anesthesia Transfer of Care Note  Patient: Brandon Clay  Procedure(s) Performed: Procedure(s): VIDEO BRONCHOSCOPY WITH ENDOBRONCHIAL ULTRASOUND (N/A) TRANSBRONCHIAL BIOPSY (N/A)  Patient Location: PACU  Anesthesia Type:General  Level of Consciousness: sedated  Airway & Oxygen Therapy: Patient Spontanous Breathing and Patient connected to face mask oxygen  Post-op Assessment: Report given to RN, Post -op Vital signs reviewed and stable and Patient moving all extremities X 4  Post vital signs: Reviewed and stable  Last Vitals:  Vitals:   01/07/16 0622 01/07/16 0915  BP: (!) 152/79 105/60  Pulse: (!) 56 79  Resp: 20 17  Temp: 36.4 C     Last Pain:  Vitals:   01/07/16 0622  TempSrc: Oral         Complications: No apparent anesthesia complications

## 2016-01-07 NOTE — Anesthesia Postprocedure Evaluation (Signed)
Anesthesia Post Note  Patient: Brandon Clay  Procedure(s) Performed: Procedure(s) (LRB): VIDEO BRONCHOSCOPY WITH ENDOBRONCHIAL ULTRASOUND (N/A) TRANSBRONCHIAL BIOPSY (N/A)  Patient location during evaluation: PACU Anesthesia Type: General Level of consciousness: awake and alert and patient cooperative Pain management: pain level controlled Vital Signs Assessment: post-procedure vital signs reviewed and stable Respiratory status: spontaneous breathing and respiratory function stable Cardiovascular status: stable Anesthetic complications: no    Last Vitals:  Vitals:   01/07/16 1125 01/07/16 1132  BP:  111/89  Pulse:  60  Resp:  20  Temp: 36.7 C     Last Pain:  Vitals:   01/07/16 0622  TempSrc: Oral                 Meggan Dhaliwal S

## 2016-01-07 NOTE — H&P (Signed)
[address]       [place] [address]             [phone]                    [facility] Medical Record #[id number] Date of Birth: 1934/09/27  Referring: Dr Dwana Curd & Dr Julien Nordmann  Primary Care: Cyndi Bender, PA-C  Chief Complaint:    Lung mass   History of Present Illness:    EPIMENIO SCHETTER 81 y.o. male is seen in the office  today for abnormal PET scan. Patient known from 2014 when he presented with CAD, left lung mass, severe pulmonary disease and  Recurrent Cdiff. His functional status was poor. He was treated with coronary stent and stereotactic radiotherapy  To left upper lobe for stage I non small cell carcinoma. He has never gone back to cardiology. Since radiation therapy he has been followed with interval CT of chest. Enlarging area left upper lobe was noted and PET scan done.  Overall patients functional status is improved compared to 2014, he had fecal transplant and has not had further bouts of Cdiff colitis . He walks daily but still is limited by SOB. He has claudication rt> then left    He is on chronic coumadin for AFIB but has had no cardiology follow up since 2014.   CT-guided needle biopsy of the left lung lesion and the left chest wall was done several days ago, patient tolerated this well, path report is as below. At the multidisciplinary conference today results were reviewed, after discussing the case with radiology and addendum to the report is to be created as it is the core needle biopsy of the left upper lobe lung lesion which is adenocarcinoma and the needle biopsy of the pleura is dense fibrotic tissue. Radiology and pathology are issuing updated reports concerning this.  Diagnosis 1. Pleura, biopsy, Chest wall, left anterior - ADENOCARCINOMA. - SEE COMMENT. 2. Lung, needle/core biopsy(ies), LUL - DENSE FIBROSIS. - VIABLE MALIGNANCY IS NOT IDENTIFIED. Microscopic Comment 1. Dr. Vicente Males has reviewed the  case and concurs with this interpretation. Dr. Servando Snare was paged on 01/01/16. Additional studies can be performed upon clinician request. (JBK:ds 01/01/16) Enid Cutter MD Pathologist, Electronic Signature (Case signed 01/01/2016) Specimen Gross   Current Activity/ Functional Status:  Patient is independent with mobility/ambulation, transfers, ADL's, IADL's.   Zubrod Score: At the time of surgery this patient's most appropriate activity status/level should be described as: '[]'$     0    Normal activity, no symptoms '[]'$     1    Restricted in physical strenuous activity but ambulatory, able to do out light work '[x]'$     2    Ambulatory and capable of self care, unable to do work activities, up and about               >50 % of waking hours                              '[]'$     3    Only limited self care, in bed greater than 50% of waking hours '[]'$     4    Completely disabled, no self care, confined to bed or chair '[]'$     5    Moribund   Past Medical History:  Diagnosis Date  . Atrial enlargement, left   .  Benign prostatic hypertrophy   . CKD (chronic kidney disease)   . Colitis   . COPD (chronic obstructive pulmonary disease) ([place])   . Coronary artery disease    Cath 07/02/12 3v CAD w/ chronic occlusion of RCA (fills from left collaterals), severe stenosis prox LCx & moderately severe dz in mid LAD; s/p PTCA/BMS to prox LCx 07/12/12   . Diabetes mellitus, type 2 ([place])   . Dysrhythmia    A Fib,takes Coumadin daily  . Emphysema   . Hx of radiation therapy 08/23/12- 09/07/12   left upper chest region  . Hypercholesterolemia    statin intolerant  . Hypertension   . Lung cancer ([place])    Followed by Dr. Servando Snare (LUL squamous cell carcinoma)  . Normocytic anemia   . PFO (patent foramen ovale)    by 07/29/12 TEE    Past Surgical History:  Procedure Laterality Date  . CARDIAC CATHETERIZATION    . CARDIOVERSION N/A 07/29/2012   Procedure: CARDIOVERSION;  Surgeon: Larey Dresser, MD;  Location:  [facility];  Service: Cardiovascular;  Laterality: N/A;  . CORONARY ANGIOPLASTY WITH STENT PLACEMENT    . HEMORRHOID SURGERY    . LUNG BIOPSY    . PERCUTANEOUS CORONARY STENT INTERVENTION (PCI-S) N/A 07/12/2012   Procedure: PERCUTANEOUS CORONARY STENT INTERVENTION (PCI-S);  Surgeon: Burnell Blanks, MD;  Location: [facility] CATH LAB;  Service: Cardiovascular;  Laterality: N/A;  . TEE WITHOUT CARDIOVERSION N/A 07/29/2012   Procedure: TRANSESOPHAGEAL ECHOCARDIOGRAM (TEE);  Surgeon: Larey Dresser, MD;  Location: [facility] ENDOSCOPY;  Service: Cardiovascular;  Laterality: N/A;  patient coming from [place] long room 1236 talk to jennifer at [place]  talk to [facility] from care link will pick up pat. at 8:15 from [place]    Family History  Problem Relation Age of Onset  . Stroke Mother   . Heart disease Father     died of heart attack  . Lung cancer Brother   . Hypertension Brother   . Cancer Brother     brain cancer  . Hypothyroidism Brother   . Hyperlipidemia Sister   . Hyperlipidemia Sister     Social History   Social History  . Marital status: Married    Spouse name: N/A  . Number of children: 1  . Years of education: N/A   Occupational History  . retired [employer] for over 20 years  . retired     Clinical biochemist for 10 years   Social History Main Topics  . Smoking status: Former Smoker    Packs/day: 0.50    Years: 50.00    Types: Cigarettes    Start date: 06/09/1958    Quit date: 06/10/2007  . Smokeless tobacco: Never Used  . Alcohol use No  . Drug use: No  . Sexual activity: No   Other Topics Concern  . Not on file   Social History Narrative  . No narrative on file    History  Smoking Status  . Former Smoker  . Packs/day: 0.50  . Years: 50.00  . Types: Cigarettes  . Start date: 06/09/1958  . Quit date: 06/10/2007  Smokeless Tobacco  . Never Used    History  Alcohol Use No     Allergies  Allergen Reactions  . No Known Allergies      No current facility-administered medications for this encounter.    Facility-Administered Medications Ordered in Other Encounters  Medication Dose Route Frequency Provider Last Rate Last Dose  .  lactated ringers infusion    Continuous PRN Neldon Newport, CRNA          Review of Systems:     Cardiac Review of Systems: Y or N  Chest Pain [  n  ]  Resting SOB [ y  ] Exertional SOB  Blue.Reese  ]  Vertell Limber Florencio.Farrier  ]   Pedal Edema [ n  ]    Palpitations Blue.Reese  ] Syncope  Florencio.Farrier  ]   Presyncope [ n ]  General Review of Systems: [Y] = yes [  ]=no Constitional: recent weight change [  ];  Wt loss over the last 3 months [   ] anorexia [  ]; fatigue [  ]; nausea [  ]; night sweats [  ]; fever [  ]; or chills [  ];          Dental: poor dentition[  ]; Last Dentist visit:   Eye : blurred vision [  ]; diplopia [   ]; vision changes [  ];  Amaurosis fugax[  ]; Resp: cough [  ];  wheezing[  ];  hemoptysis[  ]; shortness of breath[  ]; paroxysmal nocturnal dyspnea[  ]; dyspnea on exertion[  ]; or orthopnea[  ];  GI:  gallstones[  ], vomiting[  ];  dysphagia[  ]; melena[  ];  hematochezia [  ]; heartburn[  ];   Hx of  Colonoscopy[  ]; GU: kidney stones [  ]; hematuria[  ];   dysuria [  ];  nocturia[  ];  history of     obstruction [  ]; urinary frequency [  ]             Skin: rash, swelling[  ];, hair loss[  ];  peripheral edema[  ];  or itching[  ]; Musculosketetal: myalgias[  ];  joint swelling[  ];  joint erythema[  ];  joint pain[  ];  back pain[  ];  Heme/Lymph: bruising[  ];  bleeding[  ];  anemia[  ];  Neuro: TIA[  ];  headaches[  ];  stroke[  ];  vertigo[  ];  seizures[  ];   paresthesias[  ];  difficulty walking[  ];  Psych:depression[  ]; anxiety[  ];  Endocrine: diabetes[  ];  thyroid dysfunction[  ];  Immunizations: Flu up to date [  ]; Pneumococcal up to date [  ];  Other:  Physical Exam: BP (!) 152/79   Pulse (!) 56   Temp 97.5 F (36.4 C) (Oral)   Resp 20   SpO2 97%   PHYSICAL  EXAMINATION: General appearance: alert, cooperative and no distress Head: Normocephalic, without obvious abnormality, atraumatic Neck: no adenopathy, no carotid bruit, no JVD, supple, symmetrical, trachea midline and thyroid not enlarged, symmetric, no tenderness/mass/nodules Lymph nodes: Cervical, supraclavicular, and axillary nodes normal. Resp: diminished breath sounds bibasilar Back: symmetric, no curvature. ROM normal. No CVA tenderness. Cardio: irregularly irregular rhythm GI: soft, non-tender; bowel sounds normal; no masses,  no organomegaly Extremities: no palpable pulses pt or dp bilaterial, recent dopplers done vascular office  Neurologic: Grossly normal  Diagnostic Studies & Laboratory data:     Recent Radiology Findings:   Dg Chest 2 View  Result Date: 01/04/2016 CLINICAL DATA:  History of lung cancer in 2014. Preoperative for lung biopsy. EXAM: CHEST  2 VIEW COMPARISON:  12/31/2015 FINDINGS: Cardiomediastinal silhouette is stably enlarged. Mediastinal contours appear intact. Atherosclerotic disease of the aorta. There is no evidence of pneumothorax.  Chronic coarsening of the interstitial markings. Left upper lobe pulmonary mass is better seen on recent chest CT. No appreciable pleural effusion. Osseous structures are without acute abnormality. Soft tissues are grossly normal. IMPRESSION: Chronic interstitial lung disease. Left upper lobe mass poorly visualized. Electronically Signed   By: Fidela Salisbury M.D.   On: 01/04/2016 09:14   Nm Pet Image Restag (ps) Skull Base To Thigh  11/22/2015  CLINICAL DATA:  Subsequent treatment strategy for restaging of left-sided lung cancer. EXAM: NUCLEAR MEDICINE PET SKULL BASE TO THIGH TECHNIQUE: 9.9 mCi F-18 FDG was injected intravenously. Full-ring PET imaging was performed from the skull base to thigh after the radiotracer. CT data was obtained and used for attenuation correction and anatomic localization. FASTING BLOOD GLUCOSE:  Value:  151 mg/dl COMPARISON:  Chest CTs, most recent 11/01/2015. FINDINGS: NECK No areas of abnormal hypermetabolism. CHEST Relatively diffuse hypermetabolism about anterior left-sided pleural and chest wall thickening. This measures a S.U.V. max of 6.1, including on image 67/series 4. This combination of pleural and chest wall soft tissue thickening measures on the order of 2.6 cm on image 66/series 4 today and is similar to 11/01/2015. Minimally increased from 2.4 cm on 05/10/2015. Focal hypermetabolism corresponds to the left upper lobe nodule, positioned about the inferior and lateral aspect of presumed radiation change. This measures 2.2 x 2.2 cm and a S.U.V. max of 6.0 on image 28/series 2. Right paratracheal node measures 9 mm and a S.U.V. max of 3.7, similar to the mediastinal pool. This node is similar in size back to 05/10/2015. Subcarinal node is normal sized at 7 mm and a S.U.V. max of 3.9 today. ABDOMEN/PELVIS No areas of abnormal hypermetabolism. SKELETON No abnormal marrow activity. CT IMAGES PERFORMED FOR ATTENUATION CORRECTION Cerebral atrophy. No cervical adenopathy. Chest findings deferred to recent diagnostic CT. Mild cardiomegaly with multivessel coronary artery atherosclerosis. Pulmonary artery enlargement, including a 3.0 cm right pulmonary artery. Centrilobular emphysema. Bibasilar subsegmental atelectasis. Mild renal cortical thinning bilaterally. Probable lower pole left renal cyst. Renal vascular calcifications bilaterally. Cholelithiasis, with stones on the order of 1.3 cm. Fat containing left larger than right inguinal hernias. Left second and third rib fractures again identified and favored to related to radiation necrosis. A mild superior endplate L3 compression deformity is of indeterminate acuity. New since 11/26/2012 plain films. IMPRESSION: 1. The left upper lobe nodule, at the inferior aspect of radiation fibrosis, is hypermetabolic, suspicious for locally recurrent disease. Consider  tissue sampling. 2. Thoracic nodes are small and demonstrate low-level hypermetabolism, similar to mediastinal pool. Indeterminate. 3. Hypermetabolism about an area of left-sided pleural and chest wall soft tissue thickening is favored to be related to radiation change, given its relatively diffuse appearance. This has been slightly progressive over prior exams and warrants special followup attention. 4. Pulmonary artery enlargement suggests pulmonary arterial hypertension. 5.  Atherosclerosis, including within the coronary arteries. 6. Mild L3 compression deformity. 7. Cholelithiasis. Electronically Signed   By: [name] M.D.   On: 11/22/2015 14:50     I have independently reviewed the above radiologic studies.  Recent Lab Findings: Lab Results  Component Value Date   WBC 5.9 01/04/2016   HGB 13.4 01/04/2016   HCT 40.9 01/04/2016   PLT 85 (L) 01/04/2016   GLUCOSE 214 (H) 01/04/2016   ALT 16 (L) 01/04/2016   AST 17 01/04/2016   NA 139 01/04/2016   K 4.4 01/04/2016   CL 112 (H) 01/04/2016   CREATININE 1.52 (H) 01/04/2016   BUN 29 (H)  01/04/2016   CO2 21 (L) 01/04/2016   TSH 0.845 09/03/2012   INR 1.02 01/04/2016   HGBA1C 8.7 (H) 01/04/2016   Lab Results  Component Value Date   INR 1.02 01/04/2016   INR 1.13 12/31/2015   INR 2.19 (H) 09/17/2012    Chronic Kidney Disease   Stage I     GFR >90  Stage II    GFR 60-89  Stage IIIA GFR 45-59  Stage IIIB GFR 30-44  Stage IV   GFR 15-29  Stage V    GFR  <15  Lab Results  Component Value Date   CREATININE 1.52 (H) 01/04/2016   Estimated Creatinine Clearance: 41.4 mL/min (by C-G formula based on SCr of 1.52 mg/dL).   Assessment / Plan:   1/Question of recurrent non small cell cancer in left upper lobe  And or left upper chest wall - now biopsy proven adenocarcinoma recurrence left upper lobe, the hypermetabolic area of the chest wall biopsy shows fibrous tissue.  I discussed the pathologic findings with the patient and his  family. He would be a poor operative candidate first left upper lobectomy. But there is mild metabolic activity in the mediastinal nodes,   Right paratracheal node measures 9 mm and a S.U.V. max of 3.7, similar to the mediastinal pool. This node is similar in size back to 05/10/2015. Subcarinal node is normal sized at 7 mm and a S.U.V. max of 3.9 - I discussed with him proceeding with bronchoscopy ebus to sample these nodes as it would significantly change therapy choices if they were positive.   The goals risks and alternatives of the planned surgical procedure bronchoscopy, EBUS biopsy  have been discussed with the patient in detail. The risks of the procedure including death, infection, stroke, myocardial infarction, bleeding, blood transfusion have all been discussed specifically.  I have quoted Malachy Chamber Robak a 3 % of perioperative mortality and a complication rate as high as 20 %. The patient's questions have been answered.Malachy Chamber Delacruz is willing  to proceed with the planned procedure.   Stage III chronic kidney disease  Chronic afib History of cdiff known CAD  Grace Isaac MD      [address] [place] [address] Office [phone]   Beeper [phone]  01/07/2016 7:23 AM

## 2016-01-07 NOTE — Anesthesia Preprocedure Evaluation (Addendum)
Anesthesia Evaluation  Patient identified by MRN, date of birth, ID band Patient awake    Reviewed: Allergy & Precautions, NPO status , Patient's Chart, lab work & pertinent test results  Airway Mallampati: II   Neck ROM: full    Dental  (+) Edentulous Upper, Edentulous Lower, Dental Advidsory Given   Pulmonary COPD, former smoker,  Lung CA   breath sounds clear to auscultation       Cardiovascular hypertension, + CAD, + Cardiac Stents, + Peripheral Vascular Disease and +CHF  + dysrhythmias Atrial Fibrillation  Rhythm:regular Rate:Normal     Neuro/Psych    GI/Hepatic   Endo/Other  diabetes, Type 2  Renal/GU Renal InsufficiencyRenal disease     Musculoskeletal   Abdominal   Peds  Hematology  (+) anemia ,   Anesthesia Other Findings   Reproductive/Obstetrics                            Anesthesia Physical Anesthesia Plan  ASA: III  Anesthesia Plan: General   Post-op Pain Management:    Induction: Intravenous  Airway Management Planned: Oral ETT  Additional Equipment:   Intra-op Plan:   Post-operative Plan: Extubation in OR  Informed Consent: I have reviewed the patients History and Physical, chart, labs and discussed the procedure including the risks, benefits and alternatives for the proposed anesthesia with the patient or authorized representative who has indicated his/her understanding and acceptance.   Dental Advisory Given  Plan Discussed with: CRNA, Anesthesiologist and Surgeon  Anesthesia Plan Comments:        Anesthesia Quick Evaluation

## 2016-01-07 NOTE — Discharge Instructions (Signed)
Flexible Bronchoscopy, Care After Refer to this sheet in the next few weeks. These instructions provide you with information on caring for yourself after your procedure. Your health care provider may also give you more specific instructions. Your treatment has been planned according to current medical practices, but problems sometimes occur. Call your health care provider if you have any problems or questions after your procedure.  WHAT TO EXPECT AFTER THE PROCEDURE It is normal to have the following symptoms for 24-48 hours after the procedure:   Increased cough.  Low-grade fever.  Sore throat or hoarse voice.  Small streaks of blood in your thick spit (sputum) if tissue samples were taken (biopsy). HOME CARE INSTRUCTIONS   Do not eat or drink anything for 2 hours after your procedure. Your nose and throat were numbed by medicine. If you try to eat or drink before the medicine wears off, food or drink could go into your lungs or you could burn yourself. After the numbness is gone and your cough and gag reflexes have returned, you may eat soft food and drink liquids slowly.   The day after the procedure, you can go back to your normal diet.   You may resume normal activities.   Keep all follow-up visits as directed by your health care provider. It is important to keep all your appointments, especially if tissue samples were taken for testing (biopsy). SEEK IMMEDIATE MEDICAL CARE IF:   You have increasing shortness of breath.   You become light-headed or faint.   You have chest pain.   You have any new concerning symptoms.  You cough up more than a small amount of blood.  The amount of blood you cough up increases. MAKE SURE YOU:  Understand these instructions.  Will watch your condition.  Will get help right away if you are not doing well or get worse.   This information is not intended to replace advice given to you by your health care provider. Make sure you discuss  any questions you have with your health care provider.   Document Released: 12/13/2004 Document Revised: 06/16/2014 Document Reviewed: 01/28/2013 Elsevier Interactive Patient Education [employer].

## 2016-01-07 NOTE — Telephone Encounter (Signed)
Oncology Nurse Navigator Documentation  Oncology Nurse Navigator Flowsheets 01/07/2016  Navigator Encounter Type Telephone  Telephone Outgoing Call  Confirmed Diagnosis Date 01/07/2016  Treatment Phase Pre-Tx/Tx Discussion  Barriers/Navigation Needs Coordination of Care  Interventions Coordination of Care  Coordination of Care Appts  Acuity Level 1  Acuity Level 1 Minimal follow up required  Time Spent with Patient 15   I received a message from Dr. Servando Snare to set patient up to see Rad and Med Onc.  I follow up with Dr. Julien Nordmann and he asked for testing to be sent.  I requested.  I Then received a call from the patient's daughter.  She stated Dr. Servando Snare gave her my number for an appt.  I updated her that I needed to gather more information before patient would be scheduled. She verbalized understanding of next steps.

## 2016-01-08 ENCOUNTER — Telehealth: Payer: Self-pay | Admitting: *Deleted

## 2016-01-08 ENCOUNTER — Encounter: Payer: Self-pay | Admitting: *Deleted

## 2016-01-08 ENCOUNTER — Encounter (HOSPITAL_COMMUNITY): Payer: Self-pay | Admitting: Cardiothoracic Surgery

## 2016-01-08 DIAGNOSIS — C3492 Malignant neoplasm of unspecified part of left bronchus or lung: Secondary | ICD-10-CM

## 2016-01-08 IMAGING — CT CT CHEST W/ CM
1 of 2 series · 14 of 31 positions shown, 18 images · IV contrast (OMNIPAQUE)
Comparison: 05/19/2013

CLINICAL DATA: Lung cancer diagnosed 6490. Completed radiation
therapy. Asymptomatic. Squamous cell lung cancer, left upper lobe.

EXAM:
CT CHEST WITH CONTRAST
TECHNIQUE: Multidetector CT imaging of the chest was performed during
intravenous contrast administration.
CONTRAST:  60mL OMNIPAQUE IOHEXOL 300 MG/ML  SOLN

[Series 2: rtn chest with st · axial · 0.76mm/px · z∈[-178,+96]mm · 14 of 67 slices shown, 18 images]
[im 6/67  mediastinal]
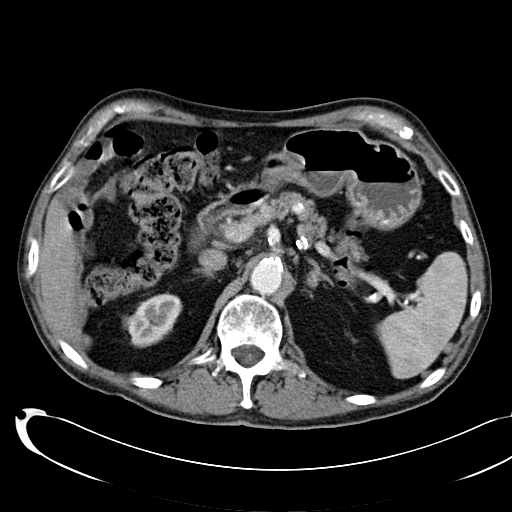
[im 6/67  lung]
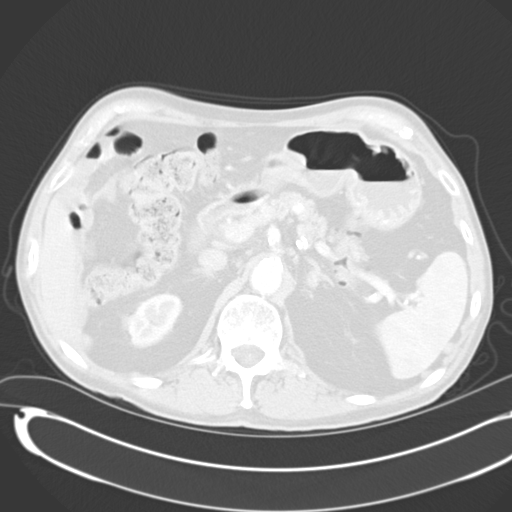
[im 11/67  lung]
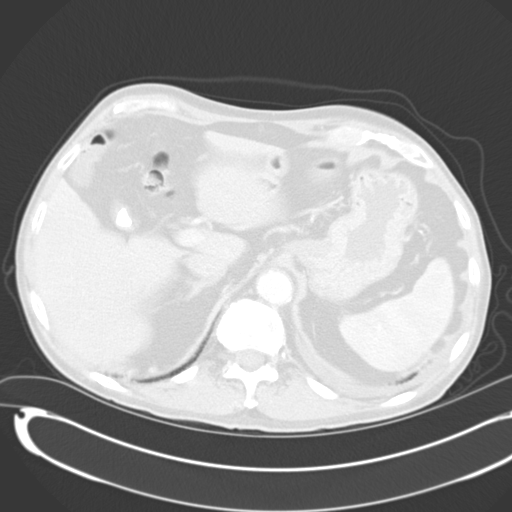
[im 16/67  lung]
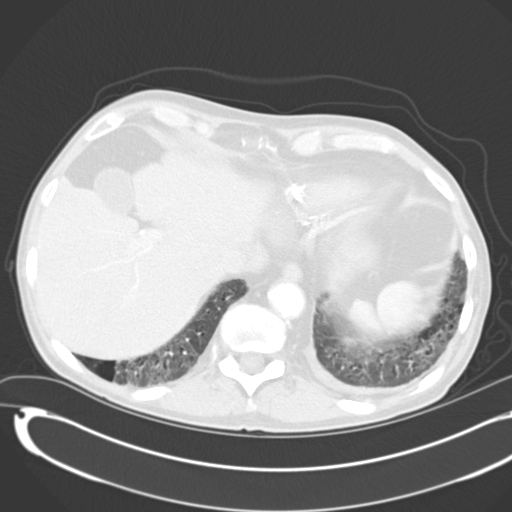
[im 21/67  lung]
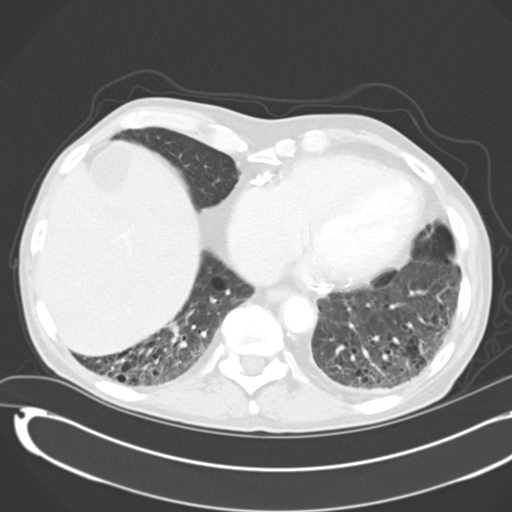
[im 26/67  mediastinal]
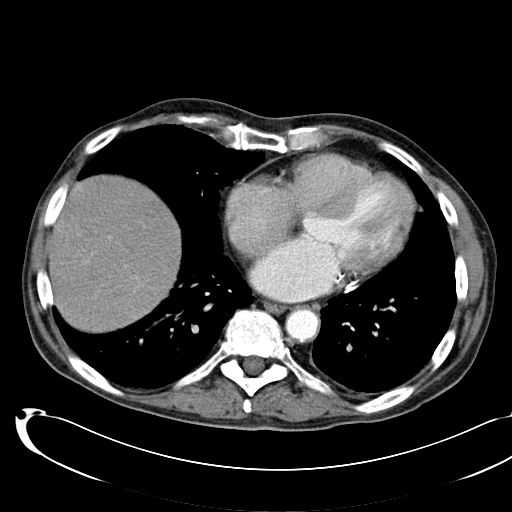
[im 26/67  lung]
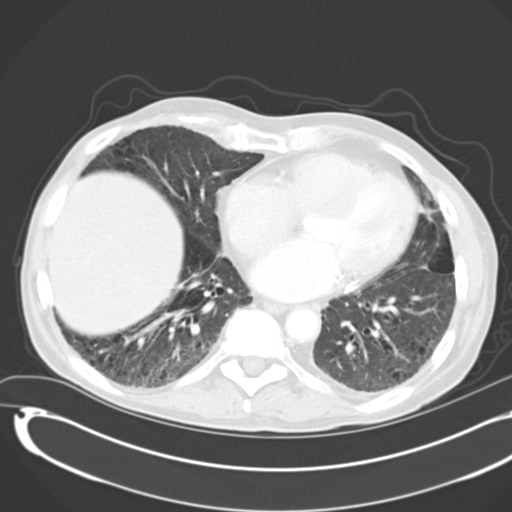
[im 31/67  lung]
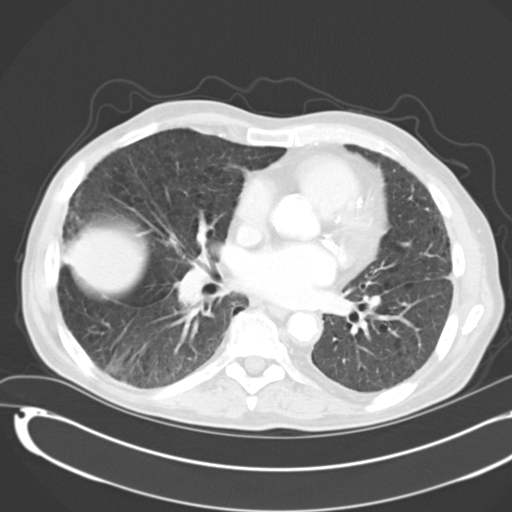
[im 32/67  lung]
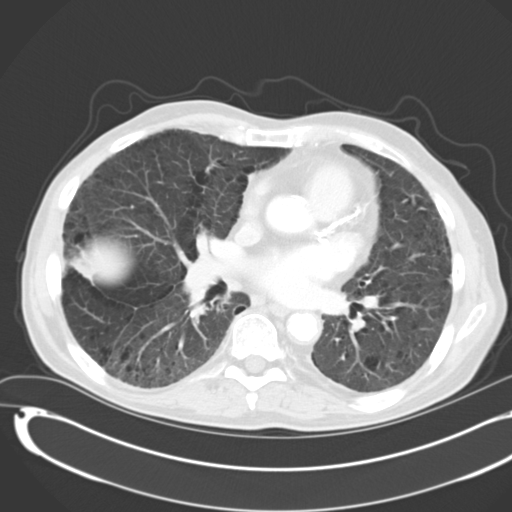
[im 34/67  lung]
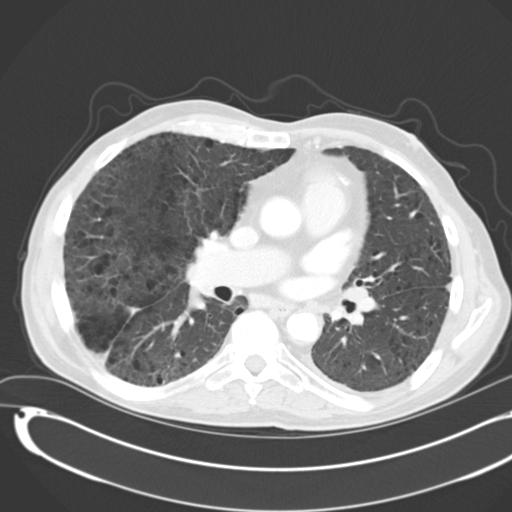
[im 36/67  mediastinal]
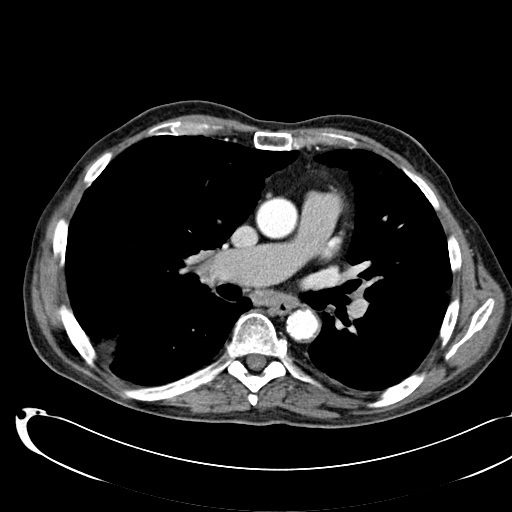
[im 36/67  lung]
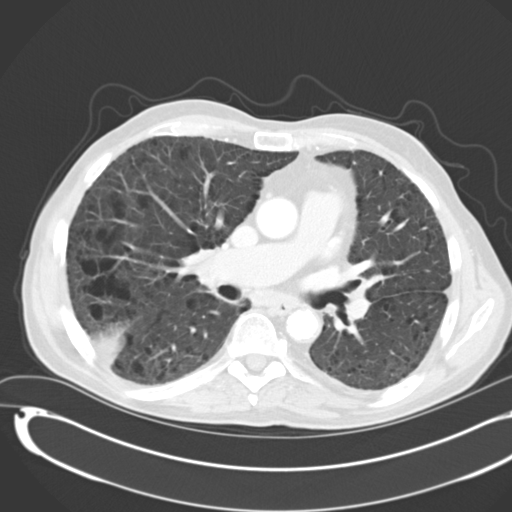
[im 41/67  lung]
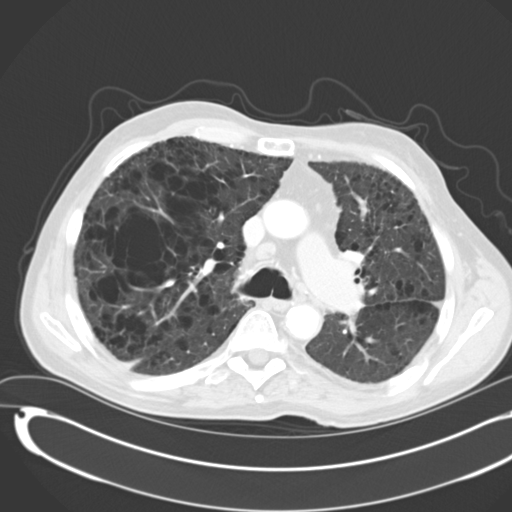
[im 46/67  lung]
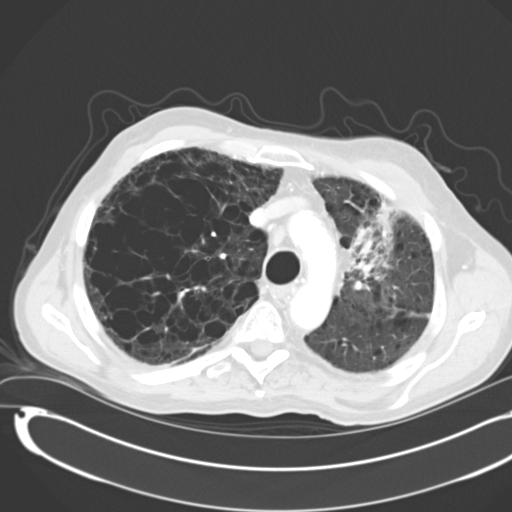
[im 51/67  lung]
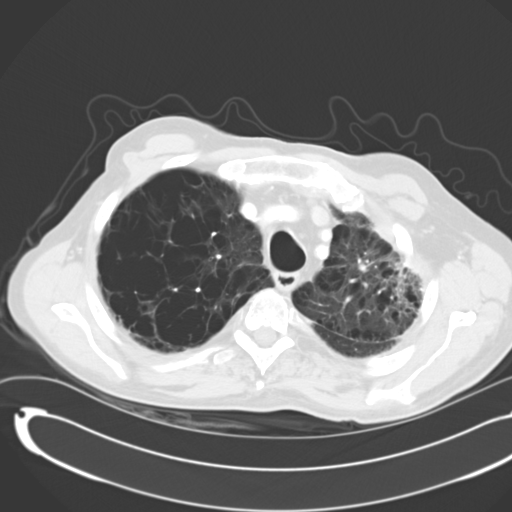
[im 56/67  mediastinal]
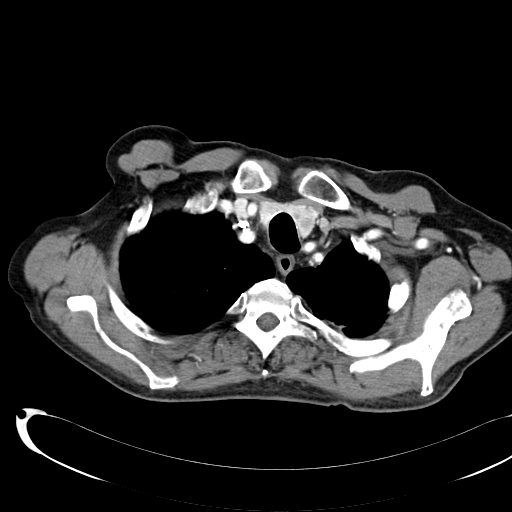
[im 56/67  lung]
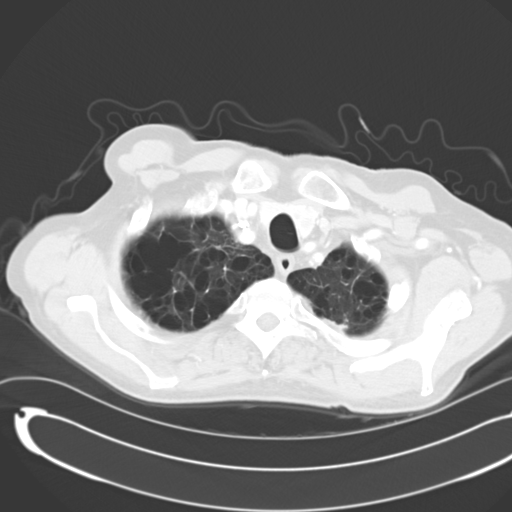
[im 61/67  lung]
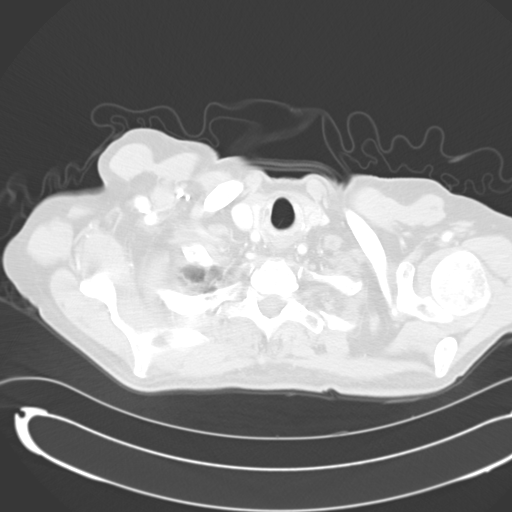

[14 of 31 positions shown; findings below may reference images not displayed]

FINDINGS: Diffuse severe emphysematous changes are reidentified. Wedge-shaped
region of left upper lobe pleural parenchymal consolidation is
reidentified, with the previously measured left upper lobe masslike
component now measuring 0.6 x 0.6 cm image 17 compared to 1.7 x
cm at the same anatomic level previously. More inferiorly, the
nodular component of consolidation associated with subpleural
masslike opacity is also smaller, now 1.8 x 1.5 cm image 19
(excluding adjacent pleural thickening), previously 2.2 x 1.8 cm at
the same anatomic level. Adjacent linear configuration parenchymal
consolidation likely indicates radiation change. Trace left pleural
fluid or thickening is reidentified. No new consolidation, nodule,
or mass is identified. Right middle lobe pleural parenchymal 0.7 cm
nodule image 40 is stable. No right pleural effusion.

Sub cm left thyroid lobe nodularity is stable. Moderate atheromatous
aortic calcification without aneurysm. Dominant pretracheal node now
measures 1.2 cm image 27. This is stable. Conglomerate subcarinal
nodal mass measures 1.2 cm in short axis diameter image 32, stable.

Incomplete imaging of the upper abdomen demonstrates areas of renal
cortical scarring. Nodular appearance of the left adrenal gland is
stable, although incompletely imaged. No measurable adrenal mass.
Gallstones noted within the otherwise normal-appearing gallbladder.
Hepatic granuloma reidentified image 55.

Heart size is normal. No pericardial effusion. Great vessels are
normal in caliber.

No acute osseous abnormality. Multiple thoracolumbar endplate
compression deformities are reidentified. No acute osseous
abnormality or new lytic or sclerotic osseous lesion.
IMPRESSION: Decrease in size of 2 masslike left upper lobe areas of pulmonary
parenchymal consolidation with underlying probable radiation
fibrosis, likely indicating response to treatment.

Severe emphysematous change reidentified.

## 2016-01-08 NOTE — Progress Notes (Signed)
Oncology Nurse Navigator Documentation  Oncology Nurse Navigator Flowsheets 01/08/2016  Navigator Encounter Type Other  Treatment Phase Pre-Tx/Tx Discussion  Barriers/Navigation Needs Coordination of Care  Interventions Coordination of Care  Coordination of Care Other  Acuity Level 1  Acuity Level 1 Minimal follow up required  Time Spent with Patient 15   I was notified by [facility] pathology that [facility] was sent on 01/03/16.  Dr. Julien Nordmann updated.

## 2016-01-08 NOTE — Telephone Encounter (Signed)
Oncology Nurse Navigator Documentation  Oncology Nurse Navigator Flowsheets 01/08/2016  Navigator Encounter Type Telephone  Telephone Outgoing Call  Treatment Phase Pre-Tx/Tx Discussion  Barriers/Navigation Needs Coordination of Care  Interventions Coordination of Care  Coordination of Care Appts  Acuity Level 1  Acuity Level 1 Minimal follow up required  Time Spent with Patient 15   I called patient's daughter and gave her an appt to see Dr. Julien Nordmann on 01/21/16 arrive at 1:00, labs at 1:15 and see Dr. Julien Nordmann at 1:30.

## 2016-01-08 NOTE — Telephone Encounter (Signed)
Patient's daughter is requesting an earlier appt.  I called gave them an earlier appt for 01/15/16 arrive at 1:00 labs at 1:15 see Dr. Julien Nordmann at 1:30.  She verbalized understanding of appt time and place.

## 2016-01-08 NOTE — Op Note (Signed)
NAME:  Brandon Clay, Brandon Clay NO.:  [id number]  MEDICAL RECORD NO.:  [id number]  LOCATION:  [facility]                         FACILITY:  [facility]  PHYSICIAN:  Lanelle Bal, MD    DATE OF BIRTH:  [birth year]  DATE OF PROCEDURE:  01/07/2016 DATE OF DISCHARGE:  01/07/2016                              OPERATIVE REPORT   PREOPERATIVE DIAGNOSIS:  Non-small cell carcinoma of the lung.  POSTOPERATIVE DIAGNOSIS:  Non-small cell carcinoma of the lung.  SURGICAL PROCEDURE:  Video bronchoscopy and endobronchial ultrasound with transbronchial biopsies of mediastinal nodes.  SURGEON:  Lanelle Bal, M.D.  BRIEF HISTORY:  The patient is a 81 year old male with known history of coronary artery disease.  Two years previously, he was treated for non- small cell lung cancer of the left upper lobe.  He now presented with increasing size of the left upper lobe lesion in the chest wall.  CT- guided needle biopsy showed fibrous changes in the chest wall lesion and recurrent non-small cell cancer in the left upper lobe lesion.  The patient had mildly hypermetabolic #7 lymph nodes.  Prior to proceeding with treatment, we recommended to the patient that we sampled these nodes.  The risks and options were discussed.  The patient agreed and signed informed consent.  DESCRIPTION OF THE PROCEDURE:  The patient underwent general endotracheal anesthesia without incident with a single-lumen 8.5-French endotracheal tube.  Appropriate time-out was performed.  We then proceeded with a video bronchoscopy and examined the right and left tracheobronchial tree to the subsegmental level without evidence of endobronchial lesion.  The scope was then removed and the EBUS scope placed into the right mainstem bronchus and easily identified #7 lymph nodes.  Multiple passes of this area into the lymph nodes where transbronchial aspiration needle was performed.  At least 3 separate passes were made initially for  quick examination smear, the remainder of the passes done and material was placed in sidelight and submitted to Pathology.  Initial smears showed adequate lymphoid tissue but without malignancy.  At this point, we removed the scope.  There was minimal bleeding.  The patient was then extubated in the operating room and transferred to recovery room for further observation having tolerated the procedure without obvious complication.     Lanelle Bal, MD     EG/MEDQ  D:  01/08/2016  T:  01/08/2016  Job:  [id number]

## 2016-01-09 ENCOUNTER — Encounter: Payer: Self-pay | Admitting: Radiation Oncology

## 2016-01-09 ENCOUNTER — Ambulatory Visit
Admission: RE | Admit: 2016-01-09 | Discharge: 2016-01-09 | Disposition: A | Discharge status: Home / Self Care | Payer: Medicare Other | Source: Ambulatory Visit | Attending: Radiation Oncology | Admitting: Radiation Oncology

## 2016-01-09 VITALS — BP 127/64 | HR 64 | Temp 97.5°F | Ht 70.0 in | Wt 178.3 lb

## 2016-01-09 DIAGNOSIS — Z923 Personal history of irradiation: Secondary | ICD-10-CM | POA: Diagnosis not present

## 2016-01-09 DIAGNOSIS — Z7982 Long term (current) use of aspirin: Secondary | ICD-10-CM | POA: Diagnosis not present

## 2016-01-09 DIAGNOSIS — Z79899 Other long term (current) drug therapy: Secondary | ICD-10-CM | POA: Diagnosis not present

## 2016-01-09 DIAGNOSIS — Z7901 Long term (current) use of anticoagulants: Secondary | ICD-10-CM | POA: Diagnosis not present

## 2016-01-09 DIAGNOSIS — C3492 Malignant neoplasm of unspecified part of left bronchus or lung: Secondary | ICD-10-CM | POA: Diagnosis present

## 2016-01-09 DIAGNOSIS — Z794 Long term (current) use of insulin: Secondary | ICD-10-CM | POA: Insufficient documentation

## 2016-01-09 NOTE — Progress Notes (Signed)
Please see the Nurse Progress Note in the MD Initial Consult Encounter for this patient. 

## 2016-01-09 NOTE — Progress Notes (Signed)
Radiation Oncology         [phone] ________________________________  Name: Brandon Clay MRN: [id number]  Date: 01/09/2016  DOB: 1934/08/21  Re-Evaluation Visit Note  CC: Cyndi Bender, PA-C  Grace Isaac, MD    ICD-9-CM ICD-10-CM   1. Squamous cell lung cancer, left (HCC) 162.9 C34.92   2. Adenocarcinoma of left lung, stage 1 (HCC) 162.9 C34.92     Diagnosis: Clinical stage I adenocarcinoma of the left upper lung, prior setting of squamous cell carcinoma of the left upper lung  Interval Since Last Radiation:  3 years 4 months; 08/23/2012 through 09/07/2012 to the Left upper chest region 60 Gy in 5 fractions (12 Gy per fraction).  Narrative:  The patient returns today for reevaluation. He had a PET scan 11/22/2015, revealing a hypermetabolic left upper lobe nodule suspicious for locally recurrent disease. He had a biopsy of this nodule and pathology showed it was adenocarcinoma. He had a video bronchoscopy with endobronchial ultrasound and transbronchial biopsy 01/07/2016 that was negative for cancer.  He mentions that he spit up some dark blood this morning from the procedure. He denies pain when he coughs. He mentions he has had a cough since surgery. He denies shortness of breath. He denies any pain. He is here today with his wife and daughter.                          ALLERGIES:  is allergic to no known allergies.  Meds: Current Outpatient Prescriptions  Medication Sig Dispense Refill  . aspirin EC 81 MG tablet Take 81 mg by mouth every other day.    . digoxin (LANOXIN) 0.125 MG tablet Take 1 tablet (0.125 mg total) by mouth daily. (Patient taking differently: Take 0.0625 mg by mouth daily. ) 30 tablet 0  . furosemide (LASIX) 20 MG tablet Take 20 mg by mouth daily.     . insulin glargine (LANTUS) 100 UNIT/ML injection Inject 16 Units into the skin 2 (two) times daily.    Marland Kitchen lisinopril (PRINIVIL,ZESTRIL) 10 MG tablet Take 10 mg by mouth daily.     . metoprolol tartrate  (LOPRESSOR) 25 MG tablet Take 1 tablet (25 mg total) by mouth 2 (two) times daily. (Patient taking differently: Take 12.5 mg by mouth 2 (two) times daily. ) 60 tablet 0  . Multiple Vitamin (MULTIVITAMIN WITH MINERALS) TABS Take 1 tablet by mouth daily. 30 tablet 0  . potassium chloride SA (K-DUR,KLOR-CON) 20 MEQ tablet Take 20 mEq by mouth daily.     Marland Kitchen warfarin (COUMADIN) 5 MG tablet Take 7.5-10 mg by mouth See admin instructions. Mon and Fri 7.5 mg, 10 mg all other days    . metFORMIN (GLUCOPHAGE-XR) 500 MG 24 hr tablet Take 500 mg by mouth daily with breakfast.      No current facility-administered medications for this encounter.    Physical Findings: The patient is in no acute distress. Patient is alert and oriented.  height is '5\' 10"'$  (1.778 m) and weight is 178 lb 4.8 oz (80.9 kg). His oral temperature is 97.5 F (36.4 C). His blood pressure is 127/64 and his pulse is 64. His oxygen saturation is 94%.   The lungs are clear to auscultation bilaterally. The heart has a regular rhythm and rate. No palpable supraclavicular, cervical, or axillary adenopathy.   Lab Findings: Lab Results  Component Value Date   WBC 5.9 01/04/2016   HGB 13.4 01/04/2016   HCT 40.9 01/04/2016  MCV 85.9 01/04/2016   PLT 85 (L) 01/04/2016    Radiographic Findings: Dg Chest 1 View  Result Date: 12/31/2015 CLINICAL DATA:  Status post biopsy EXAM: CHEST 1 VIEW COMPARISON:  PET-CT 11/22/2015. CT 11/01/2015. Chest x-ray 06/19/2015 and 12 02/2015. FINDINGS: Mediastinum hilar structures are unremarkable. Cardiomegaly with normal pulmonary vascularity. Low lung volumes with mild bibasilar atelectasis. Pleural parenchymal thickening again noted with bilateral pulmonary infiltrates as noted on prior exams. Reference is made to prior recent PET-CT report. No pleural effusion. No pneumothorax post biopsy . Gallstones noted. IMPRESSION: No pneumothorax post biopsy. Electronically Signed   By: Marcello Moores  Register   On: 12/31/2015  14:50  Dg Chest 2 View  Result Date: 01/04/2016 CLINICAL DATA:  History of lung cancer in 2014. Preoperative for lung biopsy. EXAM: CHEST  2 VIEW COMPARISON:  12/31/2015 FINDINGS: Cardiomediastinal silhouette is stably enlarged. Mediastinal contours appear intact. Atherosclerotic disease of the aorta. There is no evidence of pneumothorax. Chronic coarsening of the interstitial markings. Left upper lobe pulmonary mass is better seen on recent chest CT. No appreciable pleural effusion. Osseous structures are without acute abnormality. Soft tissues are grossly normal. IMPRESSION: Chronic interstitial lung disease. Left upper lobe mass poorly visualized. Electronically Signed   By: Fidela Salisbury M.D.   On: 01/04/2016 09:14  Ct Biopsy  Result Date: 12/31/2015 INDICATION: 81 year old with history of left lung cancer and status post radiation treatment. Recent PET CT raised concern for recurrence in the left upper lobe and along the left chest wall. Request for biopsy of both areas. EXAM: CT-GUIDED CORE BIOPSY OF LEFT UPPER LOBE LESION CT-GUIDED CORE BIOPSY OF LEFT CHEST WALL/PLEURAL THICKENING MEDICATIONS: None. ANESTHESIA/SEDATION: Moderate (conscious) sedation was employed during this procedure. A total of Versed 0.5 mg and Fentanyl 25 mcg was administered intravenously. Moderate Sedation Time: 20 minutes. The patient's level of consciousness and vital signs were monitored continuously by radiology nursing throughout the procedure under my direct supervision. FLUOROSCOPY TIME:  None COMPLICATIONS: None immediate. PROCEDURE: Informed written consent was obtained from the patient after a thorough discussion of the procedural risks, benefits and alternatives. All questions were addressed. Maximal Sterile Barrier Technique was utilized including caps, mask, sterile gowns, sterile gloves, sterile drape, hand hygiene and skin antiseptic. A timeout was performed prior to the initiation of the procedure. Images  through the chest were obtained. The left upper anterior chest was prepped and draped in sterile fashion. Skin was anesthetized with 1% lidocaine. Using CT guidance, 17 gauge coaxial needle was directed into the left upper lobe lesion. Needle placement was along the anterior aspect of the lesion. Two core biopsies obtained with an 18 gauge device. Specimens placed in formalin. The 17 gauge needle was removed without complication. The skin above the initial biopsy site was anesthetized with 1% lidocaine. A second incision was made. New 17 gauge needle was directed into the left anterior chest wall/ pleural thickening. The biopsy needle was directed towards the area which was hypermetabolic based on the previous PET-CT. The first 3 core biopsies yielded no tissue. The needle was redirected into the pleural thickening and a new core biopsy needle was obtained. Four additional core biopsies were obtained of the chest wall thickening. Specimens placed in formalin. 17 gauge needle was removed without complication. Bandage placed over the puncture site. FINDINGS: Extensive pleural-parenchymal thickening throughout the left upper chest. The lesion along the inferior aspect of the parenchymal disease was biopsied. Core biopsies were also obtained of the chest wall/pleural thickening in the anterior left  upper chest. No evidence for parenchymal hemorrhage or pneumothorax following the core biopsies. IMPRESSION: Successful CT-guided core biopsy of the left upper lobe lesion. Successful CT-guided core biopsy of the left anterior chest wall/pleural thickening. Electronically Signed   By: Markus Daft M.D.   On: 12/31/2015 18:04  Ct Biopsy  Result Date: 12/31/2015 INDICATION: 81 year old with history of left lung cancer and status post radiation treatment. Recent PET CT raised concern for recurrence in the left upper lobe and along the left chest wall. Request for biopsy of both areas. EXAM: CT-GUIDED CORE BIOPSY OF LEFT UPPER  LOBE LESION CT-GUIDED CORE BIOPSY OF LEFT CHEST WALL/PLEURAL THICKENING MEDICATIONS: None. ANESTHESIA/SEDATION: Moderate (conscious) sedation was employed during this procedure. A total of Versed 0.5 mg and Fentanyl 25 mcg was administered intravenously. Moderate Sedation Time: 20 minutes. The patient's level of consciousness and vital signs were monitored continuously by radiology nursing throughout the procedure under my direct supervision. FLUOROSCOPY TIME:  None COMPLICATIONS: None immediate. PROCEDURE: Informed written consent was obtained from the patient after a thorough discussion of the procedural risks, benefits and alternatives. All questions were addressed. Maximal Sterile Barrier Technique was utilized including caps, mask, sterile gowns, sterile gloves, sterile drape, hand hygiene and skin antiseptic. A timeout was performed prior to the initiation of the procedure. Images through the chest were obtained. The left upper anterior chest was prepped and draped in sterile fashion. Skin was anesthetized with 1% lidocaine. Using CT guidance, 17 gauge coaxial needle was directed into the left upper lobe lesion. Needle placement was along the anterior aspect of the lesion. Two core biopsies obtained with an 18 gauge device. Specimens placed in formalin. The 17 gauge needle was removed without complication. The skin above the initial biopsy site was anesthetized with 1% lidocaine. A second incision was made. New 17 gauge needle was directed into the left anterior chest wall/ pleural thickening. The biopsy needle was directed towards the area which was hypermetabolic based on the previous PET-CT. The first 3 core biopsies yielded no tissue. The needle was redirected into the pleural thickening and a new core biopsy needle was obtained. Four additional core biopsies were obtained of the chest wall thickening. Specimens placed in formalin. 17 gauge needle was removed without complication. Bandage placed over the  puncture site. FINDINGS: Extensive pleural-parenchymal thickening throughout the left upper chest. The lesion along the inferior aspect of the parenchymal disease was biopsied. Core biopsies were also obtained of the chest wall/pleural thickening in the anterior left upper chest. No evidence for parenchymal hemorrhage or pneumothorax following the core biopsies. IMPRESSION: Successful CT-guided core biopsy of the left upper lobe lesion. Successful CT-guided core biopsy of the left anterior chest wall/pleural thickening. Electronically Signed   By: Markus Daft M.D.   On: 12/31/2015 18:04   Impression: Mr. Krach is a 81 yo gentleman with clinical stage I adenocarcinoma of the left upper lung. He could be a potential candidate for SBRT depending on the location of this lesion as compared to his previous treatment. If there is significant overlap with his previous treatments then we will consider a more fractionated course of treatment.  Plan: We discussed the role of radiation and its side effects. This procedure has been fully reviewed with the patient and written informed consent has been obtained. His CT simulation appointment is scheduled 01/15/2016 at 10:30 am. He has an appointment with Dr. Julien Nordmann 01/15/2016.  -----------------------------------  Blair Promise, PhD, MD    This document serves as a record  of services personally performed by Gery Pray, MD. It was created on his behalf by Lendon Collar, a trained medical scribe. The creation of this record is based on the scribe's personal observations and the provider's statements to them. This document has been checked and approved by the attending provider.

## 2016-01-09 NOTE — Progress Notes (Signed)
Thoracic Location of Tumor / Histology: left upper lobe lung lesion - adenocarcinoma   Patient presented 11/22/15 for a PET scan witch showed a hypermetabolic left upper lobe lung nodule.  Biopsies revealed:   01/07/16 Diagnosis FINE NEEDLE ASPIRATION, ENDOSCOPIC SPECIMEN A, EBUS 4R NODE (SPECIMEN 1 OF 3 COLLECTED 01/07/2016) NO MALIGNANT CELLS IDENTIFIED.  Diagnosis FINE NEEDLE ASPIRATION, ENDOSCOPIC SPECIMEN B, 7 LYMPH NODE, (SPECIMEN 2 OF 3, COLLECTED ON 01/07/16) NO MALIGNANT CELLS IDENTIFIED.  Diagnosis FINE NEEDLE ASPIRATION, ENDOSCOPIC SPECIMEN C, EBUS 7 LYMPH NODE (SPECIMEN 3 OF 3 COLLECTED 01/07/2016) NO MALIGNANT CELLS IDENTIFIED.  12/31/15 Diagnosis 1. Lung, needle/core biopsy(ies), left upper lobe - ADENOCARCINOMA. - SEE COMMENT. 2. Pleura, biopsy, chest wall, left anterior - DENSE FIBROSIS. - VIABLE MALIGNANCY IS NOT IDENTIFIED.  Tobacco/Marijuana/Snuff/ETOH use: former smoker, quit in 2009, smoked 0.5 ppd for 50 years, denies ETOH, marijuana, snuff use.  Past/Anticipated interventions by cardiothoracic surgery, if any: 01/07/16 - Procedure: VIDEO BRONCHOSCOPY WITH ENDOBRONCHIAL ULTRASOUND and TRANSBRONCHIAL BIOPSY;  Surgeon: Grace Isaac, MD   Past/Anticipated interventions by medical oncology, if any: has an appointment with Dr. Julien Nordmann on 01/15/16.  Signs/Symptoms  Weight changes, if any: no  Respiratory complaints, if any: has had a cough since surgery, denies shortness of breath  Hemoptysis, if any: yes had some this morning, reports it was dark blood and a small amount  Pain issues, if any:  no  SAFETY ISSUES:  Prior radiation? Yes - 08/23/12-09/07/12, left upper chest region  Pacemaker/ICD? no   Possible current pregnancy?no  Is the patient on methotrexate? no  Current Complaints / other details:  Patient is here with his wife and daughter.  BP 127/64 (BP Location: Right Arm, Patient Position: Sitting)   Pulse 64   Temp 97.5 F (36.4 C) (Oral)    Ht '5\' 10"'$  (1.778 m)   Wt 178 lb 4.8 oz (80.9 kg)   SpO2 94%   BMI 25.58 kg/m    Wt Readings from Last 3 Encounters:  01/09/16 178 lb 4.8 oz (80.9 kg)  01/04/16 175 lb (79.4 kg)  01/03/16 175 lb (79.4 kg)

## 2016-01-15 ENCOUNTER — Ambulatory Visit
Admission: RE | Admit: 2016-01-15 | Discharge: 2016-01-15 | Disposition: A | Discharge status: Home / Self Care | Payer: Medicare Other | Source: Ambulatory Visit | Attending: Radiation Oncology | Admitting: Radiation Oncology

## 2016-01-15 ENCOUNTER — Telehealth: Payer: Self-pay | Admitting: Internal Medicine

## 2016-01-15 ENCOUNTER — Encounter: Payer: Self-pay | Admitting: Internal Medicine

## 2016-01-15 ENCOUNTER — Ambulatory Visit (HOSPITAL_BASED_OUTPATIENT_CLINIC_OR_DEPARTMENT_OTHER): Payer: Medicare Other | Admitting: Internal Medicine

## 2016-01-15 VITALS — BP 109/61 | HR 68 | Temp 97.8°F | Resp 17 | Ht 70.0 in | Wt 175.2 lb

## 2016-01-15 DIAGNOSIS — C3412 Malignant neoplasm of upper lobe, left bronchus or lung: Secondary | ICD-10-CM

## 2016-01-15 DIAGNOSIS — C3492 Malignant neoplasm of unspecified part of left bronchus or lung: Secondary | ICD-10-CM | POA: Diagnosis not present

## 2016-01-15 NOTE — Telephone Encounter (Signed)
Gave pt cal & avs °

## 2016-01-15 NOTE — Progress Notes (Signed)
[place] Telephone:(336) [phone]   [phone]  CONSULT NOTE  REFERRING PHYSICIAN: Dr. Lanelle Bal  REASON FOR CONSULTATION:  81 years old white male with recurrent lung cancer.  HPI Brandon Clay is a 81 y.o. male was past medical history significant for COPD, chronic kidney disease, COPD, coronary artery disease, diabetes mellitus, dyslipidemia, hypertension as well as long history of smoking but quit 10 years ago. The patient was diagnosed with a stage IA non-small cell lung cancer, squamous cell carcinoma in April 2017. The patient was not a surgical candidate and he underwent stereotactic radiotherapy to the left upper lobe lesion. Under the care of Dr. Sondra Come. He was followed by observation and routine imaging studies. CT scan of the chest without contrast on 11/01/2015 showed a 2.1 x 1.6 cm and regular left upper lobe pulmonary nodule at the anterior margin of the treated site increased from 1.4 x 0.9 cm on 05/10/2015. There was adjacent sharply marginated bandlike consolidation extending from the hilum to the periphery of the left upper lobe consistent with radiation fibrosis. There was medial left upper lobe subpleural 0.7 x 0.3 cm pulmonary nodule previously measured 0.6 x 0.3 cm. There was also basilar right lower lobe 1.1 x 0.7 cm solid pulmonary nodule unchanged compared to 05/10/2015. The patient has stable mild subcarinal lymphadenopathy and no new thoracic adenopathy. A PET scan on 11/22/2015 showed the left upper lobe nodule at the inferior aspect of radiation fibrosis is hypermetabolic suspicious for locally recurrent disease. Thoracic nodes are small and demonstrate low level hypermetabolism similar to the mediastinal pool. There was hypermetabolism about an area of left-sided pleural and chest wall soft tissue thickening favored to be related to radiation changes. On 12/31/2015 the patient underwent CT-guided core biopsy of the left upper lobe lung  lesion in addition to CT guided core biopsy of the left chest wall/pleural thickening by interventional radiology. The final pathology from the left upper lobe lung nodule (Accession: [id number]) was consistent with adenocarcinoma. The biopsy from the chest wall and pleural thickening showed dense fibrosis with no viable malignancy. The patient was also seen by Dr. Servando Snare and he underwent repeat bronchoscopy with endobronchial ultrasound and biopsy of the suspicious mediastinal lymph nodes but no evidence of malignancy was seen. He was referred to me today for evaluation and recommendation regarding treatment of his condition. The patient is not a good surgical candidate for resection. When seen today he is feeling fine with no specific complaints except for mild fatigue. He denied having any significant chest pain or shortness breath. He has dry cough with no hemoptysis. He denied having any significant weight loss or night sweats. He has no nausea, vomiting, diarrhea or constipation. He has no headache or visual changes. Family history significant for mother with a stroke, Brother had lung cancer, another brother had brain cancer and father had heart disease. The patient is married and has 1 daughter. He was accompanied today by his wife [name] and daughter [name]. 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 He has a history of smoking less than one pack per day for around 50 years. He quit 10 years ago. He has no history of alcohol or drug abuse.  HPI  Past Medical History:  Diagnosis Date  . Atrial enlargement, left   . Benign prostatic hypertrophy   . Chronic kidney disease 01/07/2016   Chronic Kidney Disease   Stage I     GFR >90  Stage II    GFR 60-89  Stage IIIA GFR 45-59  Stage IIIB GFR 30-44  Stage IV   GFR 15-29  Stage V    GFR  <15  Lab Results Component Value Date  CREATININE 1.52 (H) 01/04/2016  Estimated Creatinine Clearance: 41.4 mL/min (by C-G formula based on  SCr of 1.52 mg/dL).   . CKD (chronic kidney disease)   . Colitis   . COPD (chronic obstructive pulmonary disease) ([place])   . Coronary artery disease    Cath 07/02/12 3v CAD w/ chronic occlusion of RCA (fills from left collaterals), severe stenosis prox LCx & moderately severe dz in mid LAD; s/p PTCA/BMS to prox LCx 07/12/12   . Diabetes mellitus, type 2 ([place])   . Dysrhythmia    A Fib,takes Coumadin daily  . Emphysema   . Hx of radiation therapy 08/23/12- 09/07/12   left upper chest region  . Hypercholesterolemia    statin intolerant  . Hypertension   . Lung cancer ([place])    Followed by Dr. Servando Snare (LUL squamous cell carcinoma)  . Normocytic anemia   . PFO (patent foramen ovale)    by 07/29/12 TEE    Past Surgical History:  Procedure Laterality Date  . CARDIAC CATHETERIZATION    . CARDIOVERSION N/A 07/29/2012   Procedure: CARDIOVERSION;  Surgeon: Larey Dresser, MD;  Location: [place];  Service: Cardiovascular;  Laterality: N/A;  . CORONARY ANGIOPLASTY WITH STENT PLACEMENT    . HEMORRHOID SURGERY    . LUNG BIOPSY    . LUNG BIOPSY N/A 01/07/2016   Procedure: TRANSBRONCHIAL BIOPSY;  Surgeon: Grace Isaac, MD;  Location: [place];  Service: Thoracic;  Laterality: N/A;  . PERCUTANEOUS CORONARY STENT INTERVENTION (PCI-S) N/A 07/12/2012   Procedure: PERCUTANEOUS CORONARY STENT INTERVENTION (PCI-S);  Surgeon: Burnell Blanks, MD;  Location: [facility] CATH LAB;  Service: Cardiovascular;  Laterality: N/A;  . TEE WITHOUT CARDIOVERSION N/A 07/29/2012   Procedure: TRANSESOPHAGEAL ECHOCARDIOGRAM (TEE);  Surgeon: Larey Dresser, MD;  Location: [facility] ENDOSCOPY;  Service: Cardiovascular;  Laterality: N/A;  patient coming from [place] long room 1236 talk to jennifer at [place]  talk to [facility] from care link will pick up pat. at 8:15 from [place]  . VIDEO BRONCHOSCOPY WITH ENDOBRONCHIAL ULTRASOUND N/A 01/07/2016   Procedure: VIDEO BRONCHOSCOPY WITH ENDOBRONCHIAL ULTRASOUND;  Surgeon: Grace Isaac, MD;   Location: [facility] OR;  Service: Thoracic;  Laterality: N/A;    Family History  Problem Relation Age of Onset  . Stroke Mother   . Heart disease Father     died of heart attack  . Lung cancer Brother   . Hypertension Brother   . Cancer Brother     brain cancer  . Hypothyroidism Brother   . Hyperlipidemia Sister   . Hyperlipidemia Sister     Social History Social History  Substance Use Topics  . Smoking status: Former Smoker    Packs/day: 0.50    Years: 50.00    Types: Cigarettes    Start date: 06/09/1958    Quit date: 06/10/2007  . Smokeless tobacco: Never Used  . Alcohol use No    Allergies  Allergen Reactions  . No Known Allergies     Current Outpatient Prescriptions  Medication Sig Dispense Refill  . aspirin EC 81 MG tablet Take 81 mg by mouth every other day.    . digoxin (LANOXIN) 0.125 MG tablet Take 1 tablet (0.125 mg total) by mouth daily. (Patient taking differently: Take 0.0625 mg by mouth  daily. ) 30 tablet 0  . furosemide (LASIX) 20 MG tablet Take 20 mg by mouth daily.     . insulin glargine (LANTUS) 100 UNIT/ML injection Inject 16 Units into the skin 2 (two) times daily.    Marland Kitchen lisinopril (PRINIVIL,ZESTRIL) 10 MG tablet Take 10 mg by mouth daily.     . metFORMIN (GLUCOPHAGE-XR) 500 MG 24 hr tablet Take 500 mg by mouth daily with breakfast.     . metoprolol tartrate (LOPRESSOR) 25 MG tablet Take 1 tablet (25 mg total) by mouth 2 (two) times daily. (Patient taking differently: Take 12.5 mg by mouth 2 (two) times daily. ) 60 tablet 0  . Multiple Vitamin (MULTIVITAMIN WITH MINERALS) TABS Take 1 tablet by mouth daily. 30 tablet 0  . potassium chloride SA (K-DUR,KLOR-CON) 20 MEQ tablet Take 20 mEq by mouth daily.     Marland Kitchen warfarin (COUMADIN) 5 MG tablet Take 7.5-10 mg by mouth See admin instructions. Mon and Fri 7.5 mg, 10 mg all other days     No current facility-administered medications for this visit.     Review of Systems  Constitutional: positive for fatigue Eyes:  negative Ears, nose, mouth, throat, and face: negative Respiratory: positive for cough Cardiovascular: negative Gastrointestinal: negative Genitourinary:negative Integument/breast: negative Hematologic/lymphatic: negative Musculoskeletal:negative Neurological: negative Behavioral/Psych: negative Endocrine: negative Allergic/Immunologic: negative  Physical Exam  ZOX:WRUEA, healthy, no distress, well nourished and well developed SKIN: skin color, texture, turgor are normal, no rashes or significant lesions HEAD: Normocephalic, No masses, lesions, tenderness or abnormalities EYES: normal, PERRLA, Conjunctiva are pink and non-injected EARS: External ears normal, Canals clear OROPHARYNX:no exudate, no erythema and lips, buccal mucosa, and tongue normal  NECK: supple, no adenopathy, no JVD LYMPH:  no palpable lymphadenopathy, no hepatosplenomegaly LUNGS: clear to auscultation , and palpation HEART: regular rate & rhythm, no murmurs and no gallops ABDOMEN:abdomen soft, non-tender, normal bowel sounds and no masses or organomegaly BACK: Back symmetric, no curvature., No CVA tenderness EXTREMITIES:no joint deformities, effusion, or inflammation, no edema, no skin discoloration  NEURO: alert & oriented x 3 with fluent speech, no focal motor/sensory deficits  PERFORMANCE STATUS: ECOG 1  LABORATORY DATA: Lab Results  Component Value Date   WBC 5.9 01/04/2016   HGB 13.4 01/04/2016   HCT 40.9 01/04/2016   MCV 85.9 01/04/2016   PLT 85 (L) 01/04/2016      Chemistry      Component Value Date/Time   NA 139 01/04/2016 0850   NA 136 09/02/2012 1052   K 4.4 01/04/2016 0850   K 3.6 09/02/2012 1052   CL 112 (H) 01/04/2016 0850   CL 105 09/02/2012 1052   CO2 21 (L) 01/04/2016 0850   CO2 19 (L) 09/02/2012 1052   BUN 29 (H) 01/04/2016 0850   BUN 30.5 (H) 06/13/2014 1207   CREATININE 1.52 (H) 01/04/2016 0850   CREATININE 1.5 (H) 06/13/2014 1207      Component Value Date/Time    CALCIUM 9.2 01/04/2016 0850   CALCIUM 7.8 (L) 09/02/2012 1052   ALKPHOS 64 01/04/2016 0850   ALKPHOS 102 09/02/2012 1052   AST 17 01/04/2016 0850   AST 8 09/02/2012 1052   ALT 16 (L) 01/04/2016 0850   ALT 6 09/02/2012 1052   BILITOT 0.7 01/04/2016 0850   BILITOT 0.54 09/02/2012 1052       RADIOGRAPHIC STUDIES: Dg Chest 1 View  Result Date: 12/31/2015 CLINICAL DATA:  Status post biopsy EXAM: CHEST 1 VIEW COMPARISON:  PET-CT 11/22/2015. CT 11/01/2015. Chest x-ray 06/19/2015  and 12 02/2015. FINDINGS: Mediastinum hilar structures are unremarkable. Cardiomegaly with normal pulmonary vascularity. Low lung volumes with mild bibasilar atelectasis. Pleural parenchymal thickening again noted with bilateral pulmonary infiltrates as noted on prior exams. Reference is made to prior recent PET-CT report. No pleural effusion. No pneumothorax post biopsy . Gallstones noted. IMPRESSION: No pneumothorax post biopsy. Electronically Signed   By: Marcello Moores  Register   On: 12/31/2015 14:50  Dg Chest 2 View  Result Date: 01/04/2016 CLINICAL DATA:  History of lung cancer in 2014. Preoperative for lung biopsy. EXAM: CHEST  2 VIEW COMPARISON:  12/31/2015 FINDINGS: Cardiomediastinal silhouette is stably enlarged. Mediastinal contours appear intact. Atherosclerotic disease of the aorta. There is no evidence of pneumothorax. Chronic coarsening of the interstitial markings. Left upper lobe pulmonary mass is better seen on recent chest CT. No appreciable pleural effusion. Osseous structures are without acute abnormality. Soft tissues are grossly normal. IMPRESSION: Chronic interstitial lung disease. Left upper lobe mass poorly visualized. Electronically Signed   By: Fidela Salisbury M.D.   On: 01/04/2016 09:14  Ct Biopsy  Result Date: 12/31/2015 INDICATION: 81 year old with history of left lung cancer and status post radiation treatment. Recent PET CT raised concern for recurrence in the left upper lobe and along the left  chest wall. Request for biopsy of both areas. EXAM: CT-GUIDED CORE BIOPSY OF LEFT UPPER LOBE LESION CT-GUIDED CORE BIOPSY OF LEFT CHEST WALL/PLEURAL THICKENING MEDICATIONS: None. ANESTHESIA/SEDATION: Moderate (conscious) sedation was employed during this procedure. A total of Versed 0.5 mg and Fentanyl 25 mcg was administered intravenously. Moderate Sedation Time: 20 minutes. The patient's level of consciousness and vital signs were monitored continuously by radiology nursing throughout the procedure under my direct supervision. FLUOROSCOPY TIME:  None COMPLICATIONS: None immediate. PROCEDURE: Informed written consent was obtained from the patient after a thorough discussion of the procedural risks, benefits and alternatives. All questions were addressed. Maximal Sterile Barrier Technique was utilized including caps, mask, sterile gowns, sterile gloves, sterile drape, hand hygiene and skin antiseptic. A timeout was performed prior to the initiation of the procedure. Images through the chest were obtained. The left upper anterior chest was prepped and draped in sterile fashion. Skin was anesthetized with 1% lidocaine. Using CT guidance, 17 gauge coaxial needle was directed into the left upper lobe lesion. Needle placement was along the anterior aspect of the lesion. Two core biopsies obtained with an 18 gauge device. Specimens placed in formalin. The 17 gauge needle was removed without complication. The skin above the initial biopsy site was anesthetized with 1% lidocaine. A second incision was made. New 17 gauge needle was directed into the left anterior chest wall/ pleural thickening. The biopsy needle was directed towards the area which was hypermetabolic based on the previous PET-CT. The first 3 core biopsies yielded no tissue. The needle was redirected into the pleural thickening and a new core biopsy needle was obtained. Four additional core biopsies were obtained of the chest wall thickening. Specimens placed  in formalin. 17 gauge needle was removed without complication. Bandage placed over the puncture site. FINDINGS: Extensive pleural-parenchymal thickening throughout the left upper chest. The lesion along the inferior aspect of the parenchymal disease was biopsied. Core biopsies were also obtained of the chest wall/pleural thickening in the anterior left upper chest. No evidence for parenchymal hemorrhage or pneumothorax following the core biopsies. IMPRESSION: Successful CT-guided core biopsy of the left upper lobe lesion. Successful CT-guided core biopsy of the left anterior chest wall/pleural thickening. Electronically Signed   By: Quita Skye  Anselm Pancoast M.D.   On: 12/31/2015 18:04  Ct Biopsy  Result Date: 12/31/2015 INDICATION: 81 year old with history of left lung cancer and status post radiation treatment. Recent PET CT raised concern for recurrence in the left upper lobe and along the left chest wall. Request for biopsy of both areas. EXAM: CT-GUIDED CORE BIOPSY OF LEFT UPPER LOBE LESION CT-GUIDED CORE BIOPSY OF LEFT CHEST WALL/PLEURAL THICKENING MEDICATIONS: None. ANESTHESIA/SEDATION: Moderate (conscious) sedation was employed during this procedure. A total of Versed 0.5 mg and Fentanyl 25 mcg was administered intravenously. Moderate Sedation Time: 20 minutes. The patient's level of consciousness and vital signs were monitored continuously by radiology nursing throughout the procedure under my direct supervision. FLUOROSCOPY TIME:  None COMPLICATIONS: None immediate. PROCEDURE: Informed written consent was obtained from the patient after a thorough discussion of the procedural risks, benefits and alternatives. All questions were addressed. Maximal Sterile Barrier Technique was utilized including caps, mask, sterile gowns, sterile gloves, sterile drape, hand hygiene and skin antiseptic. A timeout was performed prior to the initiation of the procedure. Images through the chest were obtained. The left upper anterior  chest was prepped and draped in sterile fashion. Skin was anesthetized with 1% lidocaine. Using CT guidance, 17 gauge coaxial needle was directed into the left upper lobe lesion. Needle placement was along the anterior aspect of the lesion. Two core biopsies obtained with an 18 gauge device. Specimens placed in formalin. The 17 gauge needle was removed without complication. The skin above the initial biopsy site was anesthetized with 1% lidocaine. A second incision was made. New 17 gauge needle was directed into the left anterior chest wall/ pleural thickening. The biopsy needle was directed towards the area which was hypermetabolic based on the previous PET-CT. The first 3 core biopsies yielded no tissue. The needle was redirected into the pleural thickening and a new core biopsy needle was obtained. Four additional core biopsies were obtained of the chest wall thickening. Specimens placed in formalin. 17 gauge needle was removed without complication. Bandage placed over the puncture site. FINDINGS: Extensive pleural-parenchymal thickening throughout the left upper chest. The lesion along the inferior aspect of the parenchymal disease was biopsied. Core biopsies were also obtained of the chest wall/pleural thickening in the anterior left upper chest. No evidence for parenchymal hemorrhage or pneumothorax following the core biopsies. IMPRESSION: Successful CT-guided core biopsy of the left upper lobe lesion. Successful CT-guided core biopsy of the left anterior chest wall/pleural thickening. Electronically Signed   By: Markus Daft M.D.   On: 12/31/2015 18:04   ASSESSMENT: This is a very pleasant 81 years old white male with history of stage IA non-small cell lung cancer, squamous cell carcinoma of the left upper lobe status post stereotactic radiotherapy and the patient presenting with new or recurrent non-small cell lung cancer, adenocarcinoma questionable for stage IA presented again with left upper lobe lung  nodule. He is not a good surgical candidate for resection.   PLAN: I had a lengthy discussion with the patient and his family about his current disease stage, prognosis and treatment options. I explained to the patient that treatment with stereotactic radiotherapy would be a good alternative for surgical resection for his new or recurrent non-small cell lung cancer. He was seen by Dr. Sondra Come and expected to start this treatment next week. I would also ask the pathology department to send his tissue for molecular testing for consideration of future treatment with targeted therapy if the patient has targetable mutation. The patient and his family  agreed to the current option. I would see him back for follow-up visit in 6 months with repeat CT scan of the chest for restaging of his disease. He was advised to call immediately if he has any concerning symptoms in the interval.  The patient voices understanding of current disease status and treatment options and is in agreement with the current care plan.  All questions were answered. The patient knows to call the clinic with any problems, questions or concerns. We can certainly see the patient much sooner if necessary.  Thank you so much for allowing me to participate in the care of [facility]. I will continue to follow up the patient with you and assist in his care.  I spent 40 minutes counseling the patient face to face. The total time spent in the appointment was 60 minutes.  Disclaimer: This note was dictated with voice recognition software. Similar sounding words can inadvertently be transcribed and may not be corrected upon review.   Veta Dambrosia K. January 15, 2016, 1:47 PM

## 2016-01-15 NOTE — Progress Notes (Signed)
  Radiation Oncology         [phone] ________________________________  Name: Brandon Clay MRN: [id number]  Date: 01/15/2016  DOB: [birth year]  STEREOTACTIC BODY RADIOTHERAPY SIMULATION AND TREATMENT PLANNING NOTE    ICD-9-CM ICD-10-CM   1. Adenocarcinoma of left lung, stage 1 (HCC) 162.9 C34.92     DIAGNOSIS: Clinical stage I adenocarcinoma of the left upper lung, prior setting of squamous cell carcinoma of the left upper lung  NARRATIVE:  The patient was brought to the [place].  Identity was confirmed.  All relevant records and images related to the planned course of therapy were reviewed.  The patient freely provided informed written consent to proceed with treatment after reviewing the details related to the planned course of therapy. The consent form was witnessed and verified by the simulation staff.  Then, the patient was set-up in a stable reproducible  supine position for radiation therapy.  A BodyFix immobilization pillow was fabricated for reproducible positioning.  Then I personally applied the abdominal compression paddle to limit respiratory excursion.  4D respiratoy motion management CT images were obtained.  Surface markings were placed.  The CT images were loaded into the planning software.  Then, using Cine, MIP, and standard views, the internal target volume (ITV) and planning target volumes (PTV) were delinieated, and avoidance structures were contoured.  Treatment planning then occurred.  The radiation prescription was entered and confirmed.  A total of two complex treatment devices were fabricated in the form of the BodyFix immobilization pillow and a neck accuform cushion.  I have requested : 3D Simulation  I have requested a DVH of the following structures: Heart, Lungs, Esophagus, Chest Wall, Brachial Plexus, Major Blood Vessels, and targets.  PLAN:  The patient will receive 54 Gy in 3 fractions.  -----------------------------------  Blair Promise, PhD, MD  This document serves as a record of services personally performed by Gery Pray, MD. It was created on his behalf by Darcus Austin, a trained medical scribe. The creation of this record is based on the scribe's personal observations and the provider's statements to them. This document has been checked and approved by the attending provider.

## 2016-01-25 ENCOUNTER — Encounter (HOSPITAL_COMMUNITY): Payer: Self-pay

## 2016-01-29 ENCOUNTER — Ambulatory Visit
Admission: RE | Admit: 2016-01-29 | Discharge: 2016-01-29 | Disposition: A | Discharge status: Home / Self Care | Payer: Medicare Other | Source: Ambulatory Visit | Attending: Radiation Oncology | Admitting: Radiation Oncology

## 2016-01-29 DIAGNOSIS — C3492 Malignant neoplasm of unspecified part of left bronchus or lung: Secondary | ICD-10-CM | POA: Diagnosis not present

## 2016-01-29 NOTE — Progress Notes (Signed)
  Radiation Oncology         [phone] ________________________________  Name: Brandon Clay MRN: [id number]  Date: 01/29/2016  DOB: 08/11/1934  Stereotactic Body Radiotherapy Treatment Procedure Note  NARRATIVE:  Brandon Clay was brought to the stereotactic radiation treatment machine and placed supine on the CT couch. The patient was set up for stereotactic body radiotherapy on the body fix pillow.  3D TREATMENT PLANNING AND DOSIMETRY:  The patient's radiation plan was reviewed and approved prior to starting treatment.  It showed 3-dimensional radiation distributions overlaid onto the planning CT.  The [facility] for the target structures as well as the organs at risk were reviewed. The documentation of this is filed in the radiation oncology EMR.  SIMULATION VERIFICATION:  The patient underwent CT imaging on the treatment unit.  These were carefully aligned to document that the ablative radiation dose would cover the target volume and maximally spare the nearby organs at risk according to the planned distribution.  SPECIAL TREATMENT PROCEDURE: Brandon Clay received high dose ablative stereotactic body radiotherapy to the planned target volume without unforeseen complications. Treatment was delivered uneventfully. The high doses associated with stereotactic body radiotherapy and the significant potential risks require careful treatment set up and patient monitoring constituting a special treatment procedure   STEREOTACTIC TREATMENT MANAGEMENT:  Following delivery, the patient was evaluated clinically. The patient tolerated treatment without significant acute effects, and was discharged to home in stable condition.    PLAN: Continue treatment as planned.  ________________________________  Blair Promise, PhD, MD

## 2016-01-31 ENCOUNTER — Ambulatory Visit
Admission: RE | Admit: 2016-01-31 | Discharge: 2016-01-31 | Disposition: A | Discharge status: Home / Self Care | Payer: Medicare Other | Source: Ambulatory Visit | Attending: Radiation Oncology | Admitting: Radiation Oncology

## 2016-01-31 VITALS — BP 174/74 | HR 53 | Temp 97.9°F | Ht 70.0 in | Wt 178.7 lb

## 2016-01-31 DIAGNOSIS — C3492 Malignant neoplasm of unspecified part of left bronchus or lung: Secondary | ICD-10-CM | POA: Diagnosis not present

## 2016-01-31 NOTE — Progress Notes (Signed)
  Radiation Oncology         [phone] ________________________________  Name: Brandon Clay MRN: [id number]  Date: 01/31/2016  DOB: 26-May-1935  Weekly Radiation Therapy Management      ICD-9-CM ICD-10-CM   1. Adenocarcinoma of left lung, stage 1 (HCC) 162.9 C34.92      Current Dose: 24 Gy     Planned Dose:  60 Gy  Narrative . . . . . . . . The patient presents for routine under treatment assessment.                       The patient has completed 2 fractions to his left upper lung.  He denies having pain or coughing.  He reports having shortness of breath with walking and a good appetite                                 Set-up films were reviewed.                                 The chart was checked. Physical Findings. . .  height is '5\' 10"'$  (1.778 m) and weight is 178 lb 11.2 oz (81.1 kg). His oral temperature is 97.9 F (36.6 C). His blood pressure is 174/74 (abnormal) and his pulse is 53 (abnormal). His oxygen saturation is 96%.   Lungs are clear to auscultation bilaterally. Heart has regular  Rhythm Mildly decreased rate. Impression . . . . . . . The patient is tolerating radiation. Plan . . . . . . . . . . . . Continue treatment as planned.  ________________________________   Blair Promise, PhD, MD  This document serves as a record of services personally performed by Gery Pray, MD. It was created on his behalf by Darcus Austin, a trained medical scribe. The creation of this record is based on the scribe's personal observations and the provider's statements to them. This document has been checked and approved by the attending provider.

## 2016-01-31 NOTE — Progress Notes (Signed)
Dasean Brow has completed 2 fractions to his left upper lung.  He denies having pain or coughing.  He reports having shortness of breath with walking.  He reports having a good appetite.  His bp was elevated today at 174/74.  BP (!) 174/74 (BP Location: Left Arm, Patient Position: Sitting)   Pulse (!) 53   Temp 97.9 F (36.6 C) (Oral)   Ht '5\' 10"'$  (1.778 m)   Wt 178 lb 11.2 oz (81.1 kg)   SpO2 96%   BMI 25.64 kg/m    Wt Readings from Last 3 Encounters:  01/31/16 178 lb 11.2 oz (81.1 kg)  01/15/16 175 lb 3.2 oz (79.5 kg)  01/09/16 178 lb 4.8 oz (80.9 kg)

## 2016-01-31 NOTE — Progress Notes (Signed)
  Radiation Oncology         [phone] ________________________________  Name: Brandon Clay MRN: [id number]  Date: 01/31/2016  DOB: 16-Nov-1934  Stereotactic Body Radiotherapy Treatment Procedure Note  NARRATIVE:  Brandon Clay was brought to the stereotactic radiation treatment machine and placed supine on the CT couch. The patient was set up for stereotactic body radiotherapy on the body fix pillow.  3D TREATMENT PLANNING AND DOSIMETRY:  The patient's radiation plan was reviewed and approved prior to starting treatment.  It showed 3-dimensional radiation distributions overlaid onto the planning CT.  [facility] for the target structures as well as the organs at risk were reviewed. The documentation of this is filed in the radiation oncology EMR.  SIMULATION VERIFICATION:  The patient underwent CT imaging on the treatment unit.  These were carefully aligned to document that the ablative radiation dose would cover the target volume and maximally spare the nearby organs at risk according to the planned distribution.  SPECIAL TREATMENT PROCEDURE: Brandon Clay received high dose ablative stereotactic body radiotherapy to the planned target volume without unforeseen complications. Treatment was delivered uneventfully. The high doses associated with stereotactic body radiotherapy and the significant potential risks require careful treatment set up and patient monitoring constituting a special treatment procedure   STEREOTACTIC TREATMENT MANAGEMENT:  Following delivery, the patient was evaluated clinically. The patient tolerated treatment without significant acute effects, and was discharged to home in stable condition.    PLAN: Continue treatment as planned.  ________________________________  Blair Promise, PhD, MD

## 2016-02-04 ENCOUNTER — Ambulatory Visit
Admission: RE | Admit: 2016-02-04 | Discharge: 2016-02-04 | Disposition: A | Discharge status: Home / Self Care | Payer: Medicare Other | Source: Ambulatory Visit | Attending: Radiation Oncology | Admitting: Radiation Oncology

## 2016-02-04 DIAGNOSIS — C3492 Malignant neoplasm of unspecified part of left bronchus or lung: Secondary | ICD-10-CM | POA: Diagnosis not present

## 2016-02-05 ENCOUNTER — Encounter: Payer: Self-pay | Admitting: Vascular Surgery

## 2016-02-06 ENCOUNTER — Ambulatory Visit
Admission: RE | Admit: 2016-02-06 | Discharge: 2016-02-06 | Disposition: A | Discharge status: Home / Self Care | Payer: Medicare Other | Source: Ambulatory Visit | Attending: Radiation Oncology | Admitting: Radiation Oncology

## 2016-02-06 ENCOUNTER — Encounter: Payer: Self-pay | Admitting: Radiation Oncology

## 2016-02-06 VITALS — BP 137/79 | HR 76 | Temp 97.8°F | Ht 70.0 in | Wt 174.5 lb

## 2016-02-06 DIAGNOSIS — C3492 Malignant neoplasm of unspecified part of left bronchus or lung: Secondary | ICD-10-CM

## 2016-02-06 LAB — GLUCOSE, CAPILLARY: Glucose-Capillary: 317 mg/dL — ABNORMAL HIGH (ref 65–99)

## 2016-02-06 NOTE — Progress Notes (Signed)
  Radiation Oncology         [phone] ________________________________  Name: BRAZOS SANDOVAL MRN: [id number]  Date: 02/06/2016  DOB: [birth year]  Stereotactic Body Radiotherapy Treatment Procedure Note  NARRATIVE:  OMAURI BOEVE was brought to the stereotactic radiation treatment machine and placed supine on the CT couch. The patient was set up for stereotactic body radiotherapy on the body fix pillow.  3D TREATMENT PLANNING AND DOSIMETRY:  The patient's radiation plan was reviewed and approved prior to starting treatment.  It showed 3-dimensional radiation distributions overlaid onto the planning CT.  [facility] for the target structures as well as the organs at risk were reviewed. The documentation of this is filed in the radiation oncology EMR.  SIMULATION VERIFICATION:  The patient underwent CT imaging on the treatment unit.  These were carefully aligned to document that the ablative radiation dose would cover the target volume and maximally spare the nearby organs at risk according to the planned distribution.  SPECIAL TREATMENT PROCEDURE: Malachy Chamber Kaster received high dose ablative stereotactic body radiotherapy to the planned target volume without unforeseen complications. Treatment was delivered uneventfully. The high doses associated with stereotactic body radiotherapy and the significant potential risks require careful treatment set up and patient monitoring constituting a special treatment procedure   STEREOTACTIC TREATMENT MANAGEMENT:  Following delivery, the patient was evaluated clinically. The patient tolerated treatment without significant acute effects, and was discharged to home in stable condition.    PLAN: Continue treatment as planned.  ________________________________  Blair Promise, PhD, MD  This document serves as a record of services personally performed by Gery Pray, MD. It was created on his behalf by Darcus Austin, a trained medical scribe. The creation of  this record is based on the scribe's personal observations and the provider's statements to them. This document has been checked and approved by the attending provider.

## 2016-02-06 NOTE — Progress Notes (Signed)
  Radiation Oncology         (336) [phone] ________________________________  Name: Brandon Clay MRN: [id number]  Date: 02/06/2016  DOB: 1935/01/18  Weekly Radiation Therapy Management      ICD-9-CM ICD-10-CM   1. Adenocarcinoma of left lung, stage 1 (HCC) 162.9 C34.92      Current Dose: 48 Gy     Planned Dose:  60 Gy  Narrative . . . . . . . . The patient presents for an unscheduled under treatment assessment.                       Brandon Clay has completed 4 fractions.  After his treatment today, he slipped and sat down by the treatment table.  The radiation therapists said he was disoriented.  He reports he is feeling fine.  Orthostatic vitals taken, bp sitting 150/37, hr 70, bp standing 137/79, hr 76.  He denies having any pain.  He denies having any shortness of breath or a cough.  His daughter is concerned that his blood sugar may be low.  Brandon Clay does not want his blood sugar checked and said he feels fine.                                 Set-up films were reviewed.                                 The chart was checked. Physical Findings. . .  height is '5\' 10"'$  (1.778 m) and weight is 174 lb 8 oz (79.2 kg). His oral temperature is 97.8 F (36.6 C). His blood pressure is 137/79 and his pulse is 76. His oxygen saturation is 95%.   Lungs are clear to auscultation bilaterally. Heart has regular rate and rhythm. No palpable cervical, supraclavicular, or axillary adenopathy. Impression . . . . . . . The patient is tolerating radiation. Plan . . . . . . . . . . . . Continue treatment as planned. We will get his blood sugar today.([phone]) ________________________________   Blair Promise, PhD, MD  This document serves as a record of services personally performed by Gery Pray, MD. It was created on his behalf by Darcus Austin, a trained medical scribe. The creation of this record is based on the scribe's personal observations and the provider's statements to them. This document has  been checked and approved by the attending provider.

## 2016-02-06 NOTE — Progress Notes (Signed)
Brandon Clay has completed 4 for put.  After his treatment today, he slipped and sat down by the treatment table.  The radiation therapists said he was disoriented.  He reports he is feeling fine.  Orthostatic vitals taken, bp sitting 150/37, hr 70, bp standing 137/79, hr 76.  He denies having any pain.  He denies having any shortness of breath or a cough.  His daughter is concerned that his blood sugar may be low as he takes Lasix twice a day.  [name] does not want his blood sugar checked and said he feels fine.  BP 137/79 (BP Location: Right Arm, Patient Position: Standing)   Pulse 76   Temp 97.8 F (36.6 C) (Oral)   Ht '5\' 10"'$  (1.778 m)   Wt 174 lb 8 oz (79.2 kg)   SpO2 95%   BMI 25.04 kg/m    Wt Readings from Last 3 Encounters:  02/06/16 174 lb 8 oz (79.2 kg)  01/31/16 178 lb 11.2 oz (81.1 kg)  01/15/16 175 lb 3.2 oz (79.5 kg)

## 2016-02-06 NOTE — Progress Notes (Signed)
Brandon Clay's blood sugar was 317.  Notified Dr. Sondra Come.

## 2016-02-08 ENCOUNTER — Ambulatory Visit
Admission: RE | Admit: 2016-02-08 | Discharge: 2016-02-08 | Disposition: A | Discharge status: Home / Self Care | Payer: Medicare Other | Source: Ambulatory Visit | Attending: Radiation Oncology | Admitting: Radiation Oncology

## 2016-02-08 ENCOUNTER — Encounter: Payer: Self-pay | Admitting: Radiation Oncology

## 2016-02-08 DIAGNOSIS — C3492 Malignant neoplasm of unspecified part of left bronchus or lung: Secondary | ICD-10-CM | POA: Diagnosis not present

## 2016-02-19 NOTE — Addendum Note (Signed)
Encounter addended by: Jacqulyn Liner, RN on: 02/19/2016  4:34 PM<BR>    Actions taken: Charge Capture section accepted

## 2016-03-05 NOTE — Progress Notes (Signed)
  Radiation Oncology         [phone] ________________________________  Name: Brandon Clay MRN: [id number]  Date: 02/08/2016  DOB: [birth year]  End of Treatment Note  Diagnosis:   Clinical stage I adenocarcinoma of the left upper lung, prior setting of squamous cell carcinoma of the left upper lung     Indication for treatment:  Curative  Radiation treatment dates:   01/29/16 - 02/08/16  Site/dose:   Left upper lung: 60 Gy in 5 fractions.  Beams/energy:   SBRT/SRT-3D // 6FFF Photon  Narrative: The patient tolerated radiation treatment relatively well. However, after this 4th treatment, the patient slipped and was reported to be disoriented by the radiation therapists. His BP and HR sitting was 150/37 and 70. His BP and HR standing was 137/79 and 76. His blood sugar was checked and it was 317. Recommendations were made concerning this elevated blood sugar.  Plan: The patient has completed radiation treatment. The patient will return to radiation oncology clinic for routine followup in one month. I advised them to call or return sooner if they have any questions or concerns related to their recovery or treatment.  -----------------------------------  Blair Promise, PhD, MD  This document serves as a record of services personally performed by Gery Pray, MD. It was created on his behalf by Darcus Austin, a trained medical scribe. The creation of this record is based on the scribe's personal observations and the provider's statements to them. This document has been checked and approved by the attending provider.

## 2016-03-07 ENCOUNTER — Encounter (HOSPITAL_COMMUNITY): Payer: Medicare Other

## 2016-03-07 ENCOUNTER — Ambulatory Visit: Payer: Medicare Other | Admitting: Vascular Surgery

## 2016-03-17 ENCOUNTER — Encounter: Payer: Self-pay | Admitting: Oncology

## 2016-03-20 ENCOUNTER — Telehealth: Payer: Self-pay | Admitting: *Deleted

## 2016-03-20 ENCOUNTER — Encounter: Payer: Self-pay | Admitting: Radiation Oncology

## 2016-03-20 ENCOUNTER — Telehealth: Payer: Self-pay | Admitting: Oncology

## 2016-03-20 ENCOUNTER — Ambulatory Visit
Admission: RE | Admit: 2016-03-20 | Discharge: 2016-03-20 | Disposition: A | Discharge status: Home / Self Care | Payer: Medicare Other | Source: Ambulatory Visit | Attending: Radiation Oncology | Admitting: Radiation Oncology

## 2016-03-20 VITALS — BP 168/80 | HR 54 | Temp 97.6°F | Ht 70.0 in | Wt 178.2 lb

## 2016-03-20 DIAGNOSIS — Z923 Personal history of irradiation: Secondary | ICD-10-CM | POA: Insufficient documentation

## 2016-03-20 DIAGNOSIS — Z7901 Long term (current) use of anticoagulants: Secondary | ICD-10-CM | POA: Diagnosis not present

## 2016-03-20 DIAGNOSIS — Z794 Long term (current) use of insulin: Secondary | ICD-10-CM | POA: Diagnosis not present

## 2016-03-20 DIAGNOSIS — C3492 Malignant neoplasm of unspecified part of left bronchus or lung: Secondary | ICD-10-CM | POA: Insufficient documentation

## 2016-03-20 DIAGNOSIS — Z7982 Long term (current) use of aspirin: Secondary | ICD-10-CM | POA: Diagnosis not present

## 2016-03-20 DIAGNOSIS — Y842 Radiological procedure and radiotherapy as the cause of abnormal reaction of the patient, or of later complication, without mention of misadventure at the time of the procedure: Secondary | ICD-10-CM | POA: Insufficient documentation

## 2016-03-20 DIAGNOSIS — Z79899 Other long term (current) drug therapy: Secondary | ICD-10-CM | POA: Diagnosis not present

## 2016-03-20 NOTE — Telephone Encounter (Signed)
Left a message regarding Brandon Clay's appointment today with Dr. Sondra Come.  Requested a return call.

## 2016-03-20 NOTE — Telephone Encounter (Signed)
CALLED PATIENT TO INFORM OF FU ON 07-22-16 @ 10 AM WITH DR. KINARD, LVM FOR A RETURN CALL

## 2016-03-20 NOTE — Progress Notes (Signed)
Brandon Clay here for follow up.  He denies having pain, shortness of breath, cough or fatigue.  He reports having a good appetite.  BP (!) 179/59 (BP Location: Left Arm, Patient Position: Sitting)   Pulse (!) 52   Temp 97.6 F (36.4 C) (Oral)   Ht '5\' 10"'$  (1.778 m)   Wt 178 lb 3.2 oz (80.8 kg)   SpO2 96%   BMI 25.57 kg/m    Wt Readings from Last 3 Encounters:  03/20/16 178 lb 3.2 oz (80.8 kg)  02/06/16 174 lb 8 oz (79.2 kg)  01/31/16 178 lb 11.2 oz (81.1 kg)

## 2016-03-20 NOTE — Progress Notes (Signed)
Radiation Oncology         (336) [phone] ________________________________  Name: Brandon Clay MRN: [id number]  Date: 03/20/2016  DOB: 01-30-1935  Follow-Up Visit Note  CC: Cyndi Bender, PA-C  Grace Isaac, MD    ICD-9-CM ICD-10-CM   1. Squamous cell carcinoma of left lung (HCC) 162.9 C34.92   2. Adenocarcinoma of left lung, stage 1 (HCC) 162.9 C34.92     Diagnosis:   Clinical stage I adenocarcinoma of the left upper lung, prior setting of squamous cell carcinoma of the left upper lung  Interval Since Last Radiation: 1 month, 2 weeks  SBRT treatment 01/29/16-02/08/16  Narrative:  The patient returns today for routine follow-up.  He denies having pain, shortness of breath, wheezing, cough, or fatigue. He reports a good appetite.  His daughter reports "he spit up a little bit of blood" following the final treatment.                            ALLERGIES:  is allergic to no known allergies.  Meds: Current Outpatient Prescriptions  Medication Sig Dispense Refill  . aspirin EC 81 MG tablet Take 81 mg by mouth every other day.    . digoxin (LANOXIN) 0.125 MG tablet Take 1 tablet (0.125 mg total) by mouth daily. (Patient taking differently: Take 0.0625 mg by mouth daily. ) 30 tablet 0  . furosemide (LASIX) 20 MG tablet Take 20 mg by mouth daily.     . insulin glargine (LANTUS) 100 UNIT/ML injection Inject 16 Units into the skin 2 (two) times daily.    Marland Kitchen lisinopril (PRINIVIL,ZESTRIL) 10 MG tablet Take 10 mg by mouth daily.     . metoprolol tartrate (LOPRESSOR) 25 MG tablet Take 1 tablet (25 mg total) by mouth 2 (two) times daily. (Patient taking differently: Take 12.5 mg by mouth 2 (two) times daily. ) 60 tablet 0  . Multiple Vitamin (MULTIVITAMIN WITH MINERALS) TABS Take 1 tablet by mouth daily. 30 tablet 0  . potassium chloride SA (K-DUR,KLOR-CON) 20 MEQ tablet Take 20 mEq by mouth daily.     Marland Kitchen warfarin (COUMADIN) 5 MG tablet Take 7.5-10 mg by mouth See admin instructions. Mon and  Fri 7.5 mg, 10 mg all other days    . metFORMIN (GLUCOPHAGE-XR) 500 MG 24 hr tablet Take 500 mg by mouth daily with breakfast.      No current facility-administered medications for this encounter.     Physical Findings: The patient is in no acute distress. Patient is alert and oriented.  height is '5\' 10"'$  (1.778 m) and weight is 178 lb 3.2 oz (80.8 kg). His oral temperature is 97.6 F (36.4 C). His blood pressure is 168/80 (abnormal) and his pulse is 54 (abnormal). His oxygen saturation is 96%. .   Lungs are clear to auscultation bilaterally. Heart has regular rate and rhythm. No palpable cervical, supraclavicular, or axillary adenopathy. Abdomen soft, non-tender, normal bowel sounds.   Lab Findings: Lab Results  Component Value Date   WBC 5.9 01/04/2016   HGB 13.4 01/04/2016   HCT 40.9 01/04/2016   MCV 85.9 01/04/2016   PLT 85 (L) 01/04/2016    Radiographic Findings: No results found.  Impression:  Clinical stage I adenocarcinoma of the left upper lung, prior setting of squamous cell carcinoma of the left upper lung. The patient is recovering from the effects of radiation.  He is doing well without any obvious side effects from his SBRT.  Plan:  CT scan scheduled in 3 months, expected to be February 5th. Patient will follow up with Dr. Julien Nordmann and I on February 13th.  ____________________________________  This document serves as a record of services personally performed by Gery Pray, MD. It was created on his behalf by Bethann Humble, a trained medical scribe. The creation of this record is based on the scribe's personal observations and the provider's statements to them. This document has been checked and approved by the attending provider.

## 2016-04-24 ENCOUNTER — Ambulatory Visit: Payer: Self-pay | Admitting: Radiation Oncology

## 2016-05-10 DIAGNOSIS — Z7901 Long term (current) use of anticoagulants: Secondary | ICD-10-CM

## 2016-05-10 DIAGNOSIS — N390 Urinary tract infection, site not specified: Secondary | ICD-10-CM

## 2016-05-10 DIAGNOSIS — R0902 Hypoxemia: Secondary | ICD-10-CM

## 2016-05-10 DIAGNOSIS — J209 Acute bronchitis, unspecified: Secondary | ICD-10-CM

## 2016-05-10 DIAGNOSIS — E119 Type 2 diabetes mellitus without complications: Secondary | ICD-10-CM

## 2016-05-10 DIAGNOSIS — R531 Weakness: Secondary | ICD-10-CM

## 2016-05-11 DIAGNOSIS — J209 Acute bronchitis, unspecified: Secondary | ICD-10-CM | POA: Diagnosis not present

## 2016-05-11 DIAGNOSIS — E119 Type 2 diabetes mellitus without complications: Secondary | ICD-10-CM | POA: Diagnosis not present

## 2016-05-11 DIAGNOSIS — I959 Hypotension, unspecified: Secondary | ICD-10-CM

## 2016-05-11 DIAGNOSIS — N390 Urinary tract infection, site not specified: Secondary | ICD-10-CM | POA: Diagnosis not present

## 2016-05-11 DIAGNOSIS — R531 Weakness: Secondary | ICD-10-CM | POA: Diagnosis not present

## 2016-05-11 DIAGNOSIS — D696 Thrombocytopenia, unspecified: Secondary | ICD-10-CM

## 2016-05-12 DIAGNOSIS — E119 Type 2 diabetes mellitus without complications: Secondary | ICD-10-CM | POA: Diagnosis not present

## 2016-05-12 DIAGNOSIS — R531 Weakness: Secondary | ICD-10-CM | POA: Diagnosis not present

## 2016-05-12 DIAGNOSIS — J209 Acute bronchitis, unspecified: Secondary | ICD-10-CM | POA: Diagnosis not present

## 2016-05-12 DIAGNOSIS — I5023 Acute on chronic systolic (congestive) heart failure: Secondary | ICD-10-CM

## 2016-05-12 DIAGNOSIS — N390 Urinary tract infection, site not specified: Secondary | ICD-10-CM | POA: Diagnosis not present

## 2016-05-12 DIAGNOSIS — J189 Pneumonia, unspecified organism: Secondary | ICD-10-CM

## 2016-05-13 DIAGNOSIS — E119 Type 2 diabetes mellitus without complications: Secondary | ICD-10-CM | POA: Diagnosis not present

## 2016-05-13 DIAGNOSIS — J209 Acute bronchitis, unspecified: Secondary | ICD-10-CM | POA: Diagnosis not present

## 2016-05-13 DIAGNOSIS — R531 Weakness: Secondary | ICD-10-CM | POA: Diagnosis not present

## 2016-05-13 DIAGNOSIS — N390 Urinary tract infection, site not specified: Secondary | ICD-10-CM | POA: Diagnosis not present

## 2016-05-14 DIAGNOSIS — I959 Hypotension, unspecified: Secondary | ICD-10-CM

## 2016-05-14 DIAGNOSIS — I5023 Acute on chronic systolic (congestive) heart failure: Secondary | ICD-10-CM | POA: Diagnosis not present

## 2016-05-14 DIAGNOSIS — J9600 Acute respiratory failure, unspecified whether with hypoxia or hypercapnia: Secondary | ICD-10-CM

## 2016-05-14 DIAGNOSIS — Z7901 Long term (current) use of anticoagulants: Secondary | ICD-10-CM

## 2016-05-14 DIAGNOSIS — J189 Pneumonia, unspecified organism: Secondary | ICD-10-CM

## 2016-05-14 DIAGNOSIS — I4891 Unspecified atrial fibrillation: Secondary | ICD-10-CM

## 2016-05-14 DIAGNOSIS — R531 Weakness: Secondary | ICD-10-CM

## 2016-05-14 DIAGNOSIS — D638 Anemia in other chronic diseases classified elsewhere: Secondary | ICD-10-CM

## 2016-05-14 DIAGNOSIS — R791 Abnormal coagulation profile: Secondary | ICD-10-CM

## 2016-05-14 DIAGNOSIS — E119 Type 2 diabetes mellitus without complications: Secondary | ICD-10-CM

## 2016-05-14 DIAGNOSIS — M249 Joint derangement, unspecified: Secondary | ICD-10-CM

## 2016-05-14 DIAGNOSIS — J209 Acute bronchitis, unspecified: Secondary | ICD-10-CM

## 2016-05-14 DIAGNOSIS — R0902 Hypoxemia: Secondary | ICD-10-CM

## 2016-05-15 DIAGNOSIS — J189 Pneumonia, unspecified organism: Secondary | ICD-10-CM | POA: Diagnosis not present

## 2016-05-15 DIAGNOSIS — I5023 Acute on chronic systolic (congestive) heart failure: Secondary | ICD-10-CM | POA: Diagnosis not present

## 2016-05-15 DIAGNOSIS — R0902 Hypoxemia: Secondary | ICD-10-CM | POA: Diagnosis not present

## 2016-05-15 DIAGNOSIS — J209 Acute bronchitis, unspecified: Secondary | ICD-10-CM | POA: Diagnosis not present

## 2016-05-16 DIAGNOSIS — J209 Acute bronchitis, unspecified: Secondary | ICD-10-CM | POA: Diagnosis not present

## 2016-05-16 DIAGNOSIS — J189 Pneumonia, unspecified organism: Secondary | ICD-10-CM | POA: Diagnosis not present

## 2016-05-16 DIAGNOSIS — I5023 Acute on chronic systolic (congestive) heart failure: Secondary | ICD-10-CM | POA: Diagnosis not present

## 2016-05-16 DIAGNOSIS — R0902 Hypoxemia: Secondary | ICD-10-CM | POA: Diagnosis not present

## 2016-05-17 DIAGNOSIS — R0902 Hypoxemia: Secondary | ICD-10-CM | POA: Diagnosis not present

## 2016-05-17 DIAGNOSIS — I5023 Acute on chronic systolic (congestive) heart failure: Secondary | ICD-10-CM | POA: Diagnosis not present

## 2016-05-17 DIAGNOSIS — J209 Acute bronchitis, unspecified: Secondary | ICD-10-CM | POA: Diagnosis not present

## 2016-05-17 DIAGNOSIS — J189 Pneumonia, unspecified organism: Secondary | ICD-10-CM | POA: Diagnosis not present

## 2016-05-18 DIAGNOSIS — R0902 Hypoxemia: Secondary | ICD-10-CM | POA: Diagnosis not present

## 2016-05-18 DIAGNOSIS — J189 Pneumonia, unspecified organism: Secondary | ICD-10-CM | POA: Diagnosis not present

## 2016-05-18 DIAGNOSIS — J209 Acute bronchitis, unspecified: Secondary | ICD-10-CM | POA: Diagnosis not present

## 2016-05-18 DIAGNOSIS — I5023 Acute on chronic systolic (congestive) heart failure: Secondary | ICD-10-CM | POA: Diagnosis not present

## 2016-05-19 DIAGNOSIS — J189 Pneumonia, unspecified organism: Secondary | ICD-10-CM | POA: Diagnosis not present

## 2016-05-19 DIAGNOSIS — J209 Acute bronchitis, unspecified: Secondary | ICD-10-CM | POA: Diagnosis not present

## 2016-05-19 DIAGNOSIS — R0902 Hypoxemia: Secondary | ICD-10-CM | POA: Diagnosis not present

## 2016-05-19 DIAGNOSIS — I5023 Acute on chronic systolic (congestive) heart failure: Secondary | ICD-10-CM | POA: Diagnosis not present

## 2016-05-20 DIAGNOSIS — I5023 Acute on chronic systolic (congestive) heart failure: Secondary | ICD-10-CM | POA: Diagnosis not present

## 2016-05-20 DIAGNOSIS — R0902 Hypoxemia: Secondary | ICD-10-CM | POA: Diagnosis not present

## 2016-05-20 DIAGNOSIS — J189 Pneumonia, unspecified organism: Secondary | ICD-10-CM | POA: Diagnosis not present

## 2016-05-20 DIAGNOSIS — J209 Acute bronchitis, unspecified: Secondary | ICD-10-CM | POA: Diagnosis not present

## 2016-06-10 DIAGNOSIS — R531 Weakness: Secondary | ICD-10-CM | POA: Diagnosis not present

## 2016-06-10 DIAGNOSIS — Z9981 Dependence on supplemental oxygen: Secondary | ICD-10-CM | POA: Diagnosis not present

## 2016-06-10 DIAGNOSIS — N3000 Acute cystitis without hematuria: Secondary | ICD-10-CM | POA: Diagnosis not present

## 2016-06-10 DIAGNOSIS — Z794 Long term (current) use of insulin: Secondary | ICD-10-CM | POA: Diagnosis not present

## 2016-06-10 DIAGNOSIS — I4891 Unspecified atrial fibrillation: Secondary | ICD-10-CM | POA: Diagnosis not present

## 2016-06-10 DIAGNOSIS — Z7901 Long term (current) use of anticoagulants: Secondary | ICD-10-CM | POA: Diagnosis not present

## 2016-06-10 DIAGNOSIS — F329 Major depressive disorder, single episode, unspecified: Secondary | ICD-10-CM | POA: Diagnosis not present

## 2016-06-10 DIAGNOSIS — E119 Type 2 diabetes mellitus without complications: Secondary | ICD-10-CM | POA: Diagnosis not present

## 2016-06-10 DIAGNOSIS — I5023 Acute on chronic systolic (congestive) heart failure: Secondary | ICD-10-CM | POA: Diagnosis not present

## 2016-06-10 DIAGNOSIS — J189 Pneumonia, unspecified organism: Secondary | ICD-10-CM | POA: Diagnosis not present

## 2016-06-10 DIAGNOSIS — J209 Acute bronchitis, unspecified: Secondary | ICD-10-CM | POA: Diagnosis not present

## 2016-06-10 DIAGNOSIS — I110 Hypertensive heart disease with heart failure: Secondary | ICD-10-CM | POA: Diagnosis not present

## 2016-06-10 DIAGNOSIS — J439 Emphysema, unspecified: Secondary | ICD-10-CM | POA: Diagnosis not present

## 2016-06-18 DIAGNOSIS — I10 Essential (primary) hypertension: Secondary | ICD-10-CM | POA: Diagnosis not present

## 2016-06-18 DIAGNOSIS — R791 Abnormal coagulation profile: Secondary | ICD-10-CM | POA: Diagnosis not present

## 2016-06-18 DIAGNOSIS — I4891 Unspecified atrial fibrillation: Secondary | ICD-10-CM | POA: Diagnosis not present

## 2016-06-18 DIAGNOSIS — E119 Type 2 diabetes mellitus without complications: Secondary | ICD-10-CM | POA: Diagnosis not present

## 2016-06-18 DIAGNOSIS — R040 Epistaxis: Secondary | ICD-10-CM | POA: Diagnosis not present

## 2016-06-18 DIAGNOSIS — Z79899 Other long term (current) drug therapy: Secondary | ICD-10-CM | POA: Diagnosis not present

## 2016-06-27 DIAGNOSIS — I110 Hypertensive heart disease with heart failure: Secondary | ICD-10-CM | POA: Diagnosis not present

## 2016-06-27 DIAGNOSIS — Z7901 Long term (current) use of anticoagulants: Secondary | ICD-10-CM | POA: Diagnosis not present

## 2016-07-03 DIAGNOSIS — I2510 Atherosclerotic heart disease of native coronary artery without angina pectoris: Secondary | ICD-10-CM | POA: Diagnosis not present

## 2016-07-03 DIAGNOSIS — I480 Paroxysmal atrial fibrillation: Secondary | ICD-10-CM | POA: Diagnosis not present

## 2016-07-03 DIAGNOSIS — I5043 Acute on chronic combined systolic (congestive) and diastolic (congestive) heart failure: Secondary | ICD-10-CM | POA: Diagnosis not present

## 2016-07-03 DIAGNOSIS — R0609 Other forms of dyspnea: Secondary | ICD-10-CM | POA: Diagnosis not present

## 2016-07-03 DIAGNOSIS — Z7901 Long term (current) use of anticoagulants: Secondary | ICD-10-CM | POA: Diagnosis not present

## 2016-07-03 DIAGNOSIS — I255 Ischemic cardiomyopathy: Secondary | ICD-10-CM | POA: Diagnosis not present

## 2016-07-14 ENCOUNTER — Other Ambulatory Visit: Payer: Medicare Other

## 2016-07-17 ENCOUNTER — Ambulatory Visit (HOSPITAL_COMMUNITY)
Admission: RE | Admit: 2016-07-17 | Discharge: 2016-07-17 | Disposition: A | Discharge status: Home / Self Care | Payer: PPO | Source: Ambulatory Visit | Attending: Internal Medicine | Admitting: Internal Medicine

## 2016-07-17 ENCOUNTER — Other Ambulatory Visit (HOSPITAL_BASED_OUTPATIENT_CLINIC_OR_DEPARTMENT_OTHER): Payer: PPO

## 2016-07-17 DIAGNOSIS — J439 Emphysema, unspecified: Secondary | ICD-10-CM | POA: Insufficient documentation

## 2016-07-17 DIAGNOSIS — R918 Other nonspecific abnormal finding of lung field: Secondary | ICD-10-CM | POA: Insufficient documentation

## 2016-07-17 DIAGNOSIS — C3492 Malignant neoplasm of unspecified part of left bronchus or lung: Secondary | ICD-10-CM | POA: Diagnosis not present

## 2016-07-17 DIAGNOSIS — J984 Other disorders of lung: Secondary | ICD-10-CM | POA: Diagnosis not present

## 2016-07-17 DIAGNOSIS — C3490 Malignant neoplasm of unspecified part of unspecified bronchus or lung: Secondary | ICD-10-CM | POA: Diagnosis not present

## 2016-07-17 DIAGNOSIS — C3412 Malignant neoplasm of upper lobe, left bronchus or lung: Secondary | ICD-10-CM | POA: Diagnosis not present

## 2016-07-17 DIAGNOSIS — K8020 Calculus of gallbladder without cholecystitis without obstruction: Secondary | ICD-10-CM | POA: Diagnosis not present

## 2016-07-17 LAB — CBC WITH DIFFERENTIAL/PLATELET
BASO%: 1 % (ref 0.0–2.0)
BASOS ABS: 0.1 10*3/uL (ref 0.0–0.1)
EOS ABS: 0.1 10*3/uL (ref 0.0–0.5)
EOS%: 1.3 % (ref 0.0–7.0)
HCT: 39.6 % (ref 38.4–49.9)
HEMOGLOBIN: 13 g/dL (ref 13.0–17.1)
LYMPH%: 17.9 % (ref 14.0–49.0)
MCH: 28.9 pg (ref 27.2–33.4)
MCHC: 32.9 g/dL (ref 32.0–36.0)
MCV: 87.9 fL (ref 79.3–98.0)
MONO#: 0.7 10*3/uL (ref 0.1–0.9)
MONO%: 9.1 % (ref 0.0–14.0)
NEUT#: 5.4 10*3/uL (ref 1.5–6.5)
NEUT%: 70.7 % (ref 39.0–75.0)
Platelets: 131 10*3/uL — ABNORMAL LOW (ref 140–400)
RBC: 4.5 10*6/uL (ref 4.20–5.82)
RDW: 17.2 % — AB (ref 11.0–14.6)
WBC: 7.7 10*3/uL (ref 4.0–10.3)
lymph#: 1.4 10*3/uL (ref 0.9–3.3)

## 2016-07-17 LAB — COMPREHENSIVE METABOLIC PANEL
ALBUMIN: 3.6 g/dL (ref 3.5–5.0)
ALT: 21 U/L (ref 0–55)
AST: 20 U/L (ref 5–34)
Alkaline Phosphatase: 96 U/L (ref 40–150)
Anion Gap: 9 mEq/L (ref 3–11)
BUN: 46.7 mg/dL — AB (ref 7.0–26.0)
CHLORIDE: 102 meq/L (ref 98–109)
CO2: 26 meq/L (ref 22–29)
Calcium: 9.8 mg/dL (ref 8.4–10.4)
Creatinine: 2.1 mg/dL — ABNORMAL HIGH (ref 0.7–1.3)
EGFR: 29 mL/min/{1.73_m2} — AB (ref >90)
GLUCOSE: 246 mg/dL — AB (ref 70–140)
POTASSIUM: 4.5 meq/L (ref 3.5–5.1)
SODIUM: 137 meq/L (ref 136–145)
Total Bilirubin: 0.3 mg/dL (ref 0.20–1.20)
Total Protein: 7.8 g/dL (ref 6.4–8.3)

## 2016-07-21 ENCOUNTER — Telehealth: Payer: Self-pay | Admitting: Oncology

## 2016-07-21 NOTE — Telephone Encounter (Addendum)
Brandon Clay called and said her father had received a call last week about his lab work.  She said he can't remember what was said but that it "rhymed with vertigo."  Advised her that the only abnormal on his lab work showed elevated kidney function that he has had for awhile.  Also said that this can be discussed with Dr. Sondra Come at his follow up appointment tomorrow.  Brandon Clay verbalized understanding and agreement.

## 2016-07-22 ENCOUNTER — Encounter: Payer: Self-pay | Admitting: Internal Medicine

## 2016-07-22 ENCOUNTER — Telehealth: Payer: Self-pay | Admitting: Internal Medicine

## 2016-07-22 ENCOUNTER — Ambulatory Visit (HOSPITAL_BASED_OUTPATIENT_CLINIC_OR_DEPARTMENT_OTHER): Payer: PPO | Admitting: Internal Medicine

## 2016-07-22 ENCOUNTER — Ambulatory Visit
Admission: RE | Admit: 2016-07-22 | Discharge: 2016-07-22 | Disposition: A | Discharge status: Home / Self Care | Payer: PPO | Source: Ambulatory Visit | Attending: Radiation Oncology | Admitting: Radiation Oncology

## 2016-07-22 VITALS — BP 143/79 | HR 77 | Temp 98.0°F | Resp 18 | Ht 70.0 in | Wt 173.1 lb

## 2016-07-22 DIAGNOSIS — J700 Acute pulmonary manifestations due to radiation: Secondary | ICD-10-CM

## 2016-07-22 DIAGNOSIS — R042 Hemoptysis: Secondary | ICD-10-CM | POA: Diagnosis not present

## 2016-07-22 DIAGNOSIS — C3412 Malignant neoplasm of upper lobe, left bronchus or lung: Secondary | ICD-10-CM | POA: Diagnosis not present

## 2016-07-22 DIAGNOSIS — K8020 Calculus of gallbladder without cholecystitis without obstruction: Secondary | ICD-10-CM | POA: Insufficient documentation

## 2016-07-22 DIAGNOSIS — Z794 Long term (current) use of insulin: Secondary | ICD-10-CM | POA: Insufficient documentation

## 2016-07-22 DIAGNOSIS — Z7901 Long term (current) use of anticoagulants: Secondary | ICD-10-CM | POA: Insufficient documentation

## 2016-07-22 DIAGNOSIS — C3492 Malignant neoplasm of unspecified part of left bronchus or lung: Secondary | ICD-10-CM

## 2016-07-22 DIAGNOSIS — Z7982 Long term (current) use of aspirin: Secondary | ICD-10-CM | POA: Diagnosis not present

## 2016-07-22 DIAGNOSIS — Z08 Encounter for follow-up examination after completed treatment for malignant neoplasm: Secondary | ICD-10-CM | POA: Diagnosis not present

## 2016-07-22 DIAGNOSIS — J439 Emphysema, unspecified: Secondary | ICD-10-CM

## 2016-07-22 DIAGNOSIS — Z8709 Personal history of other diseases of the respiratory system: Secondary | ICD-10-CM | POA: Diagnosis not present

## 2016-07-22 DIAGNOSIS — Z79899 Other long term (current) drug therapy: Secondary | ICD-10-CM | POA: Diagnosis not present

## 2016-07-22 DIAGNOSIS — Z85118 Personal history of other malignant neoplasm of bronchus and lung: Secondary | ICD-10-CM | POA: Diagnosis not present

## 2016-07-22 NOTE — Progress Notes (Signed)
Radiation Oncology         [phone] ________________________________  Name: Brandon Clay MRN: [id number]  Date: 07/22/2016  DOB: Apr 25, 1935  Follow-Up Visit Note  CC: Cyndi Bender, PA-C  Grace Isaac, MD    ICD-9-CM ICD-10-CM   1. Adenocarcinoma of left lung, stage 1 (HCC) 162.9 C34.92     Diagnosis:   Clinical stage IB adenocarcinoma of the left upper lung, prior setting of squamous cell carcinoma of the left upper lung  Interval Since Last Radiation: 7 months  01/29/16 - 02/08/16: Left upper lung: 60 Gy in 5 fractions.  08/23/2012 through 09/07/2012: Left upper chest region 60 Gy in 5 fractions (12 Gy per fraction)  Narrative:  The patient returns today for routine follow-up. Ct of the chest w/o contrast on 07/17/16 showed a stable to slightly smaller LUL mass, persistent but less significant adjacent pleural disease, stable right middle lobe and right lower lobe pulmonary nodules, and sever underlying emphysema and pulmonary scarring. The patient saw Dr. Julien Nordmann earlier today.  He denies pain or a cough. He denies having shortness of breath. He reports feeling "dizzy" in the mornings and said it wears off during the day. He was hospitalized in December with pneumonia and a UTI. He reports some hemoptysis his daughter attributes to starting Amiodarone while taking Warfarin. He is also wondering about the results from his latest blood work.  ALLERGIES:  is allergic to no known allergies.  Meds: Current Outpatient Prescriptions  Medication Sig Dispense Refill  . amiodarone (PACERONE) 200 MG tablet Take 200 mg by mouth daily.    Marland Kitchen aspirin EC 81 MG tablet Take 81 mg by mouth every other day.    . furosemide (LASIX) 20 MG tablet Take 40 mg by mouth daily.     Marland Kitchen LANTUS SOLOSTAR 100 UNIT/ML Solostar Pen Inject 15 Units into the skin 2 (two) times daily.    Marland Kitchen lisinopril (PRINIVIL,ZESTRIL) 10 MG tablet Take 10 mg by mouth daily.     . metoprolol succinate (TOPROL-XL) 25 MG 24  hr tablet Take 12.5 mg by mouth daily.    . Multiple Vitamin (MULTIVITAMIN WITH MINERALS) TABS Take 1 tablet by mouth daily. 30 tablet 0  . warfarin (COUMADIN) 5 MG tablet Take 7.5-10 mg by mouth See admin instructions. Mon and Fri 7.5 mg, 10 mg all other days    . metFORMIN (GLUCOPHAGE-XR) 500 MG 24 hr tablet Take 500 mg by mouth daily with breakfast.     . potassium chloride SA (K-DUR,KLOR-CON) 20 MEQ tablet Take 20 mEq by mouth daily.      No current facility-administered medications for this encounter.     Physical Findings: The patient is in no acute distress. Patient is alert and oriented. Vitals - 1 value per visit 09/20/2438  SYSTOLIC 102  DIASTOLIC 79  Pulse 77  Temperature 98  Respirations 18  Weight (lb) 173.1  Height '5\' 10"'$   BMI 24.84  VISIT REPORT    Ambulatory with a cane. Lungs are clear to auscultation bilaterally. Heart has regular rate and rhythm. No palpable cervical, supraclavicular, or axillary adenopathy.   Lab Findings: Lab Results  Component Value Date   WBC 7.7 07/17/2016   HGB 13.0 07/17/2016   HCT 39.6 07/17/2016   MCV 87.9 07/17/2016   PLT 131 (L) 07/17/2016    Radiographic Findings: Ct Chest Wo Contrast  Result Date: 07/17/2016 CLINICAL DATA:  Restaging lung cancer. EXAM: CT CHEST WITHOUT CONTRAST TECHNIQUE: Multidetector CT imaging of the chest  was performed following the standard protocol without IV contrast. COMPARISON:  Chest CT 11/01/2015 and PET-CT 11/22/2015 FINDINGS: Chest wall: No chest wall mass, supraclavicular or axillary lymphadenopathy. Small scattered lymph nodes are stable. Small thyroid nodules are stable. Cardiovascular: The heart is normal in size. No pericardial effusion. Stable areas of pericardial calcification. Stable mild tortuosity and moderate atherosclerotic calcifications involving the thoracic aorta. The branch vessels are patent but also demonstrate moderate atherosclerotic calcifications. There are it dense three-vessel  coronary artery calcifications. Mediastinum/Nodes: Stable mediastinal lymph nodes. 9 mm precarinal lymph node on image number 64 is unchanged. The 11 mm subcarinal lymph node previously measured 8 mm. Hilar nodes are difficult to measure without contrast. They appear grossly stable. Lungs/Pleura: Stable severe emphysema. Extensive scarring changes. The large irregular left upper lobe lung mass extending to the pleura with areas pleural thickening. This measures approximately 3.6 x 3.3 cm and previously measured 4.4 x 3.4 cm. Marked interstitial thickening and extending to the pleura. 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 Stable 6.5 mm right middle lobe pulmonary nodule on image number 90. Stable 9 mm right lower lobe pulmonary nodule on image number 112. No new pulmonary nodules. Upper Abdomen: Numerous large gallstones noted in the gallbladder. Advanced atherosclerotic calcification involving the abdominal aorta and branch vessels. No definite CT findings for hepatic or adrenal gland metastasis. Musculoskeletal: No significant bony findings to suggest metastasis. IMPRESSION: 1. Severe underlying emphysema and pulmonary scarring. 2. 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 A discrete persistent nodule is not identified. Persistent but less significant adjacent pleural disease. 3. Stable right middle lobe and right lower lobe pulmonary nodules. 4. No findings for upper abdominal metastatic disease. 5. Cholelithiasis. Electronically Signed   By: Marijo Sanes M.D.   On: 07/17/2016 15:17    Impression:  Clinical stage I adenocarcinoma of the left upper lung, prior setting of squamous cell carcinoma of the left upper lung.   He is doing well without any obvious side effects from his SBRT. Recent chest CT scan stable  Plan:  The patient has a CT scan scheduled on 10/20/16. He will follow up with Dr. Julien Nordmann  afterwards. He will follow up with me the same day he sees Dr. Julien Nordmann.  ____________________________________ -----------------------------------  Blair Promise, PhD, MD  This document serves as a record of services personally performed by Gery Pray, MD. It was created on his behalf by Darcus Austin, a trained medical scribe. The creation of this record is based on the scribe's personal observations and the provider's statements to them. This document has been checked and approved by the attending provider.

## 2016-07-22 NOTE — Progress Notes (Signed)
[place] [phone]   [phone]  OFFICE PROGRESS NOTE  Cyndi Bender, PA-C [place] [address]  DIAGNOSIS: Stage IA non-small cell lung cancer, squamous cell carcinoma of the left upper lobe.  Genomic Alterations Identified? CDK6 amplification FLT1 E72K TP53 V125f*26 Additional Findings?  Microsatellite status Cannot Be Determined Tumor Mutation Burden Cannot Be Determined Additional Disease-relevant Genes with No Reportable Alterations Identified? EGFR KRAS ALK BRAF MET RET ERBB2 ROS1    PRIOR THERAPY: Status post stereotactic body radiotherapy under the care of Dr. KSondra Comecompleted on 02/08/2016  CURRENT THERAPY: Observation.  INTERVAL HISTORY: Brandon Clay 82y.o. male returns to the clinic today for follow-up visit accompanied by his wife and daughter. The patient is feeling fine today with no specific complaints except for shortness breath with exertion. He completed a course of SBRT to the left upper lobe lung mass under the care of Dr. KSondra Comeand tolerated the treatment well. He denied having any chest pain, cough or hemoptysis. He denied having any weight loss or night sweats. He has no nausea, vomiting, diarrhea or constipation. He had repeat CT scan of the chest performed recently and he is here for evaluation and discussion of the scan results.  MEDICAL HISTORY: Past Medical History:  Diagnosis Date  . Atrial enlargement, left   . Benign prostatic hypertrophy   . Chronic kidney disease 01/07/2016   Chronic Kidney Disease   Stage I     GFR >90  Stage II    GFR 60-89  Stage IIIA GFR 45-59  Stage IIIB GFR 30-44  Stage IV   GFR 15-29  Stage V    GFR  <15  Lab Results Component Value Date  CREATININE 1.52 (H) 01/04/2016  Estimated Creatinine Clearance: 41.4 mL/min (by C-G formula based on SCr of 1.52 mg/dL).   . CKD (chronic kidney disease)   . Colitis   . COPD (chronic obstructive pulmonary  disease) ([facility]   . Coronary artery disease    Cath 07/02/12 3v CAD w/ chronic occlusion of RCA (fills from left collaterals), severe stenosis prox LCx & moderately severe dz in mid LAD; s/p PTCA/BMS to prox LCx 07/12/12   . Diabetes mellitus, type 2 ([facility] [place]   . Dysrhythmia    A Fib,takes Coumadin daily  . Emphysema   . History of radiation therapy 01/29/16-02/08/16   SBRT to left upper lung 60 Gy  . Hx of radiation therapy 08/23/12- 09/07/12   left upper chest region  . Hypercholesterolemia    statin intolerant  . Hypertension   . Lung cancer ([facility]    Followed by Dr. GServando Snare([facility] squamous cell carcinoma)  . Normocytic anemia   . PFO (patent foramen ovale)    by 07/29/12 TEE    ALLERGIES:  is allergic to no known allergies.  MEDICATIONS:  Current Outpatient Prescriptions  Medication Sig Dispense Refill  . amiodarone (PACERONE) 200 MG tablet Take 200 mg by mouth daily.    .Marland Kitchenaspirin EC 81 MG tablet Take 81 mg by mouth every other day.    . furosemide (LASIX) 20 MG tablet Take 20 mg by mouth daily.     .Marland KitchenLANTUS SOLOSTAR 100 UNIT/ML Solostar Pen Inject 15 Units into the skin 2 (two) times daily.    .Marland Kitchenlisinopril (PRINIVIL,ZESTRIL) 10 MG tablet Take 10 mg by mouth daily.     . metFORMIN (GLUCOPHAGE-XR) 500 MG 24 hr tablet Take 500 mg by mouth daily  with breakfast.     . metoprolol succinate (TOPROL-XL) 25 MG 24 hr tablet Take 12.5 mg by mouth daily.    . Multiple Vitamin (MULTIVITAMIN WITH MINERALS) TABS Take 1 tablet by mouth daily. 30 tablet 0  . potassium chloride SA (K-DUR,KLOR-CON) 20 MEQ tablet Take 20 mEq by mouth daily.     Marland Kitchen warfarin (COUMADIN) 5 MG tablet Take 7.5-10 mg by mouth See admin instructions. Mon and Fri 7.5 mg, 10 mg all other days     No current facility-administered medications for this visit.     SURGICAL HISTORY:  Past Surgical History:  Procedure Laterality Date  . CARDIAC CATHETERIZATION    . CARDIOVERSION N/A 07/29/2012   Procedure: CARDIOVERSION;  Surgeon:  Larey Dresser, MD;  Location: [facility];  Service: Cardiovascular;  Laterality: N/A;  . CORONARY ANGIOPLASTY WITH STENT PLACEMENT    . HEMORRHOID SURGERY    . LUNG BIOPSY    . LUNG BIOPSY N/A 01/07/2016   Procedure: TRANSBRONCHIAL BIOPSY;  Surgeon: Grace Isaac, MD;  Location: [facility];  Service: Thoracic;  Laterality: N/A;  . PERCUTANEOUS CORONARY STENT INTERVENTION (PCI-S) N/A 07/12/2012   Procedure: PERCUTANEOUS CORONARY STENT INTERVENTION (PCI-S);  Surgeon: Burnell Blanks, MD;  Location: [facility] CATH LAB;  Service: Cardiovascular;  Laterality: N/A;  . TEE WITHOUT CARDIOVERSION N/A 07/29/2012   Procedure: TRANSESOPHAGEAL ECHOCARDIOGRAM (TEE);  Surgeon: Larey Dresser, MD;  Location[facility] ENDOSCOPY;  Service: Cardiovascular;  Laterality: N/A;  patient coming from [facility] long room 1236 talk to jennifer at [place]  talk to [facility] from care link will pick up pat. at 8:15 from [place]  . VIDEO BRONCHOSCOPY WITH ENDOBRONCHIAL ULTRASOUND N/A 01/07/2016   Procedure: VIDEO BRONCHOSCOPY WITH ENDOBRONCHIAL ULTRASOUND;  Surgeon: Grace Isaac, MD;  Location: [facility] OR;  Service: Thoracic;  Laterality: N/A;    REVIEW OF SYSTEMS:  A comprehensive review of systems was negative except for: Constitutional: positive for fatigue Respiratory: positive for dyspnea on exertion   PHYSICAL EXAMINATION: General appearance: alert, cooperative, fatigued and no distress Head: Normocephalic, without obvious abnormality, atraumatic Neck: no adenopathy, no JVD, supple, symmetrical, trachea midline and thyroid not enlarged, symmetric, no tenderness/mass/nodules Lymph nodes: Cervical, supraclavicular, and axillary nodes normal. Resp: clear to auscultation bilaterally Back: symmetric, no curvature. ROM normal. No CVA tenderness. Cardio: regular rate and rhythm, S1, S2 normal, no murmur, click, rub or gallop GI: soft, non-tender; bowel sounds normal; no masses,  no organomegaly Extremities: extremities normal,  atraumatic, no cyanosis or edema  ECOG PERFORMANCE STATUS: 1 - Symptomatic but completely ambulatory  Blood pressure (!) 143/79, pulse 77, temperature 98 F (36.7 C), temperature source Oral, resp. rate 18, height _0  (1.778 m), weight 173 lb 1.6 oz (78.5 kg), SpO2 94 %.  LABORATORY DATA: Lab Results  Component Value Date   WBC 7.7 07/17/2016   HGB 13.0 07/17/2016   HCT 39.6 07/17/2016   MCV 87.9 07/17/2016   PLT 131 (L) 07/17/2016      Chemistry      Component Value Date/Time   NA 137 07/17/2016 1220   K 4.5 07/17/2016 1220   CL 112 (H) 01/04/2016 0850   CL 105 09/02/2012 1052   CO2 26 07/17/2016 1220   BUN 46.7 (H) 07/17/2016 1220   CREATININE 2.1 (H) 07/17/2016 1220      Component Value Date/Time   CALCIUM 9.8 07/17/2016 1220   ALKPHOS 96 07/17/2016 1220   AST 20 07/17/2016 1220   ALT 21 07/17/2016 1220   BILITOT  0.30 07/17/2016 1220       RADIOGRAPHIC STUDIES: Ct Chest Wo Contrast  Result Date: 07/17/2016 CLINICAL DATA:  Restaging lung cancer. EXAM: CT CHEST WITHOUT CONTRAST TECHNIQUE: Multidetector CT imaging of the chest was performed following the standard protocol without IV contrast. COMPARISON:  Chest CT 11/01/2015 and PET-CT 11/22/2015 FINDINGS: Chest wall: No chest wall mass, supraclavicular or axillary lymphadenopathy. Small scattered lymph nodes are stable. Small thyroid nodules are stable. Cardiovascular: The heart is normal in size. No pericardial effusion. Stable areas of pericardial calcification. Stable mild tortuosity and moderate atherosclerotic calcifications involving the thoracic aorta. The branch vessels are patent but also demonstrate moderate atherosclerotic calcifications. There are it dense three-vessel coronary artery calcifications. Mediastinum/Nodes: Stable mediastinal lymph nodes. 9 mm precarinal lymph node on image number 64 is unchanged. The 11 mm subcarinal lymph node previously measured 8 mm. Hilar nodes are difficult to measure without  contrast. They appear grossly stable. Lungs/Pleura: Stable severe emphysema. Extensive scarring changes. The large irregular left upper lobe lung mass extending to the pleura with areas pleural thickening. This measures approximately 3.6 x 3.3 cm and previously measured 4.4 x 3.4 cm. Marked interstitial thickening and extending to the pleura. 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 Stable 6.5 mm right middle lobe pulmonary nodule on image number 90. Stable 9 mm right lower lobe pulmonary nodule on image number 112. No new pulmonary nodules. Upper Abdomen: Numerous large gallstones noted in the gallbladder. Advanced atherosclerotic calcification involving the abdominal aorta and branch vessels. No definite CT findings for hepatic or adrenal gland metastasis. Musculoskeletal: No significant bony findings to suggest metastasis. IMPRESSION: 1. Severe underlying emphysema and pulmonary scarring. 2. 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 A discrete persistent nodule is not identified. Persistent but less significant adjacent pleural disease. 3. Stable right middle lobe and right lower lobe pulmonary nodules. 4. No findings for upper abdominal metastatic disease. 5. Cholelithiasis. Electronically Signed   By: Marijo Sanes M.D.   On: 07/17/2016 15:17    ASSESSMENT AND PLAN: This is a very pleasant 82 years old white male with unresectable a stage IB non-small cell lung cancer, squamous cell carcinoma presenting with a mass in the left upper lobe. The patient underwent stereotactic body radiotherapy to the mass lesion with some improvement and decrease in the size of this lesion. He continues to have significant emphysema as well as pulmonary scarring and stable right middle and right lower lobe pulmonary nodules. I discussed the scan results and showed the images to the patient and his family. I recommended  for him to continue on observation with repeat CT scan of the chest in 3 months for restaging of his disease. He was advised to call immediately if he has any concerning symptoms in the interval. The patient voices understanding of current disease status and treatment options and is in agreement with the current care plan.  All questions were answered. The patient knows to call the clinic with any problems, questions or concerns. We can certainly see the patient much sooner if necessary.  I spent 10 minutes counseling the patient face to face. The total time spent in the appointment was 15 minutes.  Disclaimer: This note was dictated with voice recognition software. Similar sounding words can inadvertently be transcribed and may not be corrected upon review.

## 2016-07-22 NOTE — Telephone Encounter (Signed)
Appointments scheduled per 2/13 LOS. Patient given AVS report and calendars with future scheduled appointments. °

## 2016-07-22 NOTE — Progress Notes (Addendum)
Brandon Clay is here for follow up.  He denies having pain or a cough.  He denies having shortness of breath.  He reports feeling "dizzy" in the mornings and said it wears off during the day.  He was hospitalized in December with pneumonia and a UTI.  He is also wondering about the results from his latest blood work.  Vital signs were taken in Dr. Worthy Flank office today: bp 143/79, hr 77, rr 18, temp 98.  He had a CT scan on 2/8 and was told he will need another one in 3 months.

## 2016-08-01 DIAGNOSIS — R791 Abnormal coagulation profile: Secondary | ICD-10-CM | POA: Diagnosis not present

## 2016-08-07 DIAGNOSIS — R791 Abnormal coagulation profile: Secondary | ICD-10-CM | POA: Diagnosis not present

## 2016-08-14 DIAGNOSIS — R791 Abnormal coagulation profile: Secondary | ICD-10-CM | POA: Diagnosis not present

## 2016-08-18 DIAGNOSIS — J449 Chronic obstructive pulmonary disease, unspecified: Secondary | ICD-10-CM | POA: Diagnosis not present

## 2016-08-21 DIAGNOSIS — I4891 Unspecified atrial fibrillation: Secondary | ICD-10-CM | POA: Diagnosis not present

## 2016-08-21 DIAGNOSIS — Z7901 Long term (current) use of anticoagulants: Secondary | ICD-10-CM | POA: Diagnosis not present

## 2016-08-21 DIAGNOSIS — I255 Ischemic cardiomyopathy: Secondary | ICD-10-CM | POA: Diagnosis not present

## 2016-08-21 DIAGNOSIS — R410 Disorientation, unspecified: Secondary | ICD-10-CM | POA: Diagnosis not present

## 2016-08-21 DIAGNOSIS — I2510 Atherosclerotic heart disease of native coronary artery without angina pectoris: Secondary | ICD-10-CM | POA: Diagnosis not present

## 2016-08-21 DIAGNOSIS — I480 Paroxysmal atrial fibrillation: Secondary | ICD-10-CM | POA: Diagnosis not present

## 2016-09-16 DIAGNOSIS — E119 Type 2 diabetes mellitus without complications: Secondary | ICD-10-CM | POA: Diagnosis not present

## 2016-09-16 DIAGNOSIS — I2510 Atherosclerotic heart disease of native coronary artery without angina pectoris: Secondary | ICD-10-CM | POA: Diagnosis not present

## 2016-09-16 DIAGNOSIS — Z7901 Long term (current) use of anticoagulants: Secondary | ICD-10-CM | POA: Diagnosis not present

## 2016-09-16 DIAGNOSIS — I10 Essential (primary) hypertension: Secondary | ICD-10-CM | POA: Diagnosis not present

## 2016-09-16 DIAGNOSIS — N183 Chronic kidney disease, stage 3 (moderate): Secondary | ICD-10-CM | POA: Diagnosis not present

## 2016-09-18 DIAGNOSIS — J449 Chronic obstructive pulmonary disease, unspecified: Secondary | ICD-10-CM | POA: Diagnosis not present

## 2016-10-17 DIAGNOSIS — Z7901 Long term (current) use of anticoagulants: Secondary | ICD-10-CM | POA: Diagnosis not present

## 2016-10-18 DIAGNOSIS — E860 Dehydration: Secondary | ICD-10-CM | POA: Diagnosis not present

## 2016-10-18 DIAGNOSIS — H6123 Impacted cerumen, bilateral: Secondary | ICD-10-CM | POA: Diagnosis not present

## 2016-10-18 DIAGNOSIS — J449 Chronic obstructive pulmonary disease, unspecified: Secondary | ICD-10-CM | POA: Diagnosis not present

## 2016-10-20 ENCOUNTER — Other Ambulatory Visit (HOSPITAL_BASED_OUTPATIENT_CLINIC_OR_DEPARTMENT_OTHER): Payer: PPO

## 2016-10-20 ENCOUNTER — Ambulatory Visit (HOSPITAL_COMMUNITY)
Admission: RE | Admit: 2016-10-20 | Discharge: 2016-10-20 | Disposition: A | Discharge status: Home / Self Care | Payer: PPO | Source: Ambulatory Visit | Attending: Internal Medicine | Admitting: Internal Medicine

## 2016-10-20 ENCOUNTER — Encounter (HOSPITAL_COMMUNITY): Payer: Self-pay

## 2016-10-20 DIAGNOSIS — E860 Dehydration: Secondary | ICD-10-CM | POA: Diagnosis not present

## 2016-10-20 DIAGNOSIS — R4182 Altered mental status, unspecified: Secondary | ICD-10-CM | POA: Diagnosis not present

## 2016-10-20 DIAGNOSIS — J189 Pneumonia, unspecified organism: Secondary | ICD-10-CM | POA: Insufficient documentation

## 2016-10-20 DIAGNOSIS — J700 Acute pulmonary manifestations due to radiation: Secondary | ICD-10-CM | POA: Diagnosis not present

## 2016-10-20 DIAGNOSIS — R0602 Shortness of breath: Secondary | ICD-10-CM | POA: Diagnosis not present

## 2016-10-20 DIAGNOSIS — J439 Emphysema, unspecified: Secondary | ICD-10-CM | POA: Diagnosis not present

## 2016-10-20 DIAGNOSIS — K8020 Calculus of gallbladder without cholecystitis without obstruction: Secondary | ICD-10-CM | POA: Diagnosis not present

## 2016-10-20 DIAGNOSIS — R531 Weakness: Secondary | ICD-10-CM | POA: Diagnosis not present

## 2016-10-20 DIAGNOSIS — C3492 Malignant neoplasm of unspecified part of left bronchus or lung: Secondary | ICD-10-CM | POA: Diagnosis not present

## 2016-10-20 DIAGNOSIS — R404 Transient alteration of awareness: Secondary | ICD-10-CM | POA: Diagnosis not present

## 2016-10-20 DIAGNOSIS — N289 Disorder of kidney and ureter, unspecified: Secondary | ICD-10-CM | POA: Diagnosis not present

## 2016-10-20 DIAGNOSIS — I700 Atherosclerosis of aorta: Secondary | ICD-10-CM | POA: Diagnosis not present

## 2016-10-20 DIAGNOSIS — C3412 Malignant neoplasm of upper lobe, left bronchus or lung: Secondary | ICD-10-CM | POA: Diagnosis not present

## 2016-10-20 LAB — COMPREHENSIVE METABOLIC PANEL
ALK PHOS: 79 U/L (ref 40–150)
ALT: 17 U/L (ref 0–55)
ANION GAP: 12 meq/L — AB (ref 3–11)
AST: 19 U/L (ref 5–34)
Albumin: 2.8 g/dL — ABNORMAL LOW (ref 3.5–5.0)
BUN: 53 mg/dL — ABNORMAL HIGH (ref 7.0–26.0)
CO2: 22 mEq/L (ref 22–29)
Calcium: 9.1 mg/dL (ref 8.4–10.4)
Chloride: 99 mEq/L (ref 98–109)
Creatinine: 2.4 mg/dL — ABNORMAL HIGH (ref 0.7–1.3)
EGFR: 24 mL/min/{1.73_m2} — ABNORMAL LOW (ref >90)
Glucose: 335 mg/dl — ABNORMAL HIGH (ref 70–140)
POTASSIUM: 4.2 meq/L (ref 3.5–5.1)
Sodium: 133 mEq/L — ABNORMAL LOW (ref 136–145)
TOTAL PROTEIN: 7.2 g/dL (ref 6.4–8.3)
Total Bilirubin: 0.66 mg/dL (ref 0.20–1.20)

## 2016-10-20 LAB — CBC WITH DIFFERENTIAL/PLATELET
BASO%: 0.2 % (ref 0.0–2.0)
Basophils Absolute: 0 10*3/uL (ref 0.0–0.1)
EOS ABS: 0.1 10*3/uL (ref 0.0–0.5)
EOS%: 0.6 % (ref 0.0–7.0)
HCT: 35 % — ABNORMAL LOW (ref 38.4–49.9)
HGB: 11.6 g/dL — ABNORMAL LOW (ref 13.0–17.1)
LYMPH%: 5.4 % — ABNORMAL LOW (ref 14.0–49.0)
MCH: 27.8 pg (ref 27.2–33.4)
MCHC: 33.1 g/dL (ref 32.0–36.0)
MCV: 83.9 fL (ref 79.3–98.0)
MONO#: 1.5 10*3/uL — ABNORMAL HIGH (ref 0.1–0.9)
MONO%: 12.1 % (ref 0.0–14.0)
NEUT%: 81.7 % — ABNORMAL HIGH (ref 39.0–75.0)
NEUTROS ABS: 10 10*3/uL — AB (ref 1.5–6.5)
PLATELETS: 142 10*3/uL (ref 140–400)
RBC: 4.17 10*6/uL — AB (ref 4.20–5.82)
RDW: 16.2 % — ABNORMAL HIGH (ref 11.0–14.6)
WBC: 12.2 10*3/uL — AB (ref 4.0–10.3)
lymph#: 0.7 10*3/uL — ABNORMAL LOW (ref 0.9–3.3)
nRBC: 0 % (ref 0–0)

## 2016-10-21 ENCOUNTER — Telehealth: Payer: Self-pay | Admitting: Internal Medicine

## 2016-10-21 DIAGNOSIS — N189 Chronic kidney disease, unspecified: Secondary | ICD-10-CM | POA: Diagnosis not present

## 2016-10-21 DIAGNOSIS — I110 Hypertensive heart disease with heart failure: Secondary | ICD-10-CM | POA: Diagnosis not present

## 2016-10-21 DIAGNOSIS — I130 Hypertensive heart and chronic kidney disease with heart failure and stage 1 through stage 4 chronic kidney disease, or unspecified chronic kidney disease: Secondary | ICD-10-CM | POA: Diagnosis not present

## 2016-10-21 DIAGNOSIS — M1990 Unspecified osteoarthritis, unspecified site: Secondary | ICD-10-CM | POA: Diagnosis not present

## 2016-10-21 DIAGNOSIS — Z794 Long term (current) use of insulin: Secondary | ICD-10-CM | POA: Diagnosis not present

## 2016-10-21 DIAGNOSIS — Z7901 Long term (current) use of anticoagulants: Secondary | ICD-10-CM | POA: Diagnosis not present

## 2016-10-21 DIAGNOSIS — R4182 Altered mental status, unspecified: Secondary | ICD-10-CM | POA: Diagnosis not present

## 2016-10-21 DIAGNOSIS — F17211 Nicotine dependence, cigarettes, in remission: Secondary | ICD-10-CM | POA: Diagnosis not present

## 2016-10-21 DIAGNOSIS — J9610 Chronic respiratory failure, unspecified whether with hypoxia or hypercapnia: Secondary | ICD-10-CM | POA: Diagnosis not present

## 2016-10-21 DIAGNOSIS — I480 Paroxysmal atrial fibrillation: Secondary | ICD-10-CM | POA: Diagnosis not present

## 2016-10-21 DIAGNOSIS — C3490 Malignant neoplasm of unspecified part of unspecified bronchus or lung: Secondary | ICD-10-CM | POA: Diagnosis not present

## 2016-10-21 DIAGNOSIS — J9621 Acute and chronic respiratory failure with hypoxia: Secondary | ICD-10-CM | POA: Diagnosis not present

## 2016-10-21 DIAGNOSIS — E119 Type 2 diabetes mellitus without complications: Secondary | ICD-10-CM | POA: Diagnosis not present

## 2016-10-21 DIAGNOSIS — Z7982 Long term (current) use of aspirin: Secondary | ICD-10-CM | POA: Diagnosis not present

## 2016-10-21 DIAGNOSIS — Z8701 Personal history of pneumonia (recurrent): Secondary | ICD-10-CM | POA: Diagnosis not present

## 2016-10-21 DIAGNOSIS — J189 Pneumonia, unspecified organism: Secondary | ICD-10-CM | POA: Diagnosis not present

## 2016-10-21 DIAGNOSIS — I5022 Chronic systolic (congestive) heart failure: Secondary | ICD-10-CM

## 2016-10-21 DIAGNOSIS — I255 Ischemic cardiomyopathy: Secondary | ICD-10-CM | POA: Diagnosis not present

## 2016-10-21 DIAGNOSIS — I214 Non-ST elevation (NSTEMI) myocardial infarction: Secondary | ICD-10-CM | POA: Diagnosis not present

## 2016-10-21 DIAGNOSIS — I252 Old myocardial infarction: Secondary | ICD-10-CM | POA: Diagnosis not present

## 2016-10-21 DIAGNOSIS — J440 Chronic obstructive pulmonary disease with acute lower respiratory infection: Secondary | ICD-10-CM | POA: Diagnosis not present

## 2016-10-21 DIAGNOSIS — I2510 Atherosclerotic heart disease of native coronary artery without angina pectoris: Secondary | ICD-10-CM | POA: Diagnosis not present

## 2016-10-21 DIAGNOSIS — R0602 Shortness of breath: Secondary | ICD-10-CM | POA: Diagnosis not present

## 2016-10-21 DIAGNOSIS — N289 Disorder of kidney and ureter, unspecified: Secondary | ICD-10-CM | POA: Diagnosis not present

## 2016-10-21 DIAGNOSIS — J181 Lobar pneumonia, unspecified organism: Secondary | ICD-10-CM | POA: Diagnosis not present

## 2016-10-21 DIAGNOSIS — Z87440 Personal history of urinary (tract) infections: Secondary | ICD-10-CM | POA: Diagnosis not present

## 2016-10-21 DIAGNOSIS — R748 Abnormal levels of other serum enzymes: Secondary | ICD-10-CM | POA: Diagnosis not present

## 2016-10-21 DIAGNOSIS — E860 Dehydration: Secondary | ICD-10-CM | POA: Diagnosis not present

## 2016-10-21 DIAGNOSIS — I4510 Unspecified right bundle-branch block: Secondary | ICD-10-CM | POA: Diagnosis not present

## 2016-10-21 DIAGNOSIS — E1122 Type 2 diabetes mellitus with diabetic chronic kidney disease: Secondary | ICD-10-CM | POA: Diagnosis not present

## 2016-10-21 DIAGNOSIS — Z79899 Other long term (current) drug therapy: Secondary | ICD-10-CM | POA: Diagnosis not present

## 2016-10-21 DIAGNOSIS — J441 Chronic obstructive pulmonary disease with (acute) exacerbation: Secondary | ICD-10-CM | POA: Diagnosis not present

## 2016-10-21 DIAGNOSIS — N390 Urinary tract infection, site not specified: Secondary | ICD-10-CM | POA: Diagnosis not present

## 2016-10-21 DIAGNOSIS — E7800 Pure hypercholesterolemia, unspecified: Secondary | ICD-10-CM | POA: Diagnosis not present

## 2016-10-21 DIAGNOSIS — N184 Chronic kidney disease, stage 4 (severe): Secondary | ICD-10-CM | POA: Diagnosis not present

## 2016-10-21 DIAGNOSIS — R531 Weakness: Secondary | ICD-10-CM | POA: Diagnosis not present

## 2016-10-21 DIAGNOSIS — N3001 Acute cystitis with hematuria: Secondary | ICD-10-CM | POA: Diagnosis not present

## 2016-10-21 DIAGNOSIS — Z87891 Personal history of nicotine dependence: Secondary | ICD-10-CM | POA: Diagnosis not present

## 2016-10-21 DIAGNOSIS — I4891 Unspecified atrial fibrillation: Secondary | ICD-10-CM | POA: Diagnosis not present

## 2016-10-21 NOTE — Telephone Encounter (Signed)
dtr called to cl 5/16 appt due to pt in hospital. Will call back to r/s

## 2016-10-22 ENCOUNTER — Ambulatory Visit: Payer: PPO | Admitting: Internal Medicine

## 2016-10-22 DIAGNOSIS — E119 Type 2 diabetes mellitus without complications: Secondary | ICD-10-CM | POA: Diagnosis not present

## 2016-10-22 DIAGNOSIS — I5022 Chronic systolic (congestive) heart failure: Secondary | ICD-10-CM | POA: Diagnosis not present

## 2016-10-22 DIAGNOSIS — R0602 Shortness of breath: Secondary | ICD-10-CM | POA: Diagnosis not present

## 2016-10-22 DIAGNOSIS — Z7901 Long term (current) use of anticoagulants: Secondary | ICD-10-CM | POA: Diagnosis not present

## 2016-10-22 DIAGNOSIS — J9610 Chronic respiratory failure, unspecified whether with hypoxia or hypercapnia: Secondary | ICD-10-CM | POA: Diagnosis not present

## 2016-10-22 DIAGNOSIS — R531 Weakness: Secondary | ICD-10-CM | POA: Diagnosis not present

## 2016-10-22 DIAGNOSIS — N289 Disorder of kidney and ureter, unspecified: Secondary | ICD-10-CM | POA: Diagnosis not present

## 2016-10-22 DIAGNOSIS — I4891 Unspecified atrial fibrillation: Secondary | ICD-10-CM | POA: Diagnosis not present

## 2016-10-22 DIAGNOSIS — R748 Abnormal levels of other serum enzymes: Secondary | ICD-10-CM | POA: Diagnosis not present

## 2016-10-22 DIAGNOSIS — J189 Pneumonia, unspecified organism: Secondary | ICD-10-CM | POA: Diagnosis not present

## 2016-10-22 DIAGNOSIS — N390 Urinary tract infection, site not specified: Secondary | ICD-10-CM | POA: Diagnosis not present

## 2016-10-23 DIAGNOSIS — R748 Abnormal levels of other serum enzymes: Secondary | ICD-10-CM | POA: Diagnosis not present

## 2016-10-23 DIAGNOSIS — R531 Weakness: Secondary | ICD-10-CM | POA: Diagnosis not present

## 2016-10-23 DIAGNOSIS — I4891 Unspecified atrial fibrillation: Secondary | ICD-10-CM | POA: Diagnosis not present

## 2016-10-23 DIAGNOSIS — N289 Disorder of kidney and ureter, unspecified: Secondary | ICD-10-CM | POA: Diagnosis not present

## 2016-10-23 DIAGNOSIS — N390 Urinary tract infection, site not specified: Secondary | ICD-10-CM | POA: Diagnosis not present

## 2016-10-23 DIAGNOSIS — J9610 Chronic respiratory failure, unspecified whether with hypoxia or hypercapnia: Secondary | ICD-10-CM | POA: Diagnosis not present

## 2016-10-23 DIAGNOSIS — I5022 Chronic systolic (congestive) heart failure: Secondary | ICD-10-CM | POA: Diagnosis not present

## 2016-10-23 DIAGNOSIS — Z7901 Long term (current) use of anticoagulants: Secondary | ICD-10-CM | POA: Diagnosis not present

## 2016-10-23 DIAGNOSIS — J189 Pneumonia, unspecified organism: Secondary | ICD-10-CM | POA: Diagnosis not present

## 2016-10-23 DIAGNOSIS — R0602 Shortness of breath: Secondary | ICD-10-CM | POA: Diagnosis not present

## 2016-10-23 DIAGNOSIS — E119 Type 2 diabetes mellitus without complications: Secondary | ICD-10-CM | POA: Diagnosis not present

## 2016-10-24 DIAGNOSIS — R748 Abnormal levels of other serum enzymes: Secondary | ICD-10-CM | POA: Diagnosis not present

## 2016-10-24 DIAGNOSIS — I480 Paroxysmal atrial fibrillation: Secondary | ICD-10-CM | POA: Diagnosis not present

## 2016-10-24 DIAGNOSIS — Z9989 Dependence on other enabling machines and devices: Secondary | ICD-10-CM | POA: Diagnosis not present

## 2016-10-24 DIAGNOSIS — R531 Weakness: Secondary | ICD-10-CM | POA: Diagnosis not present

## 2016-10-24 DIAGNOSIS — J849 Interstitial pulmonary disease, unspecified: Secondary | ICD-10-CM | POA: Diagnosis not present

## 2016-10-24 DIAGNOSIS — J9620 Acute and chronic respiratory failure, unspecified whether with hypoxia or hypercapnia: Secondary | ICD-10-CM | POA: Diagnosis not present

## 2016-10-24 DIAGNOSIS — I361 Nonrheumatic tricuspid (valve) insufficiency: Secondary | ICD-10-CM | POA: Diagnosis not present

## 2016-10-24 DIAGNOSIS — N390 Urinary tract infection, site not specified: Secondary | ICD-10-CM | POA: Diagnosis not present

## 2016-10-24 DIAGNOSIS — I517 Cardiomegaly: Secondary | ICD-10-CM | POA: Diagnosis not present

## 2016-10-24 DIAGNOSIS — J189 Pneumonia, unspecified organism: Secondary | ICD-10-CM | POA: Diagnosis not present

## 2016-10-24 DIAGNOSIS — R0602 Shortness of breath: Secondary | ICD-10-CM | POA: Diagnosis not present

## 2016-10-24 DIAGNOSIS — C3490 Malignant neoplasm of unspecified part of unspecified bronchus or lung: Secondary | ICD-10-CM | POA: Diagnosis not present

## 2016-10-24 DIAGNOSIS — N289 Disorder of kidney and ureter, unspecified: Secondary | ICD-10-CM | POA: Diagnosis not present

## 2016-10-24 DIAGNOSIS — I5022 Chronic systolic (congestive) heart failure: Secondary | ICD-10-CM | POA: Diagnosis not present

## 2016-10-24 DIAGNOSIS — J9610 Chronic respiratory failure, unspecified whether with hypoxia or hypercapnia: Secondary | ICD-10-CM | POA: Diagnosis not present

## 2016-10-24 DIAGNOSIS — I4891 Unspecified atrial fibrillation: Secondary | ICD-10-CM | POA: Diagnosis not present

## 2016-10-24 DIAGNOSIS — Z7901 Long term (current) use of anticoagulants: Secondary | ICD-10-CM | POA: Diagnosis not present

## 2016-10-24 DIAGNOSIS — R0603 Acute respiratory distress: Secondary | ICD-10-CM | POA: Diagnosis not present

## 2016-10-24 DIAGNOSIS — J441 Chronic obstructive pulmonary disease with (acute) exacerbation: Secondary | ICD-10-CM | POA: Diagnosis not present

## 2016-10-24 DIAGNOSIS — I255 Ischemic cardiomyopathy: Secondary | ICD-10-CM | POA: Diagnosis not present

## 2016-10-24 DIAGNOSIS — R911 Solitary pulmonary nodule: Secondary | ICD-10-CM | POA: Diagnosis not present

## 2016-10-24 DIAGNOSIS — I350 Nonrheumatic aortic (valve) stenosis: Secondary | ICD-10-CM | POA: Diagnosis not present

## 2016-10-24 DIAGNOSIS — I340 Nonrheumatic mitral (valve) insufficiency: Secondary | ICD-10-CM | POA: Diagnosis not present

## 2016-10-24 DIAGNOSIS — J209 Acute bronchitis, unspecified: Secondary | ICD-10-CM | POA: Diagnosis not present

## 2016-10-24 DIAGNOSIS — E119 Type 2 diabetes mellitus without complications: Secondary | ICD-10-CM | POA: Diagnosis not present

## 2016-10-25 DIAGNOSIS — E119 Type 2 diabetes mellitus without complications: Secondary | ICD-10-CM | POA: Diagnosis not present

## 2016-10-25 DIAGNOSIS — Z9989 Dependence on other enabling machines and devices: Secondary | ICD-10-CM | POA: Diagnosis not present

## 2016-10-25 DIAGNOSIS — C3490 Malignant neoplasm of unspecified part of unspecified bronchus or lung: Secondary | ICD-10-CM | POA: Diagnosis not present

## 2016-10-25 DIAGNOSIS — R531 Weakness: Secondary | ICD-10-CM | POA: Diagnosis not present

## 2016-10-25 DIAGNOSIS — Z7901 Long term (current) use of anticoagulants: Secondary | ICD-10-CM | POA: Diagnosis not present

## 2016-10-25 DIAGNOSIS — J9610 Chronic respiratory failure, unspecified whether with hypoxia or hypercapnia: Secondary | ICD-10-CM | POA: Diagnosis not present

## 2016-10-25 DIAGNOSIS — J209 Acute bronchitis, unspecified: Secondary | ICD-10-CM | POA: Diagnosis not present

## 2016-10-25 DIAGNOSIS — I5022 Chronic systolic (congestive) heart failure: Secondary | ICD-10-CM | POA: Diagnosis not present

## 2016-10-25 DIAGNOSIS — N289 Disorder of kidney and ureter, unspecified: Secondary | ICD-10-CM | POA: Diagnosis not present

## 2016-10-25 DIAGNOSIS — N390 Urinary tract infection, site not specified: Secondary | ICD-10-CM | POA: Diagnosis not present

## 2016-10-25 DIAGNOSIS — J9620 Acute and chronic respiratory failure, unspecified whether with hypoxia or hypercapnia: Secondary | ICD-10-CM | POA: Diagnosis not present

## 2016-10-25 DIAGNOSIS — I4891 Unspecified atrial fibrillation: Secondary | ICD-10-CM | POA: Diagnosis not present

## 2016-10-25 DIAGNOSIS — J189 Pneumonia, unspecified organism: Secondary | ICD-10-CM | POA: Diagnosis not present

## 2016-10-25 DIAGNOSIS — R748 Abnormal levels of other serum enzymes: Secondary | ICD-10-CM | POA: Diagnosis not present

## 2016-10-25 DIAGNOSIS — J849 Interstitial pulmonary disease, unspecified: Secondary | ICD-10-CM | POA: Diagnosis not present

## 2016-10-26 DIAGNOSIS — C3490 Malignant neoplasm of unspecified part of unspecified bronchus or lung: Secondary | ICD-10-CM | POA: Diagnosis not present

## 2016-10-26 DIAGNOSIS — R531 Weakness: Secondary | ICD-10-CM | POA: Diagnosis not present

## 2016-10-26 DIAGNOSIS — J189 Pneumonia, unspecified organism: Secondary | ICD-10-CM | POA: Diagnosis not present

## 2016-10-26 DIAGNOSIS — E119 Type 2 diabetes mellitus without complications: Secondary | ICD-10-CM | POA: Diagnosis not present

## 2016-10-26 DIAGNOSIS — R0603 Acute respiratory distress: Secondary | ICD-10-CM | POA: Diagnosis not present

## 2016-10-26 DIAGNOSIS — J209 Acute bronchitis, unspecified: Secondary | ICD-10-CM | POA: Diagnosis not present

## 2016-10-26 DIAGNOSIS — N390 Urinary tract infection, site not specified: Secondary | ICD-10-CM | POA: Diagnosis not present

## 2016-10-26 DIAGNOSIS — Z7901 Long term (current) use of anticoagulants: Secondary | ICD-10-CM | POA: Diagnosis not present

## 2016-10-26 DIAGNOSIS — N289 Disorder of kidney and ureter, unspecified: Secondary | ICD-10-CM | POA: Diagnosis not present

## 2016-10-26 DIAGNOSIS — R748 Abnormal levels of other serum enzymes: Secondary | ICD-10-CM | POA: Diagnosis not present

## 2016-10-26 DIAGNOSIS — Z9989 Dependence on other enabling machines and devices: Secondary | ICD-10-CM | POA: Diagnosis not present

## 2016-10-26 DIAGNOSIS — J9620 Acute and chronic respiratory failure, unspecified whether with hypoxia or hypercapnia: Secondary | ICD-10-CM | POA: Diagnosis not present

## 2016-10-26 DIAGNOSIS — I4891 Unspecified atrial fibrillation: Secondary | ICD-10-CM | POA: Diagnosis not present

## 2016-10-26 DIAGNOSIS — J9610 Chronic respiratory failure, unspecified whether with hypoxia or hypercapnia: Secondary | ICD-10-CM | POA: Diagnosis not present

## 2016-10-26 DIAGNOSIS — I5022 Chronic systolic (congestive) heart failure: Secondary | ICD-10-CM | POA: Diagnosis not present

## 2016-10-26 DIAGNOSIS — J849 Interstitial pulmonary disease, unspecified: Secondary | ICD-10-CM | POA: Diagnosis not present

## 2016-10-27 DIAGNOSIS — N289 Disorder of kidney and ureter, unspecified: Secondary | ICD-10-CM | POA: Diagnosis not present

## 2016-10-27 DIAGNOSIS — J9600 Acute respiratory failure, unspecified whether with hypoxia or hypercapnia: Secondary | ICD-10-CM | POA: Diagnosis not present

## 2016-10-27 DIAGNOSIS — Z9989 Dependence on other enabling machines and devices: Secondary | ICD-10-CM | POA: Diagnosis not present

## 2016-10-27 DIAGNOSIS — I4891 Unspecified atrial fibrillation: Secondary | ICD-10-CM | POA: Diagnosis not present

## 2016-10-27 DIAGNOSIS — J849 Interstitial pulmonary disease, unspecified: Secondary | ICD-10-CM | POA: Diagnosis not present

## 2016-10-27 DIAGNOSIS — J9610 Chronic respiratory failure, unspecified whether with hypoxia or hypercapnia: Secondary | ICD-10-CM | POA: Diagnosis not present

## 2016-10-27 DIAGNOSIS — I5022 Chronic systolic (congestive) heart failure: Secondary | ICD-10-CM | POA: Diagnosis not present

## 2016-10-27 DIAGNOSIS — J209 Acute bronchitis, unspecified: Secondary | ICD-10-CM | POA: Diagnosis not present

## 2016-10-27 DIAGNOSIS — R748 Abnormal levels of other serum enzymes: Secondary | ICD-10-CM | POA: Diagnosis not present

## 2016-10-27 DIAGNOSIS — R531 Weakness: Secondary | ICD-10-CM | POA: Diagnosis not present

## 2016-10-27 DIAGNOSIS — J189 Pneumonia, unspecified organism: Secondary | ICD-10-CM | POA: Diagnosis not present

## 2016-10-27 DIAGNOSIS — N390 Urinary tract infection, site not specified: Secondary | ICD-10-CM | POA: Diagnosis not present

## 2016-10-27 DIAGNOSIS — J9620 Acute and chronic respiratory failure, unspecified whether with hypoxia or hypercapnia: Secondary | ICD-10-CM | POA: Diagnosis not present

## 2016-10-27 DIAGNOSIS — E119 Type 2 diabetes mellitus without complications: Secondary | ICD-10-CM | POA: Diagnosis not present

## 2016-10-27 DIAGNOSIS — C3490 Malignant neoplasm of unspecified part of unspecified bronchus or lung: Secondary | ICD-10-CM | POA: Diagnosis not present

## 2016-10-27 DIAGNOSIS — Z7901 Long term (current) use of anticoagulants: Secondary | ICD-10-CM | POA: Diagnosis not present

## 2016-10-28 DIAGNOSIS — R748 Abnormal levels of other serum enzymes: Secondary | ICD-10-CM | POA: Diagnosis not present

## 2016-10-28 DIAGNOSIS — J9610 Chronic respiratory failure, unspecified whether with hypoxia or hypercapnia: Secondary | ICD-10-CM | POA: Diagnosis not present

## 2016-10-28 DIAGNOSIS — E119 Type 2 diabetes mellitus without complications: Secondary | ICD-10-CM | POA: Diagnosis not present

## 2016-10-28 DIAGNOSIS — Z9989 Dependence on other enabling machines and devices: Secondary | ICD-10-CM | POA: Diagnosis not present

## 2016-10-28 DIAGNOSIS — N289 Disorder of kidney and ureter, unspecified: Secondary | ICD-10-CM | POA: Diagnosis not present

## 2016-10-28 DIAGNOSIS — C3490 Malignant neoplasm of unspecified part of unspecified bronchus or lung: Secondary | ICD-10-CM | POA: Diagnosis not present

## 2016-10-28 DIAGNOSIS — J9620 Acute and chronic respiratory failure, unspecified whether with hypoxia or hypercapnia: Secondary | ICD-10-CM | POA: Diagnosis not present

## 2016-10-28 DIAGNOSIS — I4891 Unspecified atrial fibrillation: Secondary | ICD-10-CM | POA: Diagnosis not present

## 2016-10-28 DIAGNOSIS — J849 Interstitial pulmonary disease, unspecified: Secondary | ICD-10-CM | POA: Diagnosis not present

## 2016-10-28 DIAGNOSIS — R531 Weakness: Secondary | ICD-10-CM | POA: Diagnosis not present

## 2016-10-28 DIAGNOSIS — J209 Acute bronchitis, unspecified: Secondary | ICD-10-CM | POA: Diagnosis not present

## 2016-10-28 DIAGNOSIS — I5022 Chronic systolic (congestive) heart failure: Secondary | ICD-10-CM | POA: Diagnosis not present

## 2016-10-28 DIAGNOSIS — J189 Pneumonia, unspecified organism: Secondary | ICD-10-CM | POA: Diagnosis not present

## 2016-10-28 DIAGNOSIS — Z7901 Long term (current) use of anticoagulants: Secondary | ICD-10-CM | POA: Diagnosis not present

## 2016-10-28 DIAGNOSIS — J9690 Respiratory failure, unspecified, unspecified whether with hypoxia or hypercapnia: Secondary | ICD-10-CM | POA: Diagnosis not present

## 2016-10-28 DIAGNOSIS — N390 Urinary tract infection, site not specified: Secondary | ICD-10-CM | POA: Diagnosis not present

## 2016-10-29 ENCOUNTER — Telehealth: Payer: Self-pay

## 2016-10-29 NOTE — Telephone Encounter (Signed)
Misty at [facility] called to inform Dr Julien Nordmann that pt passed away 11-08-2016 at 418 pm. She can be reached at [phone] with any questions.

## 2016-11-07 DEATH — deceased

## 2018-02-27 IMAGING — CT CT BIOPSY
1 series · 14 of 32 positions shown, 18 images · non-contrast
Comparison: none

INDICATION: 80-year-old with history of left lung cancer and status post
radiation treatment. Recent PET CT raised concern for recurrence in
the left upper lobe and along the left chest wall. Request for
biopsy of both areas.

[Series 3: i-sequence 4.8 b40s · axial · 0.91mm/px · z∈[-146,-120]mm · 14 of 60 slices shown, 18 images]
[im 4/60  soft-tissue]
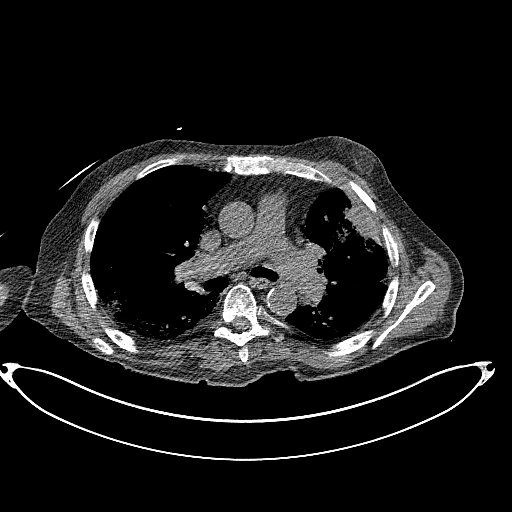
[im 4/60  bone]
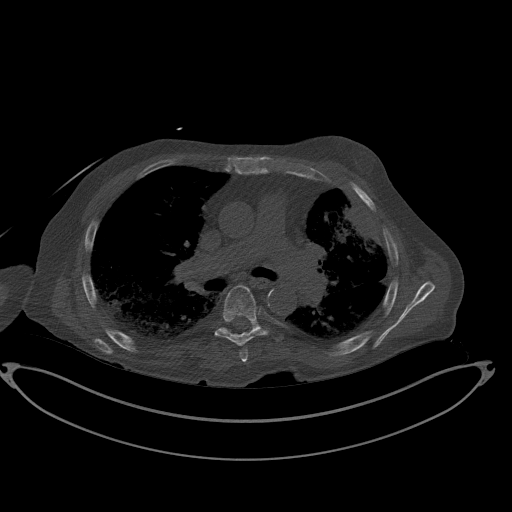
[im 8/60  soft-tissue]
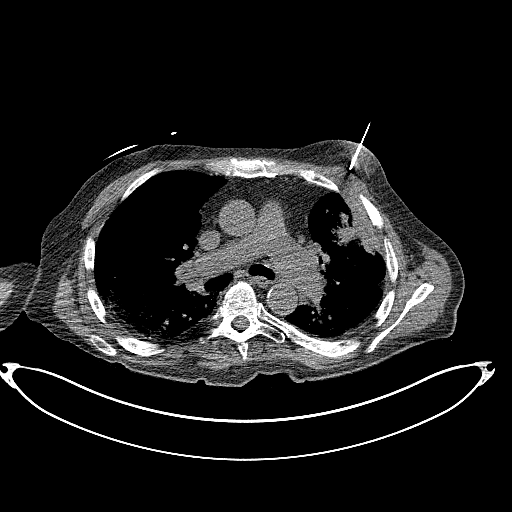
[im 14/60  soft-tissue]
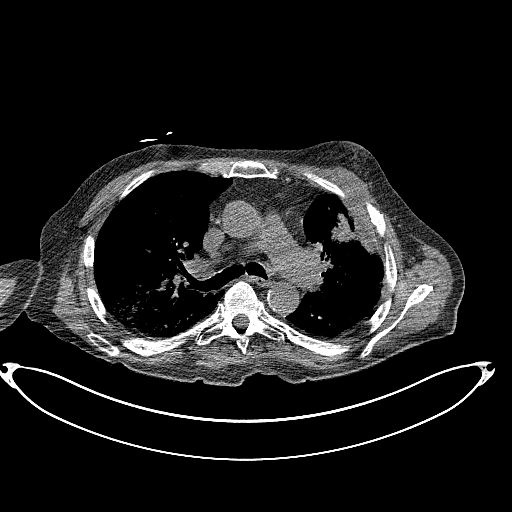
[im 18/60  soft-tissue]
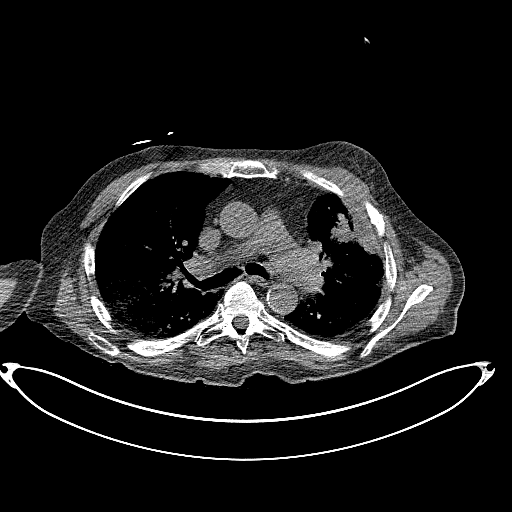
[im 23/60  soft-tissue]
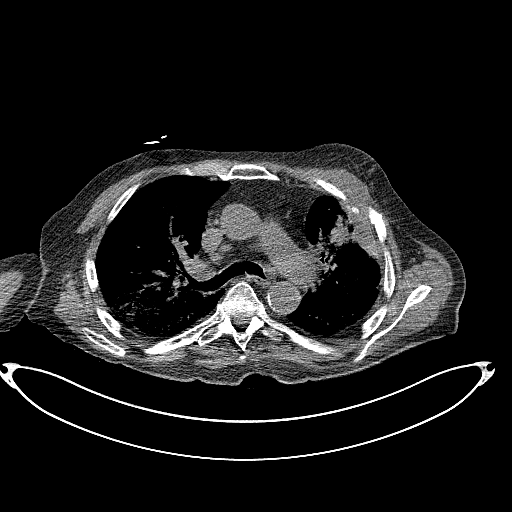
[im 27/60  soft-tissue]
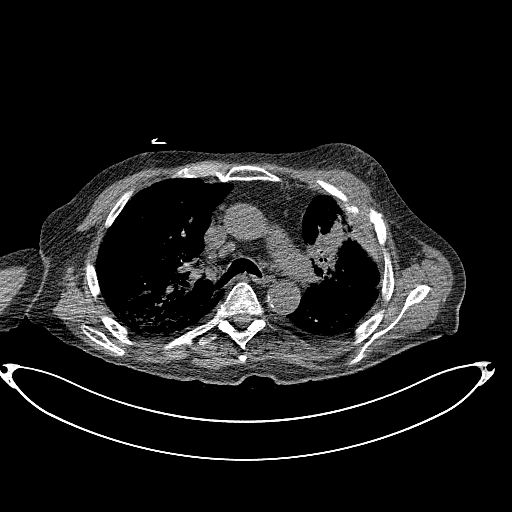
[im 33/60  soft-tissue]
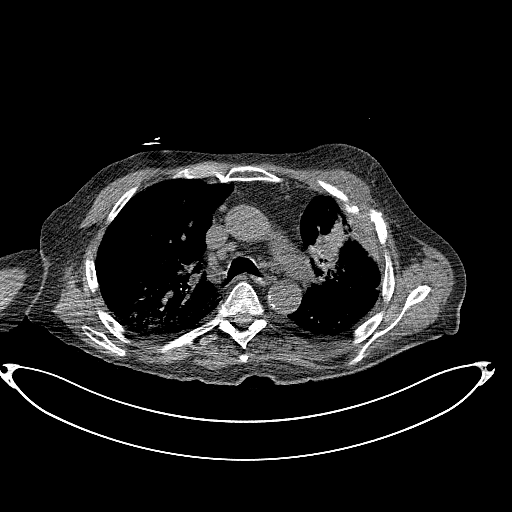
[im 37/60  soft-tissue]
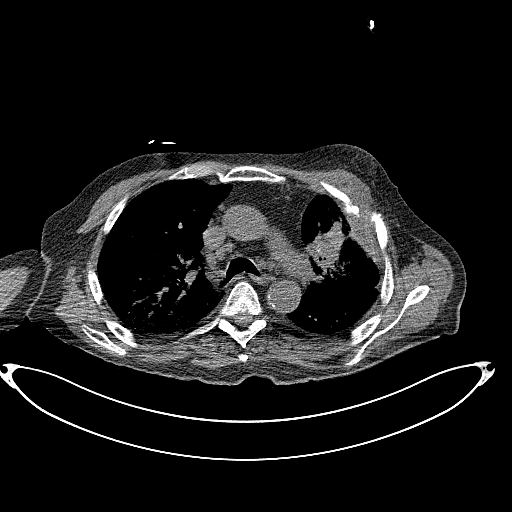
[im 42/60  soft-tissue]
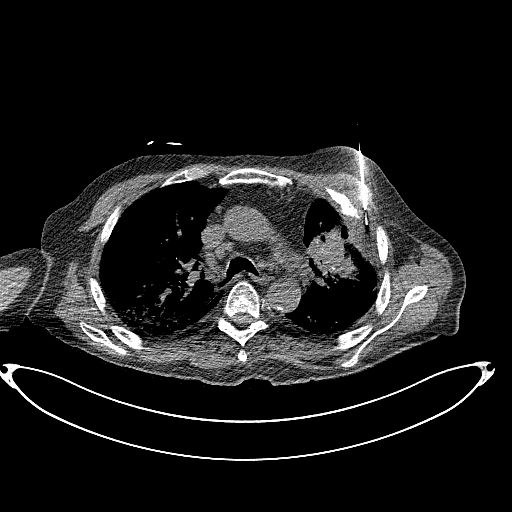
[im 42/60  bone]
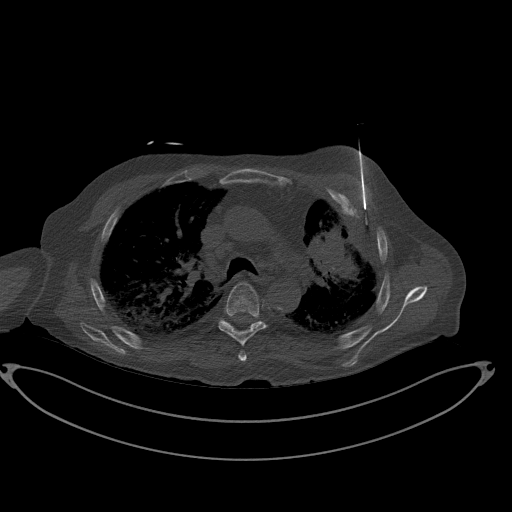
[im 46/60  soft-tissue]
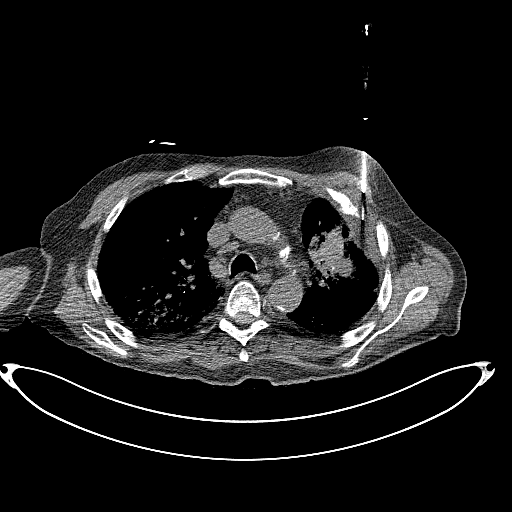
[im 52/60  soft-tissue]
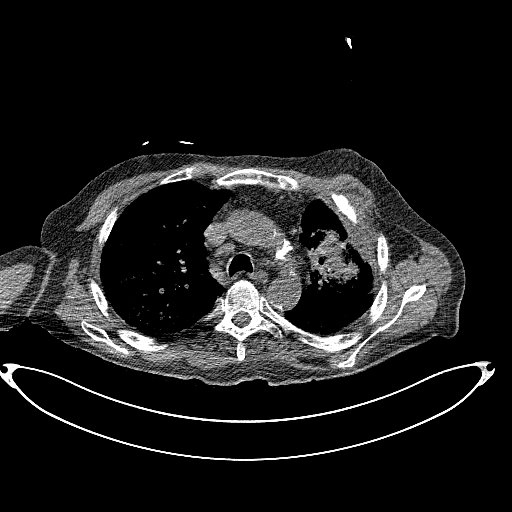
[im 52/60  lung]
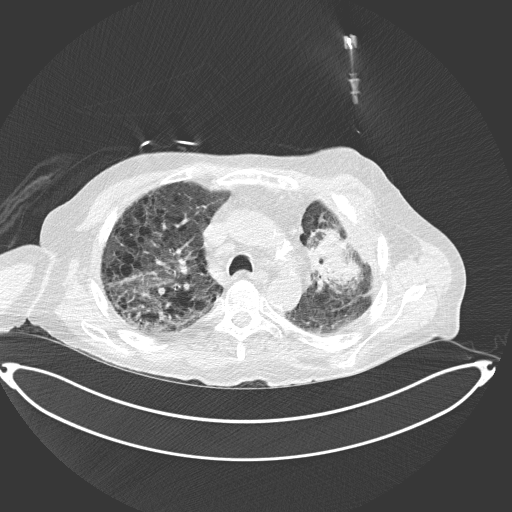
[im 54/60  lung]
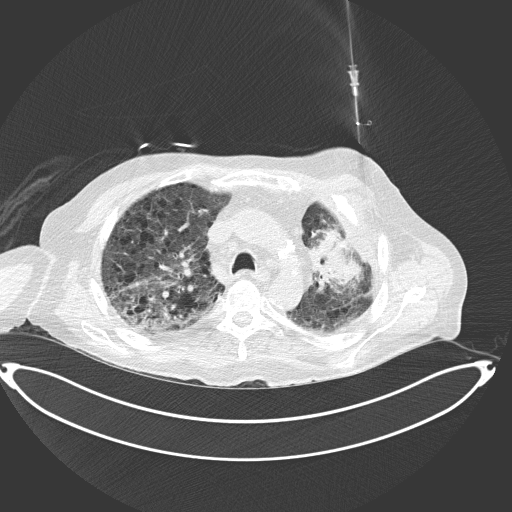
[im 56/60  soft-tissue]
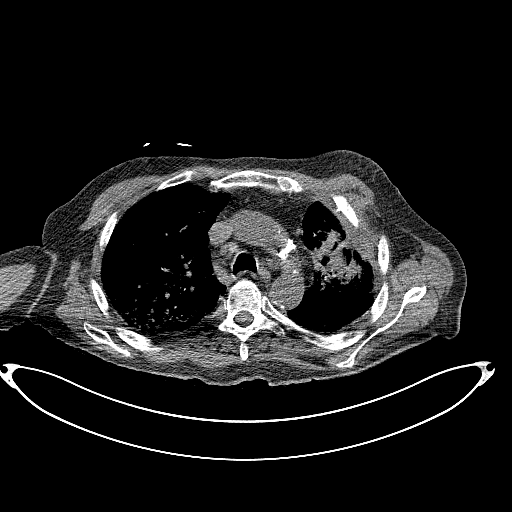
[im 56/60  lung]
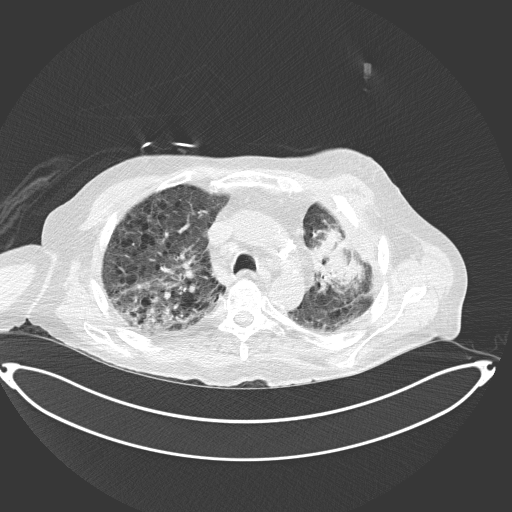
[im 58/60  lung]
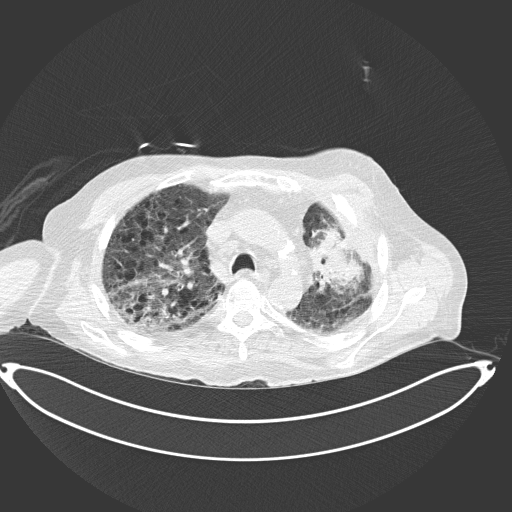

[14 of 32 positions shown; findings below may reference images not displayed]

EXAM:
CT-GUIDED CORE BIOPSY OF LEFT UPPER LOBE LESION

CT-GUIDED CORE BIOPSY OF LEFT CHEST WALL/PLEURAL THICKENING

MEDICATIONS:
None.

ANESTHESIA/SEDATION:
Moderate (conscious) sedation was employed during this procedure. A
total of Versed 0.5 mg and Fentanyl 25 mcg was administered
intravenously.

Moderate Sedation Time: 20 minutes. The patient's level of
consciousness and vital signs were monitored continuously by
radiology nursing throughout the procedure under my direct
supervision.

FLUOROSCOPY TIME:  None

COMPLICATIONS:
None immediate.

PROCEDURE:
Informed written consent was obtained from the patient after a
thorough discussion of the procedural risks, benefits and
alternatives. All questions were addressed. Maximal Sterile Barrier
Technique was utilized including caps, mask, sterile gowns, sterile
gloves, sterile drape, hand hygiene and skin antiseptic. A timeout
was performed prior to the initiation of the procedure.

Images through the chest were obtained. The left upper anterior
chest was prepped and draped in sterile fashion. Skin was
anesthetized with 1% lidocaine. Using CT guidance, 17 gauge coaxial
needle was directed into the left upper lobe lesion. Needle
placement was along the anterior aspect of the lesion. Two core
biopsies obtained with an 18 gauge device. Specimens placed in
formalin. The 17 gauge needle was removed without complication. The
skin above the initial biopsy site was anesthetized with 1%
lidocaine. A second incision was made. New 17 gauge needle was
directed into the left anterior chest wall/ pleural thickening. The
biopsy needle was directed towards the area which was hypermetabolic
based on the previous PET-CT. The first 3 core biopsies yielded no
tissue. The needle was redirected into the pleural thickening and a
new core biopsy needle was obtained. Four additional core biopsies
were obtained of the chest wall thickening. Specimens placed in
formalin. 17 gauge needle was removed without complication. Bandage
placed over the puncture site.
FINDINGS: Extensive pleural-parenchymal thickening throughout the left upper
chest. The lesion along the inferior aspect of the parenchymal
disease was biopsied. Core biopsies were also obtained of the chest
wall/pleural thickening in the anterior left upper chest. No
evidence for parenchymal hemorrhage or pneumothorax following the
core biopsies.
IMPRESSION: Successful CT-guided core biopsy of the left upper lobe lesion.

Successful CT-guided core biopsy of the left anterior chest
wall/pleural thickening.

## 2018-02-27 IMAGING — CT CT BIOPSY
1 series · 1 of 1 positions shown · non-contrast
Comparison: none

INDICATION: 80-year-old with history of left lung cancer and status post
radiation treatment. Recent PET CT raised concern for recurrence in
the left upper lobe and along the left chest wall. Request for
biopsy of both areas.

[Series 1: topogram 0.6 t20f · coronal · 1.00mm/px · 1 of 1 slices shown]
[im 1/1]
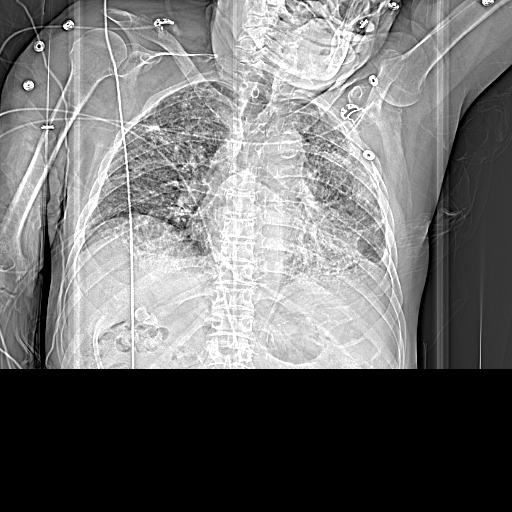

[1 of 1 positions shown; findings below may reference images not displayed]

EXAM:
CT-GUIDED CORE BIOPSY OF LEFT UPPER LOBE LESION

CT-GUIDED CORE BIOPSY OF LEFT CHEST WALL/PLEURAL THICKENING

MEDICATIONS:
None.

ANESTHESIA/SEDATION:
Moderate (conscious) sedation was employed during this procedure. A
total of Versed 0.5 mg and Fentanyl 25 mcg was administered
intravenously.

Moderate Sedation Time: 20 minutes. The patient's level of
consciousness and vital signs were monitored continuously by
radiology nursing throughout the procedure under my direct
supervision.

FLUOROSCOPY TIME:  None

COMPLICATIONS:
None immediate.

PROCEDURE:
Informed written consent was obtained from the patient after a
thorough discussion of the procedural risks, benefits and
alternatives. All questions were addressed. Maximal Sterile Barrier
Technique was utilized including caps, mask, sterile gowns, sterile
gloves, sterile drape, hand hygiene and skin antiseptic. A timeout
was performed prior to the initiation of the procedure.

Images through the chest were obtained. The left upper anterior
chest was prepped and draped in sterile fashion. Skin was
anesthetized with 1% lidocaine. Using CT guidance, 17 gauge coaxial
needle was directed into the left upper lobe lesion. Needle
placement was along the anterior aspect of the lesion. Two core
biopsies obtained with an 18 gauge device. Specimens placed in
formalin. The 17 gauge needle was removed without complication. The
skin above the initial biopsy site was anesthetized with 1%
lidocaine. A second incision was made. New 17 gauge needle was
directed into the left anterior chest wall/ pleural thickening. The
biopsy needle was directed towards the area which was hypermetabolic
based on the previous PET-CT. The first 3 core biopsies yielded no
tissue. The needle was redirected into the pleural thickening and a
new core biopsy needle was obtained. Four additional core biopsies
were obtained of the chest wall thickening. Specimens placed in
formalin. 17 gauge needle was removed without complication. Bandage
placed over the puncture site.
FINDINGS: Extensive pleural-parenchymal thickening throughout the left upper
chest. The lesion along the inferior aspect of the parenchymal
disease was biopsied. Core biopsies were also obtained of the chest
wall/pleural thickening in the anterior left upper chest. No
evidence for parenchymal hemorrhage or pneumothorax following the
core biopsies.
IMPRESSION: Successful CT-guided core biopsy of the left upper lobe lesion.

Successful CT-guided core biopsy of the left anterior chest
wall/pleural thickening.

## 2018-03-03 IMAGING — CR DG CHEST 2V
2 series · 2 of 2 positions shown · non-contrast
Comparison: 12/31/2015

CLINICAL DATA: History of lung cancer in 8622. Preoperative for
lung biopsy.

EXAM:
CHEST  2 VIEW

[w chest pa]
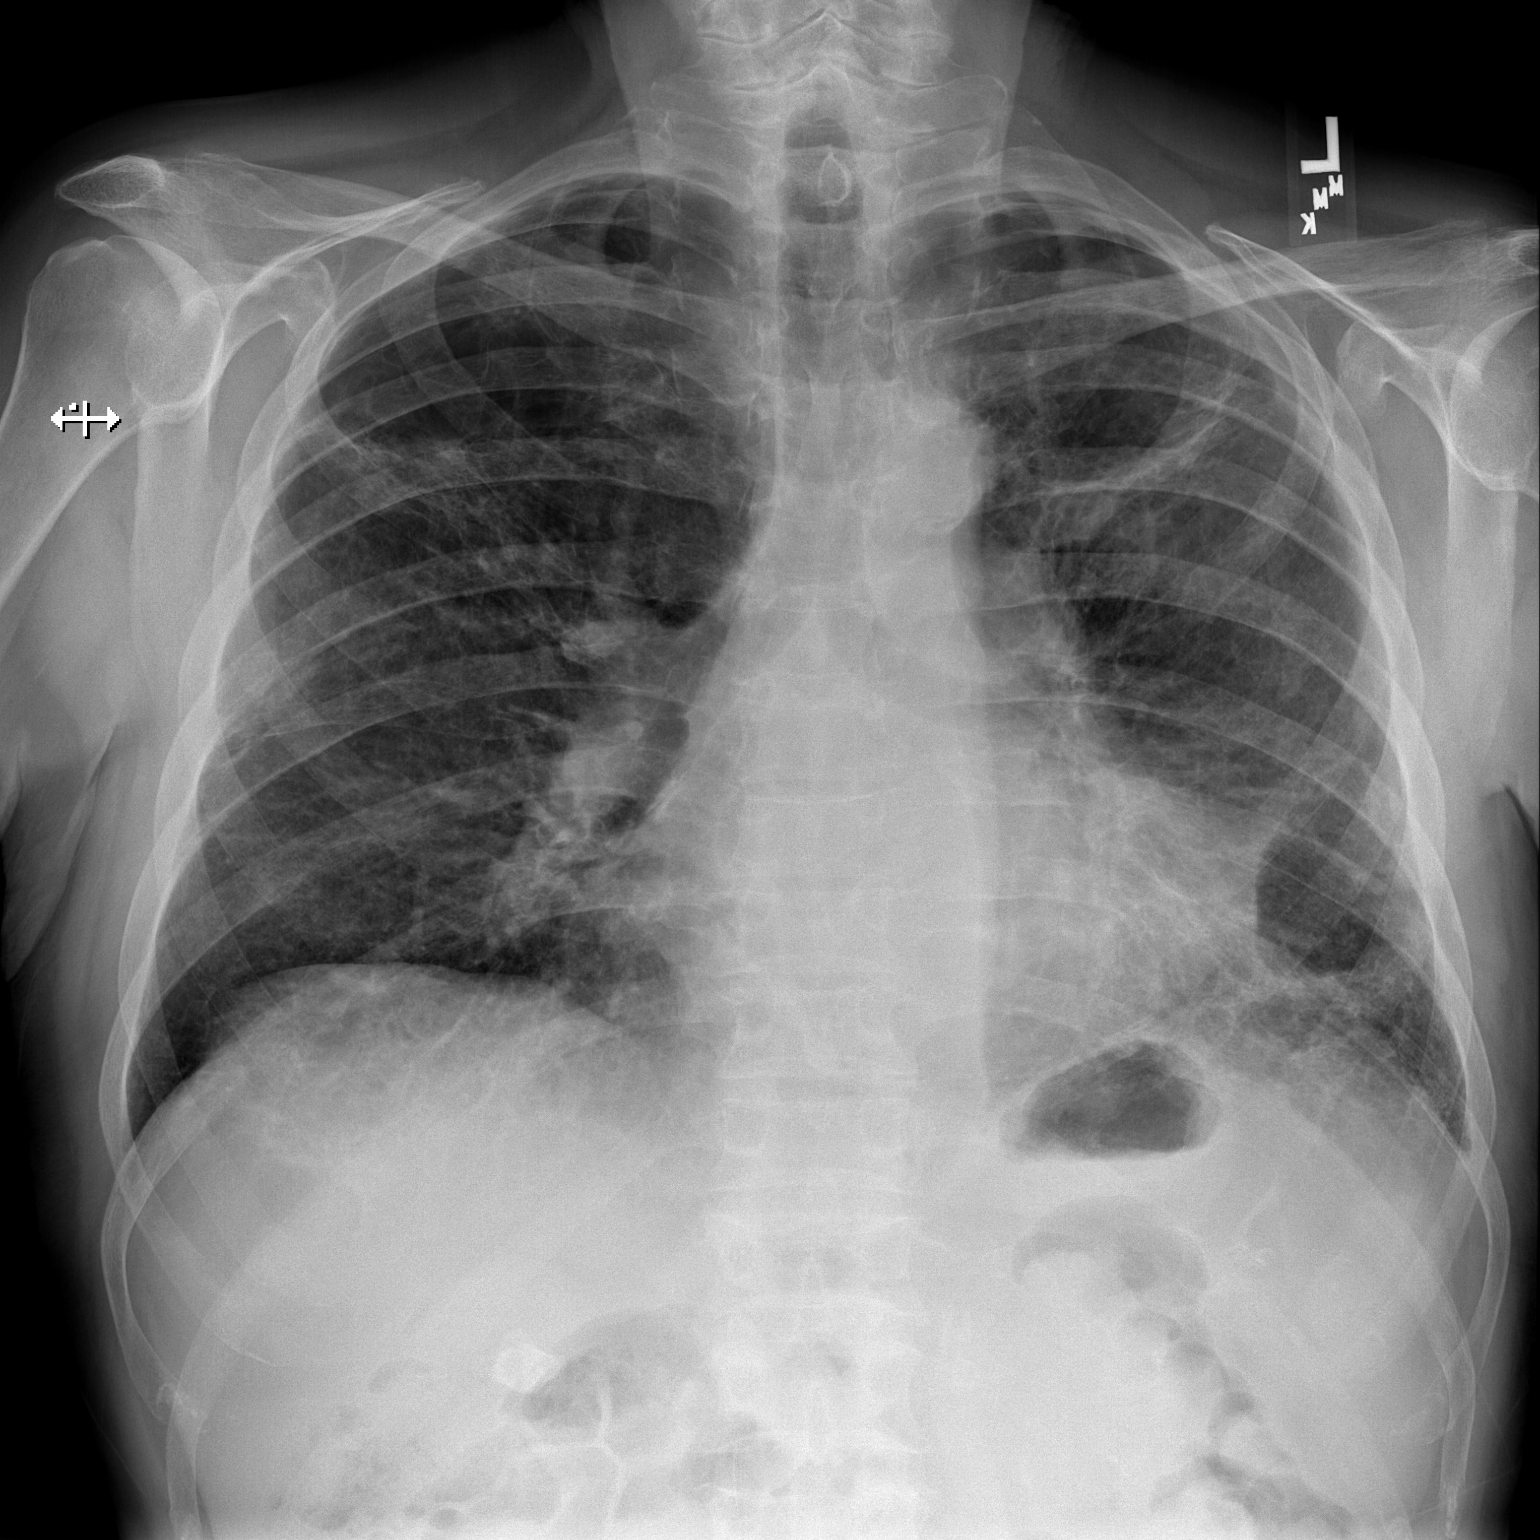

[w chest lat]
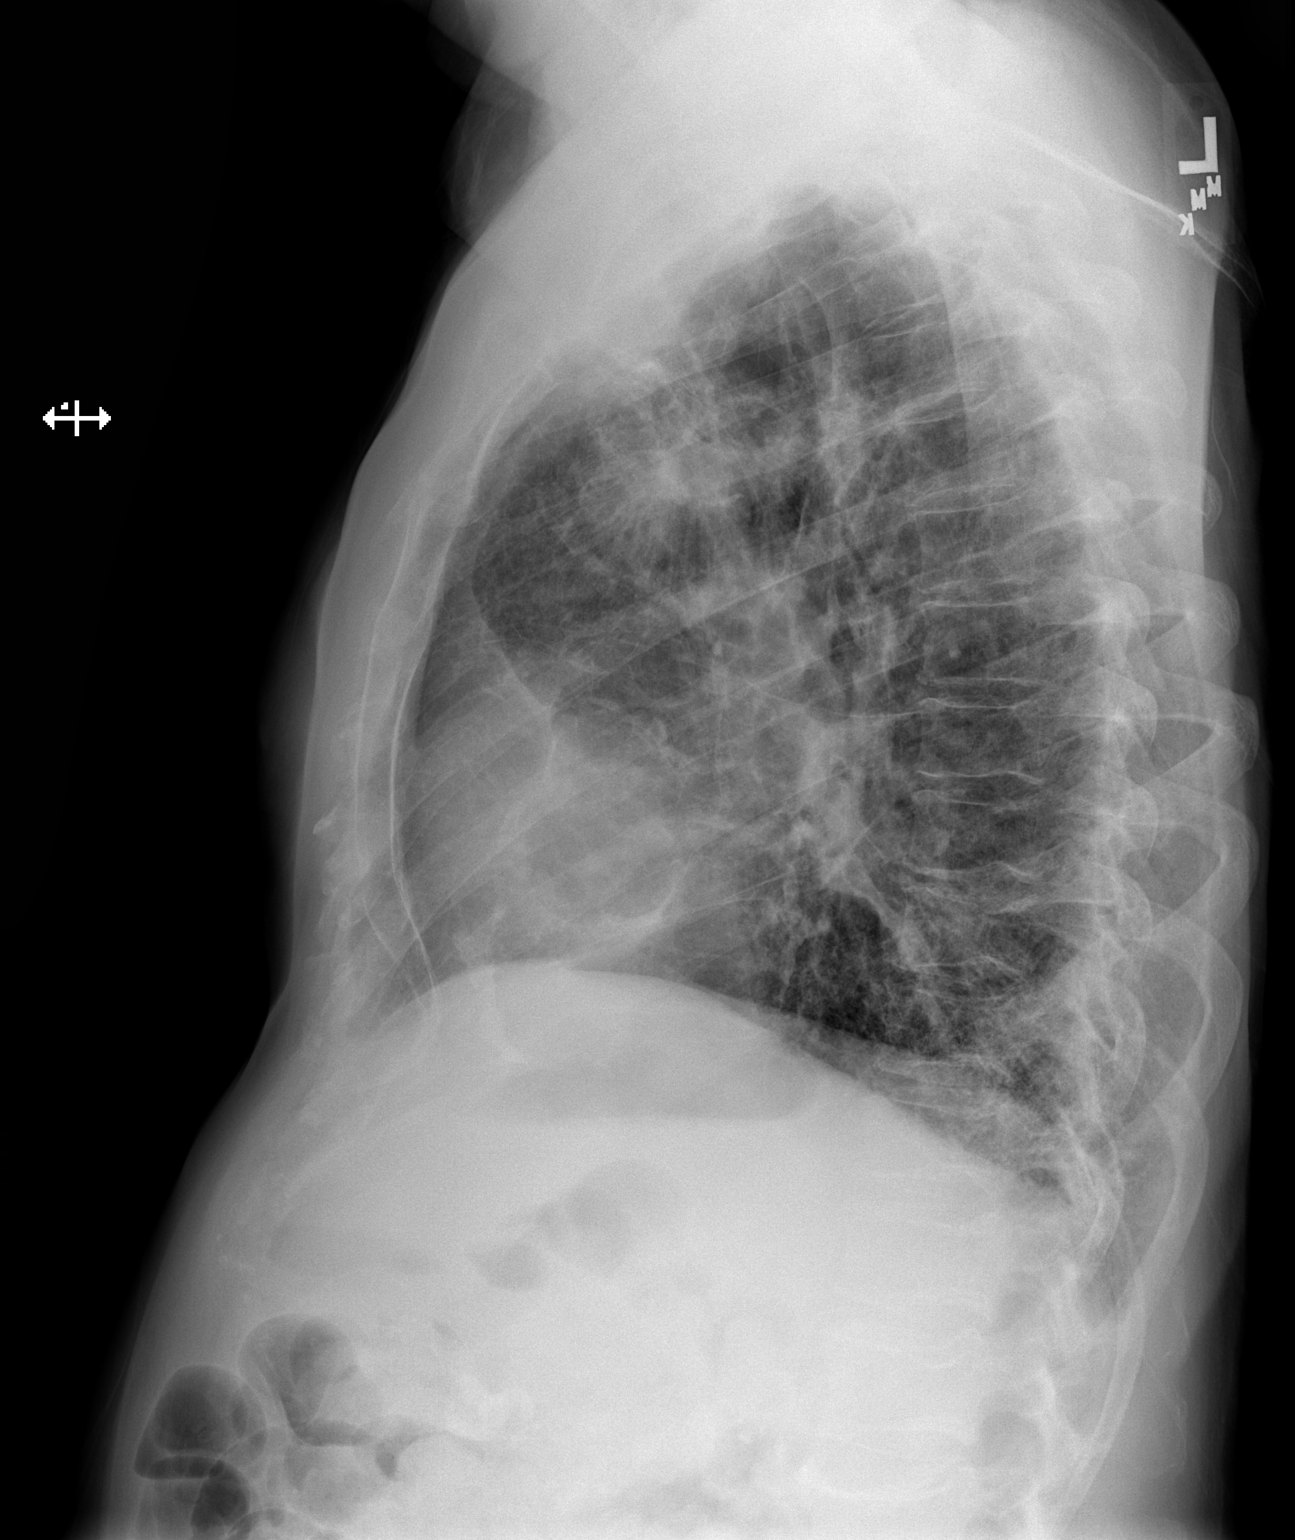

[2 of 2 positions shown; findings below may reference images not displayed]

FINDINGS: Cardiomediastinal silhouette is stably enlarged. Mediastinal
contours appear intact. Atherosclerotic disease of the aorta.

There is no evidence of pneumothorax. Chronic coarsening of the
interstitial markings. Left upper lobe pulmonary mass is better seen
on recent chest CT. No appreciable pleural effusion.

Osseous structures are without acute abnormality. Soft tissues are
grossly normal.
IMPRESSION: Chronic interstitial lung disease.

Left upper lobe mass poorly visualized.
